# Patient Record
Sex: Male | Born: 1970
Health system: Southern US, Community
[De-identification: ages and names within clinical notes are randomized; demographics above are authoritative.]

## PROBLEM LIST (undated history)

## (undated) DIAGNOSIS — D649 Anemia, unspecified: Secondary | ICD-10-CM

## (undated) DIAGNOSIS — I2699 Other pulmonary embolism without acute cor pulmonale: Secondary | ICD-10-CM

## (undated) DIAGNOSIS — K219 Gastro-esophageal reflux disease without esophagitis: Secondary | ICD-10-CM

## (undated) DIAGNOSIS — E119 Type 2 diabetes mellitus without complications: Secondary | ICD-10-CM

## (undated) DIAGNOSIS — R6889 Other general symptoms and signs: Secondary | ICD-10-CM

## (undated) DIAGNOSIS — R7303 Prediabetes: Secondary | ICD-10-CM

## (undated) DIAGNOSIS — D759 Disease of blood and blood-forming organs, unspecified: Secondary | ICD-10-CM

## (undated) DIAGNOSIS — M1990 Unspecified osteoarthritis, unspecified site: Secondary | ICD-10-CM

## (undated) DIAGNOSIS — I82409 Acute embolism and thrombosis of unspecified deep veins of unspecified lower extremity: Secondary | ICD-10-CM

## (undated) DIAGNOSIS — R51 Headache: Secondary | ICD-10-CM

## (undated) DIAGNOSIS — M5430 Sciatica, unspecified side: Secondary | ICD-10-CM

## (undated) HISTORY — PX: KNEE ARTHROSCOPY: SUR90

## (undated) HISTORY — PX: OTHER SURGICAL HISTORY: SHX169

---

## 2001-10-06 ENCOUNTER — Encounter: Payer: Self-pay | Admitting: Emergency Medicine

## 2001-10-06 ENCOUNTER — Emergency Department (HOSPITAL_COMMUNITY): Admission: EM | Admit: 2001-10-06 | Discharge: 2001-10-06 | Payer: Self-pay | Admitting: Emergency Medicine

## 2002-10-05 ENCOUNTER — Emergency Department (HOSPITAL_COMMUNITY): Admission: EM | Admit: 2002-10-05 | Discharge: 2002-10-05 | Payer: Self-pay | Admitting: Emergency Medicine

## 2002-10-05 ENCOUNTER — Encounter: Payer: Self-pay | Admitting: Emergency Medicine

## 2005-02-02 ENCOUNTER — Ambulatory Visit (HOSPITAL_COMMUNITY): Admission: RE | Admit: 2005-02-02 | Discharge: 2005-02-02 | Payer: Self-pay | Admitting: Family Medicine

## 2005-03-07 ENCOUNTER — Ambulatory Visit (HOSPITAL_COMMUNITY): Admission: RE | Admit: 2005-03-07 | Discharge: 2005-03-07 | Payer: Self-pay | Admitting: Orthopedic Surgery

## 2005-03-07 ENCOUNTER — Ambulatory Visit (HOSPITAL_BASED_OUTPATIENT_CLINIC_OR_DEPARTMENT_OTHER): Admission: RE | Admit: 2005-03-07 | Discharge: 2005-03-07 | Payer: Self-pay | Admitting: Orthopedic Surgery

## 2005-07-04 ENCOUNTER — Ambulatory Visit (HOSPITAL_COMMUNITY): Admission: RE | Admit: 2005-07-04 | Discharge: 2005-07-04 | Payer: Self-pay | Admitting: Orthopedic Surgery

## 2005-07-04 ENCOUNTER — Ambulatory Visit (HOSPITAL_BASED_OUTPATIENT_CLINIC_OR_DEPARTMENT_OTHER): Admission: RE | Admit: 2005-07-04 | Discharge: 2005-07-04 | Payer: Self-pay | Admitting: Orthopedic Surgery

## 2007-09-05 ENCOUNTER — Emergency Department (HOSPITAL_COMMUNITY): Admission: EM | Admit: 2007-09-05 | Discharge: 2007-09-05 | Payer: Self-pay | Admitting: Emergency Medicine

## 2008-10-07 ENCOUNTER — Emergency Department (HOSPITAL_COMMUNITY): Admission: EM | Admit: 2008-10-07 | Discharge: 2008-10-07 | Payer: Self-pay | Admitting: Family Medicine

## 2009-11-09 ENCOUNTER — Emergency Department (HOSPITAL_COMMUNITY): Admission: EM | Admit: 2009-11-09 | Discharge: 2009-11-09 | Payer: Self-pay | Admitting: Emergency Medicine

## 2010-03-01 ENCOUNTER — Emergency Department (HOSPITAL_COMMUNITY): Admission: EM | Admit: 2010-03-01 | Discharge: 2010-03-01 | Payer: Self-pay | Admitting: Emergency Medicine

## 2010-03-01 ENCOUNTER — Observation Stay (HOSPITAL_COMMUNITY): Admission: EM | Admit: 2010-03-01 | Discharge: 2010-03-05 | Payer: Self-pay | Admitting: Emergency Medicine

## 2010-03-01 ENCOUNTER — Ambulatory Visit: Payer: Self-pay | Admitting: Cardiology

## 2010-03-02 ENCOUNTER — Encounter (INDEPENDENT_AMBULATORY_CARE_PROVIDER_SITE_OTHER): Payer: Self-pay

## 2010-03-04 ENCOUNTER — Ambulatory Visit: Payer: Self-pay | Admitting: Gastroenterology

## 2010-11-14 LAB — SODIUM, URINE, RANDOM: Sodium, Ur: <10 mEq/L

## 2010-11-14 LAB — BASIC METABOLIC PANEL
BUN: 4 mg/dL — ABNORMAL LOW (ref 6–23)
BUN: 6 mg/dL (ref 6–23)
Calcium: 8.1 mg/dL — ABNORMAL LOW (ref 8.4–10.5)
Calcium: 8.4 mg/dL (ref 8.4–10.5)
Calcium: 8.7 mg/dL (ref 8.4–10.5)
Creatinine, Ser: 1.14 mg/dL (ref 0.4–1.5)
Creatinine, Ser: 1.35 mg/dL (ref 0.4–1.5)
Creatinine, Ser: 2.3 mg/dL — ABNORMAL HIGH (ref 0.4–1.5)
GFR calc Af Amer: 39 mL/min — ABNORMAL LOW (ref >60)
GFR calc Af Amer: >60 mL/min (ref >60)
GFR calc Af Amer: >60 mL/min (ref >60)
GFR calc non Af Amer: 56 mL/min — ABNORMAL LOW (ref >60)
GFR calc non Af Amer: >60 mL/min (ref >60)
Glucose, Bld: 105 mg/dL — ABNORMAL HIGH (ref 70–99)
Potassium: 3.7 mEq/L (ref 3.5–5.1)
Sodium: 138 mEq/L (ref 135–145)

## 2010-11-14 LAB — GIARDIA/CRYPTOSPORIDIUM SCREEN(EIA)
Cryptosporidium Screen (EIA): NEGATIVE
Cryptosporidium Screen (EIA): NEGATIVE
Giardia Screen - EIA: NEGATIVE

## 2010-11-14 LAB — CBC
HCT: 50 % (ref 39.0–52.0)
Hemoglobin: 14.9 g/dL (ref 13.0–17.0)
Hemoglobin: 17 g/dL (ref 13.0–17.0)
MCH: 28.3 pg (ref 26.0–34.0)
MCHC: 34 g/dL (ref 30.0–36.0)
MCHC: 34 g/dL (ref 30.0–36.0)
MCV: 83.3 fL (ref 78.0–100.0)
Platelets: 354 10*3/uL (ref 150–400)
Platelets: 363 10*3/uL (ref 150–400)
RBC: 6 MIL/uL — ABNORMAL HIGH (ref 4.22–5.81)
RDW: 13.9 % (ref 11.5–15.5)
WBC: 9.2 10*3/uL (ref 4.0–10.5)

## 2010-11-14 LAB — DIFFERENTIAL
Basophils Absolute: 0 10*3/uL (ref 0.0–0.1)
Basophils Relative: 0 % (ref 0–1)
Eosinophils Absolute: 0 10*3/uL (ref 0.0–0.7)
Eosinophils Relative: 1 % (ref 0–5)
Lymphocytes Relative: 35 % (ref 12–46)
Lymphs Abs: 3.2 10*3/uL (ref 0.7–4.0)
Monocytes Absolute: 1.5 10*3/uL — ABNORMAL HIGH (ref 0.1–1.0)
Monocytes Relative: 16 % — ABNORMAL HIGH (ref 3–12)
Neutro Abs: 4.4 10*3/uL (ref 1.7–7.7)
Neutrophils Relative %: 48 % (ref 43–77)

## 2010-11-14 LAB — COMPREHENSIVE METABOLIC PANEL
ALT: 39 U/L (ref 0–53)
AST: 33 U/L (ref 0–37)
Albumin: 3.2 g/dL — ABNORMAL LOW (ref 3.5–5.2)
CO2: 25 mEq/L (ref 19–32)
Calcium: 8.5 mg/dL (ref 8.4–10.5)
GFR calc Af Amer: >60 mL/min (ref >60)
GFR calc non Af Amer: 52 mL/min — ABNORMAL LOW (ref >60)
Sodium: 136 mEq/L (ref 135–145)
Total Protein: 7.3 g/dL (ref 6.0–8.3)

## 2010-11-14 LAB — CARDIAC PANEL(CRET KIN+CKTOT+MB+TROPI)
Total CK: 310 U/L — ABNORMAL HIGH (ref 7–232)
Troponin I: 0.01 ng/mL (ref 0.00–0.06)

## 2010-11-14 LAB — CLOSTRIDIUM DIFFICILE EIA
C difficile Toxins A+B, EIA: NEGATIVE
C difficile Toxins A+B, EIA: NEGATIVE

## 2010-11-14 LAB — HEPATIC FUNCTION PANEL
ALT: 39 U/L (ref 0–53)
Alkaline Phosphatase: 56 U/L (ref 39–117)
Bilirubin, Direct: <0.1 mg/dL (ref 0.0–0.3)

## 2010-11-14 LAB — POCT I-STAT, CHEM 8
BUN: 23 mg/dL (ref 6–23)
Hemoglobin: 18.7 g/dL — ABNORMAL HIGH (ref 13.0–17.0)
Sodium: 131 mEq/L — ABNORMAL LOW (ref 135–145)
TCO2: 26 mmol/L (ref 0–100)

## 2011-01-03 ENCOUNTER — Emergency Department (HOSPITAL_COMMUNITY)
Admission: EM | Admit: 2011-01-03 | Discharge: 2011-01-03 | Disposition: A | Discharge status: Home / Self Care | Payer: BC Managed Care – PPO | Attending: Emergency Medicine | Admitting: Emergency Medicine

## 2011-01-03 DIAGNOSIS — R05 Cough: Secondary | ICD-10-CM | POA: Insufficient documentation

## 2011-01-03 DIAGNOSIS — R5381 Other malaise: Secondary | ICD-10-CM | POA: Insufficient documentation

## 2011-01-03 DIAGNOSIS — N390 Urinary tract infection, site not specified: Secondary | ICD-10-CM | POA: Insufficient documentation

## 2011-01-03 DIAGNOSIS — J069 Acute upper respiratory infection, unspecified: Secondary | ICD-10-CM | POA: Insufficient documentation

## 2011-01-03 DIAGNOSIS — R059 Cough, unspecified: Secondary | ICD-10-CM | POA: Insufficient documentation

## 2011-01-03 LAB — URINE MICROSCOPIC-ADD ON

## 2011-01-03 LAB — POCT I-STAT, CHEM 8
Chloride: 101 mEq/L (ref 96–112)
Glucose, Bld: 108 mg/dL — ABNORMAL HIGH (ref 70–99)
HCT: 48 % (ref 39.0–52.0)
Hemoglobin: 16.3 g/dL (ref 13.0–17.0)
Potassium: 4.2 mEq/L (ref 3.5–5.1)
Sodium: 138 mEq/L (ref 135–145)

## 2011-01-03 LAB — URINALYSIS, ROUTINE W REFLEX MICROSCOPIC
Glucose, UA: NEGATIVE mg/dL
Ketones, ur: NEGATIVE mg/dL
Protein, ur: NEGATIVE mg/dL
Urobilinogen, UA: 0.2 mg/dL (ref 0.0–1.0)

## 2011-01-04 LAB — URINE CULTURE

## 2011-01-14 NOTE — Op Note (Signed)
NAME:  Marcus Joseph, Marcus Joseph NO.:  [id number]   MEDICAL RECORD NO.:  [id number]          PATIENT TYPE:  AMB   LOCATION:  DSC                          FACILITY:  [facility]   PHYSICIAN:  Feliberto Gottron. Turner Daniels, M.D.   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   PROCEDURE:  Right knee arthroscopic removal loose bodies and chondromalacia  from the trochlea grade 3 with flap tears. Medial femoral condyle also grade  3 focal.   SURGEON:  Feliberto Gottron. Turner Daniels, M.D.   ASSISTANT:  None.   ANESTHETIC:  General LMA with Marcaine local.   ESTIMATED BLOOD LOSS:  Minimal.   FLUID REPLACEMENT:  800 mL crystalloid.   DRAINS PLACED:  None.   TOURNIQUET TIME:  None.   INDICATIONS FOR PROCEDURE:  A 40 year old gentleman has previously had knee  arthroscopy a few years ago and has recurrent pain, catching and popping in  his right knee and has failed conservative treatment with anti-inflammatory  medicines, physical therapy and activity modification. He is thought to have  loose bodies in his knee based on the mechanical catching, locking that has  been persistent over many months and desires elective arthroscopic  evaluation and treatment of his right knee. Risks and benefits of surgery  discussed previous to the procedure and all questions answered.   DESCRIPTION OF PROCEDURE:  The patient identified by armband, taken to the  operating room at [facility] where the appropriate anesthetic  monitors were attached. General LMA anesthesia induced with the patient in  supine position. Lateral posts applied to the table. Right lower extremity  prepped and draped in usual sterile fashion from the ankle to the midthigh.  Using the scars  from previous arthroscopy, standard portals were made 1.5 cm  inferomedial, inferolateral to the patella allowing introduction of the  arthroscope through the inferolateral portal and the outflow through the  inferomedial portal. We immediately encountered multiple cartilaginous loose  bodies somewhat to a centimeter in size in the joint fluid and these were  taken through the outflow and also piecemeal with 3.5 gator sucker shaver or  the pituitary rongeurs. The knee was continuously irrigated out. I would  estimate we took out about 20 loose bodies. The donor site was the trochlea  with flap tears. This was debrided back to stable margin with 3.5 gator  sucker shaver as was the distal aspect of medial femoral condyle. The  gutters were cleared medial and laterally. The ACL and PCL were intact. The  menisci were also  grossly intact. They were probed and found to be intact. At this point the  knee was irrigated out with normal saline solution. The arthroscopic  instruments removed and a dressing of Xeroform, 4x4 dressing, sponges,  Webril and Ace wrap applied. The patient was awakened and taken to the  recovery room without difficulty.  Feliberto Gottron. Turner Daniels, M.D.  Electronically Signed     FJR/MEDQ  D:  07/04/2005  T:  07/04/2005  Job:  [id number]

## 2011-01-14 NOTE — Op Note (Signed)
NAME:  Marcus Joseph, Marcus Joseph NO.:  [id number]   MEDICAL RECORD NO.:  [id number]          PATIENT TYPE:  AMB   LOCATION:  DSC                          FACILITY:  [facility]   PHYSICIAN:  Feliberto Gottron. Turner Daniels, M.D.   DATE OF BIRTH:  09-23-1970   DATE OF PROCEDURE:  03/07/2005  DATE OF DISCHARGE:                                 OPERATIVE REPORT   PREOPERATIVE DIAGNOSIS:  Right knee chondromalacia versus medial meniscal  tear.   POSTOPERATIVE DIAGNOSES:  1.  Right knee chondromalacia, trochlea grade 3, with flap tears; medial      femoral condyle, grade 3 flap tears.  2.  Multiple cartilaginous loose bodies.   PROCEDURE:  Right knee arthroscopic removal of loose bodies and debridement  of chondromalacia.   SURGEON:  Feliberto Gottron. Turner Daniels, M.D.   FIRST ASSISTANT:  Erskine Squibb B. Jannet Mantis.   ANESTHETIC:  Local with general LMA.   ESTIMATED BLOOD LOSS:  Minimal.   FLUID REPLACEMENT:  700 mL of crystalloid.   DRAINS PLACED:  None.   TOURNIQUET TIME:  None.   INDICATIONS FOR PROCEDURE:  A 40 year old man with significant catching,  popping and pain in his right knee, who has failed conservative treatment  with anti-inflammatory medicines, cortisone injection and exercise.  Because  of the persistent pain, swelling, catching and popping, a possible meniscal  tear on the MRI scan versus loose bodies, definite chondromalacia with an  effusion, he desires elective arthroscopic evaluation and treatment of his  right knee.  Risks and benefits of surgery discussed with him  preoperatively, questions were answered.   DESCRIPTION OF PROCEDURE:  The patient identified by armband, taken to the  operating room at [facility].  Appropriate anesthetic monitors  were attached and local anesthesia with general LMA anesthesia was induced  with the patient in a supine position.  A lateral post applied to the table  and the right lower extremity prepped and draped in the usual sterile  fashion from the ankle to the midthigh.  Using a #11 blade, standard  inferomedial, inferolateral peripatellar portals were then made allowing  introduction the arthroscope through the inferolateral portal and the  outflow through the inferomedial portal.  We immediately encountered  multiple cartilaginous loose bodies, which were continuously removed with  the pump throughout the procedure.  We identified chondromalacia of the  trochlea, grade 3 flap tears, which was debrided back to a stable margin  with a 3.5 gator sucker shaver as well as less impressive chondromalacia of  the patella.  Moving to the medial compartment, the medial meniscus was  probed and found to be intact.  Four or five more loose bodies were found,  some of them underneath the medial meniscus.  These were removed and  chondromalacia, grade 3 with flap tears, of the posterior aspect of the  medial femoral condyle was identified and debrided as well.  The trochlea  and the posterior medial femoral condyle were the donor sites for the loose  bodies.  The ACL and the PCL were intact.  The lateral compartment was in  excellent condition except  for two more loose bodies, which were  photographed and removed.  We found another one in the lateral gutter.  This  was also removed.  Most of these could be taken through the outflow.  The  knee was then thoroughly irrigated out with normal saline solution.  More  photographs were made documenting removal of the loose bodies, and the  arthroscopic instruments were then removed.  A dressing of Xeroform. 4x4  dressing sponges, Webril and an Ace wrap applied.  The patient was then  awakened and taken to the recovery room without difficulty.       FJR/MEDQ  D:  03/07/2005  T:  03/07/2005  Job:  [id number]

## 2011-09-07 ENCOUNTER — Encounter (HOSPITAL_COMMUNITY): Payer: Self-pay

## 2011-09-07 ENCOUNTER — Emergency Department (HOSPITAL_COMMUNITY)
Admission: EM | Admit: 2011-09-07 | Discharge: 2011-09-07 | Disposition: A | Discharge status: Home / Self Care | Payer: BC Managed Care – PPO | Source: Home / Self Care | Attending: Family Medicine | Admitting: Family Medicine

## 2011-09-07 DIAGNOSIS — H6990 Unspecified Eustachian tube disorder, unspecified ear: Secondary | ICD-10-CM

## 2011-09-07 DIAGNOSIS — H6980 Other specified disorders of Eustachian tube, unspecified ear: Secondary | ICD-10-CM

## 2011-09-07 MED ORDER — FLUTICASONE PROPIONATE 50 MCG/ACT NA SUSP: 2.0000 | Freq: Every day | NASAL | Status: DC

## 2011-09-07 NOTE — ED Notes (Signed)
C/o recent URI, complicated by ear pressure, fullness, and dizziness; minimal relief w OTC meds

## 2011-09-07 NOTE — ED Provider Notes (Signed)
History     CSN: [id number]  Arrival date & time 09/07/11  1031   First MD Initiated Contact with Patient 09/07/11 1146      Chief Complaint  Patient presents with  . Ear Fullness    (Consider location/radiation/quality/duration/timing/severity/associated sxs/prior treatment) HPI Comments: Uri presents for evaluation of bilateral ear fullness over the last week. He has tried OTC preparations such as Alka-Seltzer, Zicam, and Sudafed without relief. He denies any hx of seasonal allergies.   Patient is a 41 y.o. male presenting with plugged ear sensation. The history is provided by the patient.  Ear Fullness This is a new problem. The current episode started more than 2 days ago. The problem occurs constantly. The problem has not changed since onset.The symptoms are aggravated by nothing. The symptoms are relieved by nothing. The treatment provided mild relief.    History reviewed. No pertinent past medical history.  History reviewed. No pertinent past surgical history.  History reviewed. No pertinent family history.  History  Substance Use Topics  . Smoking status: Never Smoker   . Smokeless tobacco: Not on file  . Alcohol Use: Yes      Review of Systems  Constitutional: Negative.   HENT: Positive for hearing loss and congestion. Negative for ear pain and ear discharge.   Eyes: Negative.   Respiratory: Negative.   Cardiovascular: Negative.   Gastrointestinal: Negative.   Genitourinary: Negative.   Musculoskeletal: Negative.   Skin: Negative.   Neurological: Negative.     Allergies  Review of patient's allergies indicates no known allergies.  Home Medications   Current Outpatient Rx  Name Route Sig Dispense Refill  . FLUTICASONE PROPIONATE 50 MCG/ACT NA SUSP Nasal Place 2 sprays into the nose daily. 16 g 2    BP 127/72  Pulse 89  Temp(Src) 98.3 F (36.8 C) (Oral)  Resp 18  SpO2 99%  Physical Exam  Nursing note and vitals reviewed. Constitutional:  He is oriented to person, place, and time. He appears well-developed and well-nourished.  HENT:  Head: Normocephalic and atraumatic.  Right Ear: Tympanic membrane is retracted. Tympanic membrane is not erythematous.  Left Ear: Tympanic membrane is retracted. Tympanic membrane is not erythematous.  Mouth/Throat: Uvula is midline, oropharynx is clear and moist and mucous membranes are normal.  Eyes: EOM are normal.  Neck: Normal range of motion.  Pulmonary/Chest: Effort normal and breath sounds normal. He has no wheezes. He has no rhonchi.  Musculoskeletal: Normal range of motion.  Neurological: He is alert and oriented to person, place, and time.  Skin: Skin is warm and dry.  Psychiatric: His behavior is normal.    ED Course  Procedures (including critical care time)  Labs Reviewed - No data to display No results found.   1. Eustachian tube dysfunction       MDM  rx given for fluticasone        Richardo Priest, MD 09/07/11 1213

## 2012-08-06 ENCOUNTER — Emergency Department (HOSPITAL_COMMUNITY)
Admission: EM | Admit: 2012-08-06 | Discharge: 2012-08-06 | Disposition: A | Discharge status: Home / Self Care | Payer: BC Managed Care – PPO | Source: Home / Self Care | Attending: Emergency Medicine | Admitting: Emergency Medicine

## 2012-08-06 ENCOUNTER — Emergency Department (INDEPENDENT_AMBULATORY_CARE_PROVIDER_SITE_OTHER): Payer: BC Managed Care – PPO

## 2012-08-06 ENCOUNTER — Encounter (HOSPITAL_COMMUNITY): Payer: Self-pay | Admitting: Emergency Medicine

## 2012-08-06 DIAGNOSIS — M25569 Pain in unspecified knee: Secondary | ICD-10-CM

## 2012-08-06 DIAGNOSIS — M25562 Pain in left knee: Secondary | ICD-10-CM

## 2012-08-06 MED ORDER — MELOXICAM 7.5 MG PO TABS: 7.5000 mg | ORAL_TABLET | Freq: Every day | ORAL | Status: DC

## 2012-08-06 MED ORDER — HYDROCODONE-ACETAMINOPHEN 5-325 MG PO TABS: 2.0000 | ORAL_TABLET | ORAL | Status: DC | PRN

## 2012-08-06 NOTE — ED Notes (Addendum)
Bilateral knee pain x 3 weeks, scale of 8 with knee braces and 10 w/o braces... Pain keeps him up at night and wears braces to help sleep. Patient taking ibuprofen without relief. bp

## 2012-08-06 NOTE — ED Provider Notes (Signed)
History     CSN: [id number]  Arrival date & time 08/06/12  1107   First MD Initiated Contact with Patient 08/06/12 1157      Chief Complaint  Patient presents with  . Knee Pain    (Consider location/radiation/quality/duration/timing/severity/associated sxs/prior treatment) Patient is a 41 y.o. male presenting with knee pain. The history is provided by the patient. No language interpreter was used.  Knee Pain This is a new problem. Episode onset: 3 weeks. The problem occurs constantly. The problem has been gradually worsening. The symptoms are aggravated by walking. Nothing relieves the symptoms. He has tried rest for the symptoms. The treatment provided no relief.   Pt has had problems with both knees.  Pt reports left knee is swollen and painful.   Pt reports he has been wearing a knee brace without relief History reviewed. No pertinent past medical history.  Past Surgical History  Procedure Date  . Knee arthroscopy     No family history on file.  History  Substance Use Topics  . Smoking status: Never Smoker   . Smokeless tobacco: Not on file  . Alcohol Use: Yes      Review of Systems  Musculoskeletal: Positive for myalgias and joint swelling.  All other systems reviewed and are negative.    Allergies  Review of patient's allergies indicates no known allergies.  Home Medications   Current Outpatient Rx  Name  Route  Sig  Dispense  Refill  . IBUPROFEN 100 MG PO TABS   Oral   Take 100 mg by mouth every 6 (six) hours as needed.         Marland Kitchen FLUTICASONE PROPIONATE 50 MCG/ACT NA SUSP   Nasal   Place 2 sprays into the nose daily.   16 g   2     BP 144/85  Pulse 81  Temp 98.8 F (37.1 C) (Oral)  Resp 20  SpO2 99%  Physical Exam  Nursing note and vitals reviewed. Constitutional: He is oriented to person, place, and time. He appears well-developed and well-nourished.  HENT:  Head: Normocephalic and atraumatic.  Eyes: Pupils are equal, round, and  reactive to light.  Musculoskeletal: He exhibits tenderness.       Swollen left knee, decreased range of motion,  nv and ns intact  No instability  Neurological: He is alert and oriented to person, place, and time. He has normal reflexes.  Skin: Skin is warm.  Psychiatric: He has a normal mood and affect.    ED Course  Procedures (including critical care time)  Labs Reviewed - No data to display Dg Knee Complete 4 Views Left  08/06/2012  *RADIOLOGY REPORT*  Clinical Data: Left knee injury and pain.  LEFT KNEE - COMPLETE 4+ VIEW  Comparison: None.  Findings: No evidence of acute fracture or dislocation.  No definite evidence of knee joint effusion.  Mild osteoarthritis is seen involving the medial and patellofemoral compartments.  No other significant bone abnormality identified.  IMPRESSION:  1.  No acute findings. 2.  Mild medial and patellofemoral compartment osteoarthritis.   Original Report Authenticated By: Myles Rosenthal, M.D.      1. Knee pain, left       MDM  Pt advised to follow up with Dr. Rayburn Ma,  Wear brace,  meloxicam and hydrocodone for symptoms        Elson Areas, [place] 08/06/12 1328

## 2012-08-06 NOTE — ED Provider Notes (Signed)
Medical screening examination/treatment/procedure(s) were performed by non-physician practitioner and as supervising physician I was immediately available for consultation/collaboration.  Leslee Home, M.D.   Reuben Likes, MD 08/06/12 [phone]

## 2012-08-17 ENCOUNTER — Other Ambulatory Visit: Payer: Self-pay | Admitting: Orthopaedic Surgery

## 2012-08-17 DIAGNOSIS — M25562 Pain in left knee: Secondary | ICD-10-CM

## 2012-08-17 DIAGNOSIS — R531 Weakness: Secondary | ICD-10-CM

## 2012-08-25 ENCOUNTER — Ambulatory Visit
Admission: RE | Admit: 2012-08-25 | Discharge: 2012-08-25 | Disposition: A | Discharge status: Home / Self Care | Payer: BC Managed Care – PPO | Source: Ambulatory Visit | Attending: Orthopaedic Surgery | Admitting: Orthopaedic Surgery

## 2012-08-25 DIAGNOSIS — M25562 Pain in left knee: Secondary | ICD-10-CM

## 2012-08-25 DIAGNOSIS — R531 Weakness: Secondary | ICD-10-CM

## 2012-09-13 ENCOUNTER — Encounter (HOSPITAL_COMMUNITY): Payer: Self-pay | Admitting: Emergency Medicine

## 2012-09-13 ENCOUNTER — Emergency Department (INDEPENDENT_AMBULATORY_CARE_PROVIDER_SITE_OTHER)
Admission: EM | Admit: 2012-09-13 | Discharge: 2012-09-13 | Disposition: A | Discharge status: Home / Self Care | Payer: BC Managed Care – PPO | Source: Home / Self Care | Attending: Emergency Medicine | Admitting: Emergency Medicine

## 2012-09-13 DIAGNOSIS — L03221 Cellulitis of neck: Secondary | ICD-10-CM

## 2012-09-13 DIAGNOSIS — L0211 Cutaneous abscess of neck: Secondary | ICD-10-CM

## 2012-09-13 MED ORDER — DOXYCYCLINE HYCLATE 100 MG PO CAPS: 100.0000 mg | ORAL_CAPSULE | Freq: Two times a day (BID) | ORAL | Status: AC

## 2012-09-13 NOTE — ED Notes (Signed)
Pt c/o boil on back of neck x 3 days. Hot to touch. Denies any drainage. Pt states that he tried to drain it but nothing came out. Pt using ibuprofen for pain with mild relief.

## 2012-09-13 NOTE — ED Provider Notes (Signed)
History     CSN: [id number]  Arrival date & time 09/13/12  1606   First MD Initiated Contact with Patient 09/13/12 1607      Chief Complaint  Patient presents with  . Recurrent Skin Infections    boil on back of neck x 3 days. swelling. denies drainage. hot to touch.     (Consider location/radiation/quality/duration/timing/severity/associated sxs/prior treatment) HPI Comments: Patient presents urgent care this evening complaining of develop a ball in the back of his neck for the last 3-4 days. Is hot and tender to touch. He denies any spontaneous drainage although he has been trying to drain it by exerting a lot of pressure on it but nothing comes out. He has been taking some Motrin for the pain and swelling. Denies any fevers or chills denies any previous infections in the same area before.  The history is provided by the patient.    History reviewed. No pertinent past medical history.  Past Surgical History  Procedure Date  . Knee arthroscopy     History reviewed. No pertinent family history.  History  Substance Use Topics  . Smoking status: Never Smoker   . Smokeless tobacco: Not on file  . Alcohol Use: Yes     Comment: occasional      Review of Systems  Constitutional: Negative for fever, chills, activity change, appetite change and fatigue.  Skin: Positive for color change and wound.    Allergies  Review of patient's allergies indicates no known allergies.  Home Medications   Current Outpatient Rx  Name  Route  Sig  Dispense  Refill  . IBUPROFEN 100 MG PO TABS   Oral   Take 100 mg by mouth every 6 (six) hours as needed.         Marland Kitchen DOXYCYCLINE HYCLATE 100 MG PO CAPS   Oral   Take 1 capsule (100 mg total) by mouth 2 (two) times daily.   20 capsule   0   . FLUTICASONE PROPIONATE 50 MCG/ACT NA SUSP   Nasal   Place 2 sprays into the nose daily.   16 g   2   . HYDROCODONE-ACETAMINOPHEN 5-325 MG PO TABS   Oral   Take 2 tablets by mouth every 4  (four) hours as needed for pain.   16 tablet   0   . MELOXICAM 7.5 MG PO TABS   Oral   Take 1 tablet (7.5 mg total) by mouth daily.   14 tablet   1     BP 145/83  Pulse 75  Temp 98.4 F (36.9 C) (Oral)  Resp 18  SpO2 96%  Physical Exam  Nursing note and vitals reviewed. Constitutional: Vital signs are normal. He appears well-developed.  Non-toxic appearance. He does not have a sickly appearance. He does not appear ill. No distress.  Neck: Neck supple. No JVD present.    Lymphadenopathy:    He has no cervical adenopathy.  Skin: Skin is warm. There is erythema.    ED Course  INCISION AND DRAINAGE Performed by: Milea Klink Authorized by: Jimmie Molly Consent: Verbal consent obtained. Risks and benefits: risks, benefits and alternatives were discussed Consent given by: patient Patient understanding: patient states understanding of the procedure being performed Patient identity confirmed: verbally with patient Type: abscess Body area: head/neck Anesthesia: local infiltration Local anesthetic: lidocaine 1% with epinephrine Anesthetic total: 5 ml Patient sedated: no Scalpel size: 11 Needle gauge: 22 Incision type: single straight Complexity: simple Drainage: purulent Drainage amount: moderate Wound  treatment: wound left open Packing material: 1/2 in iodoform gauze Patient tolerance: Patient tolerated the procedure well with no immediate complications.   (including critical care time)   Labs Reviewed  CULTURE, ROUTINE-ABSCESS   No results found.   1. Abscess of neck       MDM  Posterior cervical- furuncles and abscesses. Patient had an I&D ( sample collected for cultures)- left a packing- patient started on doxycycline otherwise return in 48 hours for packing removal and abscess recheck. Patient agrees with treatment plan and followup care as necessary tolerated procedure well.        Jimmie Molly, MD 09/13/12 [phone]

## 2012-09-15 ENCOUNTER — Emergency Department (INDEPENDENT_AMBULATORY_CARE_PROVIDER_SITE_OTHER)
Admission: EM | Admit: 2012-09-15 | Discharge: 2012-09-15 | Disposition: A | Discharge status: Home / Self Care | Payer: BC Managed Care – PPO | Source: Home / Self Care | Attending: Emergency Medicine | Admitting: Emergency Medicine

## 2012-09-15 ENCOUNTER — Encounter (HOSPITAL_COMMUNITY): Payer: Self-pay | Admitting: Emergency Medicine

## 2012-09-15 DIAGNOSIS — L0390 Cellulitis, unspecified: Secondary | ICD-10-CM

## 2012-09-15 DIAGNOSIS — L0291 Cutaneous abscess, unspecified: Secondary | ICD-10-CM

## 2012-09-15 NOTE — ED Notes (Signed)
Pt is here for a f/u and to have packing removed Packing fell out this am Reports some drainage and feeling much better Still taking antibiotics and tolerating well  He is alert w/no signs of acute distress.

## 2012-09-15 NOTE — ED Provider Notes (Signed)
Chief Complaint  Patient presents with  . Follow-up    History of Present Illness:    Marcus Joseph is a 42 year old male who returns today for removal of packing from an abscess on his posterior neck. He had an abscess that was incised and drained 2 days ago. The packing fell out on its own. It feels a lot better. It's been draining a small amount of pus. It's mildly tender to palpation. He denies any fever or chills. He denies any prior history of abscesses, skin infections, or MRSA.  Review of Systems:  Other than noted above, the patient denies any of the following symptoms: Systemic:  No fever, chills or sweats. Skin:  No rash or itching.  PMFSH:  Past medical history, family history, social history, meds, and allergies were reviewed.  No history of diabetes or prior history of abscesses or MRSA.  Physical Exam:   Vital signs:  BP 155/95  Pulse 97  Temp 98.3 F (36.8 C) (Oral)  Resp 17  SpO2 99% Skin:  There is an abscess on his posterior neck that is been incised and drained in packing has fallen out. There still a slight induration surrounding it and mild tenderness to palpation but no purulent drainage.  Skin exam was otherwise normal.  No rash. Ext:  Distal pulses were full, patient has full ROM of all joints.  Procedure:  Verbal informed consent was obtained.  The patient was informed of the risks and benefits of the procedure and understands and accepts.  Identity of the patient was verified verbally and by wristband.   The abscess area described above was cleansed with saline and antibiotic ointment was applied followed by a clean, dry, sterile dressing. The patient was instructed in wound care.  Assessment:  The encounter diagnosis was Abscess.  Plan:   1.  The following meds were prescribed:   New Prescriptions   No medications on file   2.  The patient was instructed in symptomatic care and handouts were given. 3.  The patient was instructed in routine wound  care.   Reuben Likes, MD 09/15/12 [phone]

## 2012-09-17 LAB — CULTURE, ROUTINE-ABSCESS
Culture: NO GROWTH
Gram Stain: NONE SEEN

## 2013-09-23 ENCOUNTER — Encounter (HOSPITAL_BASED_OUTPATIENT_CLINIC_OR_DEPARTMENT_OTHER): Payer: Self-pay | Admitting: Emergency Medicine

## 2013-09-23 ENCOUNTER — Emergency Department (HOSPITAL_BASED_OUTPATIENT_CLINIC_OR_DEPARTMENT_OTHER)
Admission: EM | Admit: 2013-09-23 | Discharge: 2013-09-23 | Disposition: A | Discharge status: Home / Self Care | Payer: BC Managed Care – PPO | Attending: Emergency Medicine | Admitting: Emergency Medicine

## 2013-09-23 DIAGNOSIS — J209 Acute bronchitis, unspecified: Secondary | ICD-10-CM

## 2013-09-23 DIAGNOSIS — R509 Fever, unspecified: Secondary | ICD-10-CM | POA: Insufficient documentation

## 2013-09-23 DIAGNOSIS — Z791 Long term (current) use of non-steroidal anti-inflammatories (NSAID): Secondary | ICD-10-CM | POA: Insufficient documentation

## 2013-09-23 MED ORDER — GUAIFENESIN-CODEINE 100-10 MG/5ML PO SOLN: 10.0000 mL | Freq: Four times a day (QID) | ORAL | Status: DC | PRN

## 2013-09-23 MED ORDER — AZITHROMYCIN 250 MG PO TABS: ORAL_TABLET | ORAL | Status: DC

## 2013-09-23 NOTE — ED Notes (Signed)
Pt complains of cough for about 1.5 weeks ago. Pt reports has coughing spells and can't stop coughing.  Pt reports at the beginning of the cough had fevers but not its down to 99.  Complains of chest congestion and productive cough with yellow sputum.

## 2013-09-23 NOTE — Discharge Instructions (Signed)
Zithromax as prescribed for the next 5 days.  Robitussin with codeine as prescribed as needed for cough.  Return to the ER if you develop severe chest pain or worsening of your breathing.   Acute Bronchitis Bronchitis is inflammation of the airways that extend from the windpipe into the lungs (bronchi). The inflammation often causes mucus to develop. This leads to a cough, which is the most common symptom of bronchitis.  In acute bronchitis, the condition usually develops suddenly and goes away over time, usually in a couple weeks. Smoking, allergies, and asthma can make bronchitis worse. Repeated episodes of bronchitis may cause further lung problems.  CAUSES Acute bronchitis is most often caused by the same virus that causes a cold. The virus can spread from person to person (contagious).  SIGNS AND SYMPTOMS   Cough.   Fever.   Coughing up mucus.   Body aches.   Chest congestion.   Chills.   Shortness of breath.   Sore throat.  DIAGNOSIS  Acute bronchitis is usually diagnosed through a physical exam. Tests, such as chest X-rays, are sometimes done to rule out other conditions.  TREATMENT  Acute bronchitis usually goes away in a couple weeks. Often times, no medical treatment is necessary. Medicines are sometimes given for relief of fever or cough. Antibiotics are usually not needed but may be prescribed in certain situations. In some cases, an inhaler may be recommended to help reduce shortness of breath and control the cough. A cool mist vaporizer may also be used to help thin bronchial secretions and make it easier to clear the chest.  HOME CARE INSTRUCTIONS  Get plenty of rest.   Drink enough fluids to keep your urine clear or pale yellow (unless you have a medical condition that requires fluid restriction). Increasing fluids may help thin your secretions and will prevent dehydration.   Only take over-the-counter or prescription medicines as directed by your  health care provider.   Avoid smoking and secondhand smoke. Exposure to cigarette smoke or irritating chemicals will make bronchitis worse. If you are a smoker, consider using nicotine gum or skin patches to help control withdrawal symptoms. Quitting smoking will help your lungs heal faster.   Reduce the chances of another bout of acute bronchitis by washing your hands frequently, avoiding people with cold symptoms, and trying not to touch your hands to your mouth, nose, or eyes.   Follow up with your health care provider as directed.  SEEK MEDICAL CARE IF: Your symptoms do not improve after 1 week of treatment.  SEEK IMMEDIATE MEDICAL CARE IF:  You develop an increased fever or chills.   You have chest pain.   You have severe shortness of breath.  You have bloody sputum.   You develop dehydration.  You develop fainting.  You develop repeated vomiting.  You develop a severe headache. MAKE SURE YOU:   Understand these instructions.  Will watch your condition.  Will get help right away if you are not doing well or get worse. Document Released: 09/22/2004 Document Revised: 04/17/2013 Document Reviewed: 02/05/2013 [facility] Patient Information 2014 [place], [place].

## 2013-09-23 NOTE — ED Provider Notes (Signed)
CSN: [id number]     Arrival date & time 09/23/13  0349 History   First MD Initiated Contact with Patient 09/23/13 0408     Chief Complaint  Patient presents with  . Cough   (Consider location/radiation/quality/duration/timing/severity/associated sxs/prior Treatment) HPI Comments: Patient is a 43 year old male with a 10 day history of chest congestion, productive cough.  He has had no relief with otc meds and time.  No chest pain or shortness of breath.  Patient is a 43 y.o. male presenting with cough. The history is provided by the patient.  Cough Cough characteristics:  Productive Sputum characteristics:  Green Severity:  Moderate Onset quality:  Gradual Duration:  10 days Timing:  Constant Progression:  Worsening Chronicity:  New Smoker: no   Relieved by:  Nothing Worsened by:  Nothing tried Ineffective treatments:  None tried Associated symptoms: fever   Associated symptoms: no chills     History reviewed. No pertinent past medical history. Past Surgical History  Procedure Laterality Date  . Knee arthroscopy     No family history on file. History  Substance Use Topics  . Smoking status: Never Smoker   . Smokeless tobacco: Not on file  . Alcohol Use: Yes     Comment: occasional    Review of Systems  Constitutional: Positive for fever. Negative for chills.  Respiratory: Positive for cough.   All other systems reviewed and are negative.    Allergies  Review of patient's allergies indicates no known allergies.  Home Medications   Current Outpatient Rx  Name  Route  Sig  Dispense  Refill  . EXPIRED: fluticasone (FLONASE) 50 MCG/ACT nasal spray   Nasal   Place 2 sprays into the nose daily.   16 g   2   . HYDROcodone-acetaminophen (NORCO/VICODIN) 5-325 MG per tablet   Oral   Take 2 tablets by mouth every 4 (four) hours as needed for pain.   16 tablet   0   . ibuprofen (ADVIL,MOTRIN) 100 MG tablet   Oral   Take 100 mg by mouth every 6 (six) hours as  needed.         . meloxicam (MOBIC) 7.5 MG tablet   Oral   Take 1 tablet (7.5 mg total) by mouth daily.   14 tablet   1    BP 156/85  Pulse 107  Temp(Src) 99 F (37.2 C) (Oral)  Resp 18  Ht 6' (1.829 m)  Wt 298 lb (135.172 kg)  BMI 40.41 kg/m2  SpO2 99% Physical Exam  Nursing note and vitals reviewed. Constitutional: He is oriented to person, place, and time. He appears well-developed and well-nourished. No distress.  HENT:  Head: Normocephalic and atraumatic.  Mouth/Throat: Oropharynx is clear and moist.  Neck: Normal range of motion. Neck supple.  Cardiovascular: Normal rate, regular rhythm and normal heart sounds.   No murmur heard. Pulmonary/Chest: Effort normal and breath sounds normal. No respiratory distress. He has no wheezes.  Abdominal: Soft. Bowel sounds are normal. He exhibits no distension. There is no tenderness.  Musculoskeletal: Normal range of motion. He exhibits no edema.  Lymphadenopathy:    He has no cervical adenopathy.  Neurological: He is alert and oriented to person, place, and time.  Skin: Skin is warm and dry. He is not diaphoretic.    ED Course  Procedures (including critical care time) Labs Review Labs Reviewed - No data to display Imaging Review No results found.    MDM  No diagnosis found. Will  treat with zmax, robitussin ac.  Return prn.    Geoffery Lyons, MD 09/23/13 [phone]

## 2013-12-13 ENCOUNTER — Emergency Department (HOSPITAL_BASED_OUTPATIENT_CLINIC_OR_DEPARTMENT_OTHER)
Admission: EM | Admit: 2013-12-13 | Discharge: 2013-12-13 | Disposition: A | Discharge status: Home / Self Care | Payer: BC Managed Care – PPO | Attending: Emergency Medicine | Admitting: Emergency Medicine

## 2013-12-13 ENCOUNTER — Emergency Department (HOSPITAL_BASED_OUTPATIENT_CLINIC_OR_DEPARTMENT_OTHER): Payer: BC Managed Care – PPO

## 2013-12-13 ENCOUNTER — Encounter (HOSPITAL_BASED_OUTPATIENT_CLINIC_OR_DEPARTMENT_OTHER): Payer: Self-pay | Admitting: Emergency Medicine

## 2013-12-13 DIAGNOSIS — Z792 Long term (current) use of antibiotics: Secondary | ICD-10-CM | POA: Insufficient documentation

## 2013-12-13 DIAGNOSIS — Y9289 Other specified places as the place of occurrence of the external cause: Secondary | ICD-10-CM | POA: Insufficient documentation

## 2013-12-13 DIAGNOSIS — Z791 Long term (current) use of non-steroidal anti-inflammatories (NSAID): Secondary | ICD-10-CM | POA: Insufficient documentation

## 2013-12-13 DIAGNOSIS — R7309 Other abnormal glucose: Secondary | ICD-10-CM | POA: Insufficient documentation

## 2013-12-13 DIAGNOSIS — S99929A Unspecified injury of unspecified foot, initial encounter: Principal | ICD-10-CM

## 2013-12-13 DIAGNOSIS — R739 Hyperglycemia, unspecified: Secondary | ICD-10-CM

## 2013-12-13 DIAGNOSIS — IMO0002 Reserved for concepts with insufficient information to code with codable children: Secondary | ICD-10-CM | POA: Insufficient documentation

## 2013-12-13 DIAGNOSIS — S99919A Unspecified injury of unspecified ankle, initial encounter: Principal | ICD-10-CM

## 2013-12-13 DIAGNOSIS — Y9389 Activity, other specified: Secondary | ICD-10-CM | POA: Insufficient documentation

## 2013-12-13 DIAGNOSIS — X500XXA Overexertion from strenuous movement or load, initial encounter: Secondary | ICD-10-CM | POA: Insufficient documentation

## 2013-12-13 DIAGNOSIS — S8990XA Unspecified injury of unspecified lower leg, initial encounter: Secondary | ICD-10-CM | POA: Insufficient documentation

## 2013-12-13 DIAGNOSIS — S8992XA Unspecified injury of left lower leg, initial encounter: Secondary | ICD-10-CM

## 2013-12-13 LAB — BASIC METABOLIC PANEL
BUN: 10 mg/dL (ref 6–23)
CHLORIDE: 98 meq/L (ref 96–112)
CO2: 27 meq/L (ref 19–32)
Calcium: 9.3 mg/dL (ref 8.4–10.5)
Creatinine, Ser: 0.9 mg/dL (ref 0.50–1.35)
GFR calc Af Amer: >90 mL/min (ref >90)
GFR calc non Af Amer: >90 mL/min (ref >90)
GLUCOSE: 215 mg/dL — AB (ref 70–99)
POTASSIUM: 4.2 meq/L (ref 3.7–5.3)
SODIUM: 139 meq/L (ref 137–147)

## 2013-12-13 MED ORDER — HYDROCODONE-ACETAMINOPHEN 5-325 MG PO TABS
2.0000 | ORAL_TABLET | Freq: Once | ORAL | Status: AC
  Administered 2013-12-13: 2 via ORAL
  Filled 2013-12-13: qty 2

## 2013-12-13 MED ORDER — IOHEXOL 350 MG/ML SOLN
125.0000 mL | Freq: Once | INTRAVENOUS | Status: AC | PRN
  Administered 2013-12-13: 125 mL via INTRAVENOUS

## 2013-12-13 MED ORDER — IBUPROFEN 800 MG PO TABS: 800.0000 mg | ORAL_TABLET | Freq: Three times a day (TID) | ORAL | Status: DC

## 2013-12-13 MED ORDER — OXYCODONE-ACETAMINOPHEN 5-325 MG PO TABS: 2.0000 | ORAL_TABLET | ORAL | Status: DC | PRN

## 2013-12-13 NOTE — ED Provider Notes (Signed)
CSN: [id number]     Arrival date & time 12/13/13  1200 History  This chart was scribed for Glynn OctaveStephen Jourdin Gens, MD by Shari HeritageAisha Amuda, ED Scribe. The patient was seen in room MH12/MH12. Patient's care was started at 12:41 PM.  Chief Complaint  Patient presents with  . Knee Pain    The history is provided by the patient. No language interpreter was used.    HPI Comments: Marcus Joseph is a [age] y.o. 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 He states that last night when he got up to go to the bathroom, his knee "buckled." He states that this morning when he got up, he was unable to bend or straighten his knee and states that it is "stuck." He denies any recent falls, injury or trauma to the knee. He took ibuprofen last night, but he hasn't had any this morning. [facility] Dr. Rodolph BongAdam Kendall. He denies any significant pain in his right knee. Patient states that his knee surgery was done by Dr. Magn[facility]Black[facility] of [facility]. He does not take any medications on a regular basis. Patient does not smoke.   History reviewed. No pertinent past medical history. Past Surgical History  Procedure Laterality Date  . Knee arthroscopy     History reviewed. No pertinent family history. History  Substance Use Topics  . Smoking status: Never Smoker   . Smokeless tobacco: Not on file  . Alcohol Use: Yes     Comment: occasional    Review of Systems A complete 10 system review of systems was obtained and all systems are negative except as noted in the HPI and PMH.    Allergies  Review of patient's allergies indicates no known allergies.  Home Medications   Prior to Admission medications   Medication Sig Start Date End Date Taking? Authorizing Provider  azithromycin  (ZITHROMAX Z-PAK) 250 MG tablet 2 po day one, then 1 daily x 4 days 09/23/13   Geoffery Lyonsouglas Delo, MD  fluticasone [facility](FLONASE) 50 MCG/ACT nasal spray Place 2 sprays into the nose daily. 09/07/11 09/06/12  Delanna Noticeonald Laney, MD  guaiFENesin-codeine 100-10 MG/5ML syrup Take 10 mLs by mouth every 6 (six) hours as needed for cough. 09/23/13   Geoffery Lyonsouglas Delo, MD  HYDROcodone-acetaminophen (NORCO/VICODIN) 5-325 MG per tablet Take 2 tablets by mouth every 4 (four) hours as needed for pain. 08/06/12   Elson AreasLeslie K Sofia, PA-C  ibuprofen (ADVIL,MOTRIN) 100 MG tablet Take 100 mg by mouth every 6 (six) hours as needed.    Historical Provider, MD  meloxicam (MOBIC) 7.5 MG tablet Take 1 tablet (7.5 mg total) by mouth daily. 08/06/12   Elson AreasLeslie K Sofia, PA-C   Triage Vitals: BP 131/76  Pulse 76  Temp(Src) 98.4 F (36.9 C) (Oral)  Resp 18  Ht 5\' 11"  (1.803 m)  Wt 310 lb (140.615 kg)  BMI 43.26 kg/m2  SpO2 99% Physical Exam  Constitutional: He is oriented to person, place, and time. He appears well-developed and well-nourished. No distress.  HENT:  Head: Normocephalic and atraumatic.  Eyes: Conjunctivae and EOM are normal.  Neck: Normal range of motion. Neck supple.  Cardiovascular: Normal rate, regular rhythm and normal heart sounds.  Exam reveals no gallop and no friction rub.   No murmur heard. Pulmonary/Chest: Effort normal and breath sounds normal.  No respiratory distress. He has no wheezes. He has no rales.  Abdominal: Soft. There is no tenderness.  Musculoskeletal: Normal range of motion.  Left knee held in semi-flex position approximately 20 degrees from horizontal. Compartment soft. Small knee effusion. Patient able to flex knee but not straighten to horizontal. palpable DP and PT pulses. Compartments soft.   Neurological: He is alert and oriented to person, place, and time.  Skin: Skin is warm and dry. No rash noted.  Psychiatric: He has a normal mood and affect. His behavior is normal.    ED Course  Procedures  (including critical care time)  COORDINATION OF CARE: 12:50 PM- Will order Norco and x-ray of knee. Patient informed of current plan for treatment and evaluation and agrees with plan at this time.   2:53 PM - Knee x-ray shows arthritis, but still suspicion for dislocation so will order CT angio of left lower extremity.    Labs Review Labs Reviewed  BASIC METABOLIC PANEL - Abnormal; Notable for the following:    Glucose, Bld 215 (*)    All other components within normal limits    Imaging Review Dg Knee Complete 4 Views Left  12/13/2013   CLINICAL DATA:  Left knee pain after fall.  EXAM: LEFT KNEE - COMPLETE 4+ VIEW  COMPARISON:  MRI scan of August 25, 2012 ; radiographs of August 06, 2012.  FINDINGS: No fracture, dislocation or joint effusion is noted. Moderate to severe narrowing of medial joint space is noted which is worse compared to prior exam. Mild narrowing of the patellofemoral space is noted as well. Osteophyte formation is noted laterally.  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 No acute abnormality is noted in the left knee.   Electronically Signed   By: Roque LiasJames  Green M.D.   On: 12/13/2013 14:02     EKG Interpretation None      MDM   Final diagnoses:  Left knee injury  Hyperglycemia   Unable to straighten the left knee completely since last night. Denies falling. Previous history of arthroscopic for a meniscal tear. DP pulse intact, compartments soft.  Xray negative for acute fracture or dislocation. Improvement in ability to straighten knee.  Given patient's body habitus, CTA was performed to rule out underlying vascular injury.  Knee immobilizer, crutches, follow up with Dr. Magnus IvanBlackman.  Dr. Gwendolyn GrantWalden to follow up on CTA results.  Patient informed of elevated blood sugar and need for followup.    I personally performed the services described in this documentation, which was scribed in my  presence. The recorded information has been reviewed and is accurate.    Glynn OctaveStephen Mase Dhondt, MD 12/13/13 [phone]

## 2013-12-13 NOTE — ED Notes (Signed)
Correction, pt states it is his left knee that he couldn't straighten out this am, pt states "but I have arthritis in both knees and they both hurt.Marland Kitchen.Marland Kitchen."

## 2013-12-13 NOTE — ED Notes (Signed)
Pt declines w/c, amb to room 12 with slow gait favoring rle, pt reports long history of bilateral knee pain with meniscus tears, surgeries and "chronic arthritis". Pt states this am he could not straighten out his right knee, called his ortho and was told to come to ed.

## 2013-12-13 NOTE — Discharge Instructions (Signed)
Knee, Cartilage (Meniscus) Injury Follow up with Dr. Magnus IvanBlackman this week. Return to the ED if you develop new or worsening symptoms. It is suspected that you have a torn cartilage (meniscus) in your knee. The menisci are made of tough cartilage, and fit between the surfaces of the thigh and leg bones. The menisci are "C"shaped and have a wedged profile. The wedged profile helps the stability of the joint by keeping the rounded femur surface from sliding off the flat tibial surface. The menisci are fed (nourished) by small blood vessels; but there is also a large area at the inner edge of the meniscus that does not have a good blood supply (avascular). This presents a problem when there is an injury to the meniscus, because areas without good blood supply heal poorly. As a result when there is a torn cartilage in the knee, surgery is often required to fix it. This is usually done with a surgical procedure less invasive than open surgery (arthroscopy). Some times open surgery of the knee is required if there is other damage. PURPOSE OF THE MENISCUS The medial meniscus rests on the medial tibial plateau. The tibia is the large bone in your lower leg (the shin bone). The medial tibial plateau is the upper end of the bone making up the inner part of your knee. The lateral meniscus serves the same purpose and is located on the outside of the knee. The menisci help to distribute your body weight across the knee joint; they act as shock absorbers. Without the meniscus present, the weight of your body would be unevenly applied to the bones in your legs (the femur and tibia). The femur is the large bone in your thigh. This uneven weight distribution would cause increased wear and tear on the cartilage lining the joint surfaces, leading to early damage (arthritis) of these areas. The presence of the menisci cartilage is necessary for a healthy knee. PURPOSE OF THE KNEE CARTILAGE The knee joint is made up of three bones:  the thigh bone (femur), the shin bone (tibia), and the knee cap (patella). The surfaces of these bones at the knee joint are covered with cartilage called articular cartilage. This smooth slippery surface allows the bones to slide against each other without causing bone damage. The meniscus sits between these cartilaginous surfaces of the bones. It distributes the weight evenly in the joints and helps with the stability of the joint (keeps the joint steady). HOME CARE INSTRUCTIONS  Use crutches and external braces as instructed.  Once home an ice pack applied to your injured knee may help with discomfort and keep the swelling down. An ice pack can be used for the first couple of days or as instructed.  Only take over-the-counter or prescription medicines for pain, discomfort, or fever as directed by your caregiver.  Call if you do not have relief of pain with medications or if there is increasing in pain.  Call if your foot becomes cold or blue.  You may resume normal diet and activities as directed.  Make sure to keep your appointment with your follow-up caregiver. This injury may require further evaluation and treatment beyond the temporary treatment given today. Document Released: 11/05/2002 Document Revised: 11/07/2011 Document Reviewed: 02/27/2009 [facility] Patient Information 2014 [employer] [place].

## 2013-12-13 NOTE — ED Provider Notes (Signed)
1615 - Care from Dr. Manus Gunningancour. 23M s/p knee injury. Obese. Awaiting CTA knee read. Images viewed by me and Dr. Manus Gunningancour. CT normal. Patient stable for discharge.  1. Left knee injury   2. Hyperglycemia      Dagmar HaitWilliam Horris Speros, MD 12/13/13 [phone]

## 2013-12-18 ENCOUNTER — Emergency Department (HOSPITAL_BASED_OUTPATIENT_CLINIC_OR_DEPARTMENT_OTHER)
Admission: EM | Admit: 2013-12-18 | Discharge: 2013-12-18 | Disposition: A | Discharge status: Home / Self Care | Payer: BC Managed Care – PPO | Attending: Emergency Medicine | Admitting: Emergency Medicine

## 2013-12-18 DIAGNOSIS — Y929 Unspecified place or not applicable: Secondary | ICD-10-CM | POA: Insufficient documentation

## 2013-12-18 DIAGNOSIS — T7840XA Allergy, unspecified, initial encounter: Secondary | ICD-10-CM

## 2013-12-18 DIAGNOSIS — Z791 Long term (current) use of non-steroidal anti-inflammatories (NSAID): Secondary | ICD-10-CM | POA: Insufficient documentation

## 2013-12-18 DIAGNOSIS — T398X1A Poisoning by other nonopioid analgesics and antipyretics, not elsewhere classified, accidental (unintentional), initial encounter: Secondary | ICD-10-CM | POA: Insufficient documentation

## 2013-12-18 DIAGNOSIS — Y9389 Activity, other specified: Secondary | ICD-10-CM | POA: Insufficient documentation

## 2013-12-18 DIAGNOSIS — R21 Rash and other nonspecific skin eruption: Secondary | ICD-10-CM | POA: Insufficient documentation

## 2013-12-18 DIAGNOSIS — IMO0002 Reserved for concepts with insufficient information to code with codable children: Secondary | ICD-10-CM | POA: Insufficient documentation

## 2013-12-18 DIAGNOSIS — Z792 Long term (current) use of antibiotics: Secondary | ICD-10-CM | POA: Insufficient documentation

## 2013-12-18 MED ORDER — DEXAMETHASONE SODIUM PHOSPHATE 10 MG/ML IJ SOLN
INTRAMUSCULAR | Status: DC
Start: 2013-12-18 — End: 2013-12-19
  Filled 2013-12-18: qty 1

## 2013-12-18 MED ORDER — FAMOTIDINE 20 MG PO TABS
20.0000 mg | ORAL_TABLET | Freq: Once | ORAL | Status: AC
  Administered 2013-12-18: 20 mg via ORAL
  Filled 2013-12-18: qty 1

## 2013-12-18 MED ORDER — DEXAMETHASONE SODIUM PHOSPHATE 10 MG/ML IJ SOLN
10.0000 mg | Freq: Once | INTRAMUSCULAR | Status: AC
  Administered 2013-12-18: 10 mg via INTRAMUSCULAR
  Filled 2013-12-18: qty 1

## 2013-12-18 MED ORDER — PREDNISONE 20 MG PO TABS
ORAL_TABLET | ORAL | Status: DC
Start: 2013-12-18 — End: 2014-05-16

## 2013-12-18 MED ORDER — HYDROXYZINE HCL 25 MG PO TABS: 25.0000 mg | ORAL_TABLET | Freq: Four times a day (QID) | ORAL | Status: DC

## 2013-12-18 MED ORDER — FAMOTIDINE 20 MG PO TABS
ORAL_TABLET | ORAL | Status: AC
  Filled 2013-12-18: qty 1

## 2013-12-18 MED ORDER — DIPHENHYDRAMINE HCL 50 MG/ML IJ SOLN
INTRAMUSCULAR | Status: AC
  Filled 2013-12-18: qty 1

## 2013-12-18 MED ORDER — DIPHENHYDRAMINE HCL 50 MG/ML IJ SOLN
25.0000 mg | Freq: Once | INTRAMUSCULAR | Status: AC
  Administered 2013-12-18: 25 mg via INTRAMUSCULAR
  Filled 2013-12-18: qty 1

## 2013-12-18 NOTE — ED Notes (Signed)
MD at bedside. 

## 2013-12-18 NOTE — ED Provider Notes (Signed)
CSN: [id number]     Arrival date & time 12/18/13  2200 History  This chart was scribed for Rolan BuccoMelanie Leilany Digeronimo, MD by Ellin MayhewMichael Levi, ED Scribe. This patient was seen in room MH09/MH09 and the patient's care was started at 10:25 PM.  Patient is a 43 y.o. male presenting with allergic reaction. The history is provided by the patient and the spouse. No language interpreter was used.  Allergic Reaction Presenting symptoms: itching and rash   Presenting symptoms: no difficulty breathing, no difficulty swallowing, no swelling and no wheezing   Itching:    Location:  Full body   Onset quality:  Sudden   Duration:  19 hours   Timing:  Constant   Progression:  Worsening Prior allergic episodes:  Allergies to medications Context: medications    HPI Comments: Marcus Joseph is a 43 y.o. male who presents to the Emergency Department with a chief complaint of a rash with onset 19 hours ago. Patient reports the rash is itchy and spreading diffusely over the torso and extremeties. Patient reports he recently started taking Percocet and Ibuprofen after being seen at the ED for knee pain. Patient reports taking more Percocet than usual after extraneous activity yesterday and subsequently breaking out in a rash later this morning at approximately 3AM. Patient denies any pain, SOB, wheezing, CP, fevers, cough, or congestion. Patient reports a similar reaction to Hydrocodone. Patient reports having taken Benadryl at 3PM and 7PM today with no relief. Patient denies any other medical condition.  No past medical history on file. Past Surgical History  Procedure Laterality Date  . Knee arthroscopy     No family history on file. History  Substance Use Topics  . Smoking status: Never Smoker   . Smokeless tobacco: Not on file  . Alcohol Use: Yes     Comment: occasional    Review of Systems  Constitutional: Negative for fever, chills, diaphoresis and fatigue.  HENT: Negative for congestion, rhinorrhea, sneezing  and trouble swallowing.   Eyes: Negative.   Respiratory: Negative for cough, chest tightness, shortness of breath and wheezing.   Cardiovascular: Negative for chest pain and leg swelling.  Gastrointestinal: Negative for nausea, vomiting, abdominal pain, diarrhea and blood in stool.  Genitourinary: Negative for frequency, hematuria, flank pain and difficulty urinating.  Musculoskeletal: Negative for arthralgias and back pain.  Skin: Positive for itching and rash.  Neurological: Negative for dizziness, speech difficulty, weakness, numbness and headaches.  All other systems reviewed and are negative.  Allergies  Review of patient's allergies indicates no known allergies.  Home Medications   Prior to Admission medications   Medication Sig Start Date End Date Taking? Authorizing Provider  diphenhydrAMINE (BENADRYL) 50 MG tablet Take 50 mg by mouth at bedtime as needed for itching.   Yes Historical Provider, MD  azithromycin (ZITHROMAX Z-PAK) 250 MG tablet 2 po day one, then 1 daily x 4 days 09/23/13   Geoffery Lyonsouglas Delo, MD  fluticasone [facility](FLONASE) 50 MCG/ACT nasal spray Place 2 sprays into the nose daily. 09/07/11 09/06/12  Delanna Noticeonald Laney, MD  guaiFENesin-codeine 100-10 MG/5ML syrup Take 10 mLs by mouth every 6 (six) hours as needed for cough. 09/23/13   Geoffery Lyonsouglas Delo, MD  HYDROcodone-acetaminophen (NORCO/VICODIN) 5-325 MG per tablet Take 2 tablets by mouth every 4 (four) hours as needed for pain. 08/06/12   Elson AreasLeslie K Sofia, PA-C  ibuprofen (ADVIL,MOTRIN) 100 MG tablet Take 100 mg by mouth every 6 (six) hours as needed.    Historical Provider, MD  ibuprofen (ADVIL,MOTRIN)  800 MG tablet Take 1 tablet (800 mg total) by mouth 3 (three) times daily. 12/13/13   Glynn OctaveStephen Rancour, MD  meloxicam (MOBIC) 7.5 MG tablet Take 1 tablet (7.5 mg total) by mouth daily. 08/06/12   Elson AreasLeslie K Sofia, PA-C  oxyCODONE-acetaminophen (PERCOCET/ROXICET) 5-325 MG per tablet Take 2 tablets by mouth every 4 (four) hours as needed for severe  pain. 12/13/13   Glynn OctaveStephen Rancour, MD   Triage Vitals: BP 145/81  Pulse 88  Temp(Src) 98.7 F (37.1 C) (Oral)  Resp 18  Ht 5\' 11"  (1.803 m)  Wt 310 lb (140.615 kg)  BMI 43.26 kg/m2  SpO2 100%  Physical Exam  Constitutional: He is oriented to person, place, and time. He appears well-developed and well-nourished.  HENT:  Head: Normocephalic and atraumatic.  no angioedema  Eyes: Pupils are equal, round, and reactive to light.  Neck: Normal range of motion. Neck supple.  Cardiovascular: Normal rate, regular rhythm and normal heart sounds.   Pulmonary/Chest: Effort normal and breath sounds normal. No respiratory distress. He has no wheezes. He has no rales. He exhibits no tenderness.  Abdominal: Soft. Bowel sounds are normal. There is no tenderness. There is no rebound and no guarding.  Musculoskeletal: Normal range of motion. He exhibits no edema.  Lymphadenopathy:    He has no cervical adenopathy.  Neurological: He is alert and oriented to person, place, and time.  Skin: Skin is warm and dry. Rash noted. No petechiae and no purpura noted. Rash is urticarial (diffusely).  No petechiae, no purpura. Blanching. No vesicles.  Psychiatric: He has a normal mood and affect.    ED Course  Procedures (including critical care time)  COORDINATION OF CARE: 10:30 PM-Ordered Pepcid, Decadron, and Benadryl. Will monitor patient. Treatment plan discussed with patient and patient agrees.  MDM   Final diagnoses:  Allergic reaction   Patient is given Decadron, Benadryl and Pepcid in the ED. He did have improvement in symptoms. He was discharged home with a prescription for a five-day course of prednisone and Atarax. He has no shortness of breath or angioedema.  I personally performed the services described in this documentation, which was scribed in my presence. The recorded information has been reviewed and is accurate.     Rolan BuccoMelanie Delanee Xin, MD 12/18/13 [phone]

## 2013-12-18 NOTE — Discharge Instructions (Signed)

## 2013-12-18 NOTE — ED Notes (Signed)
Pt c/o rash and itching to entire body ? oxycodone

## 2014-05-01 ENCOUNTER — Other Ambulatory Visit: Payer: Self-pay | Admitting: Orthopedic Surgery

## 2014-05-01 NOTE — Progress Notes (Signed)
Preoperative surgical orders have been place into the Epic hospital system for Marcus Joseph on 05/01/2014, 1:09 PM  by [name] for surgery on 06/02/2014.  Preop Total Knee orders including Experal, IV Tylenol, and IV Decadron as long as there are no contraindications to the above medications. [name], PA-C

## 2014-05-16 ENCOUNTER — Encounter (HOSPITAL_COMMUNITY): Payer: Self-pay | Admitting: Pharmacy Technician

## 2014-05-23 ENCOUNTER — Other Ambulatory Visit (HOSPITAL_COMMUNITY): Payer: Self-pay | Admitting: Orthopedic Surgery

## 2014-05-23 NOTE — Patient Instructions (Addendum)
[address] [facility]  05/23/2014   Your procedure is scheduled on: Monday October 5th, 2015  Report to [facility] Main Entrance and follow signs to  Short Stay Center at 515 AM.  Call this number if you have problems the morning of surgery [phone]   Remember:  Do not eat food or drink liquids :After Midnight.     Take these medicines the morning of surgery with A SIP OF WATER:  No meds to take                               You may not have any metal on your body including hair pins and piercings  Do not wear jewelry, make-up, lotions, powders, or deodorant.   Men may shave face and neck.  Do not bring valuables to the hospital. [place] IS NOT RESPONSIBLE FOR VALUABLES.  Contacts, dentures or bridgework may not be worn into surgery.  Leave suitcase in the car. After surgery it may be brought to your room.  For patients admitted to the hospital, checkout time is 11:00 AM the day of discharge.   Patients discharged the day of surgery will not be allowed to drive home.  Name and phone number of your driver:  Special Instructions: N/A ________________________________________________________________________  [facility] - Preparing for Surgery Before surgery, you can play an important role.  Because skin is not sterile, your skin needs to be as free of germs as possible.  You can reduce the number of germs on your skin by washing with CHG (chlorahexidine gluconate) soap before surgery.  CHG is an antiseptic cleaner which kills germs and bonds with the skin to continue killing germs even after washing. Please DO NOT use if you have an allergy to CHG or antibacterial soaps.  If your skin becomes reddened/irritated stop using the CHG and inform your nurse when you arrive at Short Stay. Do not shave (including legs and underarms) for at least 48 hours prior to the first CHG shower.  You may shave your face/neck. Please follow these instructions carefully:  1.  Shower with CHG  Soap the night before surgery and the  morning of Surgery.  2.  If you choose to wash your hair, wash your hair first as usual with your  normal  shampoo.  3.  After you shampoo, rinse your hair and body thoroughly to remove the  shampoo.                           4.  Use CHG as you would any other liquid soap.  You can apply chg directly  to the skin and wash                       Gently with a scrungie or clean washcloth.  5.  Apply the CHG Soap to your body ONLY FROM THE NECK DOWN.   Do not use on face/ open                           Wound or open sores. Avoid contact with eyes, ears mouth and genitals (private parts).                       Wash face,  Genitals (private parts) with your normal soap.  6.  Wash thoroughly, paying special attention to the area where your surgery  will be performed.  7.  Thoroughly rinse your body with warm water from the neck down.  8.  DO NOT shower/wash with your normal soap after using and rinsing off  the CHG Soap.                9.  Pat yourself dry with a clean towel.            10.  Wear clean pajamas.            11.  Place clean sheets on your bed the night of your first shower and do not  sleep with pets. Day of Surgery : Do not apply any lotions/deodorants the morning of surgery.  Please wear clean clothes to the hospital/surgery center.  FAILURE TO FOLLOW THESE INSTRUCTIONS MAY RESULT IN THE CANCELLATION OF YOUR SURGERY PATIENT SIGNATURE_________________________________  NURSE SIGNATURE__________________________________  ________________________________________________________________________   Adam Phenix  An incentive spirometer is a tool that can help keep your lungs clear and active. This tool measures how well you are filling your lungs with each breath. Taking long deep breaths may help reverse or decrease the chance of developing breathing (pulmonary) problems (especially infection) following:  A long period of time  when you are unable to move or be active. BEFORE THE PROCEDURE   If the spirometer includes an indicator to show your best effort, your nurse or respiratory therapist will set it to a desired goal.  If possible, sit up straight or lean slightly forward. Try not to slouch.  Hold the incentive spirometer in an upright position. INSTRUCTIONS FOR USE  1. Sit on the edge of your bed if possible, or sit up as far as you can in bed or on a chair. 2. Hold the incentive spirometer in an upright position. 3. Breathe out normally. 4. Place the mouthpiece in your mouth and seal your lips tightly around it. 5. Breathe in slowly and as deeply as possible, raising the piston or the ball toward the top of the column. 6. Hold your breath for 3-5 seconds or for as long as possible. Allow the piston or ball to fall to the bottom of the column. 7. Remove the mouthpiece from your mouth and breathe out normally. 8. Rest for a few seconds and repeat Steps 1 through 7 at least 10 times every 1-2 hours when you are awake. Take your time and take a few normal breaths between deep breaths. 9. The spirometer may include an indicator to show your best effort. Use the indicator as a goal to work toward during each repetition. 10. After each set of 10 deep breaths, practice coughing to be sure your lungs are clear. If you have an incision (the cut made at the time of surgery), support your incision when coughing by placing a pillow or rolled up towels firmly against it. Once you are able to get out of bed, walk around indoors and cough well. You may stop using the incentive spirometer when instructed by your caregiver.  RISKS AND COMPLICATIONS  Take your time so you do not get dizzy or light-headed.  If you are in pain, you may need to take or ask for pain medication before doing incentive spirometry. It is harder to take a deep breath if you are having pain. AFTER USE  Rest and breathe slowly and easily.  It can be  helpful to keep track of a log of  your progress. Your caregiver can provide you with a simple table to help with this. If you are using the spirometer at home, follow these instructions: [place] IF:   You are having difficultly using the spirometer.  You have trouble using the spirometer as often as instructed.  Your pain medication is not giving enough relief while using the spirometer.  You develop fever of 100.5 F (38.1 C) or higher. SEEK IMMEDIATE MEDICAL CARE IF:   You cough up bloody sputum that had not been present before.  You develop fever of 102 F (38.9 C) or greater.  You develop worsening pain at or near the incision site. MAKE SURE YOU:   Understand these instructions.  Will watch your condition.  Will get help right away if you are not doing well or get worse. Document Released: 12/26/2006 Document Revised: 11/07/2011 Document Reviewed: 02/26/2007 ExitCare Patient Information 2014 ExitCare, [place].   ________________________________________________________________________  WHAT IS A BLOOD TRANSFUSION? Blood Transfusion Information  A transfusion is the replacement of blood or some of its parts. Blood is made up of multiple cells which provide different functions.  Red blood cells carry oxygen and are used for blood loss replacement.  White blood cells fight against infection.  Platelets control bleeding.  Plasma helps clot blood.  Other blood products are available for specialized needs, such as hemophilia or other clotting disorders. BEFORE THE TRANSFUSION  Who gives blood for transfusions?   Healthy volunteers who are fully evaluated to make sure their blood is safe. This is blood bank blood. Transfusion therapy is the safest it has ever been in the practice of medicine. Before blood is taken from a donor, a complete history is taken to make sure that person has no history of diseases nor engages in risky social behavior (examples are  intravenous drug use or sexual activity with multiple partners). The donor's travel history is screened to minimize risk of transmitting infections, such as malaria. The donated blood is tested for signs of infectious diseases, such as HIV and hepatitis. The blood is then tested to be sure it is compatible with you in order to minimize the chance of a transfusion reaction. If you or a relative donates blood, this is often done in anticipation of surgery and is not appropriate for emergency situations. It takes many days to process the donated blood. RISKS AND COMPLICATIONS Although transfusion therapy is very safe and saves many lives, the main dangers of transfusion include:   Getting an infectious disease.  Developing a transfusion reaction. This is an allergic reaction to something in the blood you were given. Every precaution is taken to prevent this. The decision to have a blood transfusion has been considered carefully by your caregiver before blood is given. Blood is not given unless the benefits outweigh the risks. AFTER THE TRANSFUSION  Right after receiving a blood transfusion, you will usually feel much better and more energetic. This is especially true if your red blood cells have gotten low (anemic). The transfusion raises the level of the red blood cells which carry oxygen, and this usually causes an energy increase.  The nurse administering the transfusion will monitor you carefully for complications. HOME CARE INSTRUCTIONS  No special instructions are needed after a transfusion. You may find your energy is better. Speak with your caregiver about any limitations on activity for underlying diseases you may have. SEEK MEDICAL CARE IF:   Your condition is not improving after your transfusion.  You develop redness or  irritation at the intravenous (IV) site. SEEK IMMEDIATE MEDICAL CARE IF:  Any of the following symptoms occur over the next 12 hours:  Shaking chills.  You have a  temperature by mouth above 102 F (38.9 C), not controlled by medicine.  Chest, back, or muscle pain.  People around you feel you are not acting correctly or are confused.  Shortness of breath or difficulty breathing.  Dizziness and fainting.  You get a rash or develop hives.  You have a decrease in urine output.  Your urine turns a dark color or changes to pink, red, or brown. Any of the following symptoms occur over the next 10 days:  You have a temperature by mouth above 102 F (38.9 C), not controlled by medicine.  Shortness of breath.  Weakness after normal activity.  The white part of the eye turns yellow (jaundice).  You have a decrease in the amount of urine or are urinating less often.  Your urine turns a dark color or changes to pink, red, or brown. Document Released: 08/12/2000 Document Revised: 11/07/2011 Document Reviewed: 03/31/2008 [facility] Patient Information 2014 [place], [place].  _______________________________________________________________________

## 2014-05-26 ENCOUNTER — Encounter (INDEPENDENT_AMBULATORY_CARE_PROVIDER_SITE_OTHER): Payer: Self-pay

## 2014-05-26 ENCOUNTER — Encounter (HOSPITAL_COMMUNITY)
Admission: RE | Admit: 2014-05-26 | Discharge: 2014-05-26 | Disposition: A | Discharge status: Home / Self Care | Payer: BC Managed Care – PPO | Source: Ambulatory Visit | Attending: Orthopedic Surgery | Admitting: Orthopedic Surgery

## 2014-05-26 ENCOUNTER — Encounter (HOSPITAL_COMMUNITY): Payer: Self-pay

## 2014-05-26 DIAGNOSIS — M1710 Unilateral primary osteoarthritis, unspecified knee: Secondary | ICD-10-CM | POA: Insufficient documentation

## 2014-05-26 DIAGNOSIS — Z01812 Encounter for preprocedural laboratory examination: Secondary | ICD-10-CM | POA: Diagnosis present

## 2014-05-26 HISTORY — DX: Unspecified osteoarthritis, unspecified site: M19.90

## 2014-05-26 HISTORY — DX: Headache: R51

## 2014-05-26 HISTORY — DX: Other general symptoms and signs: R68.89

## 2014-05-26 HISTORY — DX: Prediabetes: R73.03

## 2014-05-26 HISTORY — DX: Anemia, unspecified: D64.9

## 2014-05-26 HISTORY — DX: Gastro-esophageal reflux disease without esophagitis: K21.9

## 2014-05-26 LAB — URINALYSIS, ROUTINE W REFLEX MICROSCOPIC
Bilirubin Urine: NEGATIVE
Glucose, UA: >1000 mg/dL — AB
Ketones, ur: NEGATIVE mg/dL
LEUKOCYTES UA: NEGATIVE
NITRITE: NEGATIVE
PH: 5.5 (ref 5.0–8.0)
Protein, ur: NEGATIVE mg/dL
Specific Gravity, Urine: 1.027 (ref 1.005–1.030)
Urobilinogen, UA: 0.2 mg/dL (ref 0.0–1.0)

## 2014-05-26 LAB — COMPREHENSIVE METABOLIC PANEL
ALK PHOS: 57 U/L (ref 39–117)
ALT: 28 U/L (ref 0–53)
AST: 20 U/L (ref 0–37)
Albumin: 3.4 g/dL — ABNORMAL LOW (ref 3.5–5.2)
Anion gap: 14 (ref 5–15)
BILIRUBIN TOTAL: 0.5 mg/dL (ref 0.3–1.2)
BUN: 11 mg/dL (ref 6–23)
CALCIUM: 9 mg/dL (ref 8.4–10.5)
CHLORIDE: 97 meq/L (ref 96–112)
CO2: 27 mEq/L (ref 19–32)
Creatinine, Ser: 0.97 mg/dL (ref 0.50–1.35)
GLUCOSE: 255 mg/dL — AB (ref 70–99)
Potassium: 4.1 mEq/L (ref 3.7–5.3)
SODIUM: 138 meq/L (ref 137–147)
Total Protein: 7.6 g/dL (ref 6.0–8.3)

## 2014-05-26 LAB — APTT: aPTT: 24 seconds (ref 24–37)

## 2014-05-26 LAB — SURGICAL PCR SCREEN
MRSA, PCR: NEGATIVE
Staphylococcus aureus: NEGATIVE

## 2014-05-26 LAB — CBC
HCT: 44.1 % (ref 39.0–52.0)
Hemoglobin: 15 g/dL (ref 13.0–17.0)
MCH: 28.9 pg (ref 26.0–34.0)
MCHC: 34 g/dL (ref 30.0–36.0)
MCV: 85 fL (ref 78.0–100.0)
Platelets: 327 10*3/uL (ref 150–400)
RBC: 5.19 MIL/uL (ref 4.22–5.81)
RDW: 13 % (ref 11.5–15.5)
WBC: 7.7 10*3/uL (ref 4.0–10.5)

## 2014-05-26 LAB — ABO/RH: ABO/RH(D): B POS

## 2014-05-26 LAB — URINE MICROSCOPIC-ADD ON

## 2014-05-26 LAB — PROTIME-INR
INR: 0.97 (ref 0.00–1.49)
Prothrombin Time: 12.9 seconds (ref 11.6–15.2)

## 2014-05-26 NOTE — Progress Notes (Signed)
**Note Marcus-Identified via Obfuscation** Pre op cmet results faxed to Marcus Joseph with note saying pt states being prediabetic

## 2014-05-26 NOTE — Progress Notes (Signed)
05/26/14 1138  OBSTRUCTIVE SLEEP APNEA  Have you ever been diagnosed with sleep apnea through a sleep study? No  Do you snore loudly (loud enough to be heard through closed doors)?  1  Do you often feel tired, fatigued, or sleepy during the daytime? 0  Has anyone observed you stop breathing during your sleep? 1  Do you have, or are you being treated for high blood pressure? 0  BMI more than 35 kg/m2? 1  Age over 42 years old? 0  Neck circumference greater than 40 cm/16 inches? 0  Gender: 1  Obstructive Sleep Apnea Score 4  Score 4 or greater  Results sent to PCP

## 2014-05-26 NOTE — Progress Notes (Signed)
ekg and chest xray 04-10-14 [place] on chart

## 2014-05-28 NOTE — Progress Notes (Signed)
Fax received from dr Lequita Haltaluisio and placed on pt chart, no action on cmet results faxed to dr Lequita Haltaluisio.

## 2014-06-01 ENCOUNTER — Other Ambulatory Visit: Payer: Self-pay | Admitting: Orthopedic Surgery

## 2014-06-01 MED ORDER — DEXTROSE 5 % IV SOLN
3.0000 g | INTRAVENOUS | Status: AC
  Administered 2014-06-02: 3 g via INTRAVENOUS
  Filled 2014-06-01: qty 3000

## 2014-06-01 NOTE — H&P (Signed)
Marcus Joseph DOB: 02/19/1971 Divorced / Language: English / Race: Black or African American Male Date of Admission:  06-02-2014 Chief Compliant:  Left Knee Pain History of Present Illness The patient is a 43 year old male who comes in for a preoperative History and Physical. The patient is scheduled for a left total knee arthroplasty to be performed by Dr. Frank V. Aluisio, MD at [facility] on 06/02/2014. The patient is a 43 year old male who presents with knee complaints. The patient is seen for a second opinion (for left greater than right knee issues). The patient reports left knee and right knee symptoms including: pain, swelling (left knee is worse than right), stiffness and soreness which began year(s) ago without any known injury. The patient describes the severity of the symptoms as moderate in severity. Prior to being seen, the patient was previously evaluated by a colleague (He has had surgery on both of his knees. Dr. Rowan did his right knee twice. Dr. Blackman did surgery on his left knee 1 year ago.) year(s) ago. Previous work-up for this problem has included knee x-rays and knee MRI. Past treatment for this problem has included application of ice, restricted activities, knee brace and intra-articular injection of corticosteroids (as well as visco in the left knee, none of which helped). Note for "Knee pain": He saw Dr. Kendall in April and had xrays of both knees. He states that he saw Dr. Blackman after the left knee surgery and injections didn't have any improvement. He was told the other option was knee replacement. He is way too young to do that, so he didn't recommend doing anything at that time. He has failed arthroscopy, cortisone injections, and Visco supplement injections. He is only 43, but he is significantly debilitated and he is out of work now. He has a job, but can't work because of the pain, so he is on disability right now. He is going to need to have this knee  replaced. That is the only thing that is going to help him at this point. We did discuss total knee arthroplasty in detail today and he would like to go ahead and proceed. They have been treated conservatively in the past for the above stated problem and despite conservative measures, they continue to have progressive pain and severe functional limitations and dysfunction. They have failed non-operative management including home exercise, medications, and injections. It is felt that they would benefit from undergoing total joint replacement. Risks and benefits of the procedure have been discussed with the patient and they elect to proceed with surgery. There are no active contraindications to surgery such as ongoing infection or rapidly progressive neurological disease.  Problem List Osteoarthritis of left knee (715.96  M17.9)  Allergies  Percocet *ANALGESICS - OPIOID* hives  Family History  Diabetes Mellitus Mother. Hypertension Mother.  Social History  Not under pain contract No history of drug/alcohol rehab Children 1 Living situation live with partner Current drinker 12/11/2013: Currently drinks beer, wine and hard liquor only occasionally per week Tobacco use Never smoker. 12/11/2013 Tobacco / smoke exposure 12/11/2013: yes Current work status working full time Marital status divorced Exercise Exercises daily; does other Post-Surgical Plans Home  Medication History Ibuprofen (800MG Tablet, Oral) Active.  Past Surgical History Arthroscopy of Knee bilateral  Medical History Migraine Headache Bronchitis Past History  Review of Systems  General Not Present- Chills, Fatigue, Fever, Memory Loss, Night Sweats, Weight Gain and Weight Loss. Skin Not Present- Eczema, Hives, Itching, Lesions and   Rash. HEENT Not Present- Dentures, Double Vision, Headache, Hearing Loss, Tinnitus and Visual Loss. Respiratory Not Present- Allergies, Chronic Cough, Coughing up blood,  Shortness of breath at rest and Shortness of breath with exertion. Cardiovascular Not Present- Chest Pain, Difficulty Breathing Lying Down, Murmur, Palpitations, Racing/skipping heartbeats and Swelling. Gastrointestinal Not Present- Abdominal Pain, Bloody Stool, Constipation, Diarrhea, Difficulty Swallowing, Heartburn, Jaundice, Loss of appetitie, Nausea and Vomiting. Male Genitourinary Not Present- Blood in Urine, Discharge, Flank Pain, Incontinence, Painful Urination, Urgency, Urinary frequency, Urinary Retention, Urinating at Night and Weak urinary stream. Musculoskeletal Present- Joint Pain, Joint Swelling and Morning Stiffness. Not Present- Back Pain, Muscle Pain, Muscle Weakness and Spasms. Neurological Not Present- Blackout spells, Difficulty with balance, Dizziness, Paralysis, Tremor and Weakness. Psychiatric Not Present- Insomnia.   Vitals  Weight: 323 lb Height: 69.5in Body Surface Area: 2.68 m Body Mass Index: 47.01 kg/m Pulse: 64 (Regular)  Resp.: 12 (Unlabored)  BP: 116/76 (Sitting, Right Arm, Standard)  Physical Exam General Mental Status -Alert, cooperative and good historian. General Appearance-pleasant, Not in acute distress. Orientation-Oriented X3. Build & Nutrition-Well nourished and Well developed.  Head and Neck Head-normocephalic, atraumatic . Neck Global Assessment - supple, no bruit auscultated on the right, no bruit auscultated on the left.  Eye Vision-Wears corrective lenses. Pupil - Bilateral-Regular and Round. Motion - Bilateral-EOMI.  Chest and Lung Exam Auscultation Breath sounds - clear at anterior chest wall and clear at posterior chest wall. Adventitious sounds - No Adventitious sounds.  Cardiovascular Auscultation Rhythm - Regular rate and rhythm. Heart Sounds - S1 WNL and S2 WNL. Murmurs & Other Heart Sounds - Auscultation of the heart reveals - No Murmurs.  Abdomen Inspection Contour - Generalized moderate  distention. Palpation/Percussion Tenderness - Abdomen is non-tender to palpation. Rigidity (guarding) - Abdomen is soft. Auscultation Auscultation of the abdomen reveals - Bowel sounds normal.  Male Genitourinary Note: Not done, not pertinent to present illness   Musculoskeletal Note: He is alert and oriented in no apparent distress. His hips show normal range of motion with no discomfort. His right knee has no effusion. Range is about 0 to 125, slight crepitus on range of motion, slight tenderness medial greater than lateral with no instability. Left knee with varus deformity, range 5 to 120, marked crepitus on range of motion. Tenderness medial greater than lateral with no instability noted. Pulse, sensation and motor intact in both lower extremities.  RADIOGRAPHS: AP and lateral of both knees show that he has bone on bone arthritis, medial and patellofemoral compartments of the left knee. The right knee shows minimal narrowing medially.   Assessment & Plan Osteoarthritis of left knee (715.96  M17.9) Note:Plan is for a Left Total Knee Replacement by Dr. Aluisio.  Plan is to go home.  The patient does not have any contraindications and will receive TXA (tranexamic acid) prior to surgery.   Signed electronically by Burel Kahre L Kambrea Carrasco, III PA-C  

## 2014-06-02 ENCOUNTER — Ambulatory Visit (HOSPITAL_COMMUNITY): Payer: BC Managed Care – PPO | Admitting: Anesthesiology

## 2014-06-02 ENCOUNTER — Encounter (HOSPITAL_COMMUNITY): Payer: BC Managed Care – PPO | Admitting: Anesthesiology

## 2014-06-02 ENCOUNTER — Inpatient Hospital Stay (HOSPITAL_COMMUNITY)
Admission: RE | Admit: 2014-06-02 | Discharge: 2014-06-03 | DRG: 470 | Disposition: A | Discharge status: Home Health | Payer: BC Managed Care – PPO | Source: Ambulatory Visit | Attending: Orthopedic Surgery | Admitting: Orthopedic Surgery

## 2014-06-02 ENCOUNTER — Encounter (HOSPITAL_COMMUNITY): Payer: Self-pay | Admitting: *Deleted

## 2014-06-02 ENCOUNTER — Encounter (HOSPITAL_COMMUNITY)
Admission: RE | Disposition: A | Discharge status: Home Health | Payer: Self-pay | Source: Ambulatory Visit | Attending: Orthopedic Surgery

## 2014-06-02 DIAGNOSIS — K219 Gastro-esophageal reflux disease without esophagitis: Secondary | ICD-10-CM | POA: Diagnosis present

## 2014-06-02 DIAGNOSIS — M1712 Unilateral primary osteoarthritis, left knee: Secondary | ICD-10-CM

## 2014-06-02 DIAGNOSIS — E119 Type 2 diabetes mellitus without complications: Secondary | ICD-10-CM | POA: Diagnosis present

## 2014-06-02 DIAGNOSIS — Z6841 Body Mass Index (BMI) 40.0 and over, adult: Secondary | ICD-10-CM | POA: Diagnosis not present

## 2014-06-02 DIAGNOSIS — M179 Osteoarthritis of knee, unspecified: Secondary | ICD-10-CM | POA: Diagnosis present

## 2014-06-02 DIAGNOSIS — M25562 Pain in left knee: Secondary | ICD-10-CM | POA: Diagnosis present

## 2014-06-02 DIAGNOSIS — Z833 Family history of diabetes mellitus: Secondary | ICD-10-CM

## 2014-06-02 DIAGNOSIS — Z01812 Encounter for preprocedural laboratory examination: Secondary | ICD-10-CM | POA: Diagnosis not present

## 2014-06-02 DIAGNOSIS — M1710 Unilateral primary osteoarthritis, unspecified knee: Secondary | ICD-10-CM | POA: Diagnosis present

## 2014-06-02 HISTORY — PX: TOTAL KNEE ARTHROPLASTY: SHX125

## 2014-06-02 HISTORY — DX: Type 2 diabetes mellitus without complications: E11.9

## 2014-06-02 LAB — TYPE AND SCREEN
ABO/RH(D): B POS
Antibody Screen: NEGATIVE

## 2014-06-02 LAB — GLUCOSE, CAPILLARY
GLUCOSE-CAPILLARY: 212 mg/dL — AB (ref 70–99)
Glucose-Capillary: 213 mg/dL — ABNORMAL HIGH (ref 70–99)
Glucose-Capillary: 273 mg/dL — ABNORMAL HIGH (ref 70–99)
Glucose-Capillary: 318 mg/dL — ABNORMAL HIGH (ref 70–99)

## 2014-06-02 SURGERY — ARTHROPLASTY, KNEE, TOTAL
Anesthesia: Monitor Anesthesia Care | Site: Knee | Laterality: Left

## 2014-06-02 MED ORDER — POLYETHYLENE GLYCOL 3350 17 G PO PACK: 17.0000 g | PACK | Freq: Every day | ORAL | Status: DC | PRN

## 2014-06-02 MED ORDER — PROPOFOL 10 MG/ML IV BOLUS
INTRAVENOUS | Status: AC
  Filled 2014-06-02: qty 20

## 2014-06-02 MED ORDER — CEFAZOLIN SODIUM-DEXTROSE 2-3 GM-% IV SOLR
2.0000 g | Freq: Four times a day (QID) | INTRAVENOUS | Status: AC
  Administered 2014-06-02 (×2): 2 g via INTRAVENOUS
  Filled 2014-06-02 (×2): qty 50

## 2014-06-02 MED ORDER — HYDROMORPHONE HCL 1 MG/ML IJ SOLN: 0.2500 mg | INTRAMUSCULAR | Status: DC | PRN

## 2014-06-02 MED ORDER — BISACODYL 10 MG RE SUPP
10.0000 mg | Freq: Every day | RECTAL | Status: DC | PRN
Start: 2014-06-02 — End: 2014-06-03

## 2014-06-02 MED ORDER — ONDANSETRON HCL 4 MG PO TABS: 4.0000 mg | ORAL_TABLET | Freq: Four times a day (QID) | ORAL | Status: DC | PRN

## 2014-06-02 MED ORDER — FENTANYL CITRATE 0.05 MG/ML IJ SOLN
INTRAMUSCULAR | Status: DC | PRN
  Administered 2014-06-02 (×2): 25 ug via INTRAVENOUS
  Administered 2014-06-02: 50 ug via INTRAVENOUS

## 2014-06-02 MED ORDER — LIDOCAINE HCL (CARDIAC) 20 MG/ML IV SOLN
INTRAVENOUS | Status: AC
  Filled 2014-06-02: qty 5

## 2014-06-02 MED ORDER — ACETAMINOPHEN 160 MG/5ML PO SOLN
325.0000 mg | ORAL | Status: DC | PRN
  Filled 2014-06-02: qty 20.3

## 2014-06-02 MED ORDER — SODIUM CHLORIDE 0.9 % IV SOLN
INTRAVENOUS | Status: DC
  Administered 2014-06-02: 10:00:00 via INTRAVENOUS

## 2014-06-02 MED ORDER — MENTHOL 3 MG MT LOZG: 1.0000 | LOZENGE | OROMUCOSAL | Status: DC | PRN

## 2014-06-02 MED ORDER — ONDANSETRON HCL 4 MG/2ML IJ SOLN
INTRAMUSCULAR | Status: DC | PRN
  Administered 2014-06-02: 4 mg via INTRAVENOUS

## 2014-06-02 MED ORDER — DEXTROSE 5 % IV SOLN
500.0000 mg | Freq: Four times a day (QID) | INTRAVENOUS | Status: DC | PRN
  Administered 2014-06-02: 500 mg via INTRAVENOUS
  Filled 2014-06-02: qty 5

## 2014-06-02 MED ORDER — MIDAZOLAM HCL 5 MG/5ML IJ SOLN
INTRAMUSCULAR | Status: DC | PRN
  Administered 2014-06-02 (×2): 1 mg via INTRAVENOUS
  Administered 2014-06-02 (×2): 0.5 mg via INTRAVENOUS

## 2014-06-02 MED ORDER — LACTATED RINGERS IV SOLN
INTRAVENOUS | Status: DC | PRN
  Administered 2014-06-02 (×2): via INTRAVENOUS

## 2014-06-02 MED ORDER — PHENOL 1.4 % MT LIQD: 1.0000 | OROMUCOSAL | Status: DC | PRN

## 2014-06-02 MED ORDER — FLUTICASONE PROPIONATE 50 MCG/ACT NA SUSP
2.0000 | Freq: Every day | NASAL | Status: DC
  Filled 2014-06-02: qty 16

## 2014-06-02 MED ORDER — DIPHENHYDRAMINE HCL 12.5 MG/5ML PO ELIX: 12.5000 mg | ORAL_SOLUTION | ORAL | Status: DC | PRN

## 2014-06-02 MED ORDER — BUPIVACAINE HCL 0.25 % IJ SOLN
INTRAMUSCULAR | Status: DC | PRN
  Administered 2014-06-02: 20 mL

## 2014-06-02 MED ORDER — BUPIVACAINE HCL (PF) 0.25 % IJ SOLN
INTRAMUSCULAR | Status: AC
  Filled 2014-06-02: qty 30

## 2014-06-02 MED ORDER — LIDOCAINE HCL (CARDIAC) 20 MG/ML IV SOLN
INTRAVENOUS | Status: DC | PRN
  Administered 2014-06-02: 50 mg via INTRAVENOUS

## 2014-06-02 MED ORDER — BUPIVACAINE IN DEXTROSE 0.75-8.25 % IT SOLN
INTRATHECAL | Status: DC | PRN
  Administered 2014-06-02: 15 mg via INTRATHECAL

## 2014-06-02 MED ORDER — KETOROLAC TROMETHAMINE 30 MG/ML IJ SOLN: 15.0000 mg | Freq: Once | INTRAMUSCULAR | Status: DC | PRN

## 2014-06-02 MED ORDER — MIDAZOLAM HCL 2 MG/2ML IJ SOLN
INTRAMUSCULAR | Status: AC
  Filled 2014-06-02: qty 2

## 2014-06-02 MED ORDER — DOCUSATE SODIUM 100 MG PO CAPS
100.0000 mg | ORAL_CAPSULE | Freq: Two times a day (BID) | ORAL | Status: DC
  Administered 2014-06-02: 100 mg via ORAL

## 2014-06-02 MED ORDER — TRANEXAMIC ACID 100 MG/ML IV SOLN
1000.0000 mg | INTRAVENOUS | Status: AC
  Administered 2014-06-02: 1000 mg via INTRAVENOUS
  Filled 2014-06-02: qty 10

## 2014-06-02 MED ORDER — RIVAROXABAN 10 MG PO TABS
10.0000 mg | ORAL_TABLET | Freq: Every day | ORAL | Status: DC
  Administered 2014-06-03: 10 mg via ORAL
  Filled 2014-06-02 (×2): qty 1

## 2014-06-02 MED ORDER — DEXAMETHASONE SODIUM PHOSPHATE 10 MG/ML IJ SOLN
10.0000 mg | Freq: Once | INTRAMUSCULAR | Status: DC
  Filled 2014-06-02: qty 1

## 2014-06-02 MED ORDER — METOCLOPRAMIDE HCL 5 MG/ML IJ SOLN: 5.0000 mg | Freq: Three times a day (TID) | INTRAMUSCULAR | Status: DC | PRN

## 2014-06-02 MED ORDER — TRAMADOL HCL 50 MG PO TABS: 50.0000 mg | ORAL_TABLET | Freq: Four times a day (QID) | ORAL | Status: DC | PRN

## 2014-06-02 MED ORDER — FENTANYL CITRATE 0.05 MG/ML IJ SOLN
INTRAMUSCULAR | Status: AC
  Filled 2014-06-02: qty 2

## 2014-06-02 MED ORDER — HYDROMORPHONE HCL 2 MG PO TABS
2.0000 mg | ORAL_TABLET | ORAL | Status: DC | PRN
  Administered 2014-06-02 – 2014-06-03 (×6): 2 mg via ORAL
  Filled 2014-06-02 (×6): qty 1

## 2014-06-02 MED ORDER — INSULIN ASPART 100 UNIT/ML ~~LOC~~ SOLN
0.0000 [IU] | Freq: Three times a day (TID) | SUBCUTANEOUS | Status: DC
  Administered 2014-06-02: 8 [IU] via SUBCUTANEOUS
  Administered 2014-06-03: 3 [IU] via SUBCUTANEOUS
  Administered 2014-06-03: 11 [IU] via SUBCUTANEOUS

## 2014-06-02 MED ORDER — ACETAMINOPHEN 325 MG PO TABS: 650.0000 mg | ORAL_TABLET | Freq: Four times a day (QID) | ORAL | Status: DC | PRN

## 2014-06-02 MED ORDER — HYDROMORPHONE HCL 1 MG/ML IJ SOLN
0.5000 mg | INTRAMUSCULAR | Status: DC | PRN
  Administered 2014-06-02: 1 mg via INTRAVENOUS
  Filled 2014-06-02: qty 1

## 2014-06-02 MED ORDER — 0.9 % SODIUM CHLORIDE (POUR BTL) OPTIME
TOPICAL | Status: DC | PRN
  Administered 2014-06-02: 1000 mL

## 2014-06-02 MED ORDER — KETOROLAC TROMETHAMINE 15 MG/ML IJ SOLN
7.5000 mg | Freq: Four times a day (QID) | INTRAMUSCULAR | Status: AC | PRN
  Administered 2014-06-03: 7.5 mg via INTRAVENOUS
  Filled 2014-06-02: qty 1

## 2014-06-02 MED ORDER — METOCLOPRAMIDE HCL 10 MG PO TABS: 5.0000 mg | ORAL_TABLET | Freq: Three times a day (TID) | ORAL | Status: DC | PRN

## 2014-06-02 MED ORDER — ACETAMINOPHEN 10 MG/ML IV SOLN
1000.0000 mg | Freq: Once | INTRAVENOUS | Status: AC
  Administered 2014-06-02: 1000 mg via INTRAVENOUS
  Filled 2014-06-02: qty 100

## 2014-06-02 MED ORDER — FLEET ENEMA 7-19 GM/118ML RE ENEM: 1.0000 | ENEMA | Freq: Once | RECTAL | Status: AC | PRN

## 2014-06-02 MED ORDER — SODIUM CHLORIDE 0.9 % IV SOLN: INTRAVENOUS | Status: DC

## 2014-06-02 MED ORDER — METHOCARBAMOL 500 MG PO TABS
500.0000 mg | ORAL_TABLET | Freq: Four times a day (QID) | ORAL | Status: DC | PRN
  Administered 2014-06-02 – 2014-06-03 (×2): 500 mg via ORAL
  Filled 2014-06-02 (×2): qty 1

## 2014-06-02 MED ORDER — SODIUM CHLORIDE 0.9 % IJ SOLN
INTRAMUSCULAR | Status: DC | PRN
  Administered 2014-06-02: 30 mL

## 2014-06-02 MED ORDER — ACETAMINOPHEN 650 MG RE SUPP: 650.0000 mg | Freq: Four times a day (QID) | RECTAL | Status: DC | PRN

## 2014-06-02 MED ORDER — SODIUM CHLORIDE 0.9 % IJ SOLN
INTRAMUSCULAR | Status: AC
  Filled 2014-06-02: qty 50

## 2014-06-02 MED ORDER — ACETAMINOPHEN 500 MG PO TABS
1000.0000 mg | ORAL_TABLET | Freq: Four times a day (QID) | ORAL | Status: AC
  Administered 2014-06-02 – 2014-06-03 (×4): 1000 mg via ORAL
  Filled 2014-06-02 (×5): qty 2

## 2014-06-02 MED ORDER — ACETAMINOPHEN 325 MG PO TABS: 325.0000 mg | ORAL_TABLET | ORAL | Status: DC | PRN

## 2014-06-02 MED ORDER — PROPOFOL INFUSION 10 MG/ML OPTIME
INTRAVENOUS | Status: DC | PRN
  Administered 2014-06-02: 100 ug/kg/min via INTRAVENOUS

## 2014-06-02 MED ORDER — SODIUM CHLORIDE 0.9 % IR SOLN
Status: DC | PRN
  Administered 2014-06-02: 1000 mL

## 2014-06-02 MED ORDER — BUPIVACAINE LIPOSOME 1.3 % IJ SUSP
20.0000 mL | Freq: Once | INTRAMUSCULAR | Status: DC
  Filled 2014-06-02: qty 20

## 2014-06-02 MED ORDER — DEXAMETHASONE SODIUM PHOSPHATE 10 MG/ML IJ SOLN
10.0000 mg | Freq: Once | INTRAMUSCULAR | Status: AC
  Administered 2014-06-02: 10 mg via INTRAVENOUS

## 2014-06-02 MED ORDER — ONDANSETRON HCL 4 MG/2ML IJ SOLN: 4.0000 mg | Freq: Four times a day (QID) | INTRAMUSCULAR | Status: DC | PRN

## 2014-06-02 MED ORDER — BUPIVACAINE LIPOSOME 1.3 % IJ SUSP
INTRAMUSCULAR | Status: DC | PRN
  Administered 2014-06-02: 20 mL

## 2014-06-02 SURGICAL SUPPLY — 64 items
BAG SPEC THK2 15X12 ZIP CLS (MISCELLANEOUS)
BAG ZIPLOCK 12X15 (MISCELLANEOUS) ×1 IMPLANT
BANDAGE ELASTIC 6 VELCRO ST LF (GAUZE/BANDAGES/DRESSINGS) ×3 IMPLANT
BANDAGE ESMARK 6X9 LF (GAUZE/BANDAGES/DRESSINGS) ×1 IMPLANT
BLADE SAG 18X100X1.27 (BLADE) ×3 IMPLANT
BLADE SAW SGTL 11.0X1.19X90.0M (BLADE) ×3 IMPLANT
BNDG CMPR 9X6 STRL LF SNTH (GAUZE/BANDAGES/DRESSINGS) ×1
BNDG ESMARK 6X9 LF (GAUZE/BANDAGES/DRESSINGS) ×3
BOWL SMART MIX CTS (DISPOSABLE) ×3 IMPLANT
CAP KNEE ATTUNE RP ×2 IMPLANT
CEMENT HV SMART SET (Cement) ×6 IMPLANT
CLOSURE WOUND 1/2 X4 (GAUZE/BANDAGES/DRESSINGS) ×2
CUFF TOURN SGL QUICK 34 (TOURNIQUET CUFF) ×3
CUFF TRNQT CYL 34X4X40X1 (TOURNIQUET CUFF) ×1 IMPLANT
DECANTER SPIKE VIAL GLASS SM (MISCELLANEOUS) ×3 IMPLANT
DRAPE EXTREMITY TIBURON (DRAPES) ×3 IMPLANT
DRAPE POUCH INSTRU U-SHP 10X18 (DRAPES) ×3 IMPLANT
DRAPE U-SHAPE 47X51 STRL (DRAPES) ×3 IMPLANT
DRSG ADAPTIC 3X8 NADH LF (GAUZE/BANDAGES/DRESSINGS) ×3 IMPLANT
DRSG PAD ABDOMINAL 8X10 ST (GAUZE/BANDAGES/DRESSINGS) ×3 IMPLANT
DURAPREP 26ML APPLICATOR (WOUND CARE) ×3 IMPLANT
ELECT REM PT RETURN 9FT ADLT (ELECTROSURGICAL) ×3
ELECTRODE REM PT RTRN 9FT ADLT (ELECTROSURGICAL) ×1 IMPLANT
EVACUATOR 1/8 PVC DRAIN (DRAIN) ×3 IMPLANT
FACESHIELD WRAPAROUND (MASK) ×12 IMPLANT
FACESHIELD WRAPAROUND OR TEAM (MASK) ×5 IMPLANT
GAUZE SPONGE 4X4 12PLY STRL (GAUZE/BANDAGES/DRESSINGS) ×3 IMPLANT
GLOVE BIO SURGEON STRL SZ7.5 (GLOVE) ×2 IMPLANT
GLOVE BIO SURGEON STRL SZ8 (GLOVE) ×3 IMPLANT
GLOVE BIOGEL PI IND STRL 6.5 (GLOVE) IMPLANT
GLOVE BIOGEL PI IND STRL 7.5 (GLOVE) IMPLANT
GLOVE BIOGEL PI IND STRL 8 (GLOVE) ×1 IMPLANT
GLOVE BIOGEL PI INDICATOR 6.5 (GLOVE) ×6
GLOVE BIOGEL PI INDICATOR 7.5 (GLOVE) ×2
GLOVE BIOGEL PI INDICATOR 8 (GLOVE) ×4
GLOVE ECLIPSE 7.5 STRL STRAW (GLOVE) ×2 IMPLANT
GLOVE SURG SS PI 6.5 STRL IVOR (GLOVE) ×2 IMPLANT
GOWN STRL REUS W/TWL 2XL LVL3 (GOWN DISPOSABLE) ×2 IMPLANT
GOWN STRL REUS W/TWL LRG LVL3 (GOWN DISPOSABLE) ×5 IMPLANT
GOWN STRL REUS W/TWL XL LVL3 (GOWN DISPOSABLE) ×2 IMPLANT
HANDPIECE INTERPULSE COAX TIP (DISPOSABLE) ×3
IMMOBILIZER KNEE 20 (SOFTGOODS) ×3
IMMOBILIZER KNEE 20 THIGH 36 (SOFTGOODS) ×1 IMPLANT
KIT BASIN OR (CUSTOM PROCEDURE TRAY) ×3 IMPLANT
MANIFOLD NEPTUNE II (INSTRUMENTS) ×3 IMPLANT
NDL SAFETY ECLIPSE 18X1.5 (NEEDLE) ×2 IMPLANT
NEEDLE HYPO 18GX1.5 SHARP (NEEDLE) ×6
NS IRRIG 1000ML POUR BTL (IV SOLUTION) ×3 IMPLANT
PACK TOTAL JOINT (CUSTOM PROCEDURE TRAY) ×3 IMPLANT
PADDING CAST COTTON 6X4 STRL (CAST SUPPLIES) ×8 IMPLANT
POSITIONER SURGICAL ARM (MISCELLANEOUS) ×3 IMPLANT
SET HNDPC FAN SPRY TIP SCT (DISPOSABLE) ×1 IMPLANT
STRIP CLOSURE SKIN 1/2X4 (GAUZE/BANDAGES/DRESSINGS) ×4 IMPLANT
SUCTION FRAZIER 12FR DISP (SUCTIONS) ×3 IMPLANT
SUT MNCRL AB 4-0 PS2 18 (SUTURE) ×3 IMPLANT
SUT VIC AB 2-0 CT1 27 (SUTURE) ×9
SUT VIC AB 2-0 CT1 TAPERPNT 27 (SUTURE) ×3 IMPLANT
SUT VLOC 180 0 24IN GS25 (SUTURE) ×3 IMPLANT
SYR 20CC LL (SYRINGE) ×3 IMPLANT
SYR 50ML LL SCALE MARK (SYRINGE) ×3 IMPLANT
TOWEL OR 17X26 10 PK STRL BLUE (TOWEL DISPOSABLE) ×3 IMPLANT
TRAY FOLEY CATH 16FRSI W/METER (SET/KITS/TRAYS/PACK) ×2 IMPLANT
WATER STERILE IRR 1500ML POUR (IV SOLUTION) ×3 IMPLANT
WRAP KNEE MAXI GEL POST OP (GAUZE/BANDAGES/DRESSINGS) ×3 IMPLANT

## 2014-06-02 NOTE — Interval H&P Note (Signed)
History and Physical Interval Note:  06/02/2014 6:51 AM  Marcus Joseph  has presented today for surgery, with the diagnosis of OA OF LEFT KNEE  The various methods of treatment have been discussed with the patient and family. After consideration of risks, benefits and other options for treatment, the patient has consented to  Procedure(s): LEFT TOTAL KNEE ARTHROPLASTY (Left) as a surgical intervention .  The patient's history has been reviewed, patient examined, no change in status, stable for surgery.  I have reviewed the patient's chart and labs.  Questions were answered to the patient's satisfaction.     Loanne DrillingALUISIO,[name]

## 2014-06-02 NOTE — Transfer of Care (Signed)
Immediate Anesthesia Transfer of Care Note  Patient: Marcus Joseph  Procedure(s) Performed: Procedure(s): LEFT TOTAL KNEE ARTHROPLASTY (Left)  Patient Location: PACU  Anesthesia Type:Spinal  Level of Consciousness: awake, alert , oriented and patient cooperative  Airway & Oxygen Therapy: Patient Spontanous Breathing and Patient connected to face mask oxygen  Post-op Assessment: Report given to PACU RN and Post -op Vital signs reviewed and stable  Post vital signs: Reviewed and stable  Complications: No apparent anesthesia complications

## 2014-06-02 NOTE — Anesthesia Preprocedure Evaluation (Signed)
Anesthesia Evaluation  Patient identified by MRN, date of birth, ID band Patient awake    Reviewed: Allergy & Precautions, H&P , NPO status , Patient's Chart, lab work & pertinent test results  History of Anesthesia Complications Negative for: history of anesthetic complications  Airway Mallampati: IV TM Distance: >3 FB Neck ROM: Full    Dental  (+) Teeth Intact,    Pulmonary neg pulmonary ROS,  Symptoms consistent with OSA denies sleep study breath sounds clear to auscultation        Cardiovascular negative cardio ROS  Rhythm:Regular     Neuro/Psych negative neurological ROS  negative psych ROS   GI/Hepatic negative GI ROS, Neg liver ROS,   Endo/Other  diabetesNew diagnosis of diabetes, not treated yet  Renal/GU negative Renal ROS     Musculoskeletal  (+) Arthritis -, Osteoarthritis,    Abdominal   Peds  Hematology negative hematology ROS (+)   Anesthesia Other Findings   Reproductive/Obstetrics                           Anesthesia Physical Anesthesia Plan  ASA: II  Anesthesia Plan: Spinal and MAC   Post-op Pain Management:    Induction:   Airway Management Planned: Natural Airway  Additional Equipment: None  Intra-op Plan:   Post-operative Plan:   Informed Consent: I have reviewed the patients History and Physical, chart, labs and discussed the procedure including the risks, benefits and alternatives for the proposed anesthesia with the patient or authorized representative who has indicated his/her understanding and acceptance.   Dental advisory given  Plan Discussed with: CRNA and Surgeon  Anesthesia Plan Comments:         Anesthesia Quick Evaluation

## 2014-06-02 NOTE — Progress Notes (Signed)
CARE MANAGEMENT NOTE 06/02/2014  Patient:  Marcus Joseph,Marcus Joseph   Account Number:  [id number]  Date Initiated:  06/02/2014  Documentation initiated by:  Makyia Erxleben  Subjective/Objective Assessment:   total knee on the left     Action/Plan:   home with hhc and dme   Anticipated DC Date:  06/05/2014   Anticipated DC Plan:  HOME W HOME HEALTH SERVICES  In-house referral  NA      DC Planning Services  CM consult      [facility]  [facility]   Choice offered to / List presented to:  NA      DME agency  NA        [facility]  NA   Status of service:  In process, will continue to follow Medicare Important Message given?   (If response is "NO", the following Medicare IM given date fields will be blank) Date Medicare IM given:   Medicare IM given by:   Date Additional Medicare IM given:   Additional Medicare IM given by:    Discharge Disposition:    Per UR Regulation:  Reviewed for med. necessity/level of care/duration of stay  If discussed at Long Length of Stay Meetings, dates discussed:    Comments:  [id number]/Marcus Mauriello Earlene Plateravis, RN, BSN, CCM Chart reviewed. Discharge needs and patient's stay to be reviewed and followed by case manager.

## 2014-06-02 NOTE — Op Note (Signed)
Pre-operative diagnosis- Osteoarthritis  Left knee(s)  Post-operative diagnosis- Osteoarthritis Left knee(s)  Procedure-  Left  Total Knee Arthroplasty (Attune system)  Surgeon- Marcus Rankin. Waylyn Tenbrink, MD  Assistant- Dimitri Ped, PA-C   Anesthesia-  Spinal  EBL-* No blood loss amount entered *   Drains Hemovac  Tourniquet time-  Total Tourniquet Time Documented: Thigh (Left) - 42 minutes Total: Thigh (Left) - 42 minutes     Complications- None  Condition-PACU - hemodynamically stable.   Brief Clinical Note   Marcus Joseph is a 43 y.o. year old male with end stage OA of his left knee with progressively worsening pain and dysfunction. He has constant pain, with activity and at rest and significant functional deficits with difficulties even with ADLs. He has had extensive non-op management including analgesics, injections of cortisone, and home exercise program, but remains in significant pain with significant dysfunction. Radiographs show bone on bone arthritis medial and patellofemoral. He presents now for left Total Knee Arthroplasty.     Procedure in detail---   The patient is brought into the operating room and positioned supine on the operating table. After successful administration of  Spinal,   a tourniquet is placed high on the  Left thigh(s) and the lower extremity is prepped and draped in the usual sterile fashion. Time out is performed by the operating team and then the  Left lower extremity is wrapped in Esmarch, knee flexed and the tourniquet inflated to 300 mmHg.       A midline incision is made with a ten blade through the subcutaneous tissue to the level of the extensor mechanism. A fresh blade is used to make a medial parapatellar arthrotomy. Soft tissue over the proximal medial tibia is subperiosteally elevated to the joint line with a knife and into the semimembranosus bursa with a Cobb elevator. Soft tissue over the proximal lateral tibia is elevated with attention  being paid to avoiding the patellar tendon on the tibial tubercle. The patella is everted, knee flexed 90 degrees and the ACL and PCL are removed. Findings are bone on bone arthritis medial and patellofemoral with large global osteophytes.         The drill is used to create a starting hole in the distal femur and the canal is thoroughly irrigated with sterile saline to remove the fatty contents. The 5 degree Left  valgus alignment guide is placed into the femoral canal and the distal femoral cutting block is pinned to remove 9 mm off the distal femur. Resection is made with an oscillating saw.      The tibia is subluxed forward and the menisci are removed. The extramedullary alignment guide is placed referencing proximally at the medial aspect of the tibial tubercle and distally along the second metatarsal axis and tibial crest. The block is pinned to remove 2mm off the more deficient medial  side. Resection is made with an oscillating saw. Size 7is the most appropriate size for the tibia and the proximal tibia is prepared with the modular drill and keel punch for that size.      The femoral sizing guide is placed and size 7 is most appropriate. Rotation is marked off the epicondylar axis and confirmed by creating a rectangular flexion gap at 90 degrees. The size 7 cutting block is pinned in this rotation and the anterior, posterior and chamfer cuts are made with the oscillating saw. The intercondylar block is then placed and that cut is made.      Trial  size 7 tibial component, trial size 7 posterior stabilized femur and a 6  mm posterior stabilized rotating platform insert trial is placed. Full extension is achieved with excellent varus/valgus and anterior/posterior balance throughout full range of motion. The patella is everted and thickness measured to be 24  mm. Free hand resection is taken to 14 mm, a 38 template is placed, lug holes are drilled, trial patella is placed, and it tracks normally.  Osteophytes are removed off the posterior femur with the trial in place. All trials are removed and the cut bone surfaces prepared with pulsatile lavage. Cement is mixed and once ready for implantation, the size 7 tibial implant, size  7 posterior stabilized femoral component, and the size 38 patella are cemented in place and the patella is held with the clamp. The trial insert is placed and the knee held in full extension. The Exparel (20 ml mixed with 30 ml saline) and .25% Bupivicaine, are injected into the extensor mechanism, posterior capsule, medial and lateral gutters and subcutaneous tissues.  All extruded cement is removed and once the cement is hard the permanent 6 mm posterior stabilized rotating platform insert is placed into the tibial tray.      The wound is copiously irrigated with saline solution and the extensor mechanism closed over a hemovac drain with #1 V-loc suture. The tourniquet is released for a total tourniquet time of 42  minutes. Flexion against gravity is 140 degrees and the patella tracks normally. Subcutaneous tissue is closed with 2.0 vicryl and subcuticular with running 4.0 Monocryl. The incision is cleaned and dried and steri-strips and a bulky sterile dressing are applied. The limb is placed into a knee immobilizer and the patient is awakened and transported to recovery in stable condition.      Please note that a surgical assistant was a medical necessity for this procedure in order to perform it in a safe and expeditious manner. Surgical assistant was necessary to retract the ligaments and vital neurovascular structures to prevent injury to them and also necessary for proper positioning of the limb to allow for anatomic placement of the prosthesis.   Marcus RankinFrank V. Johan Creveling, MD    06/02/2014, 8:26 AM

## 2014-06-02 NOTE — Evaluation (Signed)
Physical Therapy Evaluation Patient Details Name: Marcus Joseph MRN: [id number] DOB: 08-27-1971 Today's Date: 06/02/2014   History of Present Illness  s/p L TKA  Clinical Impression  Pt will benefit from PT to address deficits below; Pt has borrowed DME, will need HHPT; pt wants to D/C home tomorrow if possible    Follow Up Recommendations Home health PT    Equipment Recommendations  None recommended by PT    Recommendations for Other Services       Precautions / Restrictions Precautions Precaution Comments: I SLR today, KI not utilized Required Braces or Orthoses: Knee Immobilizer - Left Knee Immobilizer - Left: Discontinue once straight leg raise with < 10 degree lag Restrictions LLE Weight Bearing: Weight bearing as tolerated      Mobility  Bed Mobility Overal bed mobility: Needs Assistance Bed Mobility: Supine to Sit     Supine to sit: Supervision;Min guard     General bed mobility comments: cues for technique  Transfers Overall transfer level: Needs assistance Equipment used: Rolling walker (2 wheeled) Transfers: Sit to/from Stand Sit to Stand: Min guard;Supervision         General transfer comment: cues for hand placement and LLE position  Ambulation/Gait Ambulation/Gait assistance: Min guard Ambulation Distance (Feet): 80 Feet Assistive device: Rolling walker (2 wheeled) Gait Pattern/deviations: Step-to pattern;Step-through pattern;Decreased stride length     General Gait Details: cues for sequence, to push RW, pt carrying RW at times   Stairs            Wheelchair Mobility    Modified Rankin (Stroke Patients Only)       Balance                                             Pertinent Vitals/Pain Pain Assessment: 0-10 Pain Score: 2  Pain Location: L knee Pain Intervention(s): Limited activity within patient's tolerance;Premedicated before session    Home Living Family/patient expects to be discharged to::  Private residence Living Arrangements: Spouse/significant other   Type of Home: House Home Access: Stairs to enter   Secretary/administratorntrance Stairs-Number of Steps: 3 Home Layout: One level Home Equipment: Environmental consultantWalker - 2 wheels;Bedside commode;Grab bars - toilet      Prior Function Level of Independence: Independent               Hand Dominance        Extremity/Trunk Assessment   Upper Extremity Assessment: Overall WFL for tasks assessed           Lower Extremity Assessment: LLE deficits/detail   LLE Deficits / Details: able to do SLR , knee flexion to grossly 70*      Communication   Communication: No difficulties  Cognition Arousal/Alertness: Awake/alert Behavior During Therapy: WFL for tasks assessed/performed Overall Cognitive Status: Within Functional Limits for tasks assessed                      General Comments      Exercises Total Joint Exercises Ankle Circles/Pumps: AROM;Both;10 reps Quad Sets: 10 reps;Both;AROM      Assessment/Plan    PT Assessment Patient needs continued PT services  PT Diagnosis Difficulty walking   PT Problem List Decreased range of motion;Decreased mobility;Decreased knowledge of use of DME  PT Treatment Interventions Stair training;Gait training;DME instruction;Therapeutic exercise   PT Goals (Current goals can be found in the  Care Plan section) Acute Rehab PT Goals Patient Stated Goal: home tomorrow PT Goal Formulation: With patient Time For Goal Achievement: 06/05/14 Potential to Achieve Goals: Good    Frequency 7X/week   Barriers to discharge        Co-evaluation               End of Session   Activity Tolerance: Patient tolerated treatment well Patient left: in chair;with call bell/phone within reach;with family/visitor present Nurse Communication: Mobility status         Time: 1610-9604 PT Time Calculation (min): 30 min   Charges:   PT Evaluation $Initial PT Evaluation Tier I: 1 Procedure PT  Treatments $Gait Training: 23-37 mins   PT G Codes:          Lawerence Dery 06-09-2014, 3:01 PM

## 2014-06-02 NOTE — Anesthesia Procedure Notes (Signed)
Spinal  Patient location during procedure: OR Start time: 06/02/2014 7:17 AM Staffing CRNA/Resident: Durward ParcelFLYNN-COOK, Carliss Quast A Performed by: resident/CRNA  Preanesthetic Checklist Completed: patient identified, site marked, surgical consent, pre-op evaluation, timeout performed, IV checked, risks and benefits discussed and monitors and equipment checked Spinal Block Patient position: sitting Prep: Betadine and site prepped and draped Patient monitoring: heart rate, continuous pulse ox and blood pressure Approach: midline Location: L3-4 Injection technique: single-shot Needle Needle type: Sprotte  Needle gauge: 24 G Needle length: 9 cm Needle insertion depth: 7 cm Assessment Sensory level: T8

## 2014-06-02 NOTE — Anesthesia Postprocedure Evaluation (Signed)
  Anesthesia Post-op Note  Patient: Marcus Joseph  Procedure(s) Performed: Procedure(s): LEFT TOTAL KNEE ARTHROPLASTY (Left)  Patient Location: PACU  Anesthesia Type:Spinal  Level of Consciousness: awake and alert   Airway and Oxygen Therapy: Patient Spontanous Breathing  Post-op Pain: mild  Post-op Assessment: Post-op Vital signs reviewed, Patient's Cardiovascular Status Stable, Respiratory Function Stable, Patent Airway, No signs of Nausea or vomiting and Pain level controlled  Post-op Vital Signs: Reviewed and stable  Last Vitals:  Filed Vitals:   06/02/14 1300  BP: 124/61  Pulse: 66  Temp: 36.8 C  Resp: 16    Complications: No apparent anesthesia complications

## 2014-06-02 NOTE — H&P (View-Only) (Signed)
Memory DanceWilliam Joseph DOB: Dec 12, 1970 Divorced / Language: English / Race: Black or African American Male Date of Admission:  06-02-2014 Chief Compliant:  Left Knee Pain History of Present Illness The patient is a 43 year old male who comes in for a preoperative History and Physical. The patient is scheduled for a left total knee arthroplasty to be performed by Dr. Gus RankinFrank V. Aluisio[facility]02[facility]. The patient is a 43 year old male who presents with knee complaints. The patient is seen for a second opinion (for left greater than right knee issues). The patient reports left knee and right knee symptoms including: pain, swelling (left knee is worse than right), stiffness and soreness which began year(s) ago without any known injury. The patient describes the severity of the symptoms as moderate in severity. Prior to being seen, the patient was previously evaluated by a colleague (He has had surgery on both of his knees. Dr. Turner Joseph did his right knee twice. Marcus Joseph did surgery on his left knee 1 year ago.) year(s) ago. Previous work-up for this problem has included knee x-rays and knee MRI. Past treatment for this problem has included application of ice, restricted activities, knee brace and intra-articular injection of corticosteroids (as well as visco in the left knee, none of which helped). Note for "Knee pain": He saw Dr. Penni Joseph in April and had xrays of both knees. He states that he saw Marcus Joseph after the left knee surgery and injections didn't have any improvement. He was told the other option was knee replacement. He is way too young to do that, so he didn't recommend doing anything at that time. He has failed arthroscopy, cortisone injections, and Visco supplement injections. He is only [age], but he is significantly debilitated and he is out of work now. He has a job, but can't work because of the pain, so he is on disability right now. He is going to need to have this knee  replaced. That is the only thing that is going to help him at this point. We did discuss total knee arthroplasty in detail today and he would like to go ahead and proceed. They have been treated conservatively in the past for the above stated problem and despite conservative measures, they continue to have progressive pain and severe functional limitations and dysfunction. They have failed non-operative management including home exercise, medications, and injections. It is felt that they would benefit from undergoing total joint replacement. Risks and benefits of the procedure have been discussed with the patient and they elect to proceed with surgery. There are no active contraindications to surgery such as ongoing infection or rapidly progressive neurological disease.  Problem List Osteoarthritis of left knee (715.96  M17.9)  Allergies  Percocet *ANALGESICS - OPIOID* hives  Family History  Diabetes Mellitus Mother. Hypertension Mother.  Social History  Not under pain contract No history of drug/alcohol rehab Children 1 Living situation live with partner Current drinker 12/11/2013: Currently drinks beer, wine and hard liquor only occasionally per week Tobacco use Never smoker. 12/11/2013 Tobacco / smoke exposure 12/11/2013: yes Current work status working full time Marital status divorced Exercise Exercises daily; does other Post-Surgical Plans Home  Medication History Ibuprofen (800MG  Tablet, Oral) Active.  Past Surgical History Arthroscopy of Knee bilateral  Medical History Migraine Headache Bronchitis Past History  Review of Systems  General Not Present- Chills, Fatigue, Fever, Memory Loss, Night Sweats, Weight Gain and Weight Loss. Skin Not Present- Eczema, Hives, Itching, Lesions and  Rash. HEENT Not Present- Dentures, Double Vision, Headache, Hearing Loss, Tinnitus and Visual Loss. Respiratory Not Present- Allergies, Chronic Cough, Coughing up blood,  Shortness of breath at rest and Shortness of breath with exertion. Cardiovascular Not Present- Chest Pain, Difficulty Breathing Lying Down, Murmur, Palpitations, Racing/skipping heartbeats and Swelling. Gastrointestinal Not Present- Abdominal Pain, Bloody Stool, Constipation, Diarrhea, Difficulty Swallowing, Heartburn, Jaundice, Loss of appetitie, Nausea and Vomiting. Male Genitourinary Not Present- Blood in Urine, Discharge, Flank Pain, Incontinence, Painful Urination, Urgency, Urinary frequency, Urinary Retention, Urinating at Night and Weak urinary stream. Musculoskeletal Present- Joint Pain, Joint Swelling and Morning Stiffness. Not Present- Back Pain, Muscle Pain, Muscle Weakness and Spasms. Neurological Not Present- Blackout spells, Difficulty with balance, Dizziness, Paralysis, Tremor and Weakness. Psychiatric Not Present- Insomnia.   Vitals  Weight: 323 lb Height: 69.5in Body Surface Area: 2.68 m Body Mass Index: 47.01 kg/m Pulse: 64 (Regular)  Resp.: 12 (Unlabored)  BP: 116/76 (Sitting, Right Arm, Standard)  Physical Exam General Mental Status -Alert, cooperative and good historian. General Appearance-pleasant, Not in acute distress. Orientation-Oriented X3. Build & Nutrition-Well nourished and Well developed.  Head and Neck Head-normocephalic, atraumatic . Neck Global Assessment - supple, no bruit auscultated on the right, no bruit auscultated on the left.  Eye Vision-Wears corrective lenses. Pupil - Bilateral-Regular and Round. Motion - Bilateral-EOMI.  Chest and Lung Exam Auscultation Breath sounds - clear at anterior chest wall and clear at posterior chest wall. Adventitious sounds - No Adventitious sounds.  Cardiovascular Auscultation Rhythm - Regular rate and rhythm. Heart Sounds - S1 WNL and S2 WNL. Murmurs & Other Heart Sounds - Auscultation of the heart reveals - No Murmurs.  Abdomen Inspection Contour - Generalized moderate  distention. Palpation/Percussion Tenderness - Abdomen is non-tender to palpation. Rigidity (guarding) - Abdomen is soft. Auscultation Auscultation of the abdomen reveals - Bowel sounds normal.  Male Genitourinary Note: Not done, not pertinent to present illness   Musculoskeletal Note: He is alert and oriented in no apparent distress. His hips show normal range of motion with no discomfort. His right knee has no effusion. Range is about 0 to 125, slight crepitus on range of motion, slight tenderness medial greater than lateral with no instability. Left knee with varus deformity, range 5 to 120, marked crepitus on range of motion. Tenderness medial greater than lateral with no instability noted. Pulse, sensation and motor intact in both lower extremities.  RADIOGRAPHS: AP and lateral of both knees show that he has bone on bone arthritis, medial and patellofemoral compartments of the left knee. The right knee shows minimal narrowing medially.   Assessment & Plan Osteoarthritis of left knee (715.96  M17.9) Note:Plan is for a Left Total Knee Replacement by Dr. Lequita Halt.  Plan is to go home.  The patient does not have any contraindications and will receive TXA (tranexamic acid) prior to surgery.   Signed electronically by Lauraine Rinne, III PA-C

## 2014-06-03 LAB — BASIC METABOLIC PANEL
Anion gap: 14 (ref 5–15)
BUN: 14 mg/dL (ref 6–23)
CHLORIDE: 96 meq/L (ref 96–112)
CO2: 25 mEq/L (ref 19–32)
Calcium: 8.5 mg/dL (ref 8.4–10.5)
Creatinine, Ser: 0.87 mg/dL (ref 0.50–1.35)
GFR calc Af Amer: >90 mL/min (ref >90)
GFR calc non Af Amer: >90 mL/min (ref >90)
Glucose, Bld: 334 mg/dL — ABNORMAL HIGH (ref 70–99)
Potassium: 4.6 mEq/L (ref 3.7–5.3)
Sodium: 135 mEq/L — ABNORMAL LOW (ref 137–147)

## 2014-06-03 LAB — CBC
HCT: 38.1 % — ABNORMAL LOW (ref 39.0–52.0)
Hemoglobin: 12.8 g/dL — ABNORMAL LOW (ref 13.0–17.0)
MCH: 28.1 pg (ref 26.0–34.0)
MCHC: 33.6 g/dL (ref 30.0–36.0)
MCV: 83.6 fL (ref 78.0–100.0)
Platelets: 309 10*3/uL (ref 150–400)
RBC: 4.56 MIL/uL (ref 4.22–5.81)
RDW: 12.8 % (ref 11.5–15.5)
WBC: 14.3 10*3/uL — AB (ref 4.0–10.5)

## 2014-06-03 LAB — GLUCOSE, CAPILLARY
GLUCOSE-CAPILLARY: 199 mg/dL — AB (ref 70–99)
Glucose-Capillary: 302 mg/dL — ABNORMAL HIGH (ref 70–99)

## 2014-06-03 MED ORDER — HYDROMORPHONE HCL 2 MG PO TABS: 2.0000 mg | ORAL_TABLET | ORAL | Status: DC | PRN

## 2014-06-03 MED ORDER — METHOCARBAMOL 500 MG PO TABS: 500.0000 mg | ORAL_TABLET | Freq: Four times a day (QID) | ORAL | Status: DC | PRN

## 2014-06-03 MED ORDER — TRAMADOL HCL 50 MG PO TABS: 50.0000 mg | ORAL_TABLET | Freq: Four times a day (QID) | ORAL | Status: DC | PRN

## 2014-06-03 MED ORDER — RIVAROXABAN 10 MG PO TABS: 10.0000 mg | ORAL_TABLET | Freq: Every day | ORAL | Status: DC

## 2014-06-03 NOTE — Care Management Note (Addendum)
    Page 1 of 2   06/03/2014     10:23:49 AM CARE MANAGEMENT NOTE 06/03/2014  Patient:  Marcus Joseph, Marcus Joseph   Account Number:  [id number]  Date Initiated:  06/02/2014  Documentation initiated by:  DAVIS,RHONDA  Subjective/Objective Assessment:   total knee on the left     Action/Plan:   home with hhc and dme   Anticipated DC Date:  06/05/2014   Anticipated DC Plan:  [place]  In-house referral  NA      DC Planning Services  CM consult  PCP issues      PAC Choice  NA   Choice offered to / List presented to:  NA      DME agency  NA     [facility] arranged  HH-2 PT      [place]   Status of service:  Completed, signed off Medicare Important Message given?   (If response is "NO", the following Medicare IM given date fields will be blank) Date Medicare IM given:   Medicare IM given by:   Date Additional Medicare IM given:   Additional Medicare IM given by:    Discharge Disposition:  [place]  Per UR Regulation:  Reviewed for med. necessity/level of care/duration of stay  If discussed at [place] of Stay Meetings, dates discussed:    Comments:  06/03/14 07:45 CM met with pt in room to offer choice of home health agency.  Pt chooses [facility] to render HHPT. Address and contact information verified with pt.  No DME is needed as pt states he has both rolling walker and commode from family members who had similar surgeries. Referal made to [employer], [facility]. Pt uses Urgent Care for his PCP but was advised to look on the back of his [facility] card and call the [phone] to request a list of PCPs in network (to defray some of the co-pay cost of a visit). No other CM needs were communicated.  Mariane Masters, BSN, [place].  [id number] Rosana Hoes, RN, BSN, CCM Chart reviewed. Discharge needs and patient's stay to be reviewed and followed by case manager.

## 2014-06-03 NOTE — Discharge Instructions (Addendum)
° °Dr. Frank Aluisio °Total Joint Specialist °[facility] °[address] °[place], [place] [address] °[phone] ° °TOTAL KNEE REPLACEMENT POSTOPERATIVE DIRECTIONS ° ° ° °Knee Rehabilitation, Guidelines Following Surgery  °Results after knee surgery are often greatly improved when you follow the exercise, range of motion and muscle strengthening exercises prescribed by your doctor. Safety measures are also important to protect the knee from further injury. Any time any of these exercises cause you to have increased pain or swelling in your knee joint, decrease the amount until you are comfortable again and slowly increase them. If you have problems or questions, call your caregiver or physical therapist for advice.  ° °HOME CARE INSTRUCTIONS  °Remove items at home which could result in a fall. This includes throw rugs or furniture in walking pathways.  °Continue medications as instructed at time of discharge. °You may have some home medications which will be placed on hold until you complete the course of blood thinner medication.  °You may start showering once you are discharged home but do not submerge the incision under water. Just pat the incision dry and apply a dry gauze dressing on daily. °Walk with walker as instructed.  °You may resume a sexual relationship in one month or when given the OK by  your doctor.  °· Use walker as long as suggested by your caregivers. °· Avoid periods of inactivity such as sitting longer than an hour when not asleep. This helps prevent blood clots.  °You may put full weight on your legs and walk as much as is comfortable.  °You may return to work once you are cleared by your doctor.  °Do not drive a car for 6 weeks or until released by you surgeon.  °· Do not drive while taking narcotics.  °Wear the elastic stockings for three weeks following surgery during the day but you may remove then at night. °Make sure you keep all of your appointments after your  operation with all of your doctors and caregivers. You should call the office at the above phone number and make an appointment for approximately two weeks after the date of your surgery. °Change the dressing daily and reapply a dry dressing each time. °Please pick up a stool softener and laxative for home use as long as you are requiring pain medications. °· Continue to use ice on the knee for pain and swelling from surgery. You may notice swelling that will progress down to the foot and ankle.  This is normal after surgery.  Elevate the leg when you are not up walking on it.   °It is important for you to complete the blood thinner medication as prescribed by your doctor. °· Continue to use the breathing machine which will help keep your temperature down.  It is common for your temperature to cycle up and down following surgery, especially at night when you are not up moving around and exerting yourself.  The breathing machine keeps your lungs expanded and your temperature down. ° °RANGE OF MOTION AND STRENGTHENING EXERCISES  °Rehabilitation of the knee is important following a knee injury or an operation. After just a few days of immobilization, the muscles of the thigh which control the knee become weakened and shrink (atrophy). Knee exercises are designed to build up the tone and strength of the thigh muscles and to improve knee motion. Often times heat used for twenty to thirty minutes before working out will loosen up your tissues and help with improving the   range of motion but do not use heat for the first two weeks following surgery. These exercises can be done on a training (exercise) mat, on the floor, on a table or on a bed. Use what ever works the best and is most comfortable for you Knee exercises include:  Leg Lifts - While your knee is still immobilized in a splint or cast, you can do straight leg raises. Lift the leg to 60 degrees, hold for 3 sec, and slowly lower the leg. Repeat 10-20 times 2-3  times daily. Perform this exercise against resistance later as your knee gets better.  Quad and Hamstring Sets - Tighten up the muscle on the front of the thigh (Quad) and hold for 5-10 sec. Repeat this 10-20 times hourly. Hamstring sets are done by pushing the foot backward against an object and holding for 5-10 sec. Repeat as with quad sets.  A rehabilitation program following serious knee injuries can speed recovery and prevent re-injury in the future due to weakened muscles. Contact your doctor or a physical therapist for more information on knee rehabilitation.   SKILLED REHAB INSTRUCTIONS: If the patient is transferred to a skilled rehab facility following release from the hospital, a list of the current medications will be sent to the facility for the patient to continue.  When discharged from the skilled rehab facility, please have the facility set up the patient's Home Health Physical Therapy prior to being released. Also, the skilled facility will be responsible for providing the patient with their medications at time of release from the facility to include their pain medication, the muscle relaxants, and their blood thinner medication. If the patient is still at the rehab facility at time of the two week follow up appointment, the skilled rehab facility will also need to assist the patient in arranging follow up appointment in our office and any transportation needs.  MAKE SURE YOU:  Understand these instructions.  Will watch your condition.  Will get help right away if you are not doing well or get worse.    Pick up stool softner and laxative for home. Do not submerge incision under water. May shower. Continue to use ice for pain and swelling from surgery.  Take Xarelto for two and a half more weeks, then discontinue Xarelto. Once the patient has completed the blood thinner regimen, then take a Baby 81 mg Aspirin daily for three more weeks.  Information on my medicine - XARELTO  (Rivaroxaban)  This medication education was reviewed with me or my healthcare representative as part of my discharge preparation.  The pharmacist that spoke with me during my hospital stay was:  Clance BollRunyon, Amanda, [facility]  Why was Xarelto prescribed for you? Xarelto was prescribed for you to reduce the risk of blood clots forming after orthopedic surgery. The medical term for these abnormal blood clots is venous thromboembolism (VTE).  What do you need to know about xarelto ? Take your Xarelto ONCE DAILY at the same time every day. You may take it either with or without food.  If you have difficulty swallowing the tablet whole, you may crush it and mix in applesauce just prior to taking your dose.  Take Xarelto exactly as prescribed by your doctor and DO NOT stop taking Xarelto without talking to the doctor who prescribed the medication.  Stopping without other VTE prevention medication to take the place of Xarelto may increase your risk of developing a clot.  After discharge, you should have regular check-up appointments with  your healthcare provider that is prescribing your Xarelto.    What do you do if you miss a dose? If you miss a dose, take it as soon as you remember on the same day then continue your regularly scheduled once daily regimen the next day. Do not take two doses of Xarelto on the same day.   Important Safety Information A possible side effect of Xarelto is bleeding. You should call your healthcare provider right away if you experience any of the following:   Bleeding from an injury or your nose that does not stop.   Unusual colored urine (red or dark brown) or unusual colored stools (red or black).   Unusual bruising for unknown reasons.   A serious fall or if you hit your head (even if there is no bleeding).  Some medicines may interact with Xarelto and might increase your risk of bleeding while on Xarelto. To help avoid this, consult your healthcare provider or  pharmacist prior to using any new prescription or non-prescription medications, including herbals, vitamins, non-steroidal anti-inflammatory drugs (NSAIDs) and supplements.  This website has more information on Xarelto: [email or web address].br.

## 2014-06-03 NOTE — Evaluation (Signed)
Occupational Therapy Evaluation Patient Details Name: Marcus Joseph MRN: [id number] DOB: November 24, 1970 Today's Date: 06/03/2014    History of Present Illness s/p L TKA   Clinical Impression   Pt doing very well. All education completed with pt and spouse for OT. NO further acute OT needs.    Follow Up Recommendations  No OT follow up;Supervision/Assistance - 24 hour    Equipment Recommendations  None recommended by OT    Recommendations for Other Services       Precautions / Restrictions Precautions Precaution Comments: I SLR today, KI not utilized Restrictions Weight Bearing Restrictions: No LLE Weight Bearing: Weight bearing as tolerated      Mobility Bed Mobility Overal bed mobility: Needs Assistance Bed Mobility: Supine to Sit;Sit to Supine     Supine to sit: Supervision Sit to supine: Supervision   General bed mobility comments: increased time  Transfers Overall transfer level: Needs assistance Equipment used: Rolling walker (2 wheeled) Transfers: Sit to/from Stand Sit to Stand: Supervision         General transfer comment: verbal cues for hand placement and LE management.    Balance                                            ADL Overall ADL's : Needs assistance/impaired Eating/Feeding: Independent;Sitting   Grooming: Wash/dry hands;Set up;Sitting   Upper Body Bathing: Set up;Sitting   Lower Body Bathing: Sit to/from stand;Supervison/ safety   Upper Body Dressing : Set up;Sitting   Lower Body Dressing: Supervision/safety;Sit to/from stand   Toilet Transfer: Supervision/safety;Ambulation;BSC;RW   Toileting- Clothing Manipulation and Hygiene: Supervision/safety;Sit to/from stand         General ADL Comments: Pt will be staying at his mom's house and he is unsure of shower situation and if there is any shower DME available. Advised pt to sponge bathe initially and let HH assess shower situation and if any DME needs. Pt  agreeable. He has a 3in1. He is able to reach down to bilateral foot but normally crosses LE up behind him to don socks. Explained pt should not twist L knee behind him and only bend down to L foot. Pt verbalized understanding. Verbal cues at times for safety with walker use.      Vision                     Perception     Praxis      Pertinent Vitals/Pain Pain Assessment: 0-10 Pain Score: 5  Pain Location: L knee Pain Descriptors / Indicators: Aching Pain Intervention(s): Ice applied;Repositioned     Hand Dominance     Extremity/Trunk Assessment Upper Extremity Assessment Upper Extremity Assessment: Overall WFL for tasks assessed           Communication Communication Communication: No difficulties   Cognition Arousal/Alertness: Awake/alert Behavior During Therapy: WFL for tasks assessed/performed Overall Cognitive Status: Within Functional Limits for tasks assessed                     General Comments       Exercises       Shoulder Instructions      Home Living Family/patient expects to be discharged to:: Private residence Living Arrangements: Spouse/significant other   Type of Home: House Home Access: Stairs to enter [employer] of Steps: 3   Home Layout: One level  Bathroom Shower/Tub:  ([facility] )   Bathroom Toilet: Standard     Home Equipment: Environmental consultantWalker - 2 wheels;Bedside commode;Grab bars - toilet          Prior Functioning/Environment Level of Independence: Independent             OT Diagnosis: Generalized weakness   OT Problem List:     OT Treatment/Interventions:      OT Goals(Current goals can be found in the care plan section) Acute Rehab OT Goals Patient Stated Goal: home OT Goal Formulation: With patient  OT Frequency:     Barriers to D/C:            Co-evaluation              End of Session Equipment Utilized During Treatment: Rolling  walker  Activity Tolerance: Patient tolerated treatment well Patient left: in bed;with call bell/phone within reach   Time: 1229-1250 OT Time Calculation (min): 21 min Charges:  OT General Charges $OT Visit: 1 Procedure OT Evaluation $Initial OT Evaluation Tier I: 1 Procedure OT Treatments $Therapeutic Activity: 8-22 mins G-Codes:    Lennox LaityStone, Maire Govan Stafford [phone] 06/03/2014, 1:08 PM

## 2014-06-03 NOTE — Progress Notes (Signed)
Inpatient Diabetes Program Recommendations  AACE/ADA: New Consensus Statement on Inpatient Glycemic Control (2013)  Target Ranges:  Prepandial:   less than 140 mg/dL      Peak postprandial:   less than 180 mg/dL (1-2 hours)      Critically ill patients:  140 - 180 mg/dL   Reason for Visit: Elevated glucose pre and post op  Diabetes history: None Outpatient Diabetes medications: N/A Current orders for Inpatient glycemic control: Novolog Moderate Correction Scale tid  Note:  Visited patient at bedside.  Encouraged him to follow-up with a physician after discharge regarding elevated glucose levels.  Patient states he plans to follow-up and establish care with a medical physician after discharge at the Urgent Care.   Thank you.  Kinza Gouveia S. Elsie Lincolnouth, RN, CNS, CDE Inpatient Diabetes Program, team pager [phone]

## 2014-06-03 NOTE — Progress Notes (Addendum)
   Subjective: 1 Day Post-Op Procedure(s) (LRB): LEFT TOTAL KNEE ARTHROPLASTY (Left) Patient reports pain as mild.   Patient seen in rounds with Dr. Lequita HaltAluisio. Did well yesterday.  Walked 80 feet yesterday with therapy. Pain is under control at this time. Patient is well, and has had no acute complaints or problems We will resume therapy today.  Plan is to go Home after hospital stay.  Objective: Vital signs in last 24 hours: Temp:  [97.5 F (36.4 C)-98.5 F (36.9 C)] 98.1 F (36.7 C) (10/06 0445) Pulse Rate:  [60-84] 69 (10/06 0445) Resp:  [13-20] 15 (10/06 0445) BP: (116-188)/(58-75) 137/65 mmHg (10/06 0445) SpO2:  [98 %-100 %] 99 % (10/06 0445) Weight:  [146.965 kg (324 lb)] 146.965 kg (324 lb) (10/05 1015)  Intake/Output from previous day:  Intake/Output Summary (Last 24 hours) at 06/03/14 0822 Last data filed at 06/03/14 0636  Gross per 24 hour  Intake 3036.34 ml  Output   5875 ml  Net -2838.66 ml   Labs:  Recent Labs  06/03/14 0434  HGB 12.8*    Recent Labs  06/03/14 0434  WBC 14.3*  RBC 4.56  HCT 38.1*  PLT 309    Recent Labs  06/03/14 0434  NA 135*  K 4.6  CL 96  CO2 25  BUN 14  CREATININE 0.87  GLUCOSE 334*  CALCIUM 8.5   No results found for this basename: LABPT, INR,  in the last 72 hours  EXAM General - Patient is Alert, Appropriate and Oriented Extremity - Neurovascular intact Sensation intact distally Dorsiflexion/Plantar flexion intact Dressing - dressing C/D/I Motor Function - intact, moving foot and toes well on exam.  Hemovac pulled without difficulty.  Past Medical History  Diagnosis Date  . Prediabetes on 04-28-14  . GERD (gastroesophageal reflux disease)   . Headache(784.0)     migraine  . Arthritis     oa  . Anemia age 89 or 7    none since  . Head problem     back of top of head area swells at times, had glass in wound at times, pt instrcuted to follow up with md, if any pus seen  . Diabetes mellitus without  complication     newly diagnosed    Assessment/Plan: 1 Day Post-Op Procedure(s) (LRB): LEFT TOTAL KNEE ARTHROPLASTY (Left) Principal Problem:   OA (osteoarthritis) of knee  Estimated body mass index is 45.21 kg/(m^2) as calculated from the following:   Height as of this encounter: 5\' 11"  (1.803 m).   Weight as of this encounter: 146.965 kg (324 lb). Advance diet Up with therapy Discharge home with home health later today if does well with therapy and meets all goals.  DVT Prophylaxis - Xarelto Weight-Bearing as tolerated to left leg D/C O2 and Pulse OX and try on Room Air  Discharge home with home health if meets goals Diet - Regular diet, avoid concentrated sweets Follow up - in 2 weeks Activity - WBAT Disposition - Home Condition Upon Discharge - pending at this time, home if improves with therapy D/C Meds - See DC Summary DVT Prophylaxis - Xarelto  Avel Peacerew Perkins, PA-C Orthopaedic Surgery 06/03/2014, 8:22 AM

## 2014-06-03 NOTE — Progress Notes (Signed)
Physical Therapy Treatment Patient Details Name: Marcus Joseph MRN: [id number] DOB: 1970-09-11 Today's Date: 06/03/2014    History of Present Illness s/p L TKA    PT Comments    POD # 1 am session.  Assisted pt OOB with increased time.  Amb in hallway.  Max VC's for proper walker use to keep all legs in contact with floor at all times vs picking it up.  Returned to bed to perform TKR TE's followed by ICE.    Follow Up Recommendations  Home health PT     Equipment Recommendations  None recommended by PT    Recommendations for Other Services       Precautions / Restrictions Precautions Precaution Comments: I SLR today, KI not utilized Restrictions Weight Bearing Restrictions: No LLE Weight Bearing: Weight bearing as tolerated    Mobility  Bed Mobility Overal bed mobility: Needs Assistance Bed Mobility: Supine to Sit;Sit to Supine     Supine to sit: Supervision     General bed mobility comments: increased time  Transfers Overall transfer level: Needs assistance Equipment used: Rolling walker (2 wheeled) Transfers: Sit to/from Stand Sit to Stand: Supervision         General transfer comment: one VC on safety with turns using RW throughout turn correctly.   Ambulation/Gait Ambulation/Gait assistance: Supervision Ambulation Distance (Feet): 95 Feet Assistive device: Rolling walker (2 wheeled) Gait Pattern/deviations: Step-to pattern;Decreased stride length Gait velocity: decreased   General Gait Details: cues for sequence, to push RW, pt carrying RW at times    Stairs            Wheelchair Mobility    Modified Rankin (Stroke Patients Only)       Balance                                    Cognition                            Exercises   Total Knee Replacement TE's 10 reps B LE ankle pumps 10 reps towel squeezes 10 reps knee presses 10 reps heel slides  10 reps SAQ's 10 reps SLR's 10 reps ABD Followed by  ICE     General Comments        Pertinent Vitals/Pain      Home Living                      Prior Function            PT Goals (current goals can now be found in the care plan section) Progress towards PT goals: Progressing toward goals    Frequency  7X/week    PT Plan      Co-evaluation             End of Session Equipment Utilized During Treatment: Gait belt Activity Tolerance: Patient tolerated treatment well Patient left: in bed;with call bell/phone within reach     Time: 1030-1109 PT Time Calculation (min): 39 min  Charges:  $Gait Training: 8-22 mins $Therapeutic Exercise: 8-22 mins $Therapeutic Activity: 8-22 mins                    G Codes:      [name]  PTA [facility]  Acute  Rehab Pager      [phone]

## 2014-06-03 NOTE — Progress Notes (Signed)
Notified Ralene Batheracy Shuford PA about pt Blood Glucose of 318. She stated to recheck in AM. No new orders given. Will continue to monitor.

## 2014-06-03 NOTE — Progress Notes (Signed)
Physical Therapy Treatment Patient Details Name: Marcus QuanWilliam L Joseph MRN: [id number] DOB: 1970/10/21 Today's Date: 06/03/2014    History of Present Illness s/p L TKA    PT Comments    POD # 1 pm session.  Pt plans to DC to home today so performed stairs with spouse this session.  Follow Up Recommendations  Home health PT     Equipment Recommendations  None recommended by PT (issued one crutch)    Recommendations for Other Services       Precautions / Restrictions      Mobility  Bed Mobility               General bed mobility comments: pt OOB in recliner  Transfers Overall transfer level: Modified independent Equipment used: Rolling walker (2 wheeled) Transfers: Sit to/from Stand Sit to Stand: Modified independent (Device/Increase time)         General transfer comment: good use of hands and good safety cognition  Ambulation/Gait Ambulation/Gait assistance: Supervision Ambulation Distance (Feet): 225 Feet Assistive device: Rolling walker (2 wheeled) Gait Pattern/deviations: Step-to pattern;Decreased step length - left;Decreased step length - right;Trunk flexed Gait velocity: decreased   General Gait Details: one cues for sequence, to push RW, pt carrying RW at times    Stairs Stairs: Yes Stairs assistance: Min guard Stair Management: One rail Right;Step to pattern;Forwards;With crutches Number of Stairs: 4 General stair comments: with spouse pt demonstrated and instructed on proper sequencing  Wheelchair Mobility    Modified Rankin (Stroke Patients Only)       Balance                                    Cognition                            Exercises      General Comments        Pertinent Vitals/Pain      Home Living                      Prior Function            PT Goals (current goals can now be found in the care plan section) Progress towards PT goals: Progressing toward goals     Frequency  7X/week    PT Plan      Co-evaluation             End of Session Equipment Utilized During Treatment: Gait belt Activity Tolerance: Patient tolerated treatment well Patient left: in chair;with call bell/phone within reach;with family/visitor present     Time: 1510-1535 PT Time Calculation (min): 25 min  Charges:  $Gait Training: 8-22 mins $Therapeutic Activity: 8-22 mins                    G Codes:      Felecia ShellingLori Mahnoor Mathisen  PTA [facility]  Acute  Rehab Pager      [phone]
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
## 2014-09-14 ENCOUNTER — Encounter (HOSPITAL_BASED_OUTPATIENT_CLINIC_OR_DEPARTMENT_OTHER): Payer: Self-pay

## 2014-09-14 ENCOUNTER — Emergency Department (HOSPITAL_BASED_OUTPATIENT_CLINIC_OR_DEPARTMENT_OTHER)
Admission: EM | Admit: 2014-09-14 | Discharge: 2014-09-14 | Disposition: A | Discharge status: Home / Self Care | Payer: BLUE CROSS/BLUE SHIELD | Attending: Emergency Medicine | Admitting: Emergency Medicine

## 2014-09-14 ENCOUNTER — Emergency Department (HOSPITAL_BASED_OUTPATIENT_CLINIC_OR_DEPARTMENT_OTHER): Payer: BLUE CROSS/BLUE SHIELD

## 2014-09-14 DIAGNOSIS — Z862 Personal history of diseases of the blood and blood-forming organs and certain disorders involving the immune mechanism: Secondary | ICD-10-CM | POA: Diagnosis not present

## 2014-09-14 DIAGNOSIS — R05 Cough: Secondary | ICD-10-CM

## 2014-09-14 DIAGNOSIS — R059 Cough, unspecified: Secondary | ICD-10-CM

## 2014-09-14 DIAGNOSIS — J069 Acute upper respiratory infection, unspecified: Secondary | ICD-10-CM | POA: Diagnosis not present

## 2014-09-14 DIAGNOSIS — Z7951 Long term (current) use of inhaled steroids: Secondary | ICD-10-CM | POA: Insufficient documentation

## 2014-09-14 DIAGNOSIS — M1990 Unspecified osteoarthritis, unspecified site: Secondary | ICD-10-CM | POA: Diagnosis not present

## 2014-09-14 DIAGNOSIS — Z8719 Personal history of other diseases of the digestive system: Secondary | ICD-10-CM | POA: Insufficient documentation

## 2014-09-14 DIAGNOSIS — Z8679 Personal history of other diseases of the circulatory system: Secondary | ICD-10-CM | POA: Insufficient documentation

## 2014-09-14 DIAGNOSIS — E119 Type 2 diabetes mellitus without complications: Secondary | ICD-10-CM | POA: Insufficient documentation

## 2014-09-14 MED ORDER — GUAIFENESIN ER 1200 MG PO TB12
1.0000 | ORAL_TABLET | Freq: Two times a day (BID) | ORAL | Status: DC
Start: 2014-09-14 — End: 2014-10-04

## 2014-09-14 MED ORDER — BENZONATATE 100 MG PO CAPS: 100.0000 mg | ORAL_CAPSULE | Freq: Three times a day (TID) | ORAL | Status: DC

## 2014-09-14 NOTE — Discharge Instructions (Signed)

## 2014-09-14 NOTE — ED Notes (Signed)
MD at bedside. 
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
## 2014-09-14 NOTE — ED Notes (Addendum)
Patient here with dry cough and congestion x 2 weeks with intermittent chills. Non-smoker. Describes the cough as dry but getting up some thick yellow soutum

## 2014-09-30 ENCOUNTER — Emergency Department (HOSPITAL_BASED_OUTPATIENT_CLINIC_OR_DEPARTMENT_OTHER): Payer: BLUE CROSS/BLUE SHIELD

## 2014-09-30 ENCOUNTER — Inpatient Hospital Stay (HOSPITAL_BASED_OUTPATIENT_CLINIC_OR_DEPARTMENT_OTHER)
Admission: EM | Admit: 2014-09-30 | Discharge: 2014-10-04 | DRG: 176 | Disposition: A | Discharge status: Home / Self Care | Payer: BLUE CROSS/BLUE SHIELD | Attending: Internal Medicine | Admitting: Internal Medicine

## 2014-09-30 ENCOUNTER — Encounter (HOSPITAL_BASED_OUTPATIENT_CLINIC_OR_DEPARTMENT_OTHER): Payer: Self-pay | Admitting: *Deleted

## 2014-09-30 DIAGNOSIS — L299 Pruritus, unspecified: Secondary | ICD-10-CM | POA: Diagnosis present

## 2014-09-30 DIAGNOSIS — I82411 Acute embolism and thrombosis of right femoral vein: Secondary | ICD-10-CM | POA: Diagnosis present

## 2014-09-30 DIAGNOSIS — K219 Gastro-esophageal reflux disease without esophagitis: Secondary | ICD-10-CM | POA: Diagnosis present

## 2014-09-30 DIAGNOSIS — Z888 Allergy status to other drugs, medicaments and biological substances status: Secondary | ICD-10-CM | POA: Diagnosis not present

## 2014-09-30 DIAGNOSIS — Z6841 Body Mass Index (BMI) 40.0 and over, adult: Secondary | ICD-10-CM

## 2014-09-30 DIAGNOSIS — R778 Other specified abnormalities of plasma proteins: Secondary | ICD-10-CM

## 2014-09-30 DIAGNOSIS — I82441 Acute embolism and thrombosis of right tibial vein: Secondary | ICD-10-CM | POA: Diagnosis present

## 2014-09-30 DIAGNOSIS — D649 Anemia, unspecified: Secondary | ICD-10-CM | POA: Diagnosis present

## 2014-09-30 DIAGNOSIS — I2699 Other pulmonary embolism without acute cor pulmonale: Principal | ICD-10-CM | POA: Diagnosis present

## 2014-09-30 DIAGNOSIS — R0600 Dyspnea, unspecified: Secondary | ICD-10-CM

## 2014-09-30 DIAGNOSIS — E1165 Type 2 diabetes mellitus with hyperglycemia: Secondary | ICD-10-CM | POA: Diagnosis present

## 2014-09-30 DIAGNOSIS — R310 Gross hematuria: Secondary | ICD-10-CM | POA: Diagnosis present

## 2014-09-30 DIAGNOSIS — G43909 Migraine, unspecified, not intractable, without status migrainosus: Secondary | ICD-10-CM | POA: Diagnosis present

## 2014-09-30 DIAGNOSIS — Z86711 Personal history of pulmonary embolism: Secondary | ICD-10-CM | POA: Diagnosis present

## 2014-09-30 DIAGNOSIS — M179 Osteoarthritis of knee, unspecified: Secondary | ICD-10-CM | POA: Diagnosis present

## 2014-09-30 DIAGNOSIS — R748 Abnormal levels of other serum enzymes: Secondary | ICD-10-CM | POA: Diagnosis present

## 2014-09-30 DIAGNOSIS — Z794 Long term (current) use of insulin: Secondary | ICD-10-CM | POA: Diagnosis not present

## 2014-09-30 DIAGNOSIS — R05 Cough: Secondary | ICD-10-CM | POA: Diagnosis present

## 2014-09-30 DIAGNOSIS — I82431 Acute embolism and thrombosis of right popliteal vein: Secondary | ICD-10-CM | POA: Diagnosis present

## 2014-09-30 DIAGNOSIS — Z96652 Presence of left artificial knee joint: Secondary | ICD-10-CM | POA: Diagnosis present

## 2014-09-30 DIAGNOSIS — R319 Hematuria, unspecified: Secondary | ICD-10-CM | POA: Insufficient documentation

## 2014-09-30 DIAGNOSIS — R7989 Other specified abnormal findings of blood chemistry: Secondary | ICD-10-CM

## 2014-09-30 DIAGNOSIS — E119 Type 2 diabetes mellitus without complications: Secondary | ICD-10-CM

## 2014-09-30 DIAGNOSIS — R739 Hyperglycemia, unspecified: Secondary | ICD-10-CM

## 2014-09-30 HISTORY — DX: Other pulmonary embolism without acute cor pulmonale: I26.99

## 2014-09-30 LAB — CBC WITH DIFFERENTIAL/PLATELET
Basophils Absolute: 0 10*3/uL (ref 0.0–0.1)
Basophils Relative: 0 % (ref 0–1)
EOS ABS: 0.1 10*3/uL (ref 0.0–0.7)
Eosinophils Relative: 1 % (ref 0–5)
HEMATOCRIT: 42.8 % (ref 39.0–52.0)
HEMOGLOBIN: 14.4 g/dL (ref 13.0–17.0)
LYMPHS ABS: 3.7 10*3/uL (ref 0.7–4.0)
LYMPHS PCT: 41 % (ref 12–46)
MCH: 27.4 pg (ref 26.0–34.0)
MCHC: 33.6 g/dL (ref 30.0–36.0)
MCV: 81.5 fL (ref 78.0–100.0)
MONO ABS: 0.8 10*3/uL (ref 0.1–1.0)
MONOS PCT: 9 % (ref 3–12)
NEUTROS ABS: 4.5 10*3/uL (ref 1.7–7.7)
NEUTROS PCT: 49 % (ref 43–77)
PLATELETS: 301 10*3/uL (ref 150–400)
RBC: 5.25 MIL/uL (ref 4.22–5.81)
RDW: 13.4 % (ref 11.5–15.5)
WBC: 9.2 10*3/uL (ref 4.0–10.5)

## 2014-09-30 LAB — BASIC METABOLIC PANEL
ANION GAP: 8 (ref 5–15)
BUN: 13 mg/dL (ref 6–23)
CO2: 25 mmol/L (ref 19–32)
Calcium: 8.7 mg/dL (ref 8.4–10.5)
Chloride: 99 mmol/L (ref 96–112)
Creatinine, Ser: 1.07 mg/dL (ref 0.50–1.35)
GFR, EST NON AFRICAN AMERICAN: 84 mL/min — AB (ref >90)
GLUCOSE: 435 mg/dL — AB (ref 70–99)
POTASSIUM: 3.8 mmol/L (ref 3.5–5.1)
Sodium: 132 mmol/L — ABNORMAL LOW (ref 135–145)

## 2014-09-30 LAB — D-DIMER, QUANTITATIVE: D-Dimer, Quant: 3.78 ug/mL-FEU — ABNORMAL HIGH (ref 0.00–0.48)

## 2014-09-30 LAB — BRAIN NATRIURETIC PEPTIDE: B NATRIURETIC PEPTIDE 5: 25.3 pg/mL (ref 0.0–100.0)

## 2014-09-30 LAB — TROPONIN I: TROPONIN I: 0.08 ng/mL — AB (ref <0.031)

## 2014-09-30 MED ORDER — IOHEXOL 350 MG/ML SOLN
100.0000 mL | Freq: Once | INTRAVENOUS | Status: AC | PRN
  Administered 2014-09-30: 100 mL via INTRAVENOUS

## 2014-09-30 MED ORDER — INSULIN REGULAR HUMAN 100 UNIT/ML IJ SOLN
8.0000 [IU] | Freq: Once | INTRAMUSCULAR | Status: AC
  Administered 2014-09-30: 8 [IU] via SUBCUTANEOUS
  Filled 2014-09-30: qty 1

## 2014-09-30 MED ORDER — ENOXAPARIN SODIUM 150 MG/ML ~~LOC~~ SOLN
1.0000 mg/kg | Freq: Once | SUBCUTANEOUS | Status: AC
  Administered 2014-09-30: 145 mg via SUBCUTANEOUS
  Filled 2014-09-30: qty 1

## 2014-09-30 NOTE — Evaluation (Signed)
Transfer: [facility] to [facility] SD[facility] EDP: Madilyn Hookees Reason: Saddle Embolus PE, hyperglycemia   43 YO obese male. SOB x 1 day. Recent URI. Seen in ER for same. Tachycardic, BP 100s-130s, satting 97% on 2L [place]. CT Angio Large volume bilateral acute pulmonary emboli with evidence of right heart strain and saddle embolus configuration. Trop 0.08.  EKG accelerated junctional rhythm. Glu 435. Bicarb 25. Given lovenox, insulin.  Asked EDP to discuss case w/ PCCM EDP discussed case w/ PCCM ([place]). They will consult.  [facility]

## 2014-09-30 NOTE — ED Notes (Signed)
Sob this afternoon. Recent cold. Was seen last week for same.
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
## 2014-10-01 ENCOUNTER — Encounter (HOSPITAL_COMMUNITY): Payer: Self-pay | Admitting: General Practice

## 2014-10-01 DIAGNOSIS — I2699 Other pulmonary embolism without acute cor pulmonale: Secondary | ICD-10-CM

## 2014-10-01 DIAGNOSIS — R7989 Other specified abnormal findings of blood chemistry: Secondary | ICD-10-CM

## 2014-10-01 DIAGNOSIS — E119 Type 2 diabetes mellitus without complications: Secondary | ICD-10-CM

## 2014-10-01 DIAGNOSIS — E1165 Type 2 diabetes mellitus with hyperglycemia: Secondary | ICD-10-CM

## 2014-10-01 DIAGNOSIS — R739 Hyperglycemia, unspecified: Secondary | ICD-10-CM

## 2014-10-01 DIAGNOSIS — R778 Other specified abnormalities of plasma proteins: Secondary | ICD-10-CM

## 2014-10-01 DIAGNOSIS — Z86711 Personal history of pulmonary embolism: Secondary | ICD-10-CM | POA: Diagnosis present

## 2014-10-01 HISTORY — DX: Other pulmonary embolism without acute cor pulmonale: I26.99

## 2014-10-01 LAB — GLUCOSE, CAPILLARY
GLUCOSE-CAPILLARY: 319 mg/dL — AB (ref 70–99)
GLUCOSE-CAPILLARY: 256 mg/dL — AB (ref 70–99)
GLUCOSE-CAPILLARY: 273 mg/dL — AB (ref 70–99)
Glucose-Capillary: 327 mg/dL — ABNORMAL HIGH (ref 70–99)
Glucose-Capillary: 247 mg/dL — ABNORMAL HIGH (ref 70–99)
Glucose-Capillary: 221 mg/dL — ABNORMAL HIGH (ref 70–99)

## 2014-10-01 LAB — COMPREHENSIVE METABOLIC PANEL
ALT: 28 U/L (ref 0–53)
ANION GAP: 7 (ref 5–15)
AST: 21 U/L (ref 0–37)
Albumin: 3.3 g/dL — ABNORMAL LOW (ref 3.5–5.2)
Alkaline Phosphatase: 64 U/L (ref 39–117)
BUN: 9 mg/dL (ref 6–23)
CALCIUM: 8.5 mg/dL (ref 8.4–10.5)
CHLORIDE: 100 mmol/L (ref 96–112)
CO2: 26 mmol/L (ref 19–32)
CREATININE: 0.95 mg/dL (ref 0.50–1.35)
GLUCOSE: 362 mg/dL — AB (ref 70–99)
Potassium: 4.2 mmol/L (ref 3.5–5.1)
SODIUM: 133 mmol/L — AB (ref 135–145)
Total Bilirubin: 0.3 mg/dL (ref 0.3–1.2)
Total Protein: 6.8 g/dL (ref 6.0–8.3)

## 2014-10-01 LAB — CBC WITH DIFFERENTIAL/PLATELET
Basophils Absolute: 0 10*3/uL (ref 0.0–0.1)
Basophils Relative: 0 % (ref 0–1)
EOS PCT: 1 % (ref 0–5)
Eosinophils Absolute: 0.1 10*3/uL (ref 0.0–0.7)
HCT: 40.8 % (ref 39.0–52.0)
Hemoglobin: 13.9 g/dL (ref 13.0–17.0)
Lymphocytes Relative: 52 % — ABNORMAL HIGH (ref 12–46)
Lymphs Abs: 4 10*3/uL (ref 0.7–4.0)
MCH: 27.5 pg (ref 26.0–34.0)
MCHC: 34.1 g/dL (ref 30.0–36.0)
MCV: 80.8 fL (ref 78.0–100.0)
Monocytes Absolute: 0.6 10*3/uL (ref 0.1–1.0)
Monocytes Relative: 7 % (ref 3–12)
Neutro Abs: 3.1 10*3/uL (ref 1.7–7.7)
Neutrophils Relative %: 40 % — ABNORMAL LOW (ref 43–77)
Platelets: 265 10*3/uL (ref 150–400)
RBC: 5.05 MIL/uL (ref 4.22–5.81)
RDW: 13.4 % (ref 11.5–15.5)
WBC: 7.7 10*3/uL (ref 4.0–10.5)

## 2014-10-01 LAB — CBG MONITORING, ED: GLUCOSE-CAPILLARY: 324 mg/dL — AB (ref 70–99)

## 2014-10-01 LAB — TROPONIN I: Troponin I: 0.15 ng/mL — ABNORMAL HIGH (ref <0.031)

## 2014-10-01 MED ORDER — LIVING WELL WITH DIABETES BOOK
Freq: Once | Status: AC
  Administered 2014-10-01: 18:00:00
  Filled 2014-10-01: qty 1

## 2014-10-01 MED ORDER — SODIUM CHLORIDE 0.9 % IJ SOLN
3.0000 mL | Freq: Two times a day (BID) | INTRAMUSCULAR | Status: DC
  Administered 2014-10-02 – 2014-10-04 (×4): 3 mL via INTRAVENOUS

## 2014-10-01 MED ORDER — DIPHENHYDRAMINE HCL 25 MG PO CAPS
25.0000 mg | ORAL_CAPSULE | Freq: Four times a day (QID) | ORAL | Status: DC | PRN
  Administered 2014-10-01 – 2014-10-02 (×4): 25 mg via ORAL
  Filled 2014-10-01 (×5): qty 1

## 2014-10-01 MED ORDER — DIPHENHYDRAMINE HCL 50 MG/ML IJ SOLN
25.0000 mg | Freq: Once | INTRAMUSCULAR | Status: AC
  Administered 2014-10-01: 25 mg via INTRAVENOUS
  Filled 2014-10-01: qty 1

## 2014-10-01 MED ORDER — ENOXAPARIN SODIUM 150 MG/ML ~~LOC~~ SOLN
140.0000 mg | Freq: Two times a day (BID) | SUBCUTANEOUS | Status: DC
  Administered 2014-10-01 – 2014-10-02 (×3): 140 mg via SUBCUTANEOUS
  Filled 2014-10-01 (×6): qty 1

## 2014-10-01 MED ORDER — SODIUM CHLORIDE 0.9 % IV SOLN
INTRAVENOUS | Status: DC
  Administered 2014-10-01 (×2): via INTRAVENOUS

## 2014-10-01 MED ORDER — INSULIN ASPART 100 UNIT/ML ~~LOC~~ SOLN
0.0000 [IU] | Freq: Three times a day (TID) | SUBCUTANEOUS | Status: DC
Start: 2014-10-01 — End: 2014-10-04
  Administered 2014-10-01: 8 [IU] via SUBCUTANEOUS
  Administered 2014-10-01: 5 [IU] via SUBCUTANEOUS
  Administered 2014-10-02: 8 [IU] via SUBCUTANEOUS
  Administered 2014-10-02: 3 [IU] via SUBCUTANEOUS
  Administered 2014-10-02: 8 [IU] via SUBCUTANEOUS
  Administered 2014-10-03 (×3): 5 [IU] via SUBCUTANEOUS
  Administered 2014-10-04 (×2): 8 [IU] via SUBCUTANEOUS

## 2014-10-01 MED ORDER — INSULIN STARTER KIT- PEN NEEDLES (ENGLISH)
1.0000 | Freq: Once | Status: AC
  Administered 2014-10-01: 1
  Filled 2014-10-01: qty 1

## 2014-10-01 MED ORDER — INSULIN GLARGINE 100 UNIT/ML ~~LOC~~ SOLN: 10.0000 [IU] | Freq: Every day | SUBCUTANEOUS | Status: DC

## 2014-10-01 MED ORDER — INSULIN ASPART 100 UNIT/ML ~~LOC~~ SOLN
0.0000 [IU] | SUBCUTANEOUS | Status: DC
  Administered 2014-10-01: 8 [IU] via SUBCUTANEOUS
  Administered 2014-10-01: 11 [IU] via SUBCUTANEOUS

## 2014-10-01 MED ORDER — INSULIN ASPART 100 UNIT/ML ~~LOC~~ SOLN
0.0000 [IU] | Freq: Every day | SUBCUTANEOUS | Status: DC
  Administered 2014-10-01: 2 [IU] via SUBCUTANEOUS
  Administered 2014-10-02: 3 [IU] via SUBCUTANEOUS
  Administered 2014-10-03: 2 [IU] via SUBCUTANEOUS

## 2014-10-01 MED ORDER — INSULIN GLARGINE 100 UNIT/ML ~~LOC~~ SOLN
20.0000 [IU] | Freq: Every day | SUBCUTANEOUS | Status: DC
  Administered 2014-10-01 – 2014-10-02 (×2): 20 [IU] via SUBCUTANEOUS
  Filled 2014-10-01 (×2): qty 0.2

## 2014-10-01 NOTE — Progress Notes (Signed)
Inpatient Diabetes Program Recommendations  AACE/ADA: New Consensus Statement on Inpatient Glycemic Control (2013)  Target Ranges:  Prepandial:   less than 140 mg/dL      Peak postprandial:   less than 180 mg/dL (1-2 hours)      Critically ill patients:  140 - 180 mg/dL   Reason for Assessment:  Results for Erlene QuanSPRINGS, Leilan L (MRN [id number]) as of 10/01/2014 11:36  Ref. Range 10/01/2014 01:56 10/01/2014 03:31 10/01/2014 06:32 10/01/2014 08:06  Glucose-Capillary Latest Range: 70-99 mg/dL 409324 (H) 811319 (H) 914327 (H) 256 (H)    Diabetes history: Hyperglycemia on admit but no history of diabetes documented Outpatient Diabetes medications: None noted Current orders for Inpatient glycemic control:  Novolog moderate q 4 hours.  Note that A1C in progress.  Will follow.  May consider adding basal insulin while patient is in the hospital.  Consider Lantus 28 units daily (0.2 units/kg) to meet basal insulin needs.   Thanks, Beryl MeagerJenny Haruye Lainez, RN, BC-ADM Inpatient Diabetes Coordinator Pager [phone]

## 2014-10-01 NOTE — Progress Notes (Addendum)
[facility] notifed that pt has c/o of itching with insulin awaiting response.New orders were received  Ilean SkillVeronica Aanvi Voyles LPN

## 2014-10-01 NOTE — ED Notes (Signed)
Pt mom called wanting to know when we were going to get pt to a bed in the hospital

## 2014-10-01 NOTE — Consult Note (Signed)
Name: Marcus Joseph MRN: [id number] DOB: 1971-08-19    ADMISSION DATE:  09/30/2014 CONSULTATION DATE:  10/01/2014  REFERRING MD :  [facility] EDP  CHIEF COMPLAINT:  SOB  BRIEF PATIENT DESCRIPTION: 44 year old male with DM presented to [facility] ED 2/2 with SOB. CTA found saddle PE with evidence of right heart strain. Transferred to [facility] for stepdown admission. PCCM to consult.   SIGNIFICANT EVENTS   STUDIES:  CTA chest 2/2: Large volume bilateral acute pulmonary emboli with evidence of right heart strain and saddle embolus configuration.  HISTORY OF PRESENT ILLNESS:  44 y.o. male with PMH as below, which includes borderline DM. Nonsmoker. He presented to [name] ED 2/2 complaining of SOB. 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 He presented about one week prior to today for  Cough/SOB, was treated for URI and discharged. He has had upper respiraory symptoms for 3 weeks prior to presentation 2/2. In ED he c/o SOB, chest pain, cough, and occasional post-tussive emesis. Nothing makes symptoms better or worse. He denies a history of PE or DVT. CTA of the chest was performed and found saddle PE with large clot burden. He is to be transferred to [name] for admission under hospitalist team. PCCM to consult.   PAST MEDICAL HISTORY :   has a past medical history of Prediabetes (on 04-28-14); GERD (gastroesophageal reflux disease); Headache(784.0); Arthritis; Anemia (age 84 or 7); Head problem; and Diabetes mellitus without complication.  has past surgical history that includes Knee arthroscopy (Bilateral); plastic surgery to head (age 89 or 81); and Total knee arthroplasty (Left, 06/02/2014). Prior to Admission medications   Medication Sig Start Date End Date Taking? Authorizing Provider  benzonatate (TESSALON) 100 MG capsule Take 1 capsule (100 mg total) by mouth every 8 (eight) hours. 09/14/14   Linwood Dibbles, MD  fluticasone (FLONASE) 50 MCG/ACT nasal spray Place 2 sprays into the nose daily. 09/07/11 09/06/12   Delanna Notice, MD  Guaifenesin 1200 MG TB12 Take 1 tablet (1,200 mg total) by mouth 2 times daily at 12 noon and 4 pm. 09/14/14   Linwood Dibbles, MD  ibuprofen (ADVIL,MOTRIN) 600 MG tablet Take 600 mg by mouth every 6 (six) hours as needed.    Historical Provider, MD   Allergies  Allergen Reactions  . Percocet [Oxycodone-Acetaminophen] Hives and Itching    FAMILY HISTORY:  family history is not on file. SOCIAL HISTORY:  reports that he has never smoked. He has never used smokeless tobacco. He reports that he drinks alcohol. He reports that he does not use illicit drugs.  REVIEW OF SYSTEMS:   Bolds are positive  Constitutional: weight loss (intentional), gain, night sweats, Fevers, chills, fatigue .  HEENT: headaches, Sore throat, sneezing, nasal congestion, post nasal drip, Difficulty swallowing, Tooth/dental problems, visual complaints visual changes, ear ache CV:  chest pain, radiates: ,Orthopnea, PND, swelling in lower extremities, dizziness, palpitations, syncope.  GI  heartburn, indigestion, abdominal pain, nausea, post-tussive vomiting, diarrhea, change in bowel habits, loss of appetite, bloody stools.  Resp: cough, productive: , hemoptysis, dyspnea, only on exertion, chest pain with cough only, pleuritic.  Skin: rash or itching or icterus GU: dysuria, change in color of urine, urgency or frequency. flank pain, hematuria.  MS: joint pain or swelling. decreased range of motion. Intermittent cramping of RLE Psych: change in mood or affect. depression or anxiety.  Neuro: difficulty with speech, weakness, numbness, ataxia.  SUBJECTIVE:   VITAL SIGNS: Temp:  [98.6 F (37 C)] 98.6 F (37  C) (02/02 1757) Pulse Rate:  [92-111] 92 (02/03 0030) Resp:  [15-30] 17 (02/03 0030) BP: (99-132)/(64-98) 110/67 mmHg (02/03 0030) SpO2:  [96 %-100 %] 100 % (02/03 0030) Weight:  [147.419 kg (325 lb)] 147.419 kg (325 lb) (02/02 1757)  PHYSICAL EXAMINATION: General:  Morbidly obese male in NAD on 2L  [place] Neuro:  Alert, oriented, non-focal HEENT:  [place]/AT, PERRL, no JVD noted Cardiovascular:  RRR, no MRG Lungs:  Distant, clear bilateral breath sounds. WOB wnl Abdomen:  Obese, soft, non-tender, non-distended Musculoskeletal:  No acute deformity or ROM limitation Skin: Grossly intact.   Recent Labs Lab 09/30/14 1820  NA 132*  K 3.8  CL 99  CO2 25  BUN 13  CREATININE 1.07  GLUCOSE 435*    Recent Labs Lab 09/30/14 1820  HGB 14.4  HCT 42.8  WBC 9.2  PLT 301   Dg Chest 2 View  09/30/2014   CLINICAL DATA:  Acute onset of cough, shortness of breath and difficulty breathing. Initial encounter.  EXAM: CHEST  2 VIEW  COMPARISON:  Chest radiograph 08/30/2015 2016  FINDINGS: The lungs are well-aerated. Mild vascular congestion and vascular crowding are seen. There is no evidence of focal opacification, pleural effusion or pneumothorax.  The heart is normal in size; the mediastinal contour is within normal limits. No acute osseous abnormalities are seen.  IMPRESSION: Mild vascular congestion noted.  Lungs remain grossly clear.   Electronically Signed   By: Roanna Raider M.D.   On: 09/30/2014 18:42   Ct Angio Chest Pe W/cm &/or Wo Cm  09/30/2014   CLINICAL DATA:  Shortness of breath, difficulty breathing. Elevated D-dimer.  EXAM: CT ANGIOGRAPHY CHEST WITH CONTRAST  TECHNIQUE: Multidetector CT imaging of the chest was performed using the standard protocol during bolus administration of intravenous contrast. Multiplanar CT image reconstructions and MIPs were obtained to evaluate the vascular anatomy.  CONTRAST:  OMNIPAQUE IOHEXOL 350 MG/ML SOLN  COMPARISON:  Chest radiograph same day  FINDINGS: There is large volume acute pulmonary embolism extending to all main pulmonary arterial segments, with central saddle type configuration. Evidence of right heart strain is identified, with RV:LV ratio of 1.2. Detail is minimally obscured by early bolus timing and likely pressure effect due to right heart  strain but this is not felt to significantly affect the diagnostic quality or findings on the current exam. Heart size is normal.  Aorta is normal in caliber. No lymphadenopathy. No pericardial or pleural effusion.  The lungs are clear.  Central airways are patent.  No acute osseous abnormality.  Review of the MIP images confirms the above findings.  IMPRESSION: Large volume bilateral acute pulmonary emboli with evidence of right heart strain and saddle embolus configuration. Critical Value/emergent results were called by telephone at the time of interpretation on 09/30/2014 at 8:16 pm to Dr. Tilden Fossa , who verbally acknowledged these results.   Electronically Signed   By: Christiana Pellant M.D.   On: 09/30/2014 20:16    ASSESSMENT / PLAN:  Pulmonary Embolism, saddle  - suspect provoked in setting of recent knee surgery - evidence of R heart stain on CT Elevated troponin - Supplemental O2 to maintain SpO2 greater than 92% - Continue therapeutic dose Lovenox per pharmacy - Echo to evaluate R heart function - BLE dopplers, if mobile clot consider filter - If poor R heart function on echo, consider catheter directed thrombolysis.  - Continue to cycle troponin  DM - Per primary   Joneen Roach, AGACNP-BC [place]  [facility] Pager [phone] or [phone]) [phone]   ATTENDING NOTE: I have personally reviewed patient's available data, including medical history, events of note, physical examination and test results as part of my evaluation. I have discussed with resident/NP and other careteam providers such as pharmacist, RN and RRT & co-ordinated with consultants. In addition, I personally evaluated patient and elicited key history of dyspnea and cough intermittently for 3 weeks, left knee replacement in October 2015, history of charley horse cramps in right leg exam findings of clear lungs, left knee scar, no leg swelling & labs showing normal renal function and hemoglobin. Mild  positive troponin CT angiogram reviewed- large volume bilateral acute pulmonary embolism  Impression-submassive primary embolism Does not need lytics, RV/LV ratio 1.2 If remains hemodynamically stable, can change to Lovenox, and start Novel anticoagulant in 24 hours. He will need further discussion about Coumadin versus newer agents. Suggest duration of anticoagulation 6 months, since he had a clear risk factor Rest per NP/medical resident whose note is outlined above and that I agree with and edited in full.   Oretha MilchALVA,Sherrill Buikema V. MD

## 2014-10-01 NOTE — Progress Notes (Signed)
VASCULAR LAB PRELIMINARY  PRELIMINARY  PRELIMINARY  PRELIMINARY  Bilateral lower extremity venous duplex completed.    Preliminary report:  Right:  DVT noted in the posterior branch of the posterior tibial vein coursing through the popliteal and distal femoral veins. Also note was DVT of several proximal calf veins. .  No evidence of superficial thrombosis.  No Baker's cyst. Left:  No evidence of DVT, superficial thrombosis, or Baker's cyst.  Marcus Joseph, RVS 10/01/2014, 10:32 AM
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
## 2014-10-01 NOTE — Progress Notes (Signed)
Utilization review completed.  

## 2014-10-01 NOTE — ED Notes (Signed)
Talked w dr deterdine  She is going to check about pt going to [facility] stepdown

## 2014-10-01 NOTE — ED Provider Notes (Signed)
140 a.m. patient alert Glasgow Coma Score 15 resting comfortably states he is breathing well. Speaks in paragraphs no distress. Case success with Dr. Kathie RhodesS. Newton. [facility] Results for orders placed or performed during the hospital encounter of 09/30/14  Basic metabolic panel  Result Value Ref Range   Sodium 132 (L) 135 - 145 mmol/L   Potassium 3.8 3.5 - 5.1 mmol/L   Chloride 99 96 - 112 mmol/L   CO2 25 19 - 32 mmol/L   Glucose, Bld 435 (H) 70 - 99 mg/dL   BUN 13 6 - 23 mg/dL   Creatinine, Ser 1.611.07 0.50 - 1.35 mg/dL   Calcium 8.7 8.4 - 09.610.5 mg/dL   GFR calc non Af Amer 84 (L) >90 mL/min   GFR calc Af Amer >90 >90 mL/min   Anion gap 8 5 - 15  CBC with Differential  Result Value Ref Range   WBC 9.2 4.0 - 10.5 K/uL   RBC 5.25 4.22 - 5.81 MIL/uL   Hemoglobin 14.4 13.0 - 17.0 g/dL   HCT 04.542.8 40.939.0 - 81.152.0 %   MCV 81.5 78.0 - 100.0 fL   MCH 27.4 26.0 - 34.0 pg   MCHC 33.6 30.0 - 36.0 g/dL   RDW 91.413.4 78.211.5 - 95.615.5 %   Platelets 301 150 - 400 K/uL   Neutrophils Relative % 49 43 - 77 %   Neutro Abs 4.5 1.7 - 7.7 K/uL   Lymphocytes Relative 41 12 - 46 %   Lymphs Abs 3.7 0.7 - 4.0 K/uL   Monocytes Relative 9 3 - 12 %   Monocytes Absolute 0.8 0.1 - 1.0 K/uL   Eosinophils Relative 1 0 - 5 %   Eosinophils Absolute 0.1 0.0 - 0.7 K/uL   Basophils Relative 0 0 - 1 %   Basophils Absolute 0.0 0.0 - 0.1 K/uL  Troponin I  Result Value Ref Range   Troponin I 0.08 (H) <0.031 ng/mL  D-dimer, quantitative  Result Value Ref Range   D-Dimer, Quant 3.78 (H) 0.00 - 0.48 ug/mL-FEU  Brain natriuretic peptide  Result Value Ref Range   B Natriuretic Peptide 25.3 0.0 - 100.0 pg/mL   Dg Chest 2 View  09/30/2014   CLINICAL DATA:  Acute onset of cough, shortness of breath and difficulty breathing. Initial encounter.  EXAM: CHEST  2 VIEW  COMPARISON:  Chest radiograph 08/30/2015 2016  FINDINGS: The lungs are well-aerated. Mild vascular congestion and vascular  crowding are seen. There is no evidence of focal opacification, pleural effusion or pneumothorax.  The heart is normal in size; the mediastinal contour is within normal limits. No acute osseous abnormalities are seen.  IMPRESSION: Mild vascular congestion noted.  Lungs remain grossly clear.   Electronically Signed   By: Roanna RaiderJeffery  Chang M.D.   On: 09/30/2014 18:42   Dg Chest 2 View  09/14/2014   CLINICAL DATA:  Initial encounter for cough for 2 weeks. Low grade fever.  EXAM: CHEST  2 VIEW  COMPARISON:  11/09/2009  FINDINGS: Midline trachea.  Normal heart size and mediastinal contours.  Sharp costophrenic angles.  No pneumothorax.  Clear lungs.  IMPRESSION: No active cardiopulmonary disease.   Electronically Signed   By: Jeronimo GreavesKyle  Talbot M.D.   On: 09/14/2014 09:41   Ct Angio Chest Pe W/cm &/or Wo Cm  09/30/2014   CLINICAL DATA:  Shortness of breath, difficulty breathing. Elevated D-dimer.  EXAM: CT ANGIOGRAPHY CHEST WITH CONTRAST  TECHNIQUE: Multidetector CT  imaging of the chest was performed using the standard protocol during bolus administration of intravenous contrast. Multiplanar CT image reconstructions and MIPs were obtained to evaluate the vascular anatomy.  CONTRAST:  OMNIPAQUE IOHEXOL 350 MG/ML SOLN  COMPARISON:  Chest radiograph same day  FINDINGS: There is large volume acute pulmonary embolism extending to all main pulmonary arterial segments, with central saddle type configuration. Evidence of right heart strain is identified, with RV:LV ratio of 1.2. Detail is minimally obscured by early bolus timing and likely pressure effect due to right heart strain but this is not felt to significantly affect the diagnostic quality or findings on the current exam. Heart size is normal.  Aorta is normal in caliber. No lymphadenopathy. No pericardial or pleural effusion.  The lungs are clear.  Central airways are patent.  No acute osseous abnormality.  Review of the MIP images confirms the above findings.   IMPRESSION: Large volume bilateral acute pulmonary emboli with evidence of right heart strain and saddle embolus configuration. Critical Value/emergent results were called by telephone at the time of interpretation on 09/30/2014 at 8:16 pm to Dr. Tilden Fossa , who verbally acknowledged these results.   Electronically Signed   By: Christiana Pellant M.D.   On: 09/30/2014 20:16     Doug Sou, MD 10/01/14 0200

## 2014-10-01 NOTE — Progress Notes (Signed)
Inpatient Diabetes Program Recommendations  AACE/ADA: New Consensus Statement on Inpatient Glycemic Control (2013)  Target Ranges:  Prepandial:   less than 140 mg/dL      Peak postprandial:   less than 180 mg/dL (1-2 hours)      Critically ill patients:  140 - 180 mg/dL   Spoke with patient regarding elevated blood sugars and new diagnosis of diabetes.  He states that he was told at the urgent care that he was borderline previously.  A1C is pending.  Briefly discussed new diagnosis.  Patient states that MD has told him that his has diabetes and will likely need insulin.  He states that he is "itching" from the insulin injections.  Reported patient complaints to RN.  Will order basic survival skill education for patient including diabetes videos, Living well with diabetes booklet, and OP diabetes education.  He needs to follow-up with PCP ASAP.  Will also order "insulin starter kit" .  Will follow-up with patient on 10/02/14.   Thanks, Jenny Simpson, RN, BC-ADM Inpatient Diabetes Coordinator Pager [phone]   

## 2014-10-01 NOTE — Progress Notes (Signed)
Echo complete. 10/01/2014 Marcus Joseph Marcus Joseph

## 2014-10-01 NOTE — Progress Notes (Signed)
PROGRESS NOTE  FARLEY CROOKER VWU:[id number] DOB: Jun 28, 1971 DOA: 09/30/2014 PCP: No PCP Per Patient  Assessment/Plan: Pulmonary Embolus -continue Lovenox [place] per CCM recommendation -will likely go home with Eliquis or Xarelto -await Echo -appreciate CCM consult DVT-RLE -duplex with DVT of R-popliteal, distal femoral and post tibial veins Diabetes Mellitus, uncontrolled -Start Lantus 20 units at bedtime -NovoLog sliding scale -Await hemoglobin A1c -Diabetic education Elevated troponin -Secondary to pulmonary embolus  Morbid obesity -BMI 44.2 -nutrition eval  Family Communication:   Pt at beside Disposition Plan:   Home when medically stable       Procedures/Studies: Dg Chest 2 View  09/30/2014   CLINICAL DATA:  Acute onset of cough, shortness of breath and difficulty breathing. Initial encounter.  EXAM: CHEST  2 VIEW  COMPARISON:  Chest radiograph 08/30/2015 2016  FINDINGS: The lungs are well-aerated. Mild vascular congestion and vascular crowding are seen. There is no evidence of focal opacification, pleural effusion or pneumothorax.  The heart is normal in size; the mediastinal contour is within normal limits. No acute osseous abnormalities are seen.  IMPRESSION: Mild vascular congestion noted.  Lungs remain grossly clear.   Electronically Signed   By: Roanna Raider M.D.   On: 09/30/2014 18:42   Dg Chest 2 View  09/14/2014   CLINICAL DATA:  Initial encounter for cough for 2 weeks. Low grade fever.  EXAM: CHEST  2 VIEW  COMPARISON:  11/09/2009  FINDINGS: Midline trachea.  Normal heart size and mediastinal contours.  Sharp costophrenic angles.  No pneumothorax.  Clear lungs.  IMPRESSION: No active cardiopulmonary disease.   Electronically Signed   By: Jeronimo Greaves M.D.   On: 09/14/2014 09:41   Ct Angio Chest Pe W/cm &/or Wo Cm  09/30/2014   CLINICAL DATA:  Shortness of breath, difficulty breathing. Elevated D-dimer.  EXAM: CT ANGIOGRAPHY CHEST WITH CONTRAST   TECHNIQUE: Multidetector CT imaging of the chest was performed using the standard protocol during bolus administration of intravenous contrast. Multiplanar CT image reconstructions and MIPs were obtained to evaluate the vascular anatomy.  CONTRAST:  OMNIPAQUE IOHEXOL 350 MG/ML SOLN  COMPARISON:  Chest radiograph same day  FINDINGS: There is large volume acute pulmonary embolism extending to all main pulmonary arterial segments, with central saddle type configuration. Evidence of right heart strain is identified, with RV:LV ratio of 1.2. Detail is minimally obscured by early bolus timing and likely pressure effect due to right heart strain but this is not felt to significantly affect the diagnostic quality or findings on the current exam. Heart size is normal.  Aorta is normal in caliber. No lymphadenopathy. No pericardial or pleural effusion.  The lungs are clear.  Central airways are patent.  No acute osseous abnormality.  Review of the MIP images confirms the above findings.  IMPRESSION: Large volume bilateral acute pulmonary emboli with evidence of right heart strain and saddle embolus configuration. Critical Value/emergent results were called by telephone at the time of interpretation on 09/30/2014 at 8:16 pm to Dr. Tilden Fossa , who verbally acknowledged these results.   Electronically Signed   By: Christiana Pellant M.D.   On: 09/30/2014 20:16         Subjective: Patient is breathing better. He still has some intermittent chest discomfort particularly with coughing. Denies fevers, chills, hemoptysis, nausea, vomiting, diarrhea, abdominal pain.   Objective: Filed Vitals:   10/01/14 0105 10/01/14 0130 10/01/14 0200 10/01/14 0326  BP: 110/71 108/80 98/67  117/67  Pulse: 91 95 90 95  Temp:    98.1 F (36.7 C)  TempSrc:    Oral  Resp: 13 13 26 20   Height:      Weight:    140.6 kg (309 lb 15.5 oz)  SpO2: 97% 100% 99% 99%    Intake/Output Summary (Last 24 hours) at 10/01/14 1140 Last data  filed at 10/01/14 0700  Gross per 24 hour  Intake    860 ml  Output    400 ml  Net    460 ml   Weight change:  Exam:   General:  Pt is alert, follows commands appropriately, not in acute distress  HEENT: No icterus, No thrush, [place]/AT  Cardiovascular: RRR, S1/S2, no rubs, no gallops  Respiratory: CTA bilaterally, no wheezing, no crackles, no rhonchi  Abdomen: Soft/+BS, non tender, non distended, no guarding  Extremities: No edema, No lymphangitis, No petechiae, No rashes, no synovitis  Data Reviewed: Basic Metabolic Panel:  Recent Labs Lab 09/30/14 1820 10/01/14 0524  NA 132* 133*  K 3.8 4.2  CL 99 100  CO2 25 26  GLUCOSE 435* 362*  BUN 13 9  CREATININE 1.07 0.95  CALCIUM 8.7 8.5   Liver Function Tests:  Recent Labs Lab 10/01/14 0524  AST 21  ALT 28  ALKPHOS 64  BILITOT 0.3  PROT 6.8  ALBUMIN 3.3*   No results for input(s): LIPASE, AMYLASE in the last 168 hours. No results for input(s): AMMONIA in the last 168 hours. CBC:  Recent Labs Lab 09/30/14 1820 10/01/14 0524  WBC 9.2 7.7  NEUTROABS 4.5 3.1  HGB 14.4 13.9  HCT 42.8 40.8  MCV 81.5 80.8  PLT 301 265   Cardiac Enzymes:  Recent Labs Lab 09/30/14 1820 10/01/14 0201  TROPONINI 0.08* 0.15*   BNP: Invalid input(s): POCBNP CBG:  Recent Labs Lab 10/01/14 0156 10/01/14 0331 10/01/14 0632 10/01/14 0806  GLUCAP 324* 319* 327* 256*    No results found for this or any previous visit (from the past 240 hour(s)).   Scheduled Meds: . enoxaparin (LOVENOX) injection  140 mg Subcutaneous Q12H  . insulin aspart  0-15 Units Subcutaneous TID WC  . insulin aspart  0-5 Units Subcutaneous QHS  . insulin glargine  10 Units Subcutaneous QHS  . sodium chloride  3 mL Intravenous Q12H   Continuous Infusions: . sodium chloride 100 mL/hr at 10/01/14 0500     Mckay Tegtmeyer, DO  [facility] Pager [phone]  If 7PM-7AM, please contact night-coverage [email or web address] Password [email or web address] 10/01/2014,  11:40 AM   LOS: 1 day

## 2014-10-01 NOTE — ED Notes (Signed)
Pt waiting for step down bed at [facility]

## 2014-10-02 DIAGNOSIS — R7989 Other specified abnormal findings of blood chemistry: Secondary | ICD-10-CM

## 2014-10-02 LAB — CBC WITH DIFFERENTIAL/PLATELET
BASOS ABS: 0 10*3/uL (ref 0.0–0.1)
Basophils Relative: 0 % (ref 0–1)
EOS ABS: 0.2 10*3/uL (ref 0.0–0.7)
Eosinophils Relative: 4 % (ref 0–5)
HCT: 39.6 % (ref 39.0–52.0)
HEMOGLOBIN: 13.6 g/dL (ref 13.0–17.0)
LYMPHS ABS: 3.1 10*3/uL (ref 0.7–4.0)
Lymphocytes Relative: 44 % (ref 12–46)
MCH: 27.7 pg (ref 26.0–34.0)
MCHC: 34.3 g/dL (ref 30.0–36.0)
MCV: 80.7 fL (ref 78.0–100.0)
MONOS PCT: 8 % (ref 3–12)
Monocytes Absolute: 0.6 10*3/uL (ref 0.1–1.0)
Neutro Abs: 3.1 10*3/uL (ref 1.7–7.7)
Neutrophils Relative %: 44 % (ref 43–77)
Platelets: 241 10*3/uL (ref 150–400)
RBC: 4.91 MIL/uL (ref 4.22–5.81)
RDW: 13 % (ref 11.5–15.5)
WBC: 7 10*3/uL (ref 4.0–10.5)

## 2014-10-02 LAB — COMPREHENSIVE METABOLIC PANEL
ALT: 23 U/L (ref 0–53)
AST: 24 U/L (ref 0–37)
Albumin: 3.1 g/dL — ABNORMAL LOW (ref 3.5–5.2)
Alkaline Phosphatase: 64 U/L (ref 39–117)
Anion gap: 7 (ref 5–15)
BUN: 8 mg/dL (ref 6–23)
CO2: 26 mmol/L (ref 19–32)
Calcium: 8.4 mg/dL (ref 8.4–10.5)
Chloride: 101 mmol/L (ref 96–112)
Creatinine, Ser: 0.86 mg/dL (ref 0.50–1.35)
GFR calc Af Amer: >90 mL/min (ref >90)
GFR calc non Af Amer: >90 mL/min (ref >90)
Glucose, Bld: 312 mg/dL — ABNORMAL HIGH (ref 70–99)
POTASSIUM: 4.3 mmol/L (ref 3.5–5.1)
Sodium: 134 mmol/L — ABNORMAL LOW (ref 135–145)
Total Bilirubin: 0.5 mg/dL (ref 0.3–1.2)
Total Protein: 6.3 g/dL (ref 6.0–8.3)

## 2014-10-02 LAB — GLUCOSE, CAPILLARY
Glucose-Capillary: 279 mg/dL — ABNORMAL HIGH (ref 70–99)
Glucose-Capillary: 298 mg/dL — ABNORMAL HIGH (ref 70–99)
Glucose-Capillary: 182 mg/dL — ABNORMAL HIGH (ref 70–99)
Glucose-Capillary: 266 mg/dL — ABNORMAL HIGH (ref 70–99)

## 2014-10-02 LAB — HEMOGLOBIN A1C
Hgb A1c MFr Bld: 12 % — ABNORMAL HIGH (ref 4.8–5.6)
Mean Plasma Glucose: 298 mg/dL

## 2014-10-02 MED ORDER — RIVAROXABAN 15 MG PO TABS
15.0000 mg | ORAL_TABLET | Freq: Two times a day (BID) | ORAL | Status: DC
  Administered 2014-10-02 – 2014-10-04 (×5): 15 mg via ORAL
  Filled 2014-10-02 (×7): qty 1

## 2014-10-02 MED ORDER — HYDROXYZINE HCL 25 MG PO TABS
25.0000 mg | ORAL_TABLET | Freq: Four times a day (QID) | ORAL | Status: DC | PRN
  Administered 2014-10-02: 25 mg via ORAL
  Filled 2014-10-02 (×2): qty 1

## 2014-10-02 NOTE — Plan of Care (Signed)
Problem: Food- and Nutrition-Related Knowledge Deficit (NB-1.1) Goal: Nutrition education Formal process to instruct or train a patient/client in a skill or to impart knowledge to help patients/clients voluntarily manage or modify food choices and eating behavior to maintain or improve health. Outcome: Completed/Met Date Met:  10/02/14 RD consulted for Diabetes Diet Education  Dietetic Intern provided "Carbohydrate Counting for Diabetes" handout from the Academy of Nutrition and Dietetics and "The Plate Method" Handout.  Discussed food groups and their effect on blood sugar levels.    Discussed what food are considered carbohydrates and reinforced knowledge on carbohydrate counting from education with Diabetes Coordinator.  Discussed the importance of eating 3 meals a day or every 3-4 hours for optimal blood sugar control.    Expect good compliance.    Body Mass Index (kg/m2) 44.2.  Pt meets criteria for Morbid Obesity based on BMI.   Currently on Heart Healthy/ CHO modified diet and consuming 100% of meals.  No further nutrition interventions warranted at this time.  If additional issues arise please re-consult RD.  Elmer Picker MS Dietetic Intern Pager Number [phone]

## 2014-10-02 NOTE — Progress Notes (Signed)
Inpatient Diabetes Program Recommendations  AACE/ADA: New Consensus Statement on Inpatient Glycemic Control (2013)  Target Ranges:  Prepandial:   less than 140 mg/dL      Peak postprandial:   less than 180 mg/dL (1-2 hours)      Critically ill patients:  140 - 180 mg/dL   Reason for Visit: Survival skills of diabetes discussed with patient and girlfriend.  Discussed diet, exercise, hypo, hyperglycemia, monitoring and standards of care.  Patient still complains of itching with injections.  MD is aware.  Discussed with pharmacy also.  May need a different type of insulin?  It is not clear if the Lantus also precipitates the itching, however patient states that the Novolog does seem to make the itching worse.  Told him to report symptoms to RN.   Army Chacoatherine Ayers, RN demonstrated use of insulin pen with patient.  Patient returned demonstration.  He states that diet, portion control, monitoring and insulin administration would be a challenge for him.  However he states that he is motivated to make changes. He states "I've done it before and can do it again". Discussed outpatient diabetes education.  Will make sure ordered per protocol.  Will continue to monitor.  Beryl MeagerJenny Tyra Michelle, RN, BC-ADM Inpatient Diabetes Coordinator Pager [phone]

## 2014-10-02 NOTE — Progress Notes (Signed)
Name: Marcus Joseph MRN: [id number] DOB: [birth year]    ADMISSION DATE:  09/30/2014 CONSULTATION DATE:  10/01/2014  REFERRING MD :  [facility] EDP  CHIEF COMPLAINT:  SOB  BRIEF PATIENT DESCRIPTION: 44 year old male with DM presented to [facility] ED 2/2 with SOB. CTA found saddle PE with evidence of right heart strain. Transferred to [facility] for stepdown admission. PCCM to consult.   SIGNIFICANT EVENTS   STUDIES:  CTA chest 2/2: Large volume bilateral acute pulmonary emboli with evidence of right heart strain and saddle embolus configuration. Duplex 2/3 >>Right: DVT noted in the posterior branch of the posterior tibial vein coursing through the popliteal and distal femoral veins. Also note was DVT of several proximal calf veins. Echo 2/3 >> mild D shaped septum s/o RV overload  HISTORY OF PRESENT ILLNESS:  44 y.o. male with PMH as below, which includes borderline DM. Nonsmoker. He presented to [facility] ED 2/2 complaining of SOB. 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 He presented about one week prior to today for  Cough/SOB, was treated for URI and discharged. He has had upper respiraory symptoms for 3 weeks prior to presentation 2/2. In ED he c/o SOB, chest pain, cough, and occasional post-tussive emesis. Nothing makes symptoms better or worse. He denies a history of PE or DVT. CTA of the chest was performed and found saddle PE with large clot burden. He is to be transferred to [facility] for admission under hospitalist team. PCCM to consult.   SUBJECTIVE: c/o itching Breathing ok  VITAL SIGNS: Temp:  [98.4 F (36.9 C)-98.9 F (37.2 C)] 98.9 F (37.2 C) (02/04 0438) Pulse Rate:  [91-97] 97 (02/04 0438) Resp:  [18] 18 (02/04 0438) BP: (116-120)/(76-88) 119/88 mmHg (02/04 0438) SpO2:  [98 %-100 %] 99 % (02/04 0438) Weight:  [143.8 kg (317 lb 0.3 oz)] 143.8 kg (317 lb 0.3 oz) (02/04 0438)  PHYSICAL EXAMINATION: General:  Morbidly obese male in NAD on 2L [place] Neuro:  Alert, oriented,  non-focal HEENT:  [place]/AT, PERRL, no JVD noted Cardiovascular:  RRR, no MRG Lungs:  Distant, clear bilateral breath sounds. WOB wnl Abdomen:  Obese, soft, non-tender, non-distended Musculoskeletal:  No acute deformity or ROM limitation Skin: Grossly intact.   Recent Labs Lab 09/30/14 1820 10/01/14 0524 10/02/14 0500  NA 132* 133* 134*  K 3.8 4.2 4.3  CL 99 100 101  CO2 BUN CREATININE 1.07 0.95 0.86  GLUCOSE 435* 362* 312*    Recent Labs Lab 09/30/14 1820 10/01/14 0524 10/02/14 0500  HGB 14.4 13.9 13.6  HCT 42.8 40.8 39.6  WBC 9.2 7.7 7.0  PLT 301 265 241   Dg Chest 2 View  09/30/2014   CLINICAL DATA:  Acute onset of cough, shortness of breath and difficulty breathing. Initial encounter.  EXAM: CHEST  2 VIEW  COMPARISON:  Chest radiograph 08/30/2015 2016  FINDINGS: The lungs are well-aerated. Mild vascular congestion and vascular crowding are seen. There is no evidence of focal opacification, pleural effusion or pneumothorax.  The heart is normal in size; the mediastinal contour is within normal limits. No acute osseous abnormalities are seen.  IMPRESSION: Mild vascular congestion noted.  Lungs remain grossly clear.   Electronically Signed   By: Roanna Raider M.D.   On: 09/30/2014 18:42   Ct Angio Chest Pe W/cm &/or Wo Cm  09/30/2014   CLINICAL DATA:  Shortness of breath, difficulty breathing. Elevated D-dimer.  EXAM: CT ANGIOGRAPHY  CHEST WITH CONTRAST  TECHNIQUE: Multidetector CT imaging of the chest was performed using the standard protocol during bolus administration of intravenous contrast. Multiplanar CT image reconstructions and MIPs were obtained to evaluate the vascular anatomy.  CONTRAST:  100mL OMNIPAQUE IOHEXOL 350 MG/ML SOLN  COMPARISON:  Chest radiograph same day  FINDINGS: There is large volume acute pulmonary embolism extending to all main pulmonary arterial segments, with central saddle type configuration. Evidence of right heart strain is  identified, with RV:LV ratio of 1.2. Detail is minimally obscured by early bolus timing and likely pressure effect due to right heart strain but this is not felt to significantly affect the diagnostic quality or findings on the current exam. Heart size is normal.  Aorta is normal in caliber. No lymphadenopathy. No pericardial or pleural effusion.  The lungs are clear.  Central airways are patent.  No acute osseous abnormality.  Review of the MIP images confirms the above findings.  IMPRESSION: Large volume bilateral acute pulmonary emboli with evidence of right heart strain and saddle embolus configuration. Critical Value/emergent results were called by telephone at the time of interpretation on 09/30/2014 at 8:16 pm to Dr. Tilden FossaELIZABETH REES , who verbally acknowledged these results.   Electronically Signed   By: Christiana PellantGretchen  Green M.D.   On: 09/30/2014 20:16    ASSESSMENT / PLAN:  Pulmonary Embolism, saddle  - suspect provoked in setting of recent knee surgery - evidence of R heart stain on CT & echo, RV/LV ratio 1.2 Elevated troponin - Supplemental O2 to maintain SpO2 greater than 92% - Continue therapeutic dose Lovenox per pharmacy  -Can start Novel anticoagulant . He will need further discussion about Coumadin versus newer agents. Suggest duration of anticoagulation 6 months, since he had a clear risk factor Would observe for 48h in hospital given clot burden   DM - Per primary Unclear cause of itching ? Insulin - consider orals -defer to [facility]    Oretha MilchALVA,RAKESH V. MD

## 2014-10-02 NOTE — Progress Notes (Signed)
PROGRESS NOTE  Erlene QuanWilliam L Alemu ZOX:[id number] DOB: June 06, 1971 DOA: 09/30/2014 PCP: No PCP Per Patient  Assessment/Plan: Pulmonary Embolus -d/c Lovenox [place] and start Xarelto -will likely go home with Eliquis or Xarelto -await Echo--EF 55-60 percent, grade 1 diastolic dysfunction, mild reduced RV function -appreciate CCM consult and followup -wean oxygen for sat >92% DVT-RLE -duplex with DVT of R-popliteal, distal femoral and post tibial veins Diabetes Mellitus, uncontrolled -Start Lantus 20 units at bedtime -NovoLog sliding scale -Await hemoglobin A1c--12.0 -Diabetic education Elevated troponin -Secondary to pulmonary embolus  Morbid obesity -BMI 44.2 -nutrition eval Pruritis -no rash -symptomatic tx -no signs of angioedema or anaphylaxis  Family Communication:   Wife updated at beside Disposition Plan:   Home  2/5 if stable      Procedures/Studies: Dg Chest 2 View  09/30/2014   CLINICAL DATA:  Acute onset of cough, shortness of breath and difficulty breathing. Initial encounter.  EXAM: CHEST  2 VIEW  COMPARISON:  Chest radiograph 08/30/2015 2016  FINDINGS: The lungs are well-aerated. Mild vascular congestion and vascular crowding are seen. There is no evidence of focal opacification, pleural effusion or pneumothorax.  The heart is normal in size; the mediastinal contour is within normal limits. No acute osseous abnormalities are seen.  IMPRESSION: Mild vascular congestion noted.  Lungs remain grossly clear.   Electronically Signed   By: Roanna RaiderJeffery  Chang M.D.   On: 09/30/2014 18:42   Dg Chest 2 View  09/14/2014   CLINICAL DATA:  Initial encounter for cough for 2 weeks. Low grade fever.  EXAM: CHEST  2 VIEW  COMPARISON:  11/09/2009  FINDINGS: Midline trachea.  Normal heart size and mediastinal contours.  Sharp costophrenic angles.  No pneumothorax.  Clear lungs.  IMPRESSION: No active cardiopulmonary disease.   Electronically Signed   By: Jeronimo GreavesKyle  Talbot M.D.   On:  09/14/2014 09:41   Ct Angio Chest Pe W/cm &/or Wo Cm  09/30/2014   CLINICAL DATA:  Shortness of breath, difficulty breathing. Elevated D-dimer.  EXAM: CT ANGIOGRAPHY CHEST WITH CONTRAST  TECHNIQUE: Multidetector CT imaging of the chest was performed using the standard protocol during bolus administration of intravenous contrast. Multiplanar CT image reconstructions and MIPs were obtained to evaluate the vascular anatomy.  CONTRAST:  100mL OMNIPAQUE IOHEXOL 350 MG/ML SOLN  COMPARISON:  Chest radiograph same day  FINDINGS: There is large volume acute pulmonary embolism extending to all main pulmonary arterial segments, with central saddle type configuration. Evidence of right heart strain is identified, with RV:LV ratio of 1.2. Detail is minimally obscured by early bolus timing and likely pressure effect due to right heart strain but this is not felt to significantly affect the diagnostic quality or findings on the current exam. Heart size is normal.  Aorta is normal in caliber. No lymphadenopathy. No pericardial or pleural effusion.  The lungs are clear.  Central airways are patent.  No acute osseous abnormality.  Review of the MIP images confirms the above findings.  IMPRESSION: Large volume bilateral acute pulmonary emboli with evidence of right heart strain and saddle embolus configuration. Critical Value/emergent results were called by telephone at the time of interpretation on 09/30/2014 at 8:16 pm to Dr. Tilden FossaELIZABETH REES , who verbally acknowledged these results.   Electronically Signed   By: Christiana PellantGretchen  Green M.D.   On: 09/30/2014 20:16         Subjective: Patient denies fevers, chills, headache, chest pain, dyspnea, nausea, vomiting, diarrhea, abdominal pain, dysuria,  hematuria He complains of itching in his arms or legs. No visible rash. No tongue swelling or shortness of breath. No facial edema.  Objective: Filed Vitals:   10/01/14 0326 10/01/14 1300 10/01/14 2025 10/02/14 0438  BP: 117/67 120/76  116/88 119/88  Pulse: 95 91 91 97  Temp: 98.1 F (36.7 C) 98.7 F (37.1 C) 98.4 F (36.9 C) 98.9 F (37.2 C)  TempSrc: Oral Oral Oral Oral  Resp: Height:      Weight: 140.6 kg (309 lb 15.5 oz)   143.8 kg (317 lb 0.3 oz)  SpO2: 99% 98% 100% 99%    Intake/Output Summary (Last 24 hours) at 10/02/14 1224 Last data filed at 10/02/14 0800  Gross per 24 hour  Intake   2622 ml  Output    400 ml  Net   2222 ml   Weight change: -3.619 kg (-7 lb 15.7 oz) Exam:   General:  Pt is alert, follows commands appropriately, not in acute distress  HEENT: No icterus, No thrush,  [place]/AT  Cardiovascular: RRR, S1/S2, no rubs, no gallops  Respiratory: CTA bilaterally, no wheezing, no crackles, no rhonchi  Abdomen: Soft/+BS, non tender, non distended, no guarding  Extremities: trace LE edema, No lymphangitis, No petechiae, No rashes, no synovitis  Data Reviewed: Basic Metabolic Panel:  Recent Labs Lab 09/30/14 1820 10/01/14 0524 10/02/14 0500  NA 132* 133* 134*  K 3.8 4.2 4.3  CL 99 100 101  CO2 GLUCOSE 435* 362* 312*  BUN CREATININE 1.07 0.95 0.86  CALCIUM 8.7 8.5 8.4   Liver Function Tests:  Recent Labs Lab 10/01/14 0524 10/02/14 0500  AST 21 24  ALT 28 23  ALKPHOS 64 64  BILITOT 0.3 0.5  PROT 6.8 6.3  ALBUMIN 3.3* 3.1*   No results for input(s): LIPASE, AMYLASE in the last 168 hours. No results for input(s): AMMONIA in the last 168 hours. CBC:  Recent Labs Lab 09/30/14 1820 10/01/14 0524 10/02/14 0500  WBC 9.2 7.7 7.0  NEUTROABS 4.5 3.1 3.1  HGB 14.4 13.9 13.6  HCT 42.8 40.8 39.6  MCV 81.5 80.8 80.7  PLT 301 265 241   Cardiac Enzymes:  Recent Labs Lab 09/30/14 1820 10/01/14 0201  TROPONINI 0.08* 0.15*   BNP: Invalid input(s): POCBNP CBG:  Recent Labs Lab 10/01/14 1129 10/01/14 1634 10/01/14 2027 10/02/14 0619 10/02/14 1109  GLUCAP 273* 247* 221* 279* 298*    No results found for this or any previous visit  (from the past 240 hour(s)).   Scheduled Meds: . insulin aspart  0-15 Units Subcutaneous TID WC  . insulin aspart  0-5 Units Subcutaneous QHS  . insulin glargine  20 Units Subcutaneous QHS  . Rivaroxaban  15 mg Oral BID WC  . sodium chloride  3 mL Intravenous Q12H   Continuous Infusions:    Nishaan Stanke, DO  [facility] Pager [phone]  If 7PM-7AM, please contact night-coverage [email or web address] Password [email or web address] 10/02/2014, 12:24 PM   LOS: 2 days

## 2014-10-02 NOTE — Progress Notes (Signed)
Pt ambulated throughout halls on room air. O2 sats 95-100%, HR 100-111. Denies DOE, SOB, dizziness, or any pain/discomfort.

## 2014-10-02 NOTE — Discharge Instructions (Signed)
Information on my medicine - XARELTO (rivaroxaban)  This medication education was reviewed with me or my healthcare representative as part of my discharge preparation.    WHY WAS XARELTO PRESCRIBED FOR YOU? Xarelto was prescribed to treat blood clots that may have been found in the veins of your legs (deep vein thrombosis) or in your lungs (pulmonary embolism) and to reduce the risk of them occurring again.  What do you need to know about Xarelto? The starting dose is one 15 mg tablet taken TWICE daily with food for the FIRST 21 DAYS then on 10/24/2014 the dose is changed to one 20 mg tablet taken ONCE A DAY with your evening meal.  DO NOT stop taking Xarelto without talking to the health care provider who prescribed the medication.  Refill your prescription for 20 mg tablets before you run out.  After discharge, you should have regular check-up appointments with your healthcare provider that is prescribing your Xarelto.  In the future your dose may need to be changed if your kidney function changes by a significant amount.  What do you do if you miss a dose? If you are taking Xarelto TWICE DAILY and you miss a dose, take it as soon as you remember. You may take two 15 mg tablets (total 30 mg) at the same time then resume your regularly scheduled 15 mg twice daily the next day.  If you are taking Xarelto ONCE DAILY and you miss a dose, take it as soon as you remember on the same day then continue your regularly scheduled once daily regimen the next day. Do not take two doses of Xarelto at the same time.   Important Safety Information Xarelto is a blood thinner medicine that can cause bleeding. You should call your healthcare provider right away if you experience any of the following: ? Bleeding from an injury or your nose that does not stop. ? Unusual colored urine (red or dark brown) or unusual colored stools (red or black). ? Unusual bruising for unknown reasons. ? A serious fall  or if you hit your head (even if there is no bleeding).  Some medicines may interact with Xarelto and might increase your risk of bleeding while on Xarelto. To help avoid this, consult your healthcare provider or pharmacist prior to using any new prescription or non-prescription medications, including herbals, vitamins, non-steroidal anti-inflammatory drugs (NSAIDs) and supplements.  This website has more information on Xarelto: [email or web address].

## 2014-10-03 ENCOUNTER — Inpatient Hospital Stay (HOSPITAL_COMMUNITY): Payer: BLUE CROSS/BLUE SHIELD

## 2014-10-03 LAB — GLUCOSE, CAPILLARY
GLUCOSE-CAPILLARY: 226 mg/dL — AB (ref 70–99)
Glucose-Capillary: 246 mg/dL — ABNORMAL HIGH (ref 70–99)
Glucose-Capillary: 234 mg/dL — ABNORMAL HIGH (ref 70–99)
Glucose-Capillary: 243 mg/dL — ABNORMAL HIGH (ref 70–99)

## 2014-10-03 LAB — COMPREHENSIVE METABOLIC PANEL
ALT: 25 U/L (ref 0–53)
AST: 23 U/L (ref 0–37)
Albumin: 2.8 g/dL — ABNORMAL LOW (ref 3.5–5.2)
Alkaline Phosphatase: 56 U/L (ref 39–117)
Anion gap: 6 (ref 5–15)
BILIRUBIN TOTAL: 0.4 mg/dL (ref 0.3–1.2)
BUN: 5 mg/dL — ABNORMAL LOW (ref 6–23)
CO2: 28 mmol/L (ref 19–32)
Calcium: 8.6 mg/dL (ref 8.4–10.5)
Chloride: 102 mmol/L (ref 96–112)
Creatinine, Ser: 1.02 mg/dL (ref 0.50–1.35)
GFR calc Af Amer: >90 mL/min (ref >90)
GFR calc non Af Amer: 89 mL/min — ABNORMAL LOW (ref >90)
Glucose, Bld: 295 mg/dL — ABNORMAL HIGH (ref 70–99)
Potassium: 3.9 mmol/L (ref 3.5–5.1)
Sodium: 136 mmol/L (ref 135–145)
TOTAL PROTEIN: 6 g/dL (ref 6.0–8.3)

## 2014-10-03 LAB — CBC WITH DIFFERENTIAL/PLATELET
Basophils Absolute: 0 10*3/uL (ref 0.0–0.1)
Basophils Relative: 0 % (ref 0–1)
EOS PCT: 5 % (ref 0–5)
Eosinophils Absolute: 0.3 10*3/uL (ref 0.0–0.7)
HCT: 39.8 % (ref 39.0–52.0)
HEMOGLOBIN: 13.3 g/dL (ref 13.0–17.0)
LYMPHS ABS: 2.6 10*3/uL (ref 0.7–4.0)
Lymphocytes Relative: 40 % (ref 12–46)
MCH: 27.4 pg (ref 26.0–34.0)
MCHC: 33.4 g/dL (ref 30.0–36.0)
MCV: 81.9 fL (ref 78.0–100.0)
Monocytes Absolute: 0.6 10*3/uL (ref 0.1–1.0)
Monocytes Relative: 9 % (ref 3–12)
Neutro Abs: 2.9 10*3/uL (ref 1.7–7.7)
Neutrophils Relative %: 46 % (ref 43–77)
PLATELETS: 240 10*3/uL (ref 150–400)
RBC: 4.86 MIL/uL (ref 4.22–5.81)
RDW: 13.1 % (ref 11.5–15.5)
WBC: 6.4 10*3/uL (ref 4.0–10.5)

## 2014-10-03 LAB — TROPONIN I: Troponin I: 0.06 ng/mL — ABNORMAL HIGH (ref <0.031)

## 2014-10-03 MED ORDER — INSULIN ASPART 100 UNIT/ML ~~LOC~~ SOLN
3.0000 [IU] | Freq: Three times a day (TID) | SUBCUTANEOUS | Status: DC
  Administered 2014-10-03 – 2014-10-04 (×4): 3 [IU] via SUBCUTANEOUS

## 2014-10-03 MED ORDER — ACETAMINOPHEN 325 MG PO TABS
650.0000 mg | ORAL_TABLET | Freq: Four times a day (QID) | ORAL | Status: DC | PRN
  Administered 2014-10-03 – 2014-10-04 (×2): 650 mg via ORAL
  Filled 2014-10-03: qty 2

## 2014-10-03 MED ORDER — INSULIN GLARGINE 100 UNIT/ML ~~LOC~~ SOLN
30.0000 [IU] | Freq: Every day | SUBCUTANEOUS | Status: DC
  Administered 2014-10-03: 30 [IU] via SUBCUTANEOUS
  Filled 2014-10-03 (×2): qty 0.3

## 2014-10-03 NOTE — Progress Notes (Signed)
PROGRESS NOTE  Marcus Joseph ZOX:[id number] DOB: 1971/01/05 DOA: 09/30/2014 PCP: No PCP Per Patient  Assessment/Plan: Pulmonary Embolus -d/c Lovenox [place] and start Xarelto -will likely go home with Eliquis or Xarelto -await Echo--EF 55-60 percent, grade 1 diastolic dysfunction, mild reduced RV function -appreciate CCM consult and followup -wean oxygen for sat >92% - 10/03/2014-patient is clinically stable on room air with oxygen saturation 98-100 percent - 10/03/2014-- ambulation did not reveal any oxygen desaturation-- remained on 8-100 percent on room air DVT-RLE -duplex with DVT of R-popliteal, distal femoral and post tibial veins Diabetes Mellitus, uncontrolled -Increase Lantus 30 units at bedtime -NovoLog sliding scale -Await hemoglobin A1c--12.0 -Diabetic education - NovoLog 3 units with meals  hematuria - nursing has not noted any hematuria - check UA Elevated troponin -Secondary to pulmonary embolus  Morbid obesity -BMI 44.2 -nutrition eval Pruritis -no rash -symptomatic tx - improved -no signs of angioedema or anaphylaxis  Family Communication: Wife updated at beside Disposition Plan: Home 2/5 if stable         Procedures/Studies: Dg Chest 2 View  09/30/2014   CLINICAL DATA:  Acute onset of cough, shortness of breath and difficulty breathing. Initial encounter.  EXAM: CHEST  2 VIEW  COMPARISON:  Chest radiograph 08/30/2015 2016  FINDINGS: The lungs are well-aerated. Mild vascular congestion and vascular crowding are seen. There is no evidence of focal opacification, pleural effusion or pneumothorax.  The heart is normal in size; the mediastinal contour is within normal limits. No acute osseous abnormalities are seen.  IMPRESSION: Mild vascular congestion noted.  Lungs remain grossly clear.   Electronically Signed   By: Roanna RaiderJeffery  Chang M.D.   On: 09/30/2014 18:42   Dg Chest 2 View  09/14/2014   CLINICAL DATA:  Initial encounter for cough for 2  weeks. Low grade fever.  EXAM: CHEST  2 VIEW  COMPARISON:  11/09/2009  FINDINGS: Midline trachea.  Normal heart size and mediastinal contours.  Sharp costophrenic angles.  No pneumothorax.  Clear lungs.  IMPRESSION: No active cardiopulmonary disease.   Electronically Signed   By: Jeronimo GreavesKyle  Talbot M.D.   On: 09/14/2014 09:41   Ct Angio Chest Pe W/cm &/or Wo Cm  09/30/2014   CLINICAL DATA:  Shortness of breath, difficulty breathing. Elevated D-dimer.  EXAM: CT ANGIOGRAPHY CHEST WITH CONTRAST  TECHNIQUE: Multidetector CT imaging of the chest was performed using the standard protocol during bolus administration of intravenous contrast. Multiplanar CT image reconstructions and MIPs were obtained to evaluate the vascular anatomy.  CONTRAST:  100mL OMNIPAQUE IOHEXOL 350 MG/ML SOLN  COMPARISON:  Chest radiograph same day  FINDINGS: There is large volume acute pulmonary embolism extending to all main pulmonary arterial segments, with central saddle type configuration. Evidence of right heart strain is identified, with RV:LV ratio of 1.2. Detail is minimally obscured by early bolus timing and likely pressure effect due to right heart strain but this is not felt to significantly affect the diagnostic quality or findings on the current exam. Heart size is normal.  Aorta is normal in caliber. No lymphadenopathy. No pericardial or pleural effusion.  The lungs are clear.  Central airways are patent.  No acute osseous abnormality.  Review of the MIP images confirms the above findings.  IMPRESSION: Large volume bilateral acute pulmonary emboli with evidence of right heart strain and saddle embolus configuration. Critical Value/emergent results were called by telephone at the time of interpretation on 09/30/2014 at 8:16 pm to Dr.  ELIZABETH REES , who verbally acknowledged these results.   Electronically Signed   By: Christiana Pellant M.D.   On: 09/30/2014 20:16   Dg Chest 2v Repeat Same Day  10/03/2014   CLINICAL DATA:  Dyspnea.  EXAM:  CHEST - 2 VIEW SAME DAY  COMPARISON:  September 30, 2014.  FINDINGS: Cardiomediastinal silhouette appears normal. No pneumothorax or pleural effusion is noted. Stable mild central pulmonary vascular congestion is noted. No consolidative process is noted. Bony thorax is intact.  IMPRESSION: Stable mild central pulmonary vascular congestion. No significant change compared to prior exam.   Electronically Signed   By: Roque Lias M.D.   On: 10/03/2014 13:32         Subjective:  patient complains of some dyspnea with exertion today with intermittent chest discomfort. Denies any fevers, chills, nausea, vomiting, diarrhea. No hematochezia. He ate 100% of all his meals. No abdominal pain. He had noted some hematuria but has not notified the nursing staff.  Objective: Filed Vitals:   10/02/14 1327 10/02/14 2058 10/03/14 0459 10/03/14 1401  BP: 129/77 129/82 121/68 124/81  Pulse: 94 99 96 82  Temp: 98.1 F (36.7 C) 99.6 F (37.6 C) 98.5 F (36.9 C) 98.5 F (36.9 C)  TempSrc: Oral Oral Oral Oral  Resp: Height:      Weight:   144.6 kg (318 lb 12.6 oz)   SpO2: 100% 100% 99% 100%    Intake/Output Summary (Last 24 hours) at 10/03/14 1931 Last data filed at 10/03/14 1822  Gross per 24 hour  Intake    702 ml  Output      0 ml  Net    702 ml   Weight change: 0.8 kg (1 lb 12.2 oz) Exam:   General:  Pt is alert, follows commands appropriately, not in acute distress  HEENT: No icterus, No thrush, [place]/AT  Cardiovascular: RRR, S1/S2, no rubs, no gallops  Respiratory: CTA bilaterally, no wheezing, no crackles, no rhonchi  Abdomen: Soft/+BS, non tender, non distended, no guarding  Extremities: trace LE edema, No lymphangitis, No petechiae, No rashes, no synovitis  Data Reviewed: Basic Metabolic Panel:  Recent Labs Lab 09/30/14 1820 10/01/14 0524 10/02/14 0500 10/03/14 0550  NA 132* 133* 134* 136  K 3.8 4.2 4.3 3.9  CL 99 100 101 102  CO2 GLUCOSE 435*  362* 312* 295*  BUN 5*  CREATININE 1.07 0.95 0.86 1.02  CALCIUM 8.7 8.5 8.4 8.6   Liver Function Tests:  Recent Labs Lab 10/01/14 0524 10/02/14 0500 10/03/14 0550  AST ALT ALKPHOS 64 64 56  BILITOT 0.3 0.5 0.4  PROT 6.8 6.3 6.0  ALBUMIN 3.3* 3.1* 2.8*   No results for input(s): LIPASE, AMYLASE in the last 168 hours. No results for input(s): AMMONIA in the last 168 hours. CBC:  Recent Labs Lab 09/30/14 1820 10/01/14 0524 10/02/14 0500 10/03/14 0550  WBC 9.2 7.7 7.0 6.4  NEUTROABS 4.5 3.1 3.1 2.9  HGB 14.4 13.9 13.6 13.3  HCT 42.8 40.8 39.6 39.8  MCV 81.5 80.8 80.7 81.9  PLT 301 265 241 240   Cardiac Enzymes:  Recent Labs Lab 09/30/14 1820 10/01/14 0201 10/03/14 0550  TROPONINI 0.08* 0.15* 0.06*   BNP: Invalid input(s): POCBNP CBG:  Recent Labs Lab 10/02/14 1605 10/02/14 2107 10/03/14 0603 10/03/14 1127 10/03/14 1618  GLUCAP 182* 266* 246* 234* 226*    No  results found for this or any previous visit (from the past 240 hour(s)).   Scheduled Meds: . insulin aspart  0-15 Units Subcutaneous TID WC  . insulin aspart  0-5 Units Subcutaneous QHS  . insulin aspart  3 Units Subcutaneous TID WC  . insulin glargine  30 Units Subcutaneous QHS  . Rivaroxaban  15 mg Oral BID WC  . sodium chloride  3 mL Intravenous Q12H   Continuous Infusions:    Hence Derrick, DO  [facility] Pager [phone]  If 7PM-7AM, please contact night-coverage [email or web address] Password [email or web address] 10/03/2014, 7:31 PM   LOS: 3 days

## 2014-10-03 NOTE — Care Management Note (Signed)
    Page 1 of 1   10/03/2014     2:43:55 PM CARE MANAGEMENT NOTE 10/03/2014  Patient:  Marcus Joseph,Marcus Joseph   Account Number:  [id number]  Date Initiated:  10/03/2014  Documentation initiated by:  Donn PieriniWEBSTER,Trebor Galdamez  Subjective/Objective Assessment:   Pt admitted for PE/DVT     Action/Plan:   PTA pt lived at home- independent   Anticipated DC Date:  10/04/2014   Anticipated DC Plan:  HOME/SELF CARE      DC Planning Services  CM consult  Medication Assistance      Choice offered to / List presented to:             Status of service:  Completed, signed off Medicare Important Message given?   (If response is "NO", the following Medicare IM given date fields will be blank) Date Medicare IM given:   Medicare IM given by:   Date Additional Medicare IM given:   Additional Medicare IM given by:    Discharge Disposition:  HOME/SELF CARE  Per UR Regulation:  Reviewed for med. necessity/level of care/duration of stay  If discussed at Long Length of Stay Meetings, dates discussed:    Comments:  10/03/14- 1400- Donn PieriniKristi Torryn Fiske RN, BSN [phone] Referral for Xarelto- per rep at [employer]:  xarelto: auth required [phone]/ unable to provide co-pay structure without prior auth  spoke with pt at bedside- given above info- also gave 30 day free card along with copay assist card for Xarelto. No further needs identified.

## 2014-10-03 NOTE — Progress Notes (Signed)
   Name: Erlene QuanWilliam L Wuertz MRN: [id number] DOB: 1970-12-16    ADMISSION DATE:  09/30/2014 CONSULTATION DATE:  10/01/2014  [facility]  [facility]  CHIEF COMPLAINT:  SOB  BRIEF PATIENT DESCRIPTION: 44 year old male with DM presented [facility] 2/2 with SOB. CTA found saddle PE with evidence of right heart strain. [facility]. PCCM to consult.   SIGNIFICANT EVENTS   STUDIES:  CTA chest 2/2: Large volume bilateral acute pulmonary emboli with evidence of right heart strain and saddle embolus configuration. Duplex 2/3 >>Right: DVT noted in the posterior branch of the posterior tibial vein coursing through the popliteal and distal femoral veins. Also note was DVT of several proximal calf veins. Echo 2/3 >> mild D shaped septum s/o RV overload  HISTORY OF PRESENT ILLNESS:  44 y.o. male with PMH as below, which includes borderline DM. Nonsmoker. He [facility] 2[facility] complaining of SOB. 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 He presented about one week prior to today for  Cough/SOB, was treated for URI and discharged. He has had upper respiraory symptoms for 3 weeks prior to presentation 2/2. In ED he c/o SOB, chest pain, cough, and occasional post-tussive emesis. Nothing makes symptoms better or worse. He denies a history of PE or DVT. CTA of the chest was performed and found saddle PE with large clot burden. [facility] under hospitalist team. PCCM to consult.   SUBJECTIVE: c/o itching- improved C/o 1 episode of hematuria  Breathing slight worse -back on o2  VITAL SIGNS: Temp:  [98.1 F (36.7 C)-99.6 F (37.6 C)] 98.5 F (36.9 C) (02/05 0459) Pulse Rate:  [94-99] 96 (02/05 0459) Resp:  [18] 18 (02/05 0459) BP: (121-129)/(68-82) 121/68 mmHg (02/05 0459) SpO2:  [99 %-100 %] 99 % (02/05 0459) Weight:  [144.6 kg (318 lb 12.6 oz)] 144.6 kg (318 lb 12.6 oz) (02/05 0459)  PHYSICAL EXAMINATION: General:  Morbidly  obese male in NAD on 2L [place] Neuro:  Alert, oriented, non-focal HEENT:  [place]/AT, PERRL, no JVD noted Cardiovascular:  RRR, no MRG Lungs:  Distant, clear bilateral breath sounds. WOB wnl Abdomen:  Obese, soft, non-tender, non-distended Musculoskeletal:  No acute deformity or ROM limitation Skin: Grossly intact.   Recent Labs Lab 10/01/14 0524 10/02/14 0500 10/03/14 0550  NA 133* 134* 136  K 4.2 4.3 3.9  CL 100 101 102  CO2 26 26 28   BUN 9 8 5*  CREATININE 0.95 0.86 1.02  GLUCOSE 362* 312* 295*    Recent Labs Lab 10/01/14 0524 10/02/14 0500 10/03/14 0550  HGB 13.9 13.6 13.3  HCT 40.8 39.6 39.8  WBC 7.7 7.0 6.4  PLT 265 241 240   No results found.  ASSESSMENT / PLAN:  Pulmonary Embolism, saddle  - suspect provoked in setting of recent knee surgery - evidence of R heart stain on CT & echo, RV/LV ratio 1.2 Elevated troponin - Supplemental O2 to maintain SpO2 greater than 92% -  started rivaroxaban Suggest duration of anticoagulation 6 months, since he had a clear risk factor Would observe for another 24 in hospital given  New symptoms Needs  Sleep study as outpt. Answered questions about sexual activity after PE  DM - Per primary Unclear cause of itching , resolved now -doubt Insulin - consider orals -defer to [facility]  PCCM to sign off  Oretha MilchALVA,Davonta Stroot V. MD

## 2014-10-04 DIAGNOSIS — R319 Hematuria, unspecified: Secondary | ICD-10-CM | POA: Insufficient documentation

## 2014-10-04 LAB — URINALYSIS, ROUTINE W REFLEX MICROSCOPIC
BILIRUBIN URINE: NEGATIVE
Ketones, ur: NEGATIVE mg/dL
LEUKOCYTES UA: NEGATIVE
Nitrite: NEGATIVE
PH: 6 (ref 5.0–8.0)
Protein, ur: NEGATIVE mg/dL
SPECIFIC GRAVITY, URINE: 1.023 (ref 1.005–1.030)
Urobilinogen, UA: 0.2 mg/dL (ref 0.0–1.0)

## 2014-10-04 LAB — COMPREHENSIVE METABOLIC PANEL
ALBUMIN: 2.9 g/dL — AB (ref 3.5–5.2)
ALK PHOS: 57 U/L (ref 39–117)
ALT: 38 U/L (ref 0–53)
AST: 34 U/L (ref 0–37)
Anion gap: 7 (ref 5–15)
BUN: 6 mg/dL (ref 6–23)
CO2: 28 mmol/L (ref 19–32)
Calcium: 8.6 mg/dL (ref 8.4–10.5)
Chloride: 99 mmol/L (ref 96–112)
Creatinine, Ser: 0.91 mg/dL (ref 0.50–1.35)
GFR calc non Af Amer: >90 mL/min (ref >90)
GLUCOSE: 286 mg/dL — AB (ref 70–99)
POTASSIUM: 3.9 mmol/L (ref 3.5–5.1)
SODIUM: 134 mmol/L — AB (ref 135–145)
Total Bilirubin: 0.6 mg/dL (ref 0.3–1.2)
Total Protein: 6.8 g/dL (ref 6.0–8.3)

## 2014-10-04 LAB — CBC WITH DIFFERENTIAL/PLATELET
BASOS ABS: 0 10*3/uL (ref 0.0–0.1)
Basophils Relative: 0 % (ref 0–1)
EOS PCT: 5 % (ref 0–5)
Eosinophils Absolute: 0.4 10*3/uL (ref 0.0–0.7)
HCT: 38.6 % — ABNORMAL LOW (ref 39.0–52.0)
Hemoglobin: 12.9 g/dL — ABNORMAL LOW (ref 13.0–17.0)
LYMPHS ABS: 2.8 10*3/uL (ref 0.7–4.0)
LYMPHS PCT: 41 % (ref 12–46)
MCH: 26.7 pg (ref 26.0–34.0)
MCHC: 33.4 g/dL (ref 30.0–36.0)
MCV: 79.9 fL (ref 78.0–100.0)
Monocytes Absolute: 0.7 10*3/uL (ref 0.1–1.0)
Monocytes Relative: 10 % (ref 3–12)
NEUTROS ABS: 3 10*3/uL (ref 1.7–7.7)
NEUTROS PCT: 44 % (ref 43–77)
Platelets: 243 10*3/uL (ref 150–400)
RBC: 4.83 MIL/uL (ref 4.22–5.81)
RDW: 13.1 % (ref 11.5–15.5)
WBC: 6.8 10*3/uL (ref 4.0–10.5)

## 2014-10-04 LAB — URINE MICROSCOPIC-ADD ON

## 2014-10-04 LAB — GLUCOSE, CAPILLARY
Glucose-Capillary: 251 mg/dL — ABNORMAL HIGH (ref 70–99)
Glucose-Capillary: 255 mg/dL — ABNORMAL HIGH (ref 70–99)

## 2014-10-04 MED ORDER — BLOOD GLUCOSE MONITOR KIT: PACK | Status: DC

## 2014-10-04 MED ORDER — RIVAROXABAN 15 MG PO TABS: 15.0000 mg | ORAL_TABLET | Freq: Two times a day (BID) | ORAL | Status: DC

## 2014-10-04 MED ORDER — INSULIN PEN NEEDLE 32G X 4 MM MISC: Status: DC

## 2014-10-04 MED ORDER — INSULIN ASPART 100 UNIT/ML FLEXPEN: 5.0000 [IU] | PEN_INJECTOR | Freq: Three times a day (TID) | SUBCUTANEOUS | Status: DC

## 2014-10-04 MED ORDER — RIVAROXABAN 20 MG PO TABS: 20.0000 mg | ORAL_TABLET | Freq: Every day | ORAL | Status: DC

## 2014-10-04 MED ORDER — INSULIN GLARGINE 100 UNIT/ML SOLOSTAR PEN: 30.0000 [IU] | PEN_INJECTOR | Freq: Every day | SUBCUTANEOUS | Status: DC

## 2014-10-04 NOTE — Progress Notes (Signed)
Pt discharged per MD order and protocol. Discharge instructions reviewed with pt. All questions answered. Pt aware of all follow up appts. And aware prescriptions called into pharmacy.
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
## 2014-10-04 NOTE — Discharge Summary (Signed)
Physician Discharge Summary  Marcus Joseph DDU:[id number] DOB: 1970/10/20 DOA: 09/30/2014  PCP: No PCP Per Patient  Admit date: 09/30/2014 Discharge date: 10/04/2014  Recommendations for Outpatient Follow-up:  1. Pt will need to follow up with PCP in 2 weeks post discharge 2. Please obtain BMP 3. Please also check CBC    Discharge Diagnoses:  Pulmonary Embolus -d/c Lovenox [place] and start Xarelto -will likely go home with Eliquis or Xarelto -await Echo--EF 55-60 percent, grade 1 diastolic dysfunction, mild reduced RV function -appreciate CCM consult and followup -wean oxygen for sat >92% - 10/03/2014-patient is clinically stable on room air with oxygen saturation 98-100 percent - 10/03/2014-- ambulation did not reveal any oxygen desaturation-- remained on 98-100 percent on room air DVT-RLE -duplex with DVT of R-popliteal, distal femoral and post tibial veins Diabetes Mellitus, uncontrolled -Newly diagnosed during this admission -Increase Lantus 30 units at bedtime -NovoLog sliding scale -Await hemoglobin A1c--12.0 -Diabetic education provided during the admission by diabetic educators - NovoLog 5 units with meals hematuria -pt had intermitten hematuria during the hospitalization -review of medical records reveals he has had hematuria back to 2012 - check UA-showed microscopic hematuria -Consulted Dr. Rogelia Rohrer pt--did not feel pt needed any urgent intervention, but will need out CT abd/pelvis and cystoscopy  -Hgb remained stable Elevated troponin -Secondary to pulmonary embolus  -no chest pain after first day of admission Morbid obesity -BMI 44.2 -nutrition eval Pruritis -no rash -symptomatic tx - improved -no signs of angioedema or anaphylaxis  Discharge Condition: stable  Disposition:  home Follow-up Information    Follow up with PARRETT,TAMMY, NP On 10/16/2014.   Specialty:  Nurse Practitioner   Why:  11-15a   Contact information:   [address] [place] [address] [phone]       Diet:carbohydrate modified Wt Readings from Last 3 Encounters:  10/04/14 142.9 kg (315 lb 0.6 oz)  09/14/14 147.419 kg (325 lb)  06/02/14 146.965 kg (324 lb)    History of present illness:  44 y.o. year old male with significant past medical history of morbid obesity, knee DJD s/p TKR presenting with PE, hyperglycemia, elevated trop. Pt states that he has had cough and mild increased WOB over past 3 weeks. Pt initially thought sxs were due to possible bronchitis. Pt waited after 2 weeks to be seen. Went to ER. Was given abx w/ minimal improvement in sxs. Sxs persisted and progressively worsened. Pt came in today for reevaluation at [facility] ER . In ER T 98.6, HR 90s-110s, resp 10s-30s, BP 90s-130s, Satting 99% on 2L. CBC and BMET grossly WNL apart from glu 432. Pt denies any prior hx/o known DM. Trop 0.08. EKG w/ accelerated junctional rhythm. CT Angio shows Large volume bilateral acute pulmonary emboli with evidence of right heart strain and saddle embolus configuration. PCCM consulted. Pt given tx dose lovenox. Initial plan was for pt to be transferred to stepdown. However, no step down beds available. Was contacted by Dr. Jimmy Footman via Karen Chafe. Dr. Jimmy Footman feels pt appropriate for telemetry.  The patient was started on subcutaneous Lovenox. He remained clinically stable without respiratory distress, hypoxemia, or hemodynamic instability. He was transitioned to oral rivaroxaban. Critical care medicine continued to follow the patient. The patient tolerated the medications well and did not have any hypoxemia with ambulation. For his diabetes, the patient's hemoglobin A1c was 12.0. This was a new diagnosis. He was seen by the diabetic educator and dietitian for education. He'll go home with Lantus pen 30 units at bedtime  and NovoLog flex pen 5 units with meals.  Consultants: PCCM  Discharge Exam: Filed Vitals:   10/04/14 0414  BP: 136/86  Pulse:  85  Temp: 98.1 F (36.7 C)  Resp: 16   Filed Vitals:   10/03/14 0459 10/03/14 1401 10/03/14 1934 10/04/14 0414  BP: 121/68 124/81 135/74 136/86  Pulse: 96 82 81 85  Temp: 98.5 F (36.9 C) 98.5 F (36.9 C) 98.7 F (37.1 C) 98.1 F (36.7 C)  TempSrc: Oral Oral Oral Oral  Resp: _0 Height:      Weight: 144.6 kg (318 lb 12.6 oz)   142.9 kg (315 lb 0.6 oz)  SpO2: 99% 100% 98% 96%   General: A&O x 3, NAD, pleasant, cooperative Cardiovascular: RRR, no rub, no gallop, no S3 Respiratory: CTAB, no wheeze, no rhonchi Abdomen:soft, nontender, nondistended, positive bowel sounds Extremities: No edema, No lymphangitis, no petechiae  Discharge Instructions  Discharge Instructions    Ambulatory referral to Nutrition and Diabetic Education    Complete by:  As directed   New onset diabetes     Ambulatory referral to Nutrition and Diabetic Education    Complete by:  As directed   New onset diabetes            Medication List    STOP taking these medications        benzonatate 100 MG capsule  Commonly known as:  TESSALON     fluticasone 50 MCG/ACT nasal spray  Commonly known as:  FLONASE     Guaifenesin 1200 MG Tb12     ibuprofen 600 MG tablet  Commonly known as:  ADVIL,MOTRIN      TAKE these medications        acetaminophen 500 MG tablet  Commonly known as:  TYLENOL  Take 3,000 mg by mouth every 6 (six) hours as needed for mild pain.     blood glucose meter kit and supplies Kit  Dispense based on patient and insurance preference. Use up to four times daily as directed. (FOR ICD-9 250.00, 250.01).     insulin aspart 100 UNIT/ML FlexPen  Commonly known as:  NOVOLOG FLEXPEN  Inject 5 Units into the skin 3 (three) times daily with meals.     Insulin Glargine 100 UNIT/ML Solostar Pen  Commonly known as:  LANTUS SOLOSTAR  Inject 30 Units into the skin daily at 10 pm.     Insulin Pen Needle 32G X 4 MM Misc  Use with insulin pens to dispense insulin as directed      Rivaroxaban 15 MG Tabs tablet  Commonly known as:  XARELTO  Take 1 tablet (15 mg total) by mouth 2 (two) times daily with a meal.     rivaroxaban 20 MG Tabs tablet  Commonly known as:  XARELTO  Take 1 tablet (20 mg total) by mouth daily with supper. Start day after finished with 18m pills     traMADol 50 MG tablet  Commonly known as:  ULTRAM  Take 100 mg by mouth every 6 (six) hours as needed for moderate pain.         The results of significant diagnostics from this hospitalization (including imaging, microbiology, ancillary and laboratory) are listed below for reference.    Significant Diagnostic Studies: Dg Chest 2 View  09/30/2014   CLINICAL DATA:  Acute onset of cough, shortness of breath and difficulty breathing. Initial encounter.  EXAM: CHEST  2 VIEW  COMPARISON:  Chest radiograph 08/30/2015 2016  FINDINGS: The  lungs are well-aerated. Mild vascular congestion and vascular crowding are seen. There is no evidence of focal opacification, pleural effusion or pneumothorax.  The heart is normal in size; the mediastinal contour is within normal limits. No acute osseous abnormalities are seen.  IMPRESSION: Mild vascular congestion noted.  Lungs remain grossly clear.   Electronically Signed   By: Garald Balding M.D.   On: 09/30/2014 18:42   Dg Chest 2 View  09/14/2014   CLINICAL DATA:  Initial encounter for cough for 2 weeks. Low grade fever.  EXAM: CHEST  2 VIEW  COMPARISON:  11/09/2009  FINDINGS: Midline trachea.  Normal heart size and mediastinal contours.  Sharp costophrenic angles.  No pneumothorax.  Clear lungs.  IMPRESSION: No active cardiopulmonary disease.   Electronically Signed   By: Abigail Miyamoto M.D.   On: 09/14/2014 09:41   Ct Angio Chest Pe W/cm &/or Wo Cm  09/30/2014   CLINICAL DATA:  Shortness of breath, difficulty breathing. Elevated D-dimer.  EXAM: CT ANGIOGRAPHY CHEST WITH CONTRAST  TECHNIQUE: Multidetector CT imaging of the chest was performed using the standard  protocol during bolus administration of intravenous contrast. Multiplanar CT image reconstructions and MIPs were obtained to evaluate the vascular anatomy.  CONTRAST:  126m OMNIPAQUE IOHEXOL 350 MG/ML SOLN  COMPARISON:  Chest radiograph same day  FINDINGS: There is large volume acute pulmonary embolism extending to all main pulmonary arterial segments, with central saddle type configuration. Evidence of right heart strain is identified, with RV:LV ratio of 1.2. Detail is minimally obscured by early bolus timing and likely pressure effect due to right heart strain but this is not felt to significantly affect the diagnostic quality or findings on the current exam. Heart size is normal.  Aorta is normal in caliber. No lymphadenopathy. No pericardial or pleural effusion.  The lungs are clear.  Central airways are patent.  No acute osseous abnormality.  Review of the MIP images confirms the above findings.  IMPRESSION: Large volume bilateral acute pulmonary emboli with evidence of right heart strain and saddle embolus configuration. Critical Value/emergent results were called by telephone at the time of interpretation on 09/30/2014 at 8:16 pm to Dr. EQuintella Reichert, who verbally acknowledged these results.   Electronically Signed   By: GConchita ParisM.D.   On: 09/30/2014 20:16   Dg Chest 2v Repeat Same Day  10/03/2014   CLINICAL DATA:  Dyspnea.  EXAM: CHEST - 2 VIEW SAME DAY  COMPARISON:  September 30, 2014.  FINDINGS: Cardiomediastinal silhouette appears normal. No pneumothorax or pleural effusion is noted. Stable mild central pulmonary vascular congestion is noted. No consolidative process is noted. Bony thorax is intact.  IMPRESSION: Stable mild central pulmonary vascular congestion. No significant change compared to prior exam.   Electronically Signed   By: JSabino DickM.D.   On: 10/03/2014 13:32     Microbiology: No results found for this or any previous visit (from the past 240 hour(s)).   Labs: Basic  Metabolic Panel:  Recent Labs Lab 09/30/14 1820 10/01/14 0524 10/02/14 0500 10/03/14 0550 10/04/14 0325  NA 132* 133* 134* 136 134*  K 3.8 4.2 4.3 3.9 3.9  CL 99 100 101 102 99  CO2 _0 GLUCOSE 435* 362* 312* 295* 286*  BUN _1 5* 6  CREATININE 1.07 0.95 0.86 1.02 0.91  CALCIUM 8.7 8.5 8.4 8.6 8.6   Liver Function Tests:  Recent Labs Lab 10/01/14 0524 10/02/14 0500 10/03/14 0550 10/04/14 0325  AST _0 34  ALT _1 38  ALKPHOS 64 64 56 57  BILITOT 0.3 0.5 0.4 0.6  PROT 6.8 6.3 6.0 6.8  ALBUMIN 3.3* 3.1* 2.8* 2.9*   No results for input(s): LIPASE, AMYLASE in the last 168 hours. No results for input(s): AMMONIA in the last 168 hours. CBC:  Recent Labs Lab 09/30/14 1820 10/01/14 0524 10/02/14 0500 10/03/14 0550 10/04/14 0325  WBC 9.2 7.7 7.0 6.4 6.8  NEUTROABS 4.5 3.1 3.1 2.9 3.0  HGB 14.4 13.9 13.6 13.3 12.9*  HCT 42.8 40.8 39.6 39.8 38.6*  MCV 81.5 80.8 80.7 81.9 79.9  PLT 301 265 241 240 243   Cardiac Enzymes:  Recent Labs Lab 09/30/14 1820 10/01/14 0201 10/03/14 0550  TROPONINI 0.08* 0.15* 0.06*   BNP: Invalid input(s): POCBNP CBG:  Recent Labs Lab 10/03/14 1127 10/03/14 1618 10/03/14 2050 10/04/14 0626 10/04/14 1148  GLUCAP 234* 226* 243* 251* 255*    Time coordinating discharge:  Greater than 30 minutes  Signed:  Sharada Albornoz, DO [facility] Pager: [phone] 10/04/2014, 2:26 PM

## 2014-10-05 NOTE — Progress Notes (Signed)
CARE MANAGEMENT NOTE 10/05/2014  Patient:  Erlene QuanSPRINGS,Ozan L   Account Number:  [id number]  Date Initiated:  10/03/2014  Documentation initiated by:  Donn PieriniWEBSTER,KRISTI  Subjective/Objective Assessment:   Pt admitted for PE/DVT     Action/Plan:   PTA pt lived at home- independent   Anticipated DC Date:  10/04/2014   Anticipated DC Plan:  HOME/SELF CARE      DC Planning Services  CM consult  Medication Assistance      Choice offered to / List presented to:             Status of service:  Completed, signed off Medicare Important Message given?   (If response is "NO", the following Medicare IM given date fields will be blank) Date Medicare IM given:   Medicare IM given by:   Date Additional Medicare IM given:   Additional Medicare IM given by:    Discharge Disposition:  HOME/SELF CARE  Per UR Regulation:  Reviewed for med. necessity/level of care/duration of stay  If discussed at Long Length of Stay Meetings, dates discussed:    Comments:  10/05/14 CM received call from MD requesting I call pt's pharmacy as there is a problem with the free 30 day Xarelto card.  CM called [facility] and spoke to pharmacist, Gus who states pt has no card.  CM faxed a free 30-day trial card to cover the first 19 days and faxed a $c0-pay so Gus the pharmacist can keep on file to use after authorization has been  accomplished by the pt's insurance.  CM called Md and pt to update.  No other CM needs were communicated.  Idolina PrimerSarah Teon Hudnall,BSN, [place]CM [phone].  10/03/14- 1400- Donn PieriniKristi Webster RN, BSN [phone] Referral for Xarelto- per rep at [facility]:  xarelto: auth required [phone]/ unable to provide co-pay structure without prior auth  spoke with pt at bedside- given above info- also gave 30 day free card along with copay assist card for Xarelto. No further needs identified.

## 2014-10-06 LAB — URINE CULTURE: Colony Count: >=100000

## 2014-10-16 ENCOUNTER — Inpatient Hospital Stay: Payer: BLUE CROSS/BLUE SHIELD | Admitting: Adult Health

## 2014-10-31 ENCOUNTER — Inpatient Hospital Stay (HOSPITAL_BASED_OUTPATIENT_CLINIC_OR_DEPARTMENT_OTHER)
Admission: EM | Admit: 2014-10-31 | Discharge: 2014-11-03 | DRG: 176 | Disposition: A | Discharge status: Home / Self Care | Payer: BLUE CROSS/BLUE SHIELD | Attending: Internal Medicine | Admitting: Internal Medicine

## 2014-10-31 ENCOUNTER — Emergency Department (HOSPITAL_BASED_OUTPATIENT_CLINIC_OR_DEPARTMENT_OTHER): Payer: BLUE CROSS/BLUE SHIELD

## 2014-10-31 ENCOUNTER — Encounter (HOSPITAL_BASED_OUTPATIENT_CLINIC_OR_DEPARTMENT_OTHER): Payer: Self-pay | Admitting: *Deleted

## 2014-10-31 ENCOUNTER — Other Ambulatory Visit (HOSPITAL_BASED_OUTPATIENT_CLINIC_OR_DEPARTMENT_OTHER): Payer: Self-pay

## 2014-10-31 DIAGNOSIS — Z7901 Long term (current) use of anticoagulants: Secondary | ICD-10-CM

## 2014-10-31 DIAGNOSIS — E1165 Type 2 diabetes mellitus with hyperglycemia: Secondary | ICD-10-CM | POA: Diagnosis present

## 2014-10-31 DIAGNOSIS — R0602 Shortness of breath: Secondary | ICD-10-CM

## 2014-10-31 DIAGNOSIS — Z96652 Presence of left artificial knee joint: Secondary | ICD-10-CM | POA: Diagnosis present

## 2014-10-31 DIAGNOSIS — Z86711 Personal history of pulmonary embolism: Secondary | ICD-10-CM

## 2014-10-31 DIAGNOSIS — Z794 Long term (current) use of insulin: Secondary | ICD-10-CM

## 2014-10-31 DIAGNOSIS — R05 Cough: Secondary | ICD-10-CM | POA: Diagnosis present

## 2014-10-31 DIAGNOSIS — I2699 Other pulmonary embolism without acute cor pulmonale: Principal | ICD-10-CM | POA: Diagnosis present

## 2014-10-31 DIAGNOSIS — R55 Syncope and collapse: Secondary | ICD-10-CM | POA: Diagnosis present

## 2014-10-31 DIAGNOSIS — IMO0002 Reserved for concepts with insufficient information to code with codable children: Secondary | ICD-10-CM | POA: Diagnosis present

## 2014-10-31 DIAGNOSIS — K219 Gastro-esophageal reflux disease without esophagitis: Secondary | ICD-10-CM | POA: Diagnosis present

## 2014-10-31 DIAGNOSIS — R054 Cough syncope: Secondary | ICD-10-CM | POA: Diagnosis present

## 2014-10-31 DIAGNOSIS — Z6841 Body Mass Index (BMI) 40.0 and over, adult: Secondary | ICD-10-CM

## 2014-10-31 DIAGNOSIS — Z86718 Personal history of other venous thrombosis and embolism: Secondary | ICD-10-CM

## 2014-10-31 LAB — BASIC METABOLIC PANEL
Anion gap: 3 — ABNORMAL LOW (ref 5–15)
BUN: 11 mg/dL (ref 6–23)
CHLORIDE: 103 mmol/L (ref 96–112)
CO2: 24 mmol/L (ref 19–32)
Calcium: 8.3 mg/dL — ABNORMAL LOW (ref 8.4–10.5)
Creatinine, Ser: 0.85 mg/dL (ref 0.50–1.35)
GFR calc Af Amer: >90 mL/min (ref >90)
GLUCOSE: 245 mg/dL — AB (ref 70–99)
POTASSIUM: 3.4 mmol/L — AB (ref 3.5–5.1)
SODIUM: 130 mmol/L — AB (ref 135–145)

## 2014-10-31 LAB — CBC WITH DIFFERENTIAL/PLATELET
BASOS PCT: 0 % (ref 0–1)
Basophils Absolute: 0 10*3/uL (ref 0.0–0.1)
EOS ABS: 0.1 10*3/uL (ref 0.0–0.7)
Eosinophils Relative: 1 % (ref 0–5)
HCT: 42.3 % (ref 39.0–52.0)
HEMOGLOBIN: 14.2 g/dL (ref 13.0–17.0)
Lymphocytes Relative: 43 % (ref 12–46)
Lymphs Abs: 3.3 10*3/uL (ref 0.7–4.0)
MCH: 27.3 pg (ref 26.0–34.0)
MCHC: 33.6 g/dL (ref 30.0–36.0)
MCV: 81.2 fL (ref 78.0–100.0)
MONO ABS: 0.7 10*3/uL (ref 0.1–1.0)
MONOS PCT: 9 % (ref 3–12)
Neutro Abs: 3.6 10*3/uL (ref 1.7–7.7)
Neutrophils Relative %: 47 % (ref 43–77)
Platelets: 354 10*3/uL (ref 150–400)
RBC: 5.21 MIL/uL (ref 4.22–5.81)
RDW: 13.8 % (ref 11.5–15.5)
WBC: 7.7 10*3/uL (ref 4.0–10.5)

## 2014-10-31 LAB — URINALYSIS, ROUTINE W REFLEX MICROSCOPIC
Bilirubin Urine: NEGATIVE
GLUCOSE, UA: 500 mg/dL — AB
Ketones, ur: NEGATIVE mg/dL
Nitrite: NEGATIVE
Protein, ur: NEGATIVE mg/dL
SPECIFIC GRAVITY, URINE: 1.024 (ref 1.005–1.030)
Urobilinogen, UA: 0.2 mg/dL (ref 0.0–1.0)
pH: 5.5 (ref 5.0–8.0)

## 2014-10-31 LAB — URINE MICROSCOPIC-ADD ON

## 2014-10-31 MED ORDER — SODIUM CHLORIDE 0.9 % IV BOLUS (SEPSIS)
1000.0000 mL | Freq: Once | INTRAVENOUS | Status: AC
  Administered 2014-10-31: 1000 mL via INTRAVENOUS

## 2014-10-31 MED ORDER — IOHEXOL 350 MG/ML SOLN
100.0000 mL | Freq: Once | INTRAVENOUS | Status: AC | PRN
  Administered 2014-10-31: 180 mL via INTRAVENOUS
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
## 2014-10-31 NOTE — ED Notes (Signed)
MD at bedside discussing test results and plan of care. 

## 2014-10-31 NOTE — ED Notes (Signed)
Recent syncope while walking in to house after coming in from the store, became light headed and sob, sat down, coughed a little bit, stood up & then passed out, aroused and then vomited x1 . Relates sx to "actively being treated for PE", taking xarelto and insulin. Pt sob. Denies pain. Alert, NAD, calm, interactive.

## 2014-10-31 NOTE — ED Notes (Signed)
Patient transported to CT 

## 2014-11-01 ENCOUNTER — Encounter (HOSPITAL_COMMUNITY): Payer: Self-pay | Admitting: Internal Medicine

## 2014-11-01 DIAGNOSIS — I2699 Other pulmonary embolism without acute cor pulmonale: Secondary | ICD-10-CM | POA: Diagnosis present

## 2014-11-01 DIAGNOSIS — R55 Syncope and collapse: Secondary | ICD-10-CM

## 2014-11-01 DIAGNOSIS — R0602 Shortness of breath: Secondary | ICD-10-CM | POA: Diagnosis present

## 2014-11-01 DIAGNOSIS — Z7901 Long term (current) use of anticoagulants: Secondary | ICD-10-CM | POA: Diagnosis not present

## 2014-11-01 DIAGNOSIS — R05 Cough: Secondary | ICD-10-CM | POA: Diagnosis present

## 2014-11-01 DIAGNOSIS — IMO0002 Reserved for concepts with insufficient information to code with codable children: Secondary | ICD-10-CM | POA: Diagnosis present

## 2014-11-01 DIAGNOSIS — K219 Gastro-esophageal reflux disease without esophagitis: Secondary | ICD-10-CM | POA: Diagnosis present

## 2014-11-01 DIAGNOSIS — E1165 Type 2 diabetes mellitus with hyperglycemia: Secondary | ICD-10-CM | POA: Diagnosis present

## 2014-11-01 DIAGNOSIS — R054 Cough syncope: Secondary | ICD-10-CM | POA: Diagnosis present

## 2014-11-01 DIAGNOSIS — Z86718 Personal history of other venous thrombosis and embolism: Secondary | ICD-10-CM | POA: Diagnosis not present

## 2014-11-01 DIAGNOSIS — Z794 Long term (current) use of insulin: Secondary | ICD-10-CM | POA: Diagnosis not present

## 2014-11-01 DIAGNOSIS — Z6841 Body Mass Index (BMI) 40.0 and over, adult: Secondary | ICD-10-CM | POA: Diagnosis not present

## 2014-11-01 DIAGNOSIS — T800XXA Air embolism following infusion, transfusion and therapeutic injection, initial encounter: Secondary | ICD-10-CM

## 2014-11-01 DIAGNOSIS — Z86711 Personal history of pulmonary embolism: Secondary | ICD-10-CM | POA: Diagnosis not present

## 2014-11-01 DIAGNOSIS — Z96652 Presence of left artificial knee joint: Secondary | ICD-10-CM | POA: Diagnosis present

## 2014-11-01 LAB — CBC WITH DIFFERENTIAL/PLATELET
Basophils Absolute: 0 10*3/uL (ref 0.0–0.1)
Basophils Relative: 0 % (ref 0–1)
EOS PCT: 2 % (ref 0–5)
Eosinophils Absolute: 0.1 10*3/uL (ref 0.0–0.7)
HCT: 41.5 % (ref 39.0–52.0)
Hemoglobin: 13.9 g/dL (ref 13.0–17.0)
Lymphocytes Relative: 42 % (ref 12–46)
Lymphs Abs: 2.5 10*3/uL (ref 0.7–4.0)
MCH: 27.5 pg (ref 26.0–34.0)
MCHC: 33.5 g/dL (ref 30.0–36.0)
MCV: 82 fL (ref 78.0–100.0)
MONO ABS: 0.3 10*3/uL (ref 0.1–1.0)
Monocytes Relative: 5 % (ref 3–12)
Neutro Abs: 3 10*3/uL (ref 1.7–7.7)
Neutrophils Relative %: 51 % (ref 43–77)
Platelets: 337 10*3/uL (ref 150–400)
RBC: 5.06 MIL/uL (ref 4.22–5.81)
RDW: 13.6 % (ref 11.5–15.5)
WBC: 5.9 10*3/uL (ref 4.0–10.5)

## 2014-11-01 LAB — RAPID URINE DRUG SCREEN, HOSP PERFORMED
Amphetamines: NOT DETECTED
BENZODIAZEPINES: NOT DETECTED
Barbiturates: NOT DETECTED
Cocaine: NOT DETECTED
Opiates: NOT DETECTED
Tetrahydrocannabinol: NOT DETECTED

## 2014-11-01 LAB — TROPONIN I: Troponin I: <0.03 ng/mL (ref <0.031)

## 2014-11-01 LAB — GLUCOSE, CAPILLARY
GLUCOSE-CAPILLARY: 163 mg/dL — AB (ref 70–99)
Glucose-Capillary: 151 mg/dL — ABNORMAL HIGH (ref 70–99)
Glucose-Capillary: 196 mg/dL — ABNORMAL HIGH (ref 70–99)
Glucose-Capillary: 117 mg/dL — ABNORMAL HIGH (ref 70–99)

## 2014-11-01 LAB — APTT
APTT: 46 s — AB (ref 24–37)
aPTT: 45 seconds — ABNORMAL HIGH (ref 24–37)
aPTT: 42 seconds — ABNORMAL HIGH (ref 24–37)

## 2014-11-01 LAB — COMPREHENSIVE METABOLIC PANEL
ALT: 29 U/L (ref 0–53)
AST: 28 U/L (ref 0–37)
Albumin: 3.1 g/dL — ABNORMAL LOW (ref 3.5–5.2)
Alkaline Phosphatase: 48 U/L (ref 39–117)
Anion gap: 6 (ref 5–15)
BUN: 7 mg/dL (ref 6–23)
CHLORIDE: 100 mmol/L (ref 96–112)
CO2: 30 mmol/L (ref 19–32)
Calcium: 8.4 mg/dL (ref 8.4–10.5)
Creatinine, Ser: 1 mg/dL (ref 0.50–1.35)
GFR calc Af Amer: >90 mL/min (ref >90)
GFR calc non Af Amer: >90 mL/min (ref >90)
GLUCOSE: 216 mg/dL — AB (ref 70–99)
Potassium: 3.6 mmol/L (ref 3.5–5.1)
SODIUM: 136 mmol/L (ref 135–145)
Total Bilirubin: 0.7 mg/dL (ref 0.3–1.2)
Total Protein: 6.5 g/dL (ref 6.0–8.3)

## 2014-11-01 LAB — BRAIN NATRIURETIC PEPTIDE: B NATRIURETIC PEPTIDE 5: 9.7 pg/mL (ref 0.0–100.0)

## 2014-11-01 LAB — MAGNESIUM: Magnesium: 1.8 mg/dL (ref 1.5–2.5)

## 2014-11-01 LAB — CBG MONITORING, ED: Glucose-Capillary: 165 mg/dL — ABNORMAL HIGH (ref 70–99)

## 2014-11-01 MED ORDER — DIPHENHYDRAMINE HCL 25 MG PO CAPS
25.0000 mg | ORAL_CAPSULE | Freq: Once | ORAL | Status: AC
  Administered 2014-11-01: 25 mg via ORAL
  Filled 2014-11-01: qty 1

## 2014-11-01 MED ORDER — ARGATROBAN 50 MG/50ML IV SOLN
1.8000 ug/kg/min | INTRAVENOUS | Status: DC
  Administered 2014-11-01: 1.5 ug/kg/min via INTRAVENOUS
  Administered 2014-11-01 (×2): 1 ug/kg/min via INTRAVENOUS
  Administered 2014-11-01: 1.2 ug/kg/min via INTRAVENOUS
  Administered 2014-11-02 (×3): 1.8 ug/kg/min via INTRAVENOUS
  Filled 2014-11-01 (×7): qty 50

## 2014-11-01 MED ORDER — DIPHENHYDRAMINE HCL 50 MG/ML IJ SOLN
25.0000 mg | Freq: Four times a day (QID) | INTRAMUSCULAR | Status: DC | PRN
  Administered 2014-11-01 – 2014-11-02 (×4): 25 mg via INTRAVENOUS
  Filled 2014-11-01 (×4): qty 1

## 2014-11-01 MED ORDER — HEPARIN (PORCINE) IN NACL 100-0.45 UNIT/ML-% IJ SOLN
1800.0000 [IU]/h | INTRAMUSCULAR | Status: DC
  Administered 2014-11-01: 1800 [IU]/h via INTRAVENOUS
  Filled 2014-11-01: qty 250

## 2014-11-01 MED ORDER — ONDANSETRON HCL 4 MG PO TABS: 4.0000 mg | ORAL_TABLET | Freq: Four times a day (QID) | ORAL | Status: DC | PRN

## 2014-11-01 MED ORDER — SODIUM CHLORIDE 0.9 % IV SOLN
INTRAVENOUS | Status: AC
  Administered 2014-11-01: 75 mL/h via INTRAVENOUS
  Administered 2014-11-01: 04:00:00 via INTRAVENOUS
  Administered 2014-11-01: 75 mL/h via INTRAVENOUS

## 2014-11-01 MED ORDER — ONDANSETRON HCL 4 MG/2ML IJ SOLN: 4.0000 mg | Freq: Four times a day (QID) | INTRAMUSCULAR | Status: DC | PRN

## 2014-11-01 MED ORDER — DIPHENHYDRAMINE HCL 25 MG PO CAPS
50.0000 mg | ORAL_CAPSULE | Freq: Once | ORAL | Status: AC
  Administered 2014-11-01: 50 mg via ORAL
  Filled 2014-11-01: qty 2

## 2014-11-01 MED ORDER — HEPARIN BOLUS VIA INFUSION
7000.0000 [IU] | Freq: Once | INTRAVENOUS | Status: AC
  Administered 2014-11-01: 7000 [IU] via INTRAVENOUS
  Filled 2014-11-01: qty 7000

## 2014-11-01 MED ORDER — INSULIN GLARGINE 100 UNIT/ML ~~LOC~~ SOLN
30.0000 [IU] | Freq: Every day | SUBCUTANEOUS | Status: DC
  Administered 2014-11-01 (×2): 30 [IU] via SUBCUTANEOUS
  Filled 2014-11-01 (×3): qty 0.3

## 2014-11-01 MED ORDER — INSULIN ASPART 100 UNIT/ML ~~LOC~~ SOLN
0.0000 [IU] | Freq: Three times a day (TID) | SUBCUTANEOUS | Status: DC
  Administered 2014-11-01 – 2014-11-03 (×6): 2 [IU] via SUBCUTANEOUS

## 2014-11-01 NOTE — Progress Notes (Signed)
  Echocardiogram 2D Echocardiogram has been performed.  Aris EvertsRix, Jaeshaun Riva A 11/01/2014, 4:27 PM
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
## 2014-11-01 NOTE — Progress Notes (Addendum)
VASCULAR LAB PRELIMINARY  PRELIMINARY  PRELIMINARY  PRELIMINARY  Bilateral lower extremity venous Dopplers completed.    Preliminary report:  There is age indeterminate DVT noted in the right posterior tibial vein. All other DVT found 10/01/14 appears resolved.   Liora Myles, RVT 11/01/2014, 12:18 PM

## 2014-11-01 NOTE — Progress Notes (Signed)
ANTICOAGULATION CONSULT NOTE   Pharmacy Consult for Argatroban  Indication: pulmonary embolus (09/30/14)  Allergies  Allergen Reactions  . Heparin Itching    Itching over entire body  . Lovenox [Enoxaparin Sodium] Itching    Itching over entire body  . Percocet [Oxycodone-Acetaminophen] Hives and Itching   Patient Measurements: Height: 6' (182.9 cm) Weight: (!) 322 lb 5 oz (146.2 kg) IBW/kg (Calculated) : 77.6  Vital Signs: Temp: 98.2 F (36.8 C) (03/05 1100) Temp Source: Oral (03/05 1100) BP: 102/68 mmHg (03/05 1100) Pulse Rate: 83 (03/05 1100)  Labs:  Recent Labs  10/31/14 2020 11/01/14 0510 11/01/14 0935 11/01/14 1130  HGB 14.2  --  13.9  --   HCT 42.3  --  41.5  --   PLT 354  --  337  --   APTT  --   --   --  46*  CREATININE 0.85  --  1.00  --   TROPONINI  --  <0.03 <0.03  --     Estimated Creatinine Clearance: 142.9 mL/min (by C-G formula based on Cr of 1).   Medical History: Past Medical History  Diagnosis Date  . Prediabetes on 04-28-14  . GERD (gastroesophageal reflux disease)   . Headache(784.0)     migraine  . Arthritis     oa  . Anemia age 89 or 7    none since  . Head problem     back of top of head area swells at times, had glass in wound at times, pt instrcuted to follow up with md, if any pus seen  . Diabetes mellitus without complication     newly diagnosed  . PE (pulmonary embolism) 10/01/2014    Assessment: 44 y/o M with recent PE.   Heparin started but then discontinued d/t itching which has now progressed to redness on upper part of chest hours after heparin stopped? Benadryl ordered.   Aptt low 46 (heparin bolus should be completely out of system by now), delayed results due to lab error. No bleeding issues noted, will adjust rate and recheck aptt this evening.  Goal of Therapy:  aPTT 50-90 seconds Monitor platelets by anticoagulation protocol: Yes   Plan:  -Increase argatroban infusion to 1.2 mcg/kg/min -Check aPTT ~2 hours  after rate change -Daily aptt once at goal  Sheppard CoilFrank Wilson PharmD., BCPS Clinical Pharmacist Pager [phone] 11/01/2014 2:24 PM
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
## 2014-11-01 NOTE — Progress Notes (Signed)
ANTICOAGULATION CONSULT NOTE   Pharmacy Consult for Argatroban  Indication: pulmonary embolus (09/30/14)  Allergies  Allergen Reactions  . Heparin Itching    Itching over entire body  . Lovenox [Enoxaparin Sodium] Itching    Itching over entire body  . Percocet [Oxycodone-Acetaminophen] Hives and Itching   Patient Measurements: Height: 6' (182.9 cm) Weight: (!) 322 lb 5 oz (146.2 kg) IBW/kg (Calculated) : 77.6  Vital Signs: Temp: 97.9 F (36.6 C) (03/05 1418) Temp Source: Oral (03/05 1418) BP: 106/64 mmHg (03/05 1418) Pulse Rate: 77 (03/05 1418)  Labs:  Recent Labs  10/31/14 2020 11/01/14 0510 11/01/14 0935 11/01/14 1130 11/01/14 1510 11/01/14 1703  HGB 14.2  --  13.9  --   --   --   HCT 42.3  --  41.5  --   --   --   PLT 354  --  337  --   --   --   APTT  --   --   --  46*  --  45*  CREATININE 0.85  --  1.00  --   --   --   TROPONINI  --  <0.03 <0.03  --  <0.03  --     Estimated Creatinine Clearance: 142.9 mL/min (by C-G formula based on Cr of 1).   Medical History: Past Medical History  Diagnosis Date  . Prediabetes on 04-28-14  . GERD (gastroesophageal reflux disease)   . Headache(784.0)     migraine  . Arthritis     oa  . Anemia age 44 or 7    none since  . Head problem     back of top of head area swells at times, had glass in wound at times, pt instrcuted to follow up with md, if any pus seen  . Diabetes mellitus without complication     newly diagnosed  . PE (pulmonary embolism) 10/01/2014    Assessment: 44 y/o M with recent PE on heparin and he developed itching over entire body. He is now on argatroban at 1.43mcg/kg/min and aPTT after increase is still low (aPTT= 45)  Goal of Therapy:  aPTT 50-90 seconds Monitor platelets by anticoagulation protocol: Yes   Plan:  -Increase argatroban infusion to 1.5 mcg/kg/min -Check aPTT ~2 hours after rate change -Daily aptt once at goal  Harland Germanndrew Lonny Eisen, Pharm D 11/01/2014 6:48 PM

## 2014-11-01 NOTE — Progress Notes (Addendum)
ANTICOAGULATION CONSULT NOTE - Initial Consult  Pharmacy Consult for Argatroban  Indication: pulmonary embolus (09/30/14)  Allergies  Allergen Reactions  . Percocet [Oxycodone-Acetaminophen] Hives and Itching   Patient Measurements: Height: 6' (182.9 cm) Weight: (!) 322 lb 5 oz (146.2 kg) IBW/kg (Calculated) : 77.6  Vital Signs: Temp: 98.5 F (36.9 C) (03/05 0208) Temp Source: Oral (03/05 0208) BP: 146/86 mmHg (03/05 0208) Pulse Rate: 84 (03/05 0208)  Labs:  Recent Labs  10/31/14 2020  HGB 14.2  HCT 42.3  PLT 354  CREATININE 0.85    Estimated Creatinine Clearance: 168.1 mL/min (by C-G formula based on Cr of 0.85).   Medical History: Past Medical History  Diagnosis Date  . Prediabetes on 04-28-14  . GERD (gastroesophageal reflux disease)   . Headache(784.0)     migraine  . Arthritis     oa  . Anemia age 89 or 7    none since  . Head problem     back of top of head area swells at times, had glass in wound at times, pt instrcuted to follow up with md, if any pus seen  . Diabetes mellitus without complication     newly diagnosed  . PE (pulmonary embolism) 10/01/2014    Assessment: 44 y/o M with recent PE, about 15 minutes after heparin drip was started he developed itching over entire body. After further talking with pt, when her was here last month he had itching with Lovenox shots but they were continued with benadryl administration. MD now wants to start argatroban.   Goal of Therapy:  aPTT 50-90 seconds Monitor platelets by anticoagulation protocol: Yes   Plan:  -Begin argatroban infusion at 1 mcg/kg/min -Check aPTT about 3-4 hours after start of infusion (delaying aPTT time due to pt given large bolus of heparin ~0330 which will have some lab interference with the aPTT)  Abran DukeLedford, Tressia Labrum 11/01/2014,3:59 AM

## 2014-11-01 NOTE — Progress Notes (Signed)
ANTICOAGULATION CONSULT NOTE   Pharmacy Consult for Argatroban  Indication: pulmonary embolus (09/30/14)  Allergies  Allergen Reactions  . Heparin Itching    Itching over entire body  . Lovenox [Enoxaparin Sodium] Itching    Itching over entire body  . Percocet [Oxycodone-Acetaminophen] Hives and Itching   Patient Measurements: Height: 6' (182.9 cm) Weight: (!) 322 lb 5 oz (146.2 kg) IBW/kg (Calculated) : 77.6  Vital Signs: Temp: 98.6 F (37 C) (03/05 2042) Temp Source: Oral (03/05 2042) BP: 122/80 mmHg (03/05 2042) Pulse Rate: 88 (03/05 2042)  Labs:  Recent Labs  10/31/14 2020 11/01/14 0510 11/01/14 0935 11/01/14 1130 11/01/14 1510 11/01/14 1703 11/01/14 2129  HGB 14.2  --  13.9  --   --   --   --   HCT 42.3  --  41.5  --   --   --   --   PLT 354  --  337  --   --   --   --   APTT  --   --   --  46*  --  45* 42*  CREATININE 0.85  --  1.00  --   --   --   --   TROPONINI  --  <0.03 <0.03  --  <0.03  --   --     Estimated Creatinine Clearance: 142.9 mL/min (by C-G formula based on Cr of 1).   Medical History: Past Medical History  Diagnosis Date  . Prediabetes on 04-28-14  . GERD (gastroesophageal reflux disease)   . Headache(784.0)     migraine  . Arthritis     oa  . Anemia age 89 or 7    none since  . Head problem     back of top of head area swells at times, had glass in wound at times, pt instrcuted to follow up with md, if any pus seen  . Diabetes mellitus without complication     newly diagnosed  . PE (pulmonary embolism) 10/01/2014    Assessment: 44 y/o M with recent PE on heparin and he developed itching over entire body. He is now on argatroban at 1.782mcg/kg/min and aPTT after increase is still low (aPTT= 45).  APTT at 1.245mcg/kg/min is 42, will increase by 20% to 1.328mcg/kg/min.  Goal of Therapy:  aPTT 50-90 seconds Monitor platelets by anticoagulation protocol: Yes   Plan:  -Increase argatroban infusion to 1.8 mcg/kg/min -Check aPTT ~2 hours  after rate change -Daily aptt once at goal  Harland Germanndrew Meyer, Pharm D 11/01/2014 10:36 PM
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
## 2014-11-02 DIAGNOSIS — R55 Syncope and collapse: Secondary | ICD-10-CM | POA: Diagnosis present

## 2014-11-02 LAB — CBC
HEMATOCRIT: 39 % (ref 39.0–52.0)
Hemoglobin: 13.1 g/dL (ref 13.0–17.0)
MCH: 27.4 pg (ref 26.0–34.0)
MCHC: 33.6 g/dL (ref 30.0–36.0)
MCV: 81.6 fL (ref 78.0–100.0)
Platelets: 287 10*3/uL (ref 150–400)
RBC: 4.78 MIL/uL (ref 4.22–5.81)
RDW: 13.5 % (ref 11.5–15.5)
WBC: 6.8 10*3/uL (ref 4.0–10.5)

## 2014-11-02 LAB — APTT
APTT: 48 s — AB (ref 24–37)
APTT: 41 s — AB (ref 24–37)
aPTT: 46 seconds — ABNORMAL HIGH (ref 24–37)

## 2014-11-02 LAB — GLUCOSE, CAPILLARY
GLUCOSE-CAPILLARY: 178 mg/dL — AB (ref 70–99)
GLUCOSE-CAPILLARY: 151 mg/dL — AB (ref 70–99)
Glucose-Capillary: 151 mg/dL — ABNORMAL HIGH (ref 70–99)
Glucose-Capillary: 171 mg/dL — ABNORMAL HIGH (ref 70–99)

## 2014-11-02 MED ORDER — RIVAROXABAN 20 MG PO TABS: 20.0000 mg | ORAL_TABLET | Freq: Every day | ORAL | Status: DC

## 2014-11-02 MED ORDER — INSULIN GLARGINE 100 UNIT/ML ~~LOC~~ SOLN
35.0000 [IU] | Freq: Every day | SUBCUTANEOUS | Status: DC
  Administered 2014-11-02: 35 [IU] via SUBCUTANEOUS
  Filled 2014-11-02 (×2): qty 0.35

## 2014-11-02 MED ORDER — BENZONATATE 100 MG PO CAPS
200.0000 mg | ORAL_CAPSULE | Freq: Three times a day (TID) | ORAL | Status: DC | PRN
  Administered 2014-11-02: 200 mg via ORAL
  Filled 2014-11-02: qty 2

## 2014-11-02 MED ORDER — RIVAROXABAN 15 MG PO TABS
15.0000 mg | ORAL_TABLET | Freq: Two times a day (BID) | ORAL | Status: AC
  Administered 2014-11-02 (×2): 15 mg via ORAL
  Filled 2014-11-02 (×2): qty 1

## 2014-11-02 MED ORDER — ARGATROBAN 50 MG/50ML IV SOLN
2.1000 ug/kg/min | INTRAVENOUS | Status: AC
  Administered 2014-11-02: 2.1 ug/kg/min via INTRAVENOUS
  Filled 2014-11-02: qty 50

## 2014-11-02 MED ORDER — RIVAROXABAN 15 MG PO TABS
15.0000 mg | ORAL_TABLET | Freq: Two times a day (BID) | ORAL | Status: DC
  Administered 2014-11-03: 15 mg via ORAL
  Filled 2014-11-02: qty 1
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
## 2014-11-02 NOTE — Progress Notes (Signed)
ANTICOAGULATION CONSULT NOTE   Pharmacy Consult for Argatroban >> xarelto Indication: pulmonary embolus (09/30/14), new or migrated clot 3/4  Allergies  Allergen Reactions  . Heparin Itching    Itching over entire body  . Lovenox [Enoxaparin Sodium] Itching    Itching over entire body  . Percocet [Oxycodone-Acetaminophen] Hives and Itching   Patient Measurements: Height: 6' (182.9 cm) Weight: (!) 318 lb (144.244 kg) IBW/kg (Calculated) : 77.6  Vital Signs: Temp: 98.2 F (36.8 C) (03/06 0443) Temp Source: Oral (03/06 0443) BP: 105/58 mmHg (03/06 0443) Pulse Rate: 82 (03/06 0443)  Labs:  Recent Labs  10/31/14 2020 11/01/14 0510 11/01/14 0935  11/01/14 1510  11/02/14 0039 11/02/14 0401 11/02/14 1014  HGB 14.2  --  13.9  --   --   --   --  13.1  --   HCT 42.3  --  41.5  --   --   --   --  39.0  --   PLT 354  --  337  --   --   --   --  287  --   APTT  --   --   --   < >  --   < > 41* 48* 46*  CREATININE 0.85  --  1.00  --   --   --   --   --   --   TROPONINI  --  <0.03 <0.03  --  <0.03  --   --   --   --   < > = values in this interval not displayed.  Estimated Creatinine Clearance: 141.8 mL/min (by C-G formula based on Cr of 1).   Medical History: Past Medical History  Diagnosis Date  . Prediabetes on 04-28-14  . GERD (gastroesophageal reflux disease)   . Headache(784.0)     migraine  . Arthritis     oa  . Anemia age 89 or 7    none since  . Head problem     back of top of head area swells at times, had glass in wound at times, pt instrcuted to follow up with md, if any pus seen  . Diabetes mellitus without complication     newly diagnosed  . PE (pulmonary embolism) 10/01/2014    Assessment: 44 y/o M with recent diagnosis of large PE on 09/30/14. Pt presents today with syncopal episode. CT Angio reveals bilateral moderate to large PE (expected with recent PE), however, right heart strain is still present. Last dose of Xarelto per patient was 3/3.  Patient to  be transitioned back to xarelto this morning. Given possibility of new clot will restart loading dose of xarelto for 3 weeks and then transition back to maintenance therapy. Will stop argatroban at time of xarelto administration.  Goal of Therapy:  Monitor platelets by anticoagulation protocol: Yes   Plan:  -D/c argatroban at noon then give xarelto 15mg  bid x21d then transition to 20mg  daily  -Check CBC in am  Sheppard CoilFrank Wilson PharmD., BCPS Clinical Pharmacist Pager [phone] 11/02/2014 10:49 AM

## 2014-11-02 NOTE — Discharge Instructions (Signed)
Information on my medicine - XARELTO (rivaroxaban)  This medication education was reviewed with me or my healthcare representative as part of my discharge preparation.  The pharmacist that spoke with me during my hospital stay was:  Severiano GilbertWilson, Gilma Bessette Rhea, [facility]  WHY WAS Carlena HurlXARELTO PRESCRIBED FOR YOU? Xarelto was prescribed to treat blood clots that may have been found in the veins of your legs (deep vein thrombosis) or in your lungs (pulmonary embolism) and to reduce the risk of them occurring again.  What do you need to know about Xarelto? The starting dose is one 15 mg tablet taken TWICE daily with food for the FIRST 21 DAYS then on 11/23/14 the dose is changed to one 20 mg tablet taken ONCE A DAY with your evening meal.  DO NOT stop taking Xarelto without talking to the health care provider who prescribed the medication.  Refill your prescription for 20 mg tablets before you run out.  After discharge, you should have regular check-up appointments with your healthcare provider that is prescribing your Xarelto.  In the future your dose may need to be changed if your kidney function changes by a significant amount.  What do you do if you miss a dose? If you are taking Xarelto TWICE DAILY and you miss a dose, take it as soon as you remember. You may take two 15 mg tablets (total 30 mg) at the same time then resume your regularly scheduled 15 mg twice daily the next day.  If you are taking Xarelto ONCE DAILY and you miss a dose, take it as soon as you remember on the same day then continue your regularly scheduled once daily regimen the next day. Do not take two doses of Xarelto at the same time.   Important Safety Information Xarelto is a blood thinner medicine that can cause bleeding. You should call your healthcare provider right away if you experience any of the following: ? Bleeding from an injury or your nose that does not stop. ? Unusual colored urine (red or dark brown) or unusual  colored stools (red or black). ? Unusual bruising for unknown reasons. ? A serious fall or if you hit your head (even if there is no bleeding).  Some medicines may interact with Xarelto and might increase your risk of bleeding while on Xarelto. To help avoid this, consult your healthcare provider or pharmacist prior to using any new prescription or non-prescription medications, including herbals, vitamins, non-steroidal anti-inflammatory drugs (NSAIDs) and supplements.  This website has more information on Xarelto: [email or web address].xarelto.[email or web address].
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
## 2014-11-03 LAB — CBC
HCT: 39.2 % (ref 39.0–52.0)
HEMOGLOBIN: 13 g/dL (ref 13.0–17.0)
MCH: 27 pg (ref 26.0–34.0)
MCHC: 33.2 g/dL (ref 30.0–36.0)
MCV: 81.3 fL (ref 78.0–100.0)
Platelets: 282 10*3/uL (ref 150–400)
RBC: 4.82 MIL/uL (ref 4.22–5.81)
RDW: 13.5 % (ref 11.5–15.5)
WBC: 6.2 10*3/uL (ref 4.0–10.5)

## 2014-11-03 LAB — BASIC METABOLIC PANEL
ANION GAP: 7 (ref 5–15)
BUN: 7 mg/dL (ref 6–23)
CHLORIDE: 102 mmol/L (ref 96–112)
CO2: 27 mmol/L (ref 19–32)
Calcium: 8.7 mg/dL (ref 8.4–10.5)
Creatinine, Ser: 0.96 mg/dL (ref 0.50–1.35)
GLUCOSE: 169 mg/dL — AB (ref 70–99)
POTASSIUM: 3.7 mmol/L (ref 3.5–5.1)
SODIUM: 136 mmol/L (ref 135–145)

## 2014-11-03 LAB — GLUCOSE, CAPILLARY: GLUCOSE-CAPILLARY: 156 mg/dL — AB (ref 70–99)

## 2014-11-03 MED ORDER — RIVAROXABAN (XARELTO) VTE STARTER PACK (15 & 20 MG): ORAL_TABLET | ORAL | Status: DC

## 2014-11-03 MED ORDER — INSULIN GLARGINE 100 UNIT/ML SOLOSTAR PEN: 35.0000 [IU] | PEN_INJECTOR | Freq: Every day | SUBCUTANEOUS | Status: DC
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
## 2014-11-03 NOTE — Significant Event (Signed)
[employer]Called Insurance company @ [phone] for Xarelto pre-authorization. They will faxed the decision to [phone] which is 3 W. unit fax.  Clint LippsMutaz A Hardie Veltre Pager: [phone]: [phone] 11/03/2014, 2:58 PM

## 2014-11-03 NOTE — Care Management Note (Signed)
    Page 1 of 1   11/03/2014     11:38:59 AM CARE MANAGEMENT NOTE 11/03/2014  Patient:  Marcus Joseph,Marcus Joseph   Account Number:  [id number]  Date Initiated:  11/03/2014  Documentation initiated by:  GRAVES-BIGELOW,Yareth Kearse  Subjective/Objective Assessment:   Pt admitted for Pulmonary emboli. Plan for home on xarelto.     Action/Plan:   CM did call [facility] [address] and medication is available. Pt had already used his 30 day free card previous admission. Pt has copay card for zero co pay for year supply. MD aware of prior authorization.   Anticipated DC Date:  11/03/2014   Anticipated DC Plan:  HOME/SELF CARE      DC Planning Services  CM consult  Medication Assistance      Choice offered to / List presented to:             Status of service:  Completed, signed off Medicare Important Message given?  NO (If response is "NO", the following Medicare IM given date fields will be blank) Date Medicare IM given:   Medicare IM given by:   Date Additional Medicare IM given:   Additional Medicare IM given by:    Discharge Disposition:  HOME/SELF CARE  Per UR Regulation:  Reviewed for med. necessity/level of care/duration of stay  If discussed at Long Length of Stay Meetings, dates discussed:    Comments:  1111 11-03-14 Tomi BambergerBrenda Graves-Bigelow, RN,BSN [phone] Benefits check for xarelto: Called [employer] at [phone]. [facility]. Carlena HurlXARELTO is covered, but a PRIOR AUTHORIZATION is required. Prior Authorization phone line is [phone]. Retail Pharmacy co-payment will be between $30.00 and $60.00 (per [place] Vista[place] who could not determine the exact co-payment amount). Available at [employer] retail pharmacies such as [facility], [facility], [facility], etc.

## 2014-11-03 NOTE — Progress Notes (Signed)
   Name: Marcus Joseph MRN: [id number] DOB: 06-17-1971    ADMISSION DATE:  10/31/2014 CONSULTATION DATE:  11/01/14  REFERRING MD :  [place]/ [facility]  CHIEF COMPLAINT:  Short of breath, cough  BRIEF PATIENT DESCRIPTION: [age] yoM nonsmoker admitted for syncope. Hosp for saddle PE Feb, 2016 and reports compliant with Xarelto. Intermittent cough and tussive light-headedness gradually improving. Increased activity, got a pizza, then found down. CT with new or migrated clot. Pulmonary asked to assess.   SIGNIFICANT EVENTS / STUDIES  3/04  Admit with intermittent cough, near syncope with activity  3/04  CTA Chest >> bilateral mod-lg volume PE, decreased burden since 09/30/14 3/05  LE Venous Duplex >> indeterminate age DVT of posterior tibial vein RLE  3/05  ECHO >> EF 55-60%, RV poorly visualized   SUBJECTIVE:  Pt hopeful to go home.  No acute complaints.   VITAL SIGNS: Temp:  [98.4 F (36.9 C)-98.9 F (37.2 C)] 98.4 F (36.9 C) (03/07 0411) Pulse Rate:  [79-82] 79 (03/07 0411) Resp:  [18] 18 (03/07 0411) BP: (111-126)/(69-72) 111/72 mmHg (03/07 0411) SpO2:  [96 %-98 %] 98 % (03/07 0411) Weight:  [316 lb 3.2 oz (143.427 kg)] 316 lb 3.2 oz (143.427 kg) (03/07 0411)  PHYSICAL EXAMINATION: General: Pleasant, cooperative obese, sitting in bed Neuro:  Fully oriented, MAE  HEENT:  Speach clear, no stridor Cardiovascular:  RRR, no mgr, no JVD, P2 not incr Lungs:  Even/non-labored, distant BS due to obesity, clear Abdomen: obese, soft, bsx4 active Musculoskeletal:  Normal bulk Skin:  tatoo L arm, no rash   Recent Labs Lab 10/31/14 2020 11/01/14 0935 11/03/14 0536  NA 130* 136 136  K 3.4* 3.6 3.7  CL 103 100 102  CO2 24 30 27   BUN 11 7 7   CREATININE 0.85 1.00 0.96  GLUCOSE 245* 216* 169*    Recent Labs Lab 11/01/14 0935 11/02/14 0401 11/03/14 0536  HGB 13.9 13.1 13.0  HCT 41.5 39.0 39.2  WBC 5.9 6.8 6.2  PLT 337 287 282   No results found.  ASSESSMENT /  PLAN:   Pulmonary Embolism - suspect migration of previous clot vs new embolism from leg. Doppler leg veins improved. I don't think this was Xarelto failure.  Plan: -Continue xarelto per pharmacy  -Tessalon for cough -Would recommend a minimum of 6 months to one year of anticoagulation  -Likely a provoked PE by immobility after knee surgery but would assess hypercoagulable panel once off anticoagulation to ensure no other etiology of PE/DVT -[facility]   PCCM will sign off, please call if we can be of further assistance.     Canary BrimBrandi Ollis, NP-C [facility] Pgr: [phone] or [phone]  Appears well.  Breathing comfortably.  Continue xarelto for anti-coag, tessalon pearl as ordered, f/u to be arranged by primary.  PCCM will sign off, please call back if needed.  Patient seen and examined, agree with above note.  I dictated the care and orders written for this patient under my direction.  Alyson ReedyWesam G Chequita Mofield, MD [phone]  11/03/2014, 10:24 AM

## 2014-11-04 ENCOUNTER — Encounter: Payer: BLUE CROSS/BLUE SHIELD | Attending: *Deleted | Admitting: *Deleted

## 2014-11-04 ENCOUNTER — Encounter: Payer: Self-pay | Admitting: *Deleted

## 2014-11-04 VITALS — Ht 72.0 in | Wt 321.1 lb

## 2014-11-04 DIAGNOSIS — Z713 Dietary counseling and surveillance: Secondary | ICD-10-CM | POA: Diagnosis not present

## 2014-11-04 DIAGNOSIS — E1165 Type 2 diabetes mellitus with hyperglycemia: Secondary | ICD-10-CM | POA: Diagnosis not present

## 2014-11-04 DIAGNOSIS — IMO0002 Reserved for concepts with insufficient information to code with codable children: Secondary | ICD-10-CM

## 2014-11-04 DIAGNOSIS — Z794 Long term (current) use of insulin: Secondary | ICD-10-CM | POA: Insufficient documentation

## 2014-11-04 NOTE — Patient Instructions (Addendum)
Plan:  Aim for 3 Carb Choices per meal (45 grams) +/- 1 either way  Aim for 0-15 Carbs per snack if hungry  Include protein in moderation with your meals and snacks Consider reading food labels for Total Carbohydrate and Fat Grams of foods Consider  increasing your activity level by walking for 30 minutes daily as tolerated (HOLD UNTIL RELEASED BY PHYSICIAN) Consider checking BG at alternate times per day as directed by MD  Continue taking medication as directed by MD  Test glucose fasting then 2 hours after the first bite of breakfast Same with lunch and dinner  Take Novolog approx 15 minutes prior to each meal  When you go in for your next A1c... Ask about your cholesterol  Reduce milk to at least 2% or less

## 2014-11-06 ENCOUNTER — Encounter: Payer: Self-pay | Admitting: *Deleted

## 2014-11-06 NOTE — Progress Notes (Signed)
Diabetes Self-Management Education  Visit Type:  Initial DSME  Appt. Start Time: 1100 Appt. End Time: 1230  11/06/2014  [name], identified by name and date of birth, is a 44 y.o. male with a diagnosis of Diabetes: Type 2.  Other people present during visit:  [name] has recently has a TKA. He has been hospitalized post operatively for pulmonary embolism. There is question as to the effects of the before stated medical events and the onset of his T2DM. In September of 2015 his A1c was 6.5%. February 2016 it was 12%. His activity is presently restricted to said events.  ASSESSMENT  Height 6' (1.829 m), weight 321 lb 1.6 oz (145.65 kg). Body mass index is 43.54 kg/(m^2).  Initial Visit Information:  Are you currently following a meal plan?: No Are you taking your medications as prescribed?: Yes How often do you need to have someone help you when you read instructions, pamphlets, or other written materials from your doctor or pharmacy?: 1 - Never   Psychosocial:   Patient Belief/Attitude about Diabetes: Motivated to manage diabetes Self-care barriers: None Self-management support: Doctor's office, CDE visits Other persons present: Patient Patient Concerns: Nutrition/Meal planning, Glycemic Control, Weight Control Special Needs: None Preferred Learning Style: No preference indicated Learning Readiness: Change in progress  Complications:   Last HgB A1C per patient/outside source: 12 mg/dL How often do you check your blood sugar?: > 4 times/day (pre & post each meal) Fasting Blood glucose range (mg/dL): 161-096 Postprandial Blood glucose range (mg/dL): >045 Have you had a dilated eye exam in the past 12 months?: No Have you had a dental exam in the past 12 months?: No  Exercise:  Exercise: ADL's (limited to Pulmonary embolism)  Individualized Plan for Diabetes Self-Management Training:   Learning Objective:  Patient will have a greater understanding of  diabetes self-management. Patient education plan per assessed needs and concerns is to attend individual sessions      Education Topics Reviewed with Patient Today:  Definition of diabetes, type 1 and 2, and the diagnosis of diabetes, Factors that contribute to the development of diabetes Role of diet in the treatment of diabetes and the relationship between the three main macronutrients and blood glucose level, Food label reading, portion sizes and measuring food., Carbohydrate counting, Information on hints to eating out and maintain blood glucose control., Meal options for control of blood glucose level and chronic complications. Role of exercise on diabetes management, blood pressure control and cardiac health. (Patient on activity restriction due to recent surgery and pulmonary embolism) Reviewed patients medication for diabetes, action, purpose, timing of dose and side effects. Purpose and frequency of SMBG., Daily foot exams, Yearly dilated eye exam, Identified appropriate SMBG and/or A1C goals. Taught treatment of hypoglycemia - the 15 rule. Relationship between chronic complications and blood glucose control, Dental care Role of stress on diabetes  PATIENTS GOALS/Plan (Developed by the patient):  Nutrition: General guidelines for healthy choices and portions discussed Physical Activity: Not Applicable Medications: take my medication as prescribed Monitoring : test my blood glucose as discussed (note x per day with comment) Reducing Risk: do foot checks daily  Patient Instructions  Plan:  Aim for 3 Carb Choices per meal (45 grams) +/- 1 either way  Aim for 0-15 Carbs per snack if hungry  Include protein in moderation with your meals and snacks Consider reading food labels for Total Carbohydrate and Fat Grams of foods Consider  increasing your activity level by walking for 30  minutes daily as tolerated (HOLD UNTIL RELEASED BY PHYSICIAN) Consider checking BG at alternate times per  day as directed by MD  Continue taking medication as directed by MD  Test glucose fasting then 2 hours after the first bite of breakfast Same with lunch and dinner  Take Novolog approx 15 minutes prior to each meal  When you go in for your next A1c... Ask about your cholesterol  Reduce milk to at least 2% or less   Expected Outcomes:  Demonstrated interest in learning. Expect positive outcomes  Education material provided: Living Well with Diabetes, A1C conversion sheet, Meal plan card, My Plate and Snack sheet  If problems or questions, patient to contact team via:  Phone  Future DSME appointment: PRN            1

## 2014-11-12 ENCOUNTER — Institutional Professional Consult (permissible substitution): Payer: BLUE CROSS/BLUE SHIELD | Admitting: Internal Medicine

## 2014-12-02 ENCOUNTER — Ambulatory Visit (INDEPENDENT_AMBULATORY_CARE_PROVIDER_SITE_OTHER): Payer: BLUE CROSS/BLUE SHIELD | Admitting: Internal Medicine

## 2014-12-02 ENCOUNTER — Encounter: Payer: Self-pay | Admitting: Internal Medicine

## 2014-12-02 VITALS — BP 112/72 | HR 72 | Ht 72.0 in | Wt 332.0 lb

## 2014-12-02 DIAGNOSIS — I2699 Other pulmonary embolism without acute cor pulmonale: Secondary | ICD-10-CM | POA: Diagnosis not present

## 2014-12-02 DIAGNOSIS — R05 Cough: Secondary | ICD-10-CM

## 2014-12-02 DIAGNOSIS — R058 Other specified cough: Secondary | ICD-10-CM

## 2014-12-02 MED ORDER — PANTOPRAZOLE SODIUM 40 MG PO TBEC: 40.0000 mg | DELAYED_RELEASE_TABLET | Freq: Every day | ORAL | Status: DC

## 2014-12-02 MED ORDER — TRAMADOL HCL 50 MG PO TABS: ORAL_TABLET | ORAL | Status: DC

## 2014-12-02 MED ORDER — FAMOTIDINE 20 MG PO TABS: ORAL_TABLET | ORAL | Status: DC

## 2014-12-02 NOTE — Progress Notes (Signed)
Subjective:    Patient ID: Marcus Joseph, male    DOB: Oct 27, 1970,     MRN: [id number]  HPI  33 yobm morbidly obese never smoked with tendency to bad "bronchitis" x since around 2008 mostly in springtime assoc with rhinitis requiring otc's plus inhaler prn in past but a new pattern in Dec 2015 with gradually worse sob/ cough  with occ green but never bloody then admit with pe/R dvt p L knee surgery 05/2014   Admit date: 09/30/2014 Discharge date: 10/04/2014   Discharge Diagnoses:  Pulmonary Embolus with RV strain -d/c Lovenox  and start Xarelto -will likely go home with Eliquis or Xarelto -await Echo--EF 55-60 percent, grade 1 diastolic dysfunction, mild reduced RV function -wean oxygen for sat >92% - 10/03/2014-patient is clinically stable on room air with oxygen saturation 98-100 percent - 10/03/2014-- ambulation did not reveal any oxygen desaturation-- remained on 98-100 percent on room air DVT-RLE -duplex with DVT of R-popliteal, distal femoral and post tibial veins Diabetes Mellitus, uncontrolled -Newly diagnosed during this admission -Increase Lantus 30 units at bedtime -NovoLog sliding scale -Await hemoglobin A1c--12.0 -Diabetic education provided during the admission by diabetic educators - NovoLog 5 units with meals hematuria -pt had intermitten hematuria during the hospitalization -review of medical records reveals he has had hematuria back to 2012 - check UA-showed microscopic hematuria -Consulted Dr. Kalman Shan pt--did not feel pt needed any urgent intervention, but will need out CT abd/pelvis and cystoscopy  -Hgb remained stable Elevated troponin -Secondary to pulmonary embolus  -no chest pain after first day of admission Morbid obesity -BMI 44.2 -nutrition eval Pruritis -no rash -symptomatic tx - improved -no signs of angioedema or anaphylaxis  Admit date: 10/31/2014 Discharge date: 11/03/2014   Discharge Diagnoses:  Principal Problem:  Syncope,  tussive Active Problems:  Pulmonary emboli  Diabetes mellitus type 2, uncontrolled  Faintness   Discharge Condition: Stable  Diet recommendation: Healthy  Filed Weights   11/01/14 0154 11/02/14 0443 11/03/14 0411  Weight: 146.2 kg (322 lb 5 oz) 144.244 kg (318 lb) 143.427 kg (316 lb 3.2 oz)    History of present illness:  Marcus Joseph is a 44 y.o. male with history of recently diagnosed pulmonary embolism and right lower extremity DVT, recently diagnosed diabetes mellitus type 2 was brought to the ER the patient had an episode of loss of consciousness. Patient states that since discharge patient has been having shortness of breath and off-and-on dizziness. Patient also has been having chronic cough. Last evening patient has been having a lot of cough and patient was talking to his girlfriend stating that he was not feeling well. Pain is girlfriend came home he was found on the flow. Patient did not have any incontinence of urine or tongue bite. Patient did not have any confused state. Patient stated after talking to his girlfriend he was about to go and pick the pizza from his car when he might have lost his consciousness. The exact duration is not known. Patient was brought to the ER by patient's girlfriend. Patient was nonfocal. CT head was negative. CT chest was showing possible new pulmonary embolism. EKG shows normal sinus rhythm. Patient was not orthostatic. Denies any chest pain. Patient has been admitted for further management.  Hospital Course:   Acute recurrent pulmonary embolism Patient was recently diagnosed in February 2 with saddle pulmonary embolus. Patient discharged home on Xarelto, comes back with syncopal episode. Unclear of syncopal episode secondary to PE versus post tussive syncope. CT angio  showed new clots, with decrease of the saddle PE burden.  Per pulmonology, it is very common for the saddle embolus to break down and cause peripheral small  emboli. Lower extremity Doppler showed resolving DVT, 2-D echo no mention of right heart strain. Started initially on argatroban, pulmonology evaluated the patient. Pulmonology recommended to place him back on Xarelto, discharged on 15 mg BID for 3 weeks, then 20 mg daily.        12/02/2014 1st [facility] office visit/ Bonnee Zertuche / on Xarelto since 09/30/14   Chief Complaint  Patient presents with  . Pulmonary Consult    Referred by Dr Lindaann PascalScott Long, PA. Pt c/o SOB since Dec 2015- was dxed with PE 09/30/14. He has cough that comes and goes- non prod.  He states that he gets out of breath going for long walks and talking for long periods of time.   doe x > HT / does ok across parking which represents gradual improvement since admit  Cough present since mid Dec 2015 clear mucus worse at hs but all night as well, mostly dry  Has coughed so hard he vomits with over HB Also coughed so hard felt like fainting  No obvious other patterns in day to day or daytime variabilty or assoc chronic cough or cp or chest tightness, subjective wheeze overt sinus  symptoms. No unusual exp hx or h/o childhood pna/ asthma or knowledge of premature birth.  Sleeping ok without nocturnal  or early am exacerbation  of respiratory  c/o's or need for noct saba. Also denies any obvious fluctuation of symptoms with weather or environmental changes or other aggravating or alleviating factors except as outlined above   Current Medications, Allergies, Complete Past Medical History, Past Surgical History, Family History, and Social History were reviewed in [facility] electronic medical record.               Review of Systems  Constitutional: Positive for appetite change. Negative for fever, chills, activity change and unexpected weight change.  HENT: Negative for congestion, dental problem, postnasal drip, rhinorrhea, sneezing, sore throat, trouble swallowing and voice change.   Eyes: Negative for visual disturbance.    Respiratory: Positive for cough and shortness of breath. Negative for choking.   Cardiovascular: Negative for chest pain and leg swelling.  Gastrointestinal: Negative for nausea, vomiting and abdominal pain.  Genitourinary: Negative for difficulty urinating.       Acid heartburn Indigestion  Musculoskeletal: Positive for arthralgias.  Skin: Negative for rash.  Psychiatric/Behavioral: Negative for behavioral problems and confusion.       Objective:   Physical Exam  amb bm nad   Wt Readings from Last 3 Encounters:  12/02/14 332 lb (150.594 kg)  11/04/14 321 lb 1.6 oz (145.65 kg)  11/03/14 316 lb 3.2 oz (143.427 kg)    Vital signs reviewed  HEENT: nl dentition, turbinates, and orophanx. Nl external ear canals without cough reflex   NECK :  without JVD/Nodes/TM/ nl carotid upstrokes bilaterally   LUNGS: no acc muscle use, clear to A and P bilaterally without cough on insp or exp maneuvers   CV:  RRR  no s3 or murmur or increase in P2, no edema   ABD:  soft and nontender with nl excursion in the supine position. No bruits or organomegaly, bowel sounds nl  MS:  warm without deformities, calf tenderness, cyanosis or clubbing  SKIN: warm and dry without lesions    NEURO:  alert, approp, no deficits  CTa 10/31/14 Bilateral moderate to large volume pulmonary emboli. Decreased embolic burden since 09/30/2014. Possibly recurrent, given morphology. Positive for acute PE with CT evidence of right heartstrain (RV/LV Ratio = 0.98) consistent with at least submassive (intermediate risk) PE        Assessment & Plan:

## 2014-12-02 NOTE — Patient Instructions (Signed)
Pantoprazole (protonix) 40 mg  Take 30-60 min before first meal of the day and Pepcid 20 mg one bedtime until return to office - this is the best way to tell whether stomach acid is contributing to your problem.    GERD (REFLUX)  is an extremely common cause of respiratory symptoms just like yours , many times with no obvious heartburn at all.    It can be treated with medication, but also with lifestyle changes including avoidance of late meals, excessive alcohol, smoking cessation, and avoid fatty foods, chocolate, peppermint, colas, red wine, and acidic juices such as orange juice.  NO MINT OR MENTHOL PRODUCTS SO NO COUGH DROPS  USE SUGARLESS CANDY INSTEAD (Jolley ranchers or Stover's or Life Savers) or even ice chips will also do - the key is to swallow to prevent all throat clearing. NO OIL BASED VITAMINS - use powdered substitutes.  mucinex dm 1200 mg every 12 hours for cough and if still coughing ok to take Tramadol 50 mg one every 4 hours as needed   Please schedule a follow up office visit in 4 weeks, sooner if needed

## 2014-12-03 ENCOUNTER — Encounter: Payer: Self-pay | Admitting: Internal Medicine

## 2014-12-03 DIAGNOSIS — R058 Other specified cough: Secondary | ICD-10-CM | POA: Insufficient documentation

## 2014-12-03 DIAGNOSIS — R05 Cough: Secondary | ICD-10-CM | POA: Insufficient documentation

## 2014-12-03 NOTE — Assessment & Plan Note (Signed)
The most common causes of chronic cough in immunocompetent adults include the following: upper airway cough syndrome (UACS), previously referred to as postnasal drip syndrome (PNDS), which is caused by variety of rhinosinus conditions; (2) asthma; (3) GERD; (4) chronic bronchitis from cigarette smoking or other inhaled environmental irritants; (5) nonasthmatic eosinophilic bronchitis; and (6) bronchiectasis.   These conditions, singly or in combination, have accounted for up to 94% of the causes of chronic cough in prospective studies.   Other conditions have constituted no >6% of the causes in prospective studies These have included bronchogenic carcinoma, chronic interstitial pneumonia, sarcoidosis, left ventricular failure, ACEI-induced cough, and aspiration from a condition associated with pharyngeal dysfunction.    Chronic cough is often simultaneously caused by more than one condition. A single cause has been found from 38 to 82% of the time, multiple causes from 18 to 62%. Multiply caused cough has been the result of three diseases up to 42% of the time.       Based on hx and exam, this is most likely:  Classic Upper airway cough syndrome, so named because it's frequently impossible to sort out how much is  CR/sinusitis with freq throat clearing (which can be related to primary GERD)   vs  causing  secondary (" extra esophageal")  GERD from wide swings in gastric pressure that occur with throat clearing, often  promoting self use of mint and menthol lozenges that reduce the lower esophageal sphincter tone and exacerbate the problem further in a cyclical fashion.   These are the same pts (now being labeled as having "irritable larynx syndrome" by some cough centers) who not infrequently have a history of having failed to tolerate ace inhibitors,  dry powder inhalers or biphosphonates or report having atypical reflux symptoms that don't respond to standard doses of PPI , and are easily confused as  having aecopd or asthma flares by even experienced allergists/ pulmonologists.   The first step is to maximize acid suppression and eliminate cyclical coughing then regroup in 4 weeks   See instructions for specific recommendations which were reviewed directly with the patient who was given a copy with highlighter outlining the key components.

## 2014-12-03 NOTE — Assessment & Plan Note (Signed)
-  Original dx 09/30/14 CTa with RV strain/ saddle position/ R DVT -Repeat CTa  10/31/14  Bilateral moderate to large volume pulmonary emboli. Decreased embolic burden since 09/30/2014. Possibly recurrent, given morphology. Positive for acute PE with CT evidence of right heartstrain (RV/LV Ratio = 0.98) consistent with at least submassive (intermediate risk)PE with improved Venous dopplers and ok RV on Echo    This case is troubling in that the explanation for the CTa 10/31/14 is that the previous clots in the R leg migrated but I'm not entirely comfortable with that as an explanation  And feel he is very high risk for recurrence and should probably see Dr Cyndie ChimeGranfortuna or one of the heme/onc docs to see if they support continuing NOAC as we don't have that much experience with NOAC's in the morbidly obese.  That having been said, he is clearly improving now on rx with no evidence of recurrence and the main concern is cough which I strongly doubt is related to recurrent/ persistent clot.

## 2014-12-30 ENCOUNTER — Ambulatory Visit: Payer: BLUE CROSS/BLUE SHIELD | Admitting: Internal Medicine

## 2015-02-03 DIAGNOSIS — Z86711 Personal history of pulmonary embolism: Secondary | ICD-10-CM | POA: Insufficient documentation

## 2015-02-03 DIAGNOSIS — E119 Type 2 diabetes mellitus without complications: Secondary | ICD-10-CM | POA: Insufficient documentation

## 2015-02-03 DIAGNOSIS — Z8679 Personal history of other diseases of the circulatory system: Secondary | ICD-10-CM | POA: Insufficient documentation

## 2015-02-03 DIAGNOSIS — K219 Gastro-esophageal reflux disease without esophagitis: Secondary | ICD-10-CM | POA: Insufficient documentation

## 2015-02-03 DIAGNOSIS — Z79899 Other long term (current) drug therapy: Secondary | ICD-10-CM | POA: Insufficient documentation

## 2015-02-03 DIAGNOSIS — M1990 Unspecified osteoarthritis, unspecified site: Secondary | ICD-10-CM | POA: Insufficient documentation

## 2015-02-03 DIAGNOSIS — R0600 Dyspnea, unspecified: Secondary | ICD-10-CM | POA: Insufficient documentation

## 2015-02-03 DIAGNOSIS — Z794 Long term (current) use of insulin: Secondary | ICD-10-CM | POA: Insufficient documentation

## 2015-02-03 DIAGNOSIS — Z862 Personal history of diseases of the blood and blood-forming organs and certain disorders involving the immune mechanism: Secondary | ICD-10-CM | POA: Insufficient documentation

## 2015-02-03 DIAGNOSIS — Z7901 Long term (current) use of anticoagulants: Secondary | ICD-10-CM | POA: Insufficient documentation

## 2015-02-04 ENCOUNTER — Encounter (HOSPITAL_COMMUNITY): Payer: Self-pay | Admitting: Emergency Medicine

## 2015-02-04 ENCOUNTER — Emergency Department (HOSPITAL_COMMUNITY): Payer: BLUE CROSS/BLUE SHIELD

## 2015-02-04 ENCOUNTER — Emergency Department (HOSPITAL_COMMUNITY)
Admission: EM | Admit: 2015-02-04 | Discharge: 2015-02-04 | Disposition: A | Discharge status: Home / Self Care | Payer: BLUE CROSS/BLUE SHIELD | Attending: Emergency Medicine | Admitting: Emergency Medicine

## 2015-02-04 DIAGNOSIS — R0600 Dyspnea, unspecified: Secondary | ICD-10-CM

## 2015-02-04 LAB — BASIC METABOLIC PANEL
ANION GAP: 11 (ref 5–15)
BUN: 7 mg/dL (ref 6–20)
CALCIUM: 8.9 mg/dL (ref 8.9–10.3)
CO2: 25 mmol/L (ref 22–32)
Chloride: 101 mmol/L (ref 101–111)
Creatinine, Ser: 0.95 mg/dL (ref 0.61–1.24)
GFR calc non Af Amer: >60 mL/min (ref >60)
GLUCOSE: 275 mg/dL — AB (ref 65–99)
Potassium: 3.9 mmol/L (ref 3.5–5.1)
Sodium: 137 mmol/L (ref 135–145)

## 2015-02-04 LAB — BRAIN NATRIURETIC PEPTIDE: B NATRIURETIC PEPTIDE 5: 20.5 pg/mL (ref 0.0–100.0)

## 2015-02-04 LAB — CBC
HEMATOCRIT: 41.5 % (ref 39.0–52.0)
Hemoglobin: 14 g/dL (ref 13.0–17.0)
MCH: 27.7 pg (ref 26.0–34.0)
MCHC: 33.7 g/dL (ref 30.0–36.0)
MCV: 82 fL (ref 78.0–100.0)
Platelets: 340 10*3/uL (ref 150–400)
RBC: 5.06 MIL/uL (ref 4.22–5.81)
RDW: 13.6 % (ref 11.5–15.5)
WBC: 7.5 10*3/uL (ref 4.0–10.5)

## 2015-02-04 LAB — I-STAT TROPONIN, ED: Troponin i, poc: 0 ng/mL (ref 0.00–0.08)

## 2015-02-04 MED ORDER — IOHEXOL 350 MG/ML SOLN
100.0000 mL | Freq: Once | INTRAVENOUS | Status: AC | PRN
  Administered 2015-02-04: 80 mL via INTRAVENOUS
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
## 2015-02-04 NOTE — ED Notes (Signed)
Pt. reports chronic SOB since Dec. last year requesting evaluation to rule out PE , pt. stated he ran out of Xarelto for 2 weeks.

## 2015-02-04 NOTE — Discharge Instructions (Signed)

## 2015-02-23 ENCOUNTER — Other Ambulatory Visit: Payer: Self-pay

## 2015-05-31 ENCOUNTER — Encounter (HOSPITAL_BASED_OUTPATIENT_CLINIC_OR_DEPARTMENT_OTHER): Payer: Self-pay

## 2015-05-31 ENCOUNTER — Emergency Department (HOSPITAL_BASED_OUTPATIENT_CLINIC_OR_DEPARTMENT_OTHER)
Admission: EM | Admit: 2015-05-31 | Discharge: 2015-05-31 | Disposition: A | Discharge status: Home / Self Care | Payer: BLUE CROSS/BLUE SHIELD | Attending: Emergency Medicine | Admitting: Emergency Medicine

## 2015-05-31 DIAGNOSIS — K219 Gastro-esophageal reflux disease without esophagitis: Secondary | ICD-10-CM | POA: Insufficient documentation

## 2015-05-31 DIAGNOSIS — Z79899 Other long term (current) drug therapy: Secondary | ICD-10-CM | POA: Insufficient documentation

## 2015-05-31 DIAGNOSIS — E119 Type 2 diabetes mellitus without complications: Secondary | ICD-10-CM | POA: Insufficient documentation

## 2015-05-31 DIAGNOSIS — R319 Hematuria, unspecified: Secondary | ICD-10-CM | POA: Insufficient documentation

## 2015-05-31 DIAGNOSIS — Z86711 Personal history of pulmonary embolism: Secondary | ICD-10-CM | POA: Insufficient documentation

## 2015-05-31 DIAGNOSIS — M1990 Unspecified osteoarthritis, unspecified site: Secondary | ICD-10-CM | POA: Insufficient documentation

## 2015-05-31 DIAGNOSIS — Z8679 Personal history of other diseases of the circulatory system: Secondary | ICD-10-CM | POA: Insufficient documentation

## 2015-05-31 DIAGNOSIS — Z862 Personal history of diseases of the blood and blood-forming organs and certain disorders involving the immune mechanism: Secondary | ICD-10-CM | POA: Insufficient documentation

## 2015-05-31 DIAGNOSIS — Z794 Long term (current) use of insulin: Secondary | ICD-10-CM | POA: Insufficient documentation

## 2015-05-31 LAB — URINALYSIS, ROUTINE W REFLEX MICROSCOPIC
Bilirubin Urine: NEGATIVE
Glucose, UA: >1000 mg/dL — AB
KETONES UR: NEGATIVE mg/dL
LEUKOCYTES UA: NEGATIVE
NITRITE: NEGATIVE
PROTEIN: NEGATIVE mg/dL
Specific Gravity, Urine: 1.041 — ABNORMAL HIGH (ref 1.005–1.030)
UROBILINOGEN UA: 0.2 mg/dL (ref 0.0–1.0)
pH: 5.5 (ref 5.0–8.0)

## 2015-05-31 LAB — CBG MONITORING, ED: Glucose-Capillary: 315 mg/dL — ABNORMAL HIGH (ref 65–99)

## 2015-05-31 LAB — URINE MICROSCOPIC-ADD ON
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
## 2015-05-31 NOTE — ED Notes (Signed)
Here for hematuria (dark with clots), h/o same, seen by [facility] for the same, also reports some twinge of pain with urination at tip of penis (denies: abd, back, testical  or rectal pain, fever, nvd, change in stream, or unsual, new or change in sob, dizziness or nausea or other sx), takes xarelto and insulin. States, "[facility] used a cystoscope and said everything looked fine and to see them in a year". intermitant dysuria.

## 2015-05-31 NOTE — ED Notes (Signed)
Pt reports he passed a blood clot tonight while urinating - pt states he is concerned because he is on Xarelto - Pt does have 'blood and blood clot' with him. Pt is dyspneic with exertion but states "im always like that, that's not why I'm here. I know why I'm short of breath, I've had a saddle PE with scarring."

## 2015-05-31 NOTE — Discharge Instructions (Signed)

## 2015-06-07 ENCOUNTER — Emergency Department (HOSPITAL_BASED_OUTPATIENT_CLINIC_OR_DEPARTMENT_OTHER): Payer: BLUE CROSS/BLUE SHIELD

## 2015-06-07 ENCOUNTER — Emergency Department (HOSPITAL_BASED_OUTPATIENT_CLINIC_OR_DEPARTMENT_OTHER)
Admission: EM | Admit: 2015-06-07 | Discharge: 2015-06-07 | Disposition: A | Discharge status: Home / Self Care | Payer: BLUE CROSS/BLUE SHIELD | Attending: Emergency Medicine | Admitting: Emergency Medicine

## 2015-06-07 ENCOUNTER — Encounter (HOSPITAL_BASED_OUTPATIENT_CLINIC_OR_DEPARTMENT_OTHER): Payer: Self-pay | Admitting: Emergency Medicine

## 2015-06-07 DIAGNOSIS — M1990 Unspecified osteoarthritis, unspecified site: Secondary | ICD-10-CM | POA: Insufficient documentation

## 2015-06-07 DIAGNOSIS — Z79899 Other long term (current) drug therapy: Secondary | ICD-10-CM | POA: Insufficient documentation

## 2015-06-07 DIAGNOSIS — E119 Type 2 diabetes mellitus without complications: Secondary | ICD-10-CM | POA: Insufficient documentation

## 2015-06-07 DIAGNOSIS — Z86711 Personal history of pulmonary embolism: Secondary | ICD-10-CM | POA: Insufficient documentation

## 2015-06-07 DIAGNOSIS — K219 Gastro-esophageal reflux disease without esophagitis: Secondary | ICD-10-CM | POA: Insufficient documentation

## 2015-06-07 DIAGNOSIS — R0602 Shortness of breath: Secondary | ICD-10-CM | POA: Insufficient documentation

## 2015-06-07 DIAGNOSIS — R51 Headache: Secondary | ICD-10-CM | POA: Insufficient documentation

## 2015-06-07 DIAGNOSIS — Z862 Personal history of diseases of the blood and blood-forming organs and certain disorders involving the immune mechanism: Secondary | ICD-10-CM | POA: Insufficient documentation

## 2015-06-07 DIAGNOSIS — R079 Chest pain, unspecified: Secondary | ICD-10-CM | POA: Insufficient documentation

## 2015-06-07 DIAGNOSIS — Z7901 Long term (current) use of anticoagulants: Secondary | ICD-10-CM | POA: Insufficient documentation

## 2015-06-07 DIAGNOSIS — R112 Nausea with vomiting, unspecified: Secondary | ICD-10-CM | POA: Insufficient documentation

## 2015-06-07 DIAGNOSIS — Z8679 Personal history of other diseases of the circulatory system: Secondary | ICD-10-CM | POA: Insufficient documentation

## 2015-06-07 DIAGNOSIS — Z794 Long term (current) use of insulin: Secondary | ICD-10-CM | POA: Insufficient documentation

## 2015-06-07 DIAGNOSIS — R42 Dizziness and giddiness: Secondary | ICD-10-CM | POA: Insufficient documentation

## 2015-06-07 HISTORY — DX: Morbid (severe) obesity due to excess calories: E66.01

## 2015-06-07 LAB — CBC WITH DIFFERENTIAL/PLATELET
Basophils Absolute: 0 10*3/uL (ref 0.0–0.1)
Basophils Relative: 0 %
Eosinophils Absolute: 0.1 10*3/uL (ref 0.0–0.7)
Eosinophils Relative: 1 %
HCT: 45.6 % (ref 39.0–52.0)
Hemoglobin: 15.7 g/dL (ref 13.0–17.0)
Lymphocytes Relative: 51 %
Lymphs Abs: 3.5 10*3/uL (ref 0.7–4.0)
MCH: 28.2 pg (ref 26.0–34.0)
MCHC: 34.4 g/dL (ref 30.0–36.0)
MCV: 82 fL (ref 78.0–100.0)
Monocytes Absolute: 0.6 10*3/uL (ref 0.1–1.0)
Monocytes Relative: 9 %
Neutro Abs: 2.6 10*3/uL (ref 1.7–7.7)
Neutrophils Relative %: 39 %
Platelets: 361 10*3/uL (ref 150–400)
RBC: 5.56 MIL/uL (ref 4.22–5.81)
RDW: 13.1 % (ref 11.5–15.5)
WBC: 6.8 10*3/uL (ref 4.0–10.5)

## 2015-06-07 LAB — BASIC METABOLIC PANEL
Anion gap: 9 (ref 5–15)
BUN: 12 mg/dL (ref 6–20)
CO2: 25 mmol/L (ref 22–32)
Calcium: 8.8 mg/dL — ABNORMAL LOW (ref 8.9–10.3)
Chloride: 97 mmol/L — ABNORMAL LOW (ref 101–111)
Creatinine, Ser: 1.1 mg/dL (ref 0.61–1.24)
GFR calc Af Amer: >60 mL/min (ref >60)
GFR calc non Af Amer: >60 mL/min (ref >60)
Glucose, Bld: 458 mg/dL — ABNORMAL HIGH (ref 65–99)
Potassium: 4.1 mmol/L (ref 3.5–5.1)
Sodium: 131 mmol/L — ABNORMAL LOW (ref 135–145)

## 2015-06-07 LAB — TROPONIN I
Troponin I: <0.03 ng/mL (ref <0.031)
Troponin I: <0.03 ng/mL (ref <0.031)

## 2015-06-07 LAB — CBG MONITORING, ED: Glucose-Capillary: 231 mg/dL — ABNORMAL HIGH (ref 65–99)

## 2015-06-07 LAB — BRAIN NATRIURETIC PEPTIDE: B Natriuretic Peptide: 14.5 pg/mL (ref 0.0–100.0)

## 2015-06-07 MED ORDER — MORPHINE SULFATE (PF) 4 MG/ML IV SOLN
6.0000 mg | Freq: Once | INTRAVENOUS | Status: AC
  Administered 2015-06-07: 6 mg via INTRAVENOUS
  Filled 2015-06-07: qty 2

## 2015-06-07 MED ORDER — RIVAROXABAN 15 MG PO TABS
15.0000 mg | ORAL_TABLET | Freq: Once | ORAL | Status: AC
  Administered 2015-06-07: 15 mg via ORAL
  Filled 2015-06-07: qty 1

## 2015-06-07 MED ORDER — SODIUM CHLORIDE 0.9 % IV SOLN
Freq: Once | INTRAVENOUS | Status: AC
Start: 2015-06-07 — End: 2015-06-07
  Administered 2015-06-07: 15:00:00 via INTRAVENOUS

## 2015-06-07 MED ORDER — DIPHENHYDRAMINE HCL 50 MG/ML IJ SOLN
50.0000 mg | Freq: Once | INTRAMUSCULAR | Status: AC
  Administered 2015-06-07: 50 mg via INTRAVENOUS
  Filled 2015-06-07: qty 1

## 2015-06-07 MED ORDER — RIVAROXABAN 20 MG PO TABS: 20.0000 mg | ORAL_TABLET | Freq: Every day | ORAL | Status: DC

## 2015-06-07 MED ORDER — INSULIN REGULAR HUMAN 100 UNIT/ML IJ SOLN
10.0000 [IU] | Freq: Once | INTRAMUSCULAR | Status: AC
  Administered 2015-06-07: 10 [IU] via INTRAVENOUS
  Filled 2015-06-07: qty 1

## 2015-06-07 MED ORDER — IOHEXOL 350 MG/ML SOLN
100.0000 mL | Freq: Once | INTRAVENOUS | Status: AC | PRN
  Administered 2015-06-07: 100 mL via INTRAVENOUS

## 2015-06-07 MED ORDER — SODIUM CHLORIDE 0.9 % IV BOLUS (SEPSIS)
1000.0000 mL | Freq: Once | INTRAVENOUS | Status: AC
  Administered 2015-06-07: 1000 mL via INTRAVENOUS

## 2015-06-07 MED ORDER — METHYLPREDNISOLONE SODIUM SUCC 125 MG IJ SOLR
125.0000 mg | Freq: Once | INTRAMUSCULAR | Status: AC
  Administered 2015-06-07: 125 mg via INTRAVENOUS
  Filled 2015-06-07: qty 2

## 2015-06-07 NOTE — ED Notes (Signed)
Resting quietly on stretcher, cont on cardiac monitor with NSR noted w/o ectopy, remains on RA with POX at 95-98%, speech appears WNL. Skin W&D, wife at side

## 2015-06-07 NOTE — ED Notes (Signed)
Pt c/o dull chest pain with occasion sharp pains. Pt also c/o shob which has been ongoing since feb after dx of PE.

## 2015-06-07 NOTE — ED Notes (Signed)
Family at bedside. 

## 2015-06-07 NOTE — ED Notes (Signed)
Patient transported to X-ray via stretcher, sr x 2 up 

## 2015-06-07 NOTE — ED Notes (Signed)
Spoke with EDP to inform of pts arrival and status

## 2015-06-07 NOTE — ED Notes (Signed)
Blood drawn to lab for hold

## 2015-06-07 NOTE — ED Notes (Signed)
Returns back from radiology for CXR

## 2015-06-07 NOTE — ED Notes (Signed)
Placed on cont cardiac monitor with cont POX with int NBP

## 2015-06-07 NOTE — ED Notes (Signed)
MD at bedside. 

## 2015-06-07 NOTE — ED Notes (Signed)
Pt positioned for comfort.

## 2015-06-07 NOTE — Discharge Instructions (Signed)
Nonspecific Chest Pain °It is often hard to find the cause of chest pain. There is always a chance that your pain could be related to something serious, such as a heart attack or a blood clot in your lungs. Chest pain can also be caused by conditions that are not life-threatening. If you have chest pain, it is very important to follow up with your doctor. ° °HOME CARE °· If you were prescribed an antibiotic medicine, finish it all even if you start to feel better. °· Avoid any activities that cause chest pain. °· Do not use any tobacco products, including cigarettes, chewing tobacco, or electronic cigarettes. If you need help quitting, ask your doctor. °· Do not drink alcohol. °· Take medicines only as told by your doctor. °· Keep all follow-up visits as told by your doctor. This is important. This includes any further testing if your chest pain does not go away. °· Your doctor may tell you to keep your head raised (elevated) while you sleep. °· Make lifestyle changes as told by your doctor. These may include: °¨ Getting regular exercise. Ask your doctor to suggest some activities that are safe for you. °¨ Eating a heart-healthy diet. Your doctor or a diet specialist (dietitian) can help you to learn healthy eating options. °¨ Maintaining a healthy weight. °¨ Managing diabetes, if necessary. °¨ Reducing stress. °GET HELP IF: °· Your chest pain does not go away, even after treatment. °· You have a rash with blisters on your chest. °· You have a fever. °GET HELP RIGHT AWAY IF: °· Your chest pain is worse. °· You have an increasing cough, or you cough up blood. °· You have severe belly (abdominal) pain. °· You feel extremely weak. °· You pass out (faint). °· You have chills. °· You have sudden, unexplained chest discomfort. °· You have sudden, unexplained discomfort in your arms, back, neck, or jaw. °· You have shortness of breath at any time. °· You suddenly start to sweat, or your skin gets clammy. °· You feel  nauseous. °· You vomit. °· You suddenly feel light-headed or dizzy. °· Your heart begins to beat quickly, or it feels like it is skipping beats. °These symptoms may be an emergency. Do not wait to see if the symptoms will go away. Get medical help right away. Call your local emergency services (911 in the [place] Do not drive yourself to the hospital. °  °This information is not intended to replace advice given to you by your health care provider. Make sure you discuss any questions you have with your health care provider. °  °Document Released: 02/01/2008 Document Revised: 09/05/2014 Document Reviewed: 03/21/2014 °Elsevier Interactive Patient Education ©2016 Elsevier Inc. ° ° °Emergency Department Resource Guide °1) Find a Doctor and Pay Out of Pocket °Although you won't have to find out who is covered by your insurance plan, it is a good idea to ask around and get recommendations. You will then need to call the office and see if the doctor you have chosen will accept you as a new patient and what types of options they offer for patients who are self-pay. Some doctors offer discounts or will set up payment plans for their patients who do not have insurance, but you will need to ask so you aren't surprised when you get to your appointment. ° °2) Contact Your Local Health Department °Not all health departments have doctors that can see patients for sick visits, but many do, so it is worth a   call to see if yours does. If you don't know where your local health department is, you can check in your phone book. The CDC also has a tool to help you locate your state's health department, and many state websites also have listings of all of their local health departments. ° °3) Find a Walk-in Clinic °If your illness is not likely to be very severe or complicated, you may want to try a walk in clinic. These are popping up all over the country in pharmacies, drugstores, and shopping centers. They're usually staffed by nurse  practitioners or physician assistants that have been trained to treat common illnesses and complaints. They're usually fairly quick and inexpensive. However, if you have serious medical issues or chronic medical problems, these are probably not your best option. ° °No Primary Care Doctor: °- Call [facility] at  832-8000 - they can help you locate a primary care doctor that  accepts your insurance, provides certain services, etc. °- Physician Referral Service- 1-800-533-3463 ° °Chronic Pain Problems: °Organization         Address  Phone   Notes  °[facility]  (336) 297-2271 Patients need to be referred by their primary care doctor.  ° °Medication Assistance: °Organization         Address  Phone   Notes  °[place] [facility] [address] °[place], [place] [address] (336) 641-8030 --Must be a resident of [place] °-- Must have NO insurance coverage whatsoever (no Medicaid/ Medicare, etc.) °-- The pt. MUST have a primary care doctor that directs their care regularly and follows them in the community °  °[facility]  (866) 331-1348   °[employer]  (888) 892-1162   ° °Agencies that provide inexpensive medical care: °Organization         Address  Phone   Notes  °[facility]  (336) 832-8035   °[facility]    (336) 832-7272   °[facility] [address] °[place], [place] [address] (336) 832-4777   °[facility] [address], °[place] (336) 271-4999   °[facility]    (336) 373-0678   °[facility]    (336) 272-1050   °[facility] ° [address], [place] Phone:  (336) 832-4444, Fax:  (336) 832-4440 Hours of Operation:  9 am - 6 pm, M-F.  Also accepts Medicaid/Medicare and self-pay.  °[facility] ° [address], [place] Phone: (336) 832-3150, Fax: (336) 832-3151. Hours of Operation:  8:30 am -  5:30 pm, M-F.  Also accepts Medicaid and self-pay.  °[facility] [place] [address], [place] Phone: (336) 878-6027   °[facility] [address], [place], [place] (336)723-1848, Ext. 123 Mondays & Thursdays: 7-9 AM.  First 15 patients are seen on a first come, first serve basis. °  ° °Medicaid-accepting [place] Providers: ° °Organization         Address  Phone   Notes  °[facility] [address], [place] (336) 641-2100 Also accepts self-pay patients.  °[facility] [address], [place] ° (336) 856-9996   °[facility] [address], [place] (336) 288-8857   °[facility] [address] [place] [address], [place] (336) 299-7000   °Veita Bland [address],   [place]  ° (336) 373-1557 Only accepts [place] Access Medicaid patients after they have their name applied to their card.  ° °Self-Pay (no insurance) in [place]: ° °Organization         Address  Phone   Notes  °Sickle Cell Patients, [facility] [address], [place] (336) 832-1970   °[facility] [address], [place] (336) 832-4400   °[facility] [place] ° [address], [place] (336) 992-4800   °[facility]/Dr. Osei-Bonsu ° [address] [place] [address], [place] or [address], [place] (336) 841-8500 Phone number for both [place] and [place] locations is the same.  °[facility] [address], [place] (336) 299-0000   °[facility] [address] [place] [address], [place] or [address] (336) 852-7530 °(336) 878-2260   °[facility] [address], [place] (336) 350-1642, phone; (336) 294-5005, fax Sees patients 1st and 3rd Saturday of every month.  Must not qualify for public or private insurance (i.e. Medicaid, Medicare, [place] Health Choice,  Veterans' Benefits) • Household income should be no more than 200% of the poverty level •The clinic cannot treat you if you are pregnant or think you are pregnant • Sexually transmitted diseases are not treated at the clinic.  ° ° °Dental Care: °Organization         Address  Phone  Notes  °[facility] [address], [place] (336) 641-6152 Accepts children up to age 21 who are enrolled in Medicaid or [place] Health Choice; pregnant women with a Medicaid card; and children who have applied for Medicaid or [place] Health Choice, but were declined, whose parents can pay a reduced fee at time of service.  °[facility] [place]  [address], [place] (336) 641-7733 Accepts children up to age 21 who are enrolled in Medicaid or [place] Health Choice; pregnant women with a Medicaid card; and children who have applied for Medicaid or [place] Health Choice, but were declined, whose parents can pay a reduced fee at time of service.  °[facility] ° [address], [place] (336) 641-4533 Patients are seen by appointment only. Walk-ins are not accepted. [facility] will see patients 18 years of age and older. °Monday - Tuesday (8am-5pm) °Most Wednesdays (8:30-5pm) °$30 per visit, cash only  °[facility] ° [address], [place] (336) 641-4533 Patients are seen by appointment only. Walk-ins are not accepted. [facility] will see patients 18 years of age and older. °One Wednesday Evening (Monthly: Volunteer Based).  $30 per visit, cash only  °[facility]  (919) 537-3737 for adults; Children under age 4, call Graduate Pediatric Dentistry at (919) [phone]. Children aged 4-14, please call (919) [phone] to request a pediatric application. ° Dental services are provided in all areas of dental care including fillings, crowns and bridges, complete and  partial dentures, implants, gum treatment, root canals, and extractions. Preventive care is also provided. Treatment is provided to both adults and children. °Patients are selected via a lottery and there is often a waiting list. °  °[facility] [address], °[place] ° (336) 763-8833 [email or web address] °  °[facility] [address], [place], [place] (336)723-1848, Ext. 123 Second and Fourth Thursday of each month,   opens at 6:30 AM; Clinic ends at 9 AM.  Patients are seen on a first-come first-served basis, and a limited number are seen during each clinic.  ° °[facility] ° [address], [place], [place] (336) 723-7904   Eligibility Requirements °You must have lived in [place], [place], or [place] counties for at least the last three months. °  You cannot be eligible for state or federal sponsored healthcare insurance, including Veterans Administration, Medicaid, or Medicare. °  You generally cannot be eligible for healthcare insurance through your employer.  °  How to apply: °Eligibility screenings are held every Tuesday and Wednesday afternoon from 1:00 pm until 4:00 pm. You do not need an appointment for the interview!  °[facility] [address], [place], [place] 336-631-2330   °[facility]  336-342-8273   °[facility]  336-703-3100   °[facility]  336-570-6415   ° °Behavioral Health Resources in the Community: °Intensive Outpatient Programs °Organization         Address  Phone  Notes  °[facility] [address], [place], [place] 336-878-6098   °[facility] [address], [place], [place] 336-832-9800   °[facility] [address], [place], [place] ° 336-882-2125   °[facility] [address],  °[place], [place] 1-800-853-5163 or 336-641-4981   °Substance Abuse Resources °Organization          Address  Phone  Notes  °[facility]  336-882-2125   °[facility]  336-784-9470   °[facility]  336-285-9073   °[facility]  336-845-3988   °Residential & Outpatient Substance Abuse Program  1-800-659-3381   °Psychological Services °Organization         Address  Phone  Notes  °[facility]  336- 832-9600   °[facility]  336- 378-7881   °[facility] [address], [place] 1-800-853-5163 or 336-641-4981   ° °Mobile Crisis Teams °Organization         Address  Phone  Notes  °[facility], Mobile Crisis Care Unit  1-877-626-1772   °Assertive °[facility] ° [address] [place], [place] 336-834-9664   °Sharon DeEsch [address] °[place] 336-554-5454   ° °Self-Help/Support Groups °Organization         Address  Phone             Notes  °[facility] - variety of support groups  336- 373-1402 Call for more information  °Narcotics Anonymous (NA), [facility] [address], °[place]  2 meetings at this location  ° °Residential Treatment Programs °Organization         Address  Phone  Notes  °[facility] [address],    °[place]  1-866-801-8205   °[facility] ° [address], [place], [place] 704-293-8524   °[facility] [address], [place] 336-845-3988 Admissions: 8am-3pm M-F  °[facility] [address]    °[place], [place] 336-841-1104   °[facility] [address], [place], [place] 336-379-7146   °[facility] [address]  °[place], [place] 336-285-9073   °[facility] - Intensive Outpatient [address], , [place] 336-852-3033   °[facility] [address]  °[place], [place] 1-877-615-2722   or 336-784-9470   °[facility] [address] [place], [place]  336-227-7417 Accepts Medicaid  °[facility] [address]  °[place] 1-800-659-3381 Substance Abuse/Addiction Treatment  ° °[place] Behavioral Health Resources °Organization         Address  Phone  Notes  °[facility]  (888) 581-9988   °Julie Brannon, PhD [address] [place], [place]   (336) 349-5553 or (336) 951-0000   °[facility]   [address] °[place], [place] (336) 349-4454   °[facility] [address], [place], [place] (336) 342-8316 Insurance/Medicaid/sponsorship through [facility]  °[facility] [address]                                    [place], [place] (336) 342-8316 Therapy/tele-psych/case  °[facility] [address]  ° [place], [place] (336) 349-2233    °Dr. Arfeen  (336) 349-4544   °[facility] [place]  [employer] [facility] [place] [facility] 1) [address],  °2) [address], [place] °3)  [address], [place] (336) 349-3220 °(336) 342-7768 ° °(336) 342-8140   °[place] [facility] (336) 342-1394 or (336) 342-3537 (After Hours)    ° ° ° °

## 2015-06-07 NOTE — ED Notes (Signed)
Rash on hands has noted to improve greatly, rash on legs is less than noted

## 2015-06-07 NOTE — ED Notes (Signed)
Resp even and non labored

## 2015-06-07 NOTE — ED Notes (Signed)
Presents with CP and SOB, hx of PE
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
## 2015-06-07 NOTE — ED Notes (Signed)
Pt awaiting troponin level results
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
## 2015-11-20 ENCOUNTER — Emergency Department (HOSPITAL_BASED_OUTPATIENT_CLINIC_OR_DEPARTMENT_OTHER)
Admission: EM | Admit: 2015-11-20 | Discharge: 2015-11-21 | Disposition: A | Discharge status: Home / Self Care | Payer: MEDICAID | Attending: Emergency Medicine | Admitting: Emergency Medicine

## 2015-11-20 ENCOUNTER — Encounter (HOSPITAL_BASED_OUTPATIENT_CLINIC_OR_DEPARTMENT_OTHER): Payer: Self-pay | Admitting: *Deleted

## 2015-11-20 DIAGNOSIS — K920 Hematemesis: Secondary | ICD-10-CM

## 2015-11-20 DIAGNOSIS — Z8679 Personal history of other diseases of the circulatory system: Secondary | ICD-10-CM | POA: Insufficient documentation

## 2015-11-20 DIAGNOSIS — M79662 Pain in left lower leg: Secondary | ICD-10-CM | POA: Insufficient documentation

## 2015-11-20 DIAGNOSIS — Z8739 Personal history of other diseases of the musculoskeletal system and connective tissue: Secondary | ICD-10-CM | POA: Insufficient documentation

## 2015-11-20 DIAGNOSIS — Z862 Personal history of diseases of the blood and blood-forming organs and certain disorders involving the immune mechanism: Secondary | ICD-10-CM | POA: Insufficient documentation

## 2015-11-20 DIAGNOSIS — E119 Type 2 diabetes mellitus without complications: Secondary | ICD-10-CM | POA: Insufficient documentation

## 2015-11-20 DIAGNOSIS — Z86711 Personal history of pulmonary embolism: Secondary | ICD-10-CM | POA: Insufficient documentation

## 2015-11-20 LAB — CBG MONITORING, ED: GLUCOSE-CAPILLARY: 179 mg/dL — AB (ref 65–99)

## 2015-11-20 NOTE — ED Notes (Signed)
Pt reports that he ate [employer] at work, vomited x 1 that was red.  Denies pain, denies nausea at this time.

## 2015-11-20 NOTE — ED Notes (Signed)
CBG 179 
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
## 2015-11-21 ENCOUNTER — Ambulatory Visit (HOSPITAL_BASED_OUTPATIENT_CLINIC_OR_DEPARTMENT_OTHER)
Admission: RE | Admit: 2015-11-21 | Discharge: 2015-11-21 | Disposition: A | Discharge status: Home / Self Care | Payer: BLUE CROSS/BLUE SHIELD | Source: Ambulatory Visit | Attending: Emergency Medicine | Admitting: Emergency Medicine

## 2015-11-21 ENCOUNTER — Encounter (HOSPITAL_BASED_OUTPATIENT_CLINIC_OR_DEPARTMENT_OTHER): Payer: Self-pay | Admitting: Emergency Medicine

## 2015-11-21 DIAGNOSIS — M79662 Pain in left lower leg: Secondary | ICD-10-CM | POA: Insufficient documentation

## 2015-11-21 DIAGNOSIS — M7989 Other specified soft tissue disorders: Secondary | ICD-10-CM | POA: Insufficient documentation

## 2015-11-21 LAB — BASIC METABOLIC PANEL
Anion gap: 10 (ref 5–15)
BUN: 14 mg/dL (ref 6–20)
CALCIUM: 8.9 mg/dL (ref 8.9–10.3)
CHLORIDE: 100 mmol/L — AB (ref 101–111)
CO2: 27 mmol/L (ref 22–32)
CREATININE: 1.32 mg/dL — AB (ref 0.61–1.24)
GFR calc non Af Amer: >60 mL/min (ref >60)
Glucose, Bld: 127 mg/dL — ABNORMAL HIGH (ref 65–99)
Potassium: 3.3 mmol/L — ABNORMAL LOW (ref 3.5–5.1)
SODIUM: 137 mmol/L (ref 135–145)

## 2015-11-21 LAB — CBC WITH DIFFERENTIAL/PLATELET
BASOS ABS: 0 10*3/uL (ref 0.0–0.1)
BASOS PCT: 0 %
EOS ABS: 0.1 10*3/uL (ref 0.0–0.7)
Eosinophils Relative: 1 %
HEMATOCRIT: 42.2 % (ref 39.0–52.0)
Hemoglobin: 14.2 g/dL (ref 13.0–17.0)
Lymphocytes Relative: 41 %
Lymphs Abs: 3.6 10*3/uL (ref 0.7–4.0)
MCH: 28.5 pg (ref 26.0–34.0)
MCHC: 33.6 g/dL (ref 30.0–36.0)
MCV: 84.6 fL (ref 78.0–100.0)
MONO ABS: 0.9 10*3/uL (ref 0.1–1.0)
Monocytes Relative: 11 %
NEUTROS ABS: 4.2 10*3/uL (ref 1.7–7.7)
Neutrophils Relative %: 47 %
Platelets: 352 10*3/uL (ref 150–400)
RBC: 4.99 MIL/uL (ref 4.22–5.81)
RDW: 13.4 % (ref 11.5–15.5)
WBC: 8.8 10*3/uL (ref 4.0–10.5)

## 2015-11-21 LAB — D-DIMER, QUANTITATIVE (NOT AT ARMC): D DIMER QUANT: 0.42 ug{FEU}/mL (ref 0.00–0.50)

## 2015-11-21 MED ORDER — OMEPRAZOLE 40 MG PO CPDR: 40.0000 mg | DELAYED_RELEASE_CAPSULE | Freq: Every day | ORAL | Status: DC

## 2015-11-21 MED ORDER — PANTOPRAZOLE SODIUM 40 MG IV SOLR
40.0000 mg | Freq: Once | INTRAVENOUS | Status: AC
  Administered 2015-11-21: 40 mg via INTRAVENOUS
  Filled 2015-11-21: qty 40

## 2015-11-21 MED ORDER — ONDANSETRON HCL 4 MG/2ML IJ SOLN
4.0000 mg | Freq: Once | INTRAMUSCULAR | Status: AC
Start: 2015-11-21 — End: 2015-11-21
  Administered 2015-11-21: 4 mg via INTRAVENOUS
  Filled 2015-11-21: qty 2

## 2016-05-21 ENCOUNTER — Emergency Department (HOSPITAL_BASED_OUTPATIENT_CLINIC_OR_DEPARTMENT_OTHER): Payer: Self-pay

## 2016-05-21 ENCOUNTER — Observation Stay (HOSPITAL_BASED_OUTPATIENT_CLINIC_OR_DEPARTMENT_OTHER)
Admission: EM | Admit: 2016-05-21 | Discharge: 2016-05-22 | Disposition: A | Discharge status: Home / Self Care | Payer: Self-pay | Attending: Family Medicine | Admitting: Family Medicine

## 2016-05-21 ENCOUNTER — Observation Stay (HOSPITAL_BASED_OUTPATIENT_CLINIC_OR_DEPARTMENT_OTHER): Payer: Self-pay

## 2016-05-21 ENCOUNTER — Encounter (HOSPITAL_BASED_OUTPATIENT_CLINIC_OR_DEPARTMENT_OTHER): Payer: Self-pay | Admitting: Emergency Medicine

## 2016-05-21 DIAGNOSIS — R0602 Shortness of breath: Secondary | ICD-10-CM

## 2016-05-21 DIAGNOSIS — I2699 Other pulmonary embolism without acute cor pulmonale: Secondary | ICD-10-CM

## 2016-05-21 DIAGNOSIS — R079 Chest pain, unspecified: Secondary | ICD-10-CM

## 2016-05-21 DIAGNOSIS — M179 Osteoarthritis of knee, unspecified: Secondary | ICD-10-CM | POA: Diagnosis present

## 2016-05-21 DIAGNOSIS — M1710 Unilateral primary osteoarthritis, unspecified knee: Secondary | ICD-10-CM | POA: Diagnosis present

## 2016-05-21 DIAGNOSIS — R739 Hyperglycemia, unspecified: Secondary | ICD-10-CM | POA: Diagnosis present

## 2016-05-21 DIAGNOSIS — Z6841 Body Mass Index (BMI) 40.0 and over, adult: Secondary | ICD-10-CM | POA: Insufficient documentation

## 2016-05-21 DIAGNOSIS — R0781 Pleurodynia: Principal | ICD-10-CM | POA: Diagnosis present

## 2016-05-21 DIAGNOSIS — R030 Elevated blood-pressure reading, without diagnosis of hypertension: Secondary | ICD-10-CM

## 2016-05-21 DIAGNOSIS — R0789 Other chest pain: Secondary | ICD-10-CM | POA: Diagnosis present

## 2016-05-21 DIAGNOSIS — E1165 Type 2 diabetes mellitus with hyperglycemia: Secondary | ICD-10-CM | POA: Insufficient documentation

## 2016-05-21 DIAGNOSIS — M7989 Other specified soft tissue disorders: Secondary | ICD-10-CM

## 2016-05-21 DIAGNOSIS — IMO0002 Reserved for concepts with insufficient information to code with codable children: Secondary | ICD-10-CM | POA: Diagnosis present

## 2016-05-21 DIAGNOSIS — Z86711 Personal history of pulmonary embolism: Secondary | ICD-10-CM | POA: Insufficient documentation

## 2016-05-21 DIAGNOSIS — K219 Gastro-esophageal reflux disease without esophagitis: Secondary | ICD-10-CM

## 2016-05-21 DIAGNOSIS — IMO0001 Reserved for inherently not codable concepts without codable children: Secondary | ICD-10-CM

## 2016-05-21 LAB — BASIC METABOLIC PANEL
Anion gap: 11 (ref 5–15)
BUN: 13 mg/dL (ref 6–20)
CHLORIDE: 104 mmol/L (ref 101–111)
CO2: 21 mmol/L — AB (ref 22–32)
Calcium: 9.1 mg/dL (ref 8.9–10.3)
Creatinine, Ser: 1.01 mg/dL (ref 0.61–1.24)
GFR calc Af Amer: >60 mL/min (ref >60)
GFR calc non Af Amer: >60 mL/min (ref >60)
Glucose, Bld: 162 mg/dL — ABNORMAL HIGH (ref 65–99)
POTASSIUM: 3.8 mmol/L (ref 3.5–5.1)
SODIUM: 136 mmol/L (ref 135–145)

## 2016-05-21 LAB — CBC WITH DIFFERENTIAL/PLATELET
Basophils Absolute: 0 10*3/uL (ref 0.0–0.1)
Basophils Relative: 0 %
EOS ABS: 0.1 10*3/uL (ref 0.0–0.7)
Eosinophils Relative: 1 %
HEMATOCRIT: 41.2 % (ref 39.0–52.0)
HEMOGLOBIN: 14.4 g/dL (ref 13.0–17.0)
LYMPHS ABS: 3.9 10*3/uL (ref 0.7–4.0)
LYMPHS PCT: 49 %
MCH: 28.9 pg (ref 26.0–34.0)
MCHC: 35 g/dL (ref 30.0–36.0)
MCV: 82.7 fL (ref 78.0–100.0)
MONOS PCT: 9 %
Monocytes Absolute: 0.8 10*3/uL (ref 0.1–1.0)
NEUTROS ABS: 3.2 10*3/uL (ref 1.7–7.7)
NEUTROS PCT: 41 %
Platelets: 328 10*3/uL (ref 150–400)
RBC: 4.98 MIL/uL (ref 4.22–5.81)
RDW: 13.5 % (ref 11.5–15.5)
WBC: 7.9 10*3/uL (ref 4.0–10.5)

## 2016-05-21 LAB — I-STAT ARTERIAL BLOOD GAS, ED
ACID-BASE EXCESS: 1 mmol/L (ref 0.0–2.0)
Bicarbonate: 22.6 mmol/L (ref 20.0–28.0)
O2 SAT: 99 %
PH ART: 7.547 — AB (ref 7.350–7.450)
PO2 ART: 136 mmHg — AB (ref 83.0–108.0)
TCO2: 23 mmol/L (ref 0–100)
pCO2 arterial: 26.1 mmHg — ABNORMAL LOW (ref 32.0–48.0)

## 2016-05-21 LAB — GLUCOSE, CAPILLARY
GLUCOSE-CAPILLARY: 253 mg/dL — AB (ref 65–99)
GLUCOSE-CAPILLARY: 284 mg/dL — AB (ref 65–99)
Glucose-Capillary: 203 mg/dL — ABNORMAL HIGH (ref 65–99)

## 2016-05-21 LAB — HEPARIN LEVEL (UNFRACTIONATED)
HEPARIN UNFRACTIONATED: 0.78 [IU]/mL — AB (ref 0.30–0.70)
Heparin Unfractionated: 0.54 IU/mL (ref 0.30–0.70)

## 2016-05-21 LAB — TROPONIN I

## 2016-05-21 LAB — D-DIMER, QUANTITATIVE (NOT AT ARMC): D DIMER QUANT: 0.35 ug{FEU}/mL (ref 0.00–0.50)

## 2016-05-21 LAB — MRSA PCR SCREENING: MRSA by PCR: NEGATIVE

## 2016-05-21 MED ORDER — PANTOPRAZOLE SODIUM 40 MG PO TBEC
40.0000 mg | DELAYED_RELEASE_TABLET | Freq: Every day | ORAL | Status: DC
  Administered 2016-05-22: 40 mg via ORAL
  Filled 2016-05-21: qty 1

## 2016-05-21 MED ORDER — HEPARIN BOLUS VIA INFUSION
6500.0000 [IU] | Freq: Once | INTRAVENOUS | Status: AC
  Administered 2016-05-21: 6500 [IU] via INTRAVENOUS

## 2016-05-21 MED ORDER — FAMOTIDINE IN NACL 20-0.9 MG/50ML-% IV SOLN
20.0000 mg | Freq: Once | INTRAVENOUS | Status: AC
  Administered 2016-05-21: 20 mg via INTRAVENOUS
  Filled 2016-05-21: qty 50

## 2016-05-21 MED ORDER — DIPHENHYDRAMINE HCL 50 MG/ML IJ SOLN
12.5000 mg | Freq: Once | INTRAMUSCULAR | Status: AC
  Administered 2016-05-21: 12.5 mg via INTRAVENOUS
  Filled 2016-05-21: qty 1

## 2016-05-21 MED ORDER — PREDNISONE 20 MG PO TABS
50.0000 mg | ORAL_TABLET | Freq: Four times a day (QID) | ORAL | Status: AC
  Administered 2016-05-21 – 2016-05-22 (×3): 50 mg via ORAL
  Filled 2016-05-21 (×3): qty 2

## 2016-05-21 MED ORDER — INSULIN ASPART 100 UNIT/ML ~~LOC~~ SOLN
0.0000 [IU] | Freq: Three times a day (TID) | SUBCUTANEOUS | Status: DC
  Administered 2016-05-21 – 2016-05-22 (×3): 5 [IU] via SUBCUTANEOUS

## 2016-05-21 MED ORDER — DEXAMETHASONE SODIUM PHOSPHATE 10 MG/ML IJ SOLN
10.0000 mg | Freq: Once | INTRAMUSCULAR | Status: AC
  Administered 2016-05-21: 10 mg via INTRAVENOUS
  Filled 2016-05-21: qty 1

## 2016-05-21 MED ORDER — ONDANSETRON HCL 4 MG/2ML IJ SOLN: 4.0000 mg | Freq: Four times a day (QID) | INTRAMUSCULAR | Status: DC | PRN

## 2016-05-21 MED ORDER — HEPARIN (PORCINE) IN NACL 100-0.45 UNIT/ML-% IJ SOLN
1700.0000 [IU]/h | INTRAMUSCULAR | Status: DC
  Administered 2016-05-21: 1800 [IU]/h via INTRAVENOUS
  Administered 2016-05-22: 1700 [IU]/h via INTRAVENOUS
  Filled 2016-05-21 (×3): qty 250

## 2016-05-21 MED ORDER — DIPHENHYDRAMINE HCL 25 MG PO CAPS
50.0000 mg | ORAL_CAPSULE | Freq: Once | ORAL | Status: AC
  Administered 2016-05-22: 50 mg via ORAL
  Filled 2016-05-21: qty 2

## 2016-05-21 NOTE — Progress Notes (Signed)
ANTICOAGULATION CONSULT NOTE  Pharmacy Consult for Heparin Indication: r/o pulmonary embolus  Allergies  Allergen Reactions  . Contrast Media [Iodinated Diagnostic Agents]     Pt states he thinks it makes him itch  . Heparin Itching    Itching over entire body  . Lovenox [Enoxaparin Sodium] Itching    Itching over entire body  . Percocet [Oxycodone-Acetaminophen] Hives and Itching    Patient Measurements: Height: 5\' 11"  (180.3 cm) Weight: (!) 304 lb 8 oz (138.1 kg) IBW/kg (Calculated) : 75.3 Heparin Dosing Weight: 108 kg  Vital Signs: Temp: 98.4 F (36.9 C) (09/23 1244) Temp Source: Oral (09/23 1244) BP: 134/82 (09/23 1244) Pulse Rate: 74 (09/23 1244)  Labs:  Recent Labs  05/21/16 0625 05/21/16 1441  HGB 14.4  --   HCT 41.2  --   PLT 328  --   HEPARINUNFRC  --  0.78*  CREATININE 1.01  --   TROPONINI <0.03  --     Estimated Creatinine Clearance: 132.5 mL/min (by C-G formula based on SCr of 1.01 mg/dL).   Medical History: Past Medical History:  Diagnosis Date  . Anemia age 89 or 7   none since  . Arthritis    oa  . Diabetes mellitus without complication (HCC)    newly diagnosed  . GERD (gastroesophageal reflux disease)   . Head problem    back of top of head area swells at times, had glass in wound at times, pt instrcuted to follow up with md, if any pus seen  . Headache(784.0)    migraine  . Morbid obesity (HCC)   . PE (pulmonary embolism) 10/01/2014  . Prediabetes on 04-28-14    Assessment: 45 y.o. M presents with CP. Pt with h/o PE - pt on Xarelto in past for PE but has been off of for >1 year, but no AC PTA. Noted pt with "allergy" to heparin of itching over entire body. Dr. Nicanor AlconPalumbo has prescribed Benadryl and dexamethasone to pre-medicate. To begin heparin for r/o PE. CBC stable, no bleed documented.  Initial heparin level slightly high (0.78) on 1800 units/h. No bleed/IV line issues per RN.  Goal of Therapy:  Heparin level 0.3-0.7  units/ml Monitor platelets by anticoagulation protocol: Yes   Plan:  Decrease heparin gtt to 1700 units/hr Will f/u heparin level in 6 hours Daily heparin level and CBC Monitor for s/sx bleeding  Babs BertinHaley Sareen Randon, PharmD, [facility] Clinical Pharmacist Pager [phone] 05/21/2016 3:34 PM

## 2016-05-21 NOTE — Progress Notes (Signed)
ANTICOAGULATION CONSULT NOTE - Follow Up Consult  Pharmacy Consult for heparin Indication: r/o PE  Labs:  Recent Labs  05/21/16 0625 05/21/16 1441 05/21/16 2306  HGB 14.4  --   --   HCT 41.2  --   --   PLT 328  --   --   HEPARINUNFRC  --  0.78* 0.54  CREATININE 1.01  --   --   TROPONINI <0.03  --   --     Assessment/Plan:  45yo male therapeutic on heparin after rate change. Will continue gtt at current rate and confirm stable with am labs.   Vernard GamblesVeronda Lessa Huge, PharmD, BCPS  05/21/2016,11:58 PM

## 2016-05-21 NOTE — ED Notes (Signed)
Patient transported to X-ray 

## 2016-05-21 NOTE — ED Triage Notes (Signed)
Pt reports chest pain with shob and nausea that started this morning.

## 2016-05-21 NOTE — Progress Notes (Signed)
VASCULAR LAB PRELIMINARY  PRELIMINARY  PRELIMINARY  PRELIMINARY  Bilateral lower extremity venous duplex completed.    Preliminary report:  There is no DVT or SVT noted in the bilateral lower extremities.   Blayke Cordrey, RVT 05/21/2016, 6:08 PM
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
## 2016-05-21 NOTE — ED Triage Notes (Signed)
Pt also reports non-productive cough.
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
## 2016-05-22 ENCOUNTER — Observation Stay (HOSPITAL_COMMUNITY): Payer: Self-pay

## 2016-05-22 ENCOUNTER — Encounter (HOSPITAL_COMMUNITY): Payer: Self-pay | Admitting: Radiology

## 2016-05-22 DIAGNOSIS — M1712 Unilateral primary osteoarthritis, left knee: Secondary | ICD-10-CM

## 2016-05-22 DIAGNOSIS — E1165 Type 2 diabetes mellitus with hyperglycemia: Secondary | ICD-10-CM

## 2016-05-22 DIAGNOSIS — IMO0001 Reserved for inherently not codable concepts without codable children: Secondary | ICD-10-CM

## 2016-05-22 DIAGNOSIS — K219 Gastro-esophageal reflux disease without esophagitis: Secondary | ICD-10-CM

## 2016-05-22 DIAGNOSIS — R030 Elevated blood-pressure reading, without diagnosis of hypertension: Secondary | ICD-10-CM

## 2016-05-22 DIAGNOSIS — R0781 Pleurodynia: Secondary | ICD-10-CM

## 2016-05-22 LAB — CBC
HCT: 44.3 % (ref 39.0–52.0)
Hemoglobin: 14.6 g/dL (ref 13.0–17.0)
MCH: 28 pg (ref 26.0–34.0)
MCHC: 33 g/dL (ref 30.0–36.0)
MCV: 85 fL (ref 78.0–100.0)
PLATELETS: 349 10*3/uL (ref 150–400)
RBC: 5.21 MIL/uL (ref 4.22–5.81)
RDW: 13.2 % (ref 11.5–15.5)
WBC: 11.1 10*3/uL — AB (ref 4.0–10.5)

## 2016-05-22 LAB — GLUCOSE, CAPILLARY
GLUCOSE-CAPILLARY: 260 mg/dL — AB (ref 65–99)
Glucose-Capillary: 273 mg/dL — ABNORMAL HIGH (ref 65–99)

## 2016-05-22 LAB — HEMOGLOBIN A1C
HEMOGLOBIN A1C: 7.7 % — AB (ref 4.8–5.6)
MEAN PLASMA GLUCOSE: 174 mg/dL

## 2016-05-22 LAB — HEPARIN LEVEL (UNFRACTIONATED): Heparin Unfractionated: 0.63 IU/mL (ref 0.30–0.70)

## 2016-05-22 MED ORDER — IOPAMIDOL (ISOVUE-370) INJECTION 76%
INTRAVENOUS | Status: AC
  Administered 2016-05-22: 100 mL
  Filled 2016-05-22: qty 100

## 2016-05-22 MED ORDER — PANTOPRAZOLE SODIUM 40 MG PO TBEC: 40.0000 mg | DELAYED_RELEASE_TABLET | Freq: Every day | ORAL | 1 refills | Status: DC

## 2016-05-22 MED ORDER — METFORMIN HCL 500 MG PO TABS: 500.0000 mg | ORAL_TABLET | Freq: Two times a day (BID) | ORAL | 1 refills | Status: DC

## 2016-05-22 MED ORDER — ASPIRIN EC 81 MG PO TBEC: 81.0000 mg | DELAYED_RELEASE_TABLET | Freq: Every day | ORAL | 1 refills | Status: AC

## 2016-05-22 NOTE — Progress Notes (Signed)
Pt received discharge teaching. Pts IV removed. Pt understood discharge teaching and had no other questions.

## 2016-05-22 NOTE — Progress Notes (Signed)
ANTICOAGULATION CONSULT NOTE - Follow Up Consult  Pharmacy Consult for Heparin Indication: r/o PE   Assessment: [age] y.o. M presents with CP. Pt with h/o PE - pt on Xarelto in past for PE but has been off of for >1 year, but no AC PTA. Noted pt with "allergy" to heparin of itching over entire body. Dr. Nicanor AlconPalumbo has prescribed Benadryl and dexamethasone to pre-medicate. To begin heparin for r/o PE. CBC stable, no bleed documented. Confirmatory HL therapeutic.  Goal of Therapy:  Heparin level 0.3-0.7 units/ml Monitor platelets by anticoagulation protocol: Yes   Plan:  Continue heparin gtt to 1700 units/hr Daily heparin level and CBC Monitor for s/sx bleeding   Allergies  Allergen Reactions  . Contrast Media [Iodinated Diagnostic Agents] Itching    Pt states he thinks it makes him itch  . Heparin Itching    Itching over entire body  . Lovenox [Enoxaparin Sodium] Itching    Itching over entire body  . Percocet [Oxycodone-Acetaminophen] Hives and Itching    Patient Measurements: Height: 5\' 11"  (180.3 cm) Weight: (!) 304 lb 8 oz (138.1 kg) IBW/kg (Calculated) : 75.3 Heparin Dosing Weight: 108 kg  Vital Signs: Temp: 98 F (36.7 C) (09/24 1209) Temp Source: Oral (09/24 1209) BP: 141/79 (09/24 1209) Pulse Rate: 67 (09/24 1209)  Labs:  Recent Labs  05/21/16 0625 05/21/16 1441 05/21/16 2306 05/22/16 0436 05/22/16 0932  HGB 14.4  --   --  14.6  --   HCT 41.2  --   --  44.3  --   PLT 328  --   --  349  --   HEPARINUNFRC  --  0.78* 0.54  --  0.63  CREATININE 1.01  --   --   --   --   TROPONINI <0.03  --   --   --   --     Estimated Creatinine Clearance: 132.5 mL/min (by C-G formula based on SCr of 1.01 mg/dL).   Medications:  Infusions:  . heparin 1,700 Units/hr (05/22/16 1048)    Allie BossierApryl Anderson, PharmD PGY1 Pharmacy Resident [phone] (Pager) 05/22/2016 1:13 PM

## 2016-05-22 NOTE — Discharge Summary (Signed)
Physician Discharge Summary  Marcus Joseph:[id number] DOB: [birth year] DOA: 05/21/2016  PCP: Egbert Garibaldi, NP  Admit date: 05/21/2016 Discharge date: 05/22/2016  Time spent: 35 minutes  Recommendations for Outpatient Follow-up:  1. Reassess BP and start antihypertensive regimen as needed  2. Please follow CBG's and adjust hypoglycemic regimen 3. Please arrange sleep study as an outpatient    Discharge Diagnoses:  Active Problems:   OA (osteoarthritis) of knee   PE (pulmonary embolism)   Hyperglycemia   Morbid obesity (HCC)   Diabetes mellitus type 2, uncontrolled (HCC)   Pleuritic chest pain   Esophageal reflux   Elevated BP   Discharge Condition: stable and improved. Discharge home with instructions to follow up with PCP I 10 days  Diet recommendation: heart healthy, low carbohydrates and low calorie diet   Filed Weights   05/21/16 0619 05/21/16 1244  Weight: (!) 142 kg (313 lb) (!) 138.1 kg (304 lb 8 oz)    History of present illness:   45 y.o. male with medical history significant for PE in February 2015,not on anticoagulation for the last year, his 1st PE  following a knee surgery in the year 2015. At the time, the patient was started on Coumadin, followed by Xarelto for 4 months, and has not been an anticoagulation since June 2016. He has not completed the course of Xarelto to 20 mg daily, due to inability to afford the drug. Ever since then, the patient has been on a  daily baby aspirin. He also carries a history of diabetes, chronic headaches, GERD, arthritis and anemia. Admitted to be not the most compliant with medication regimen. Patient was admitted on 9/23 from medical Center at [facility], after presenting with progressive left pleuritic chest pain, worse with deep inspiration, non reproducible, accompanied by dyspnea on exertion. Patient denies any fever, chills or night sweats. Denies lower extremity calf pain, but reports bilateral LE swelling,  without erythema.  Hospital Course:  Chest Pain: pleuritic in nature and with Bilat lower extremity swelling; had H/o PE initially treated with  Coumadin, then Lovenox which the patient failed to complete. He has been of anticoagulation for over a year now. Due to "allergy" to contrast was admitted for further evaluation and to have CTA. Patient had elevated D-dimer No abnormalities on telemetry and/or EKG Neg troponin Neg CTA for PE LE duplex neg for DVT/SVT bilaterally No further CP at discharge At this moment no need for anticoagulation is needed  Patient will use PPI and PRN ibuprofen for his CP  Diabetes II Current blood sugar level is 162-230 A1C 7.7 Patient has been discharge on metformin BID and encourage to follow low carb diet  Will need follow up as an outpatient and further adjustments on hypoglycemic regimen   GERD Continue PPI Encourage to lose weight and follow lifestyle changes to help with reflux.  Obesity Body mass index is 42.47 kg/m. Low calorie diet and increase exercise discussed with patient  Suspected OSA Needs sleep study and CPAP usage as an outpatient       Elevated BP     No prior hx of HTN.     Will need reassessment of his BP as an outpatient and initiation of antihypertensive regimen        as needed   Procedures:  CT angio: neg for PE. Suggesting cardiomegaly and pulmonary HTN  LE duplex: neg for DVT or SVT bilaterally   Consultations:  None   Discharge Exam: Vitals:   05/22/16  [id number] 05/22/16 1209  BP: 121/74 (!) 141/79  Pulse: 64 67  Resp:    Temp: 98 F (36.7 C) 98 F (36.7 C)    General: afebrile, reports no SOB and no CP. Patient found snoring loudly while first entering into his room. No nausea and no vomiting. Cardiovascular: S1 and S2, no rubs, no gallops Respiratory: CTA bilaterally Abd: obese, positive BS, no guarding, no tenderness Extremities: trace edema bilaterally  Neuro: no focal deficit  appreciated.  Discharge Instructions   Discharge Instructions    Diet - low sodium heart healthy    Complete by:  As directed    Discharge instructions    Complete by:  As directed    Follow up with PCP in 10 days Take medications as prescribed  Follow low carbohydrates and low calorie diet  Maintain adequate hydration  Arrange for outpatient sleep study  Follow a low sodium diet     Current Discharge Medication List    START taking these medications   Details  aspirin EC 81 MG tablet Take 1 tablet (81 mg total) by mouth daily. Qty: 30 tablet, Refills: 1    metFORMIN (GLUCOPHAGE) 500 MG tablet Take 1 tablet (500 mg total) by mouth 2 (two) times daily with a meal. Qty: 60 tablet, Refills: 1    pantoprazole (PROTONIX) 40 MG tablet Take 1 tablet (40 mg total) by mouth daily. Qty: 30 tablet, Refills: 1      CONTINUE these medications which have NOT CHANGED   Details  ibuprofen (ADVIL,MOTRIN) 200 MG tablet Take 400 mg by mouth every 6 (six) hours as needed for moderate pain.       STOP taking these medications     omeprazole (PRILOSEC) 40 MG capsule        Allergies  Allergen Reactions  . Contrast Media [Iodinated Diagnostic Agents] Itching    Pt states he thinks it makes him itch  . Heparin Itching    Itching over entire body  . Lovenox [Enoxaparin Sodium] Itching    Itching over entire body  . Percocet [Oxycodone-Acetaminophen] Hives and Itching   Follow-up Information    M[place], Joelene MillinKIMBERLY M, NP. Schedule an appointment as soon as possible for a visit in 10 day(s).   Contact information: [facility] [address] [place] [id number] [phone]           The results of significant diagnostics from this hospitalization (including imaging, microbiology, ancillary and laboratory) are listed below for reference.    Significant Diagnostic Studies: Dg Chest 2 View  Result Date: 05/21/2016 CLINICAL DATA:  Chest pain, shortness of  breath, and nausea starting this morning. EXAM: CHEST  2 VIEW COMPARISON:  06/07/2015 FINDINGS: Shallow inspiration. Heart size and pulmonary vascularity are normal for technique. No focal airspace disease or consolidation. No blunting of costophrenic angles. No pneumothorax. Mediastinal contours appear intact. IMPRESSION: No active cardiopulmonary disease. Electronically Signed   By: Burman NievesWilliam  Stevens M.D.   On: 05/21/2016 06:45   Ct Angio Chest Pe W Or Wo Contrast  Result Date: 05/22/2016 CLINICAL DATA:  Left-sided chest pain starting yesterday. Short of breath since yesterday. Diabetes. History of pulmonary embolism. Morbid obesity. EXAM: CT ANGIOGRAPHY CHEST WITH CONTRAST TECHNIQUE: Multidetector CT imaging of the chest was performed using the standard protocol during bolus administration of intravenous contrast. Multiplanar CT image reconstructions and MIPs were obtained to evaluate the vascular anatomy. CONTRAST:  100 cc of Isovue 370 COMPARISON:  Chest radiograph of 1 day prior.  CT 06/07/2015. FINDINGS: Cardiovascular: Limitations secondary to mild motion and patient body habitus. The bolus is relatively well timed. No embolism to the lobar to large segmental level. Pulmonary artery enlargement, including a 3.2 cm outflow tract. Normal aortic caliber. Aorta not well opacified. No gross dissection. Mild cardiomegaly. No pericardial effusion. Mediastinum/Nodes: No mediastinal or hilar adenopathy. Lungs/Pleura: No pleural fluid. Minimal exclusion of the left lung base. Dependent atelectasis. Upper Abdomen: Probable hepatic steatosis. Normal imaged portions of the spleen, stomach, pancreas. Musculoskeletal: No acute osseous abnormality. Review of the MIP images confirms the above findings. IMPRESSION: 1. Mild to moderately degraded exam secondary to patient body habitus and mild motion. No pulmonary embolism with limitations above. 2. Mild cardiomegaly. 3. Pulmonary artery enlargement suggests pulmonary  arterial hypertension. Electronically Signed   By: Jeronimo Greaves M.D.   On: 05/22/2016 09:21    Microbiology: Recent Results (from the past 240 hour(s))  MRSA PCR Screening     Status: None   Collection Time: 05/21/16  2:07 PM  Result Value Ref Range Status   MRSA by PCR NEGATIVE NEGATIVE Final    Comment:        The GeneXpert MRSA Assay (FDA approved for NASAL specimens only), is one component of a comprehensive MRSA colonization surveillance program. It is not intended to diagnose MRSA infection nor to guide or monitor treatment for MRSA infections.      Labs: Basic Metabolic Panel:  Recent Labs Lab 05/21/16 0625  NA 136  K 3.8  CL 104  CO2 21*  GLUCOSE 162*  BUN 13  CREATININE 1.01  CALCIUM 9.1   CBC:  Recent Labs Lab 05/21/16 0625 05/22/16 0436  WBC 7.9 11.1*  NEUTROABS 3.2  --   HGB 14.4 14.6  HCT 41.2 44.3  MCV 82.7 85.0  PLT 328 349   Cardiac Enzymes:  Recent Labs Lab 05/21/16 0625  TROPONINI <0.03   BNP: BNP (last 3 results)  Recent Labs  06/07/15 1455  BNP 14.5    CBG:  Recent Labs Lab 05/21/16 1248 05/21/16 1639 05/21/16 2154 05/22/16 0741 05/22/16 1131  GLUCAP 253* 284* 203* 260* 273*     Signed:  Vassie Loll MD.  [facility] 05/22/2016, 3:15 PM

## 2017-11-21 DIAGNOSIS — K219 Gastro-esophageal reflux disease without esophagitis: Secondary | ICD-10-CM

## 2018-04-25 DIAGNOSIS — I82409 Acute embolism and thrombosis of unspecified deep veins of unspecified lower extremity: Secondary | ICD-10-CM | POA: Insufficient documentation

## 2018-05-09 ENCOUNTER — Other Ambulatory Visit: Payer: Self-pay

## 2018-05-09 ENCOUNTER — Observation Stay (HOSPITAL_BASED_OUTPATIENT_CLINIC_OR_DEPARTMENT_OTHER)
Admission: EM | Admit: 2018-05-09 | Discharge: 2018-05-12 | Disposition: A | Discharge status: Home / Self Care | Payer: BLUE CROSS/BLUE SHIELD | Attending: Internal Medicine | Admitting: Internal Medicine

## 2018-05-09 ENCOUNTER — Emergency Department (HOSPITAL_BASED_OUTPATIENT_CLINIC_OR_DEPARTMENT_OTHER): Payer: BLUE CROSS/BLUE SHIELD

## 2018-05-09 ENCOUNTER — Encounter (HOSPITAL_BASED_OUTPATIENT_CLINIC_OR_DEPARTMENT_OTHER): Payer: Self-pay | Admitting: *Deleted

## 2018-05-09 ENCOUNTER — Telehealth (HOSPITAL_BASED_OUTPATIENT_CLINIC_OR_DEPARTMENT_OTHER): Payer: Self-pay | Admitting: Emergency Medicine

## 2018-05-09 DIAGNOSIS — K219 Gastro-esophageal reflux disease without esophagitis: Secondary | ICD-10-CM | POA: Insufficient documentation

## 2018-05-09 DIAGNOSIS — Z832 Family history of diseases of the blood and blood-forming organs and certain disorders involving the immune mechanism: Secondary | ICD-10-CM | POA: Insufficient documentation

## 2018-05-09 DIAGNOSIS — I2699 Other pulmonary embolism without acute cor pulmonale: Secondary | ICD-10-CM | POA: Insufficient documentation

## 2018-05-09 DIAGNOSIS — E119 Type 2 diabetes mellitus without complications: Secondary | ICD-10-CM | POA: Diagnosis present

## 2018-05-09 DIAGNOSIS — M1710 Unilateral primary osteoarthritis, unspecified knee: Secondary | ICD-10-CM | POA: Diagnosis present

## 2018-05-09 DIAGNOSIS — Z9889 Other specified postprocedural states: Secondary | ICD-10-CM | POA: Diagnosis not present

## 2018-05-09 DIAGNOSIS — Z888 Allergy status to other drugs, medicaments and biological substances status: Secondary | ICD-10-CM | POA: Diagnosis not present

## 2018-05-09 DIAGNOSIS — Z8249 Family history of ischemic heart disease and other diseases of the circulatory system: Secondary | ICD-10-CM | POA: Insufficient documentation

## 2018-05-09 DIAGNOSIS — I82441 Acute embolism and thrombosis of right tibial vein: Secondary | ICD-10-CM | POA: Insufficient documentation

## 2018-05-09 DIAGNOSIS — I82491 Acute embolism and thrombosis of other specified deep vein of right lower extremity: Secondary | ICD-10-CM | POA: Insufficient documentation

## 2018-05-09 DIAGNOSIS — E1165 Type 2 diabetes mellitus with hyperglycemia: Secondary | ICD-10-CM | POA: Insufficient documentation

## 2018-05-09 DIAGNOSIS — Z91041 Radiographic dye allergy status: Secondary | ICD-10-CM | POA: Diagnosis not present

## 2018-05-09 DIAGNOSIS — Z7984 Long term (current) use of oral hypoglycemic drugs: Secondary | ICD-10-CM | POA: Insufficient documentation

## 2018-05-09 DIAGNOSIS — I82431 Acute embolism and thrombosis of right popliteal vein: Secondary | ICD-10-CM | POA: Diagnosis not present

## 2018-05-09 DIAGNOSIS — R0789 Other chest pain: Secondary | ICD-10-CM | POA: Diagnosis present

## 2018-05-09 DIAGNOSIS — Z96652 Presence of left artificial knee joint: Secondary | ICD-10-CM | POA: Insufficient documentation

## 2018-05-09 DIAGNOSIS — R7989 Other specified abnormal findings of blood chemistry: Secondary | ICD-10-CM | POA: Diagnosis present

## 2018-05-09 DIAGNOSIS — I82411 Acute embolism and thrombosis of right femoral vein: Secondary | ICD-10-CM | POA: Insufficient documentation

## 2018-05-09 DIAGNOSIS — R0602 Shortness of breath: Secondary | ICD-10-CM | POA: Diagnosis not present

## 2018-05-09 DIAGNOSIS — Z7982 Long term (current) use of aspirin: Secondary | ICD-10-CM | POA: Insufficient documentation

## 2018-05-09 DIAGNOSIS — R778 Other specified abnormalities of plasma proteins: Secondary | ICD-10-CM | POA: Diagnosis present

## 2018-05-09 DIAGNOSIS — M179 Osteoarthritis of knee, unspecified: Secondary | ICD-10-CM | POA: Diagnosis not present

## 2018-05-09 DIAGNOSIS — R0600 Dyspnea, unspecified: Secondary | ICD-10-CM | POA: Diagnosis present

## 2018-05-09 DIAGNOSIS — Z885 Allergy status to narcotic agent status: Secondary | ICD-10-CM | POA: Diagnosis not present

## 2018-05-09 DIAGNOSIS — I420 Dilated cardiomyopathy: Secondary | ICD-10-CM | POA: Diagnosis not present

## 2018-05-09 DIAGNOSIS — IMO0002 Reserved for concepts with insufficient information to code with codable children: Secondary | ICD-10-CM | POA: Diagnosis present

## 2018-05-09 LAB — CBC WITH DIFFERENTIAL/PLATELET
BASOS PCT: 0 %
Basophils Absolute: 0 10*3/uL (ref 0.0–0.1)
Eosinophils Absolute: 0.1 10*3/uL (ref 0.0–0.7)
Eosinophils Relative: 1 %
HEMATOCRIT: 41.7 % (ref 39.0–52.0)
HEMOGLOBIN: 14.4 g/dL (ref 13.0–17.0)
Lymphocytes Relative: 40 %
Lymphs Abs: 3.6 10*3/uL (ref 0.7–4.0)
MCH: 28.8 pg (ref 26.0–34.0)
MCHC: 34.5 g/dL (ref 30.0–36.0)
MCV: 83.4 fL (ref 78.0–100.0)
MONOS PCT: 9 %
Monocytes Absolute: 0.8 10*3/uL (ref 0.1–1.0)
NEUTROS ABS: 4.5 10*3/uL (ref 1.7–7.7)
NEUTROS PCT: 50 %
Platelets: 249 10*3/uL (ref 150–400)
RBC: 5 MIL/uL (ref 4.22–5.81)
RDW: 13.3 % (ref 11.5–15.5)
WBC: 9.1 10*3/uL (ref 4.0–10.5)

## 2018-05-09 LAB — BASIC METABOLIC PANEL
ANION GAP: 11 (ref 5–15)
BUN: 11 mg/dL (ref 6–20)
CALCIUM: 8.7 mg/dL — AB (ref 8.9–10.3)
CHLORIDE: 102 mmol/L (ref 98–111)
CO2: 24 mmol/L (ref 22–32)
CREATININE: 1 mg/dL (ref 0.61–1.24)
Glucose, Bld: 204 mg/dL — ABNORMAL HIGH (ref 70–99)
POTASSIUM: 3.5 mmol/L (ref 3.5–5.1)
Sodium: 137 mmol/L (ref 135–145)

## 2018-05-09 LAB — D-DIMER, QUANTITATIVE (NOT AT ARMC): D DIMER QUANT: 9.26 ug{FEU}/mL — AB (ref 0.00–0.50)

## 2018-05-09 NOTE — ED Triage Notes (Addendum)
Pt co SOb/ CP with exertion x 1 week HX PE

## 2018-05-10 ENCOUNTER — Observation Stay (HOSPITAL_COMMUNITY): Payer: BLUE CROSS/BLUE SHIELD

## 2018-05-10 ENCOUNTER — Observation Stay (HOSPITAL_BASED_OUTPATIENT_CLINIC_OR_DEPARTMENT_OTHER): Payer: BLUE CROSS/BLUE SHIELD

## 2018-05-10 ENCOUNTER — Encounter (HOSPITAL_COMMUNITY): Payer: Self-pay | Admitting: General Practice

## 2018-05-10 DIAGNOSIS — R748 Abnormal levels of other serum enzymes: Secondary | ICD-10-CM | POA: Diagnosis not present

## 2018-05-10 DIAGNOSIS — Z86718 Personal history of other venous thrombosis and embolism: Secondary | ICD-10-CM | POA: Diagnosis not present

## 2018-05-10 DIAGNOSIS — Z794 Long term (current) use of insulin: Secondary | ICD-10-CM | POA: Diagnosis not present

## 2018-05-10 DIAGNOSIS — E1165 Type 2 diabetes mellitus with hyperglycemia: Secondary | ICD-10-CM

## 2018-05-10 DIAGNOSIS — R0789 Other chest pain: Secondary | ICD-10-CM | POA: Diagnosis not present

## 2018-05-10 LAB — HEMOGLOBIN A1C
HEMOGLOBIN A1C: 11.2 % — AB (ref 4.8–5.6)
MEAN PLASMA GLUCOSE: 274.74 mg/dL

## 2018-05-10 LAB — GLUCOSE, CAPILLARY
GLUCOSE-CAPILLARY: 212 mg/dL — AB (ref 70–99)
GLUCOSE-CAPILLARY: 179 mg/dL — AB (ref 70–99)
GLUCOSE-CAPILLARY: 296 mg/dL — AB (ref 70–99)
GLUCOSE-CAPILLARY: 270 mg/dL — AB (ref 70–99)
GLUCOSE-CAPILLARY: 252 mg/dL — AB (ref 70–99)
Glucose-Capillary: 265 mg/dL — ABNORMAL HIGH (ref 70–99)

## 2018-05-10 LAB — TROPONIN I
TROPONIN I: 0.04 ng/mL — AB (ref <0.03)
TROPONIN I: 0.32 ng/mL — AB (ref <0.03)
TROPONIN I: 0.09 ng/mL — AB (ref <0.03)
Troponin I: 0.13 ng/mL (ref <0.03)

## 2018-05-10 LAB — HEPARIN LEVEL (UNFRACTIONATED)
Heparin Unfractionated: 0.19 IU/mL — ABNORMAL LOW (ref 0.30–0.70)
Heparin Unfractionated: 0.67 IU/mL (ref 0.30–0.70)

## 2018-05-10 LAB — ECHOCARDIOGRAM COMPLETE
Height: 71 in
WEIGHTICAEL: 4867.2 [oz_av]

## 2018-05-10 MED ORDER — HEPARIN BOLUS VIA INFUSION
3000.0000 [IU] | Freq: Once | INTRAVENOUS | Status: AC
  Administered 2018-05-10: 3000 [IU] via INTRAVENOUS
  Filled 2018-05-10: qty 3000

## 2018-05-10 MED ORDER — DIPHENHYDRAMINE HCL 50 MG/ML IJ SOLN: 50.0000 mg | Freq: Once | INTRAMUSCULAR | Status: AC

## 2018-05-10 MED ORDER — HEPARIN (PORCINE) IN NACL 100-0.45 UNIT/ML-% IJ SOLN
12.0000 [IU]/kg/h | Freq: Once | INTRAMUSCULAR | Status: AC
  Administered 2018-05-10: 12 [IU]/kg/h via INTRAVENOUS
  Filled 2018-05-10: qty 250

## 2018-05-10 MED ORDER — IOPAMIDOL (ISOVUE-370) INJECTION 76%
100.0000 mL | Freq: Once | INTRAVENOUS | Status: AC | PRN
  Administered 2018-05-10: 54 mL via INTRAVENOUS

## 2018-05-10 MED ORDER — INSULIN DETEMIR 100 UNIT/ML ~~LOC~~ SOLN
20.0000 [IU] | Freq: Two times a day (BID) | SUBCUTANEOUS | Status: DC
  Administered 2018-05-10 – 2018-05-12 (×5): 20 [IU] via SUBCUTANEOUS
  Filled 2018-05-10 (×5): qty 0.2

## 2018-05-10 MED ORDER — ACETAMINOPHEN 650 MG RE SUPP: 650.0000 mg | Freq: Four times a day (QID) | RECTAL | Status: DC | PRN

## 2018-05-10 MED ORDER — PANTOPRAZOLE SODIUM 40 MG PO TBEC
40.0000 mg | DELAYED_RELEASE_TABLET | Freq: Every day | ORAL | Status: DC
  Administered 2018-05-10 – 2018-05-12 (×3): 40 mg via ORAL
  Filled 2018-05-10 (×3): qty 1

## 2018-05-10 MED ORDER — DIPHENHYDRAMINE HCL 25 MG PO CAPS
50.0000 mg | ORAL_CAPSULE | Freq: Once | ORAL | Status: AC
  Administered 2018-05-10: 50 mg via ORAL
  Filled 2018-05-10: qty 2

## 2018-05-10 MED ORDER — HEPARIN (PORCINE) IN NACL 100-0.45 UNIT/ML-% IJ SOLN
1750.0000 [IU]/h | INTRAMUSCULAR | Status: DC
  Administered 2018-05-10: 1650 [IU]/h via INTRAVENOUS
  Administered 2018-05-11 – 2018-05-12 (×3): 1950 [IU]/h via INTRAVENOUS
  Filled 2018-05-10 (×4): qty 250

## 2018-05-10 MED ORDER — PERFLUTREN LIPID MICROSPHERE
1.0000 mL | INTRAVENOUS | Status: AC | PRN
  Administered 2018-05-10: 2 mL via INTRAVENOUS
  Filled 2018-05-10: qty 10

## 2018-05-10 MED ORDER — PERFLUTREN LIPID MICROSPHERE
INTRAVENOUS | Status: AC
  Administered 2018-05-10: 2 mL
  Filled 2018-05-10: qty 10

## 2018-05-10 MED ORDER — SODIUM CHLORIDE 0.9% FLUSH: 3.0000 mL | INTRAVENOUS | Status: DC | PRN

## 2018-05-10 MED ORDER — PREDNISONE 50 MG PO TABS
50.0000 mg | ORAL_TABLET | Freq: Four times a day (QID) | ORAL | Status: AC
  Administered 2018-05-10 (×3): 50 mg via ORAL
  Filled 2018-05-10 (×3): qty 1

## 2018-05-10 MED ORDER — INSULIN ASPART 100 UNIT/ML ~~LOC~~ SOLN
6.0000 [IU] | Freq: Three times a day (TID) | SUBCUTANEOUS | Status: DC
  Administered 2018-05-10 – 2018-05-12 (×6): 6 [IU] via SUBCUTANEOUS

## 2018-05-10 MED ORDER — ACETAMINOPHEN 325 MG PO TABS: 650.0000 mg | ORAL_TABLET | Freq: Four times a day (QID) | ORAL | Status: DC | PRN

## 2018-05-10 MED ORDER — SODIUM CHLORIDE 0.9 % IV SOLN: 250.0000 mL | INTRAVENOUS | Status: DC | PRN

## 2018-05-10 MED ORDER — SODIUM CHLORIDE 0.9% FLUSH
3.0000 mL | Freq: Two times a day (BID) | INTRAVENOUS | Status: DC
  Administered 2018-05-10 – 2018-05-11 (×2): 3 mL via INTRAVENOUS

## 2018-05-10 MED ORDER — ASPIRIN EC 81 MG PO TBEC
81.0000 mg | DELAYED_RELEASE_TABLET | Freq: Every day | ORAL | Status: DC
  Administered 2018-05-10 – 2018-05-12 (×3): 81 mg via ORAL
  Filled 2018-05-10 (×3): qty 1

## 2018-05-10 MED ORDER — INSULIN ASPART 100 UNIT/ML ~~LOC~~ SOLN
0.0000 [IU] | Freq: Three times a day (TID) | SUBCUTANEOUS | Status: DC
  Administered 2018-05-10 – 2018-05-11 (×4): 11 [IU] via SUBCUTANEOUS
  Administered 2018-05-11: 7 [IU] via SUBCUTANEOUS
  Administered 2018-05-12: 4 [IU] via SUBCUTANEOUS

## 2018-05-10 MED ORDER — INSULIN ASPART 100 UNIT/ML ~~LOC~~ SOLN
0.0000 [IU] | Freq: Every day | SUBCUTANEOUS | Status: DC
  Administered 2018-05-10: 3 [IU] via SUBCUTANEOUS
  Administered 2018-05-11: 2 [IU] via SUBCUTANEOUS

## 2018-05-10 MED ORDER — POLYETHYLENE GLYCOL 3350 17 G PO PACK: 17.0000 g | PACK | Freq: Every day | ORAL | Status: DC | PRN

## 2018-05-10 NOTE — Progress Notes (Addendum)
ANTICOAGULATION CONSULT NOTE - Initial Consult  Pharmacy Consult for Heparin Indication: pulmonary embolus  Allergies  Allergen Reactions  . Contrast Media [Iodinated Diagnostic Agents] Itching    Pt states he thinks it makes him itch  . Heparin Itching    Itching over entire body  . Lovenox [Enoxaparin Sodium] Itching    Itching over entire body  . Percocet [Oxycodone-Acetaminophen] Hives and Itching    Patient Measurements: Height: 5\' 11"  (180.3 cm) Weight: (!) 304 lb 3.2 oz (138 kg) IBW/kg (Calculated) : 75.3 Heparin Dosing Weight: 109 kg  Vital Signs: Temp: 98 F (36.7 C) (09/12 0830) Temp Source: Oral (09/12 0830) BP: 120/85 (09/12 0830) Pulse Rate: 88 (09/12 0830)  Labs: Recent Labs    05/09/18 2324  HGB 14.4  HCT 41.7  PLT 249  CREATININE 1.00  TROPONINI 0.04*    Estimated Creatinine Clearance: 131.1 mL/min (by C-G formula based on SCr of 1 mg/dL).   Medical History: Past Medical History:  Diagnosis Date  . Anemia age 47 or 7   none since  . Arthritis    oa  . Diabetes mellitus without complication (HCC)    newly diagnosed  . GERD (gastroesophageal reflux disease)   . Head problem    back of top of head area swells at times, had glass in wound at times, pt instrcuted to follow up with md, if any pus seen  . Headache(784.0)    migraine  . Morbid obesity (HCC)   . PE (pulmonary embolism) 10/01/2014  . Prediabetes on 04-28-14   Assessment:  47 yr old [facility] for chest pain and SOB. D-dimer 9.26.  IV heparin begun at 12:15am at 1650 units/hr (12 units/kg total body weight/hr).  No bolus given. Hx PE in 2015 after knee athroscopy.   Planning CT angiogram  and dopplers.      Heparin is listed as an allergy, causing itching, but no itching since heparin begun last night.  Patient reported that IV heparin and contrast were given about the same time in 2015, and at that time they were unsure which caused the itching, but most likely was  contrast.   Prednisone 50 mg PO q6h x 3 and Benadryl 50 mg IV or PO x 1 to be given prior to CTA.  Goal of Therapy:  Heparin level 0.3-0.7 units/ml Monitor platelets by anticoagulation protocol: Yes   Plan:   Continue heparin drip at 1650 units/hr (~15 units/kg adjusted body weight/hr).  Heparin level this morning.  Daily heparin level and CBC while on heparin.  Follow up CTA and dopplers.   Dennie Fettersgan, Kamarii Buren Donovan, [place] Pager: [phone] or phone: [phone] 05/10/2018,9:18 AM   Addendum:   Heparin level is subtherapeutic (0.19) on 1650 units/hr.   Patient reports intermittent chest pain.  Troponin 0.32.   Plan 3000 units IV heparin bolus and increase infusion to 1950 units/hr.   Next heparin level ~6 hrs after increase.   CTA planned for tonight.  Hilarie Fredrickson. Sharika Mosquera, RPh 12:53 PM

## 2018-05-10 NOTE — Progress Notes (Signed)
CRITICAL VALUE ALERT  Critical Value: troponin 0.32  Date & Time Notied:  05/10/18 1000  Provider Notified: 05/10/18 1011  Orders Received/Actions taken: monitor

## 2018-05-10 NOTE — ED Notes (Signed)
Pt denies any allergy to any blood thinner, states the hospital started him on blood thinner right after he got IV contrast for CT and he got very itchy at that time, resolved with IV Benadryl, Dr. Nicanor AlconPalumbo notified and pt started on heparin gtt as ordered by Dr. Nicanor AlconPalumbo.

## 2018-05-10 NOTE — ED Notes (Signed)
Date and time results received: 05/10/18 0007 (use smartphrase ".now" to insert current time)  Test: troponin Critical Value: 0.04  Name of Provider Notified: Dr. Nicanor AlconPalumbo  Orders Received? Or Actions Taken?: no new orders

## 2018-05-10 NOTE — Progress Notes (Addendum)
*  Preliminary Results* Bilateral lower extremity venous duplex completed. The right lower extremity is positive for deep vein thrombosis involving the right saphenofemoral junction, common femoral, femoral, profunda femoral, popliteal, posterior tibial, and peroneal veins. The left lower extremity is negative for deep vein thrombosis. There is no evidence of Baker's cyst bilaterally.  Preliminary results discussed with Dr. David StallFeliz-Ortiz.  05/10/2018 3:10 PM Marcus Joseph, MHA, RVT, RDCS, RDMS
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
## 2018-05-10 NOTE — Progress Notes (Signed)
  Echocardiogram 2D Echocardiogram has been performed.  [name], [name] 05/10/2018, 11:23 AM

## 2018-05-10 NOTE — Progress Notes (Signed)
ANTICOAGULATION CONSULT NOTE -  Follow-Up Consult  Pharmacy Consult for Heparin Indication: pulmonary embolus  Allergies  Allergen Reactions  . Contrast Media [Iodinated Diagnostic Agents] Itching    Pt states he thinks it makes him itch  . Lovenox [Enoxaparin Sodium] Itching    Itching over entire body  . Percocet [Oxycodone-Acetaminophen] Hives and Itching    Patient Measurements: Height: 5\' 11"  (180.3 cm) Weight: (!) 304 lb 3.2 oz (138 kg) IBW/kg (Calculated) : 75.3 Heparin Dosing Weight: 109 kg  Vital Signs: Temp: 98.6 F (37 C) (09/12 2033) Temp Source: Oral (09/12 2033) BP: 121/85 (09/12 2033) Pulse Rate: 93 (09/12 2033)  Labs: Recent Labs    05/09/18 2324 05/10/18 0839 05/10/18 1018 05/10/18 1357 05/10/18 1916  HGB 14.4  --   --   --   --   HCT 41.7  --   --   --   --   PLT 249  --   --   --   --   HEPARINUNFRC  --   --  0.19*  --  0.67  CREATININE 1.00  --   --   --   --   TROPONINI 0.04* 0.32*  --  0.13*  --     Estimated Creatinine Clearance: 131.1 mL/min (by C-G formula based on SCr of 1 mg/dL).  Assessment: 47 yr [facility] for chest pain and SOB. Pharmacy consulted to dose Heparin for confirmed RLE DVT and r/o PE.   Heparin listed as an allergy - itching, patient tolerating thus far. Dopplers positive for RLE DVT, still awaiting CT angio.  Heparin level this evening is therapeutic after a rate increase earlier today (HL 0.67 << 0.19, goal of 0.3-0.7). No issues noted at this time.   Goal of Therapy:  Heparin level 0.3-0.7 units/ml Monitor platelets by anticoagulation protocol: Yes   Plan:  - Continue Heparin drip at 1950 units/hr (19.5 ml/hr) - Will continue to monitor for any signs/symptoms of bleeding and will follow up with heparin level in 6 hours to confirm therapeutic  Thank you for allowing pharmacy to be a part of this patient's care.  Georgina PillionElizabeth Cherylynn Liszewski, PharmD, BCPS Clinical Pharmacist Please check AMION for  all [facility] Pharmacy numbers 05/10/2018 9:10 PM
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
## 2018-05-11 DIAGNOSIS — R0789 Other chest pain: Principal | ICD-10-CM

## 2018-05-11 DIAGNOSIS — R0600 Dyspnea, unspecified: Secondary | ICD-10-CM

## 2018-05-11 DIAGNOSIS — R748 Abnormal levels of other serum enzymes: Secondary | ICD-10-CM

## 2018-05-11 LAB — BASIC METABOLIC PANEL
ANION GAP: 12 (ref 5–15)
BUN: 10 mg/dL (ref 6–20)
CHLORIDE: 101 mmol/L (ref 98–111)
CO2: 22 mmol/L (ref 22–32)
Calcium: 9 mg/dL (ref 8.9–10.3)
Creatinine, Ser: 1.06 mg/dL (ref 0.61–1.24)
Glucose, Bld: 267 mg/dL — ABNORMAL HIGH (ref 70–99)
Potassium: 4 mmol/L (ref 3.5–5.1)
SODIUM: 135 mmol/L (ref 135–145)

## 2018-05-11 LAB — GLUCOSE, CAPILLARY
GLUCOSE-CAPILLARY: 205 mg/dL — AB (ref 70–99)
Glucose-Capillary: 287 mg/dL — ABNORMAL HIGH (ref 70–99)
Glucose-Capillary: 227 mg/dL — ABNORMAL HIGH (ref 70–99)
Glucose-Capillary: 255 mg/dL — ABNORMAL HIGH (ref 70–99)

## 2018-05-11 LAB — CBC
HCT: 45.5 % (ref 39.0–52.0)
HEMOGLOBIN: 14.8 g/dL (ref 13.0–17.0)
MCH: 27.8 pg (ref 26.0–34.0)
MCHC: 32.5 g/dL (ref 30.0–36.0)
MCV: 85.4 fL (ref 78.0–100.0)
Platelets: 299 10*3/uL (ref 150–400)
RBC: 5.33 MIL/uL (ref 4.22–5.81)
RDW: 12.7 % (ref 11.5–15.5)
WBC: 9.3 10*3/uL (ref 4.0–10.5)

## 2018-05-11 LAB — HIV ANTIBODY (ROUTINE TESTING W REFLEX): HIV Screen 4th Generation wRfx: NONREACTIVE

## 2018-05-11 LAB — HEPARIN LEVEL (UNFRACTIONATED): Heparin Unfractionated: 0.63 IU/mL (ref 0.30–0.70)

## 2018-05-11 NOTE — Progress Notes (Signed)
PROGRESS NOTE    Marcus SpillerWilliam L Sallie Jr.  ZOX:[id number] DOB: 09-Jul-1971 DOA: 05/09/2018 PCP: System, Provider Not In    Brief Narrative:  47 y.o. male with medical history significant of PE in February 2015 probably secondary to arthroscopic knee surgery, now off anticoagulation only on a baby aspirin with GERD diabetes who comes in for for intermittent chest pain for 1 week which gradually got worse yesterday accompanied by shortness of breath.  He denies any fever, no hemoptysis no wheezing no PND no orthopnea no leg pain.  This is not made worse by food or by position, he describes it sometimes as pleuritic but not consistent.  It is relieved by nothing and exacerbated by nothing.  ED Course:  Came into the ED was found to have a glucose of 200 cardiac biomarkers 0.04 no leukocytosis d-dimer was 0.26 and a chest x-ray as below.  Assessment & Plan:   Active Problems:   OA (osteoarthritis) of knee   Elevated troponin   Type 2 diabetes mellitus with hyperglycemia (HCC)   Morbid obesity (HCC)   Diabetes mellitus type 2, uncontrolled (HCC)   Chest pain   Atypical chest pain  Atypical Chest pain secondary to acute PE with right heart strain: -CT chest positive for saddle PE with right heart strain -Vitals are stable. On minimal O2 support -LE dopplers pos for RLE DVT -Given degree of clot burden, will continue heparin for total of at least 48hrs (9am on 9/14 makes 48hrs of heparin gtt) -Will then plan to transition to NOAC afterwards  OA (osteoarthritis) of knee -Stable at present -Continuing with NSAID's as tolerated  Elevated troponin: -Likely secondary to above PE -Trop trending down -2d echo with findings suggestive of R heart strain.  Type 2 diabetes mellitus with hyperglycemia (HCC) -Metformin stopped -Patient now on lantus with SSI coverage as needed    Morbid obesity (HCC): -Recommend diet/lifestyle modification  DVT prophylaxis: Heparin gtt Code Status:  Full Family Communication: Pt in room,family not at bedside Disposition Plan: Possible home in 24hrs  Consultants:     Procedures:     Antimicrobials: Anti-infectives (From admission, onward)   None       Subjective: Denies sob. Eager to go home  Objective: Vitals:   05/10/18 1608 05/10/18 2033 05/11/18 0700 05/11/18 1241  BP: 109/80 121/85 101/71 117/83  Pulse: 92 93 85 78  Resp: 18 16 18 18   Temp: 98 F (36.7 C) 98.6 F (37 C) 97.9 F (36.6 C) 98.3 F (36.8 C)  TempSrc: Oral Oral Oral Oral  SpO2: (!) 89% 93% 94% 95%  Weight:   (!) 136.2 kg   Height:        Intake/Output Summary (Last 24 hours) at 05/11/2018 1450 Last data filed at 05/11/2018 1300 Gross per 24 hour  Intake 1680.88 ml  Output 700 ml  Net 980.88 ml   Filed Weights   05/09/18 2307 05/10/18 0210 05/11/18 0700  Weight: (!) 138 kg (!) 138 kg (!) 136.2 kg    Examination:  General exam: Appears calm and comfortable  Respiratory system: Clear to auscultation. Respiratory effort normal. Cardiovascular system: S1 & S2 heard, RRR.  Gastrointestinal system: Abdomen is nondistended, soft and nontender. No organomegaly or masses felt. Normal bowel sounds heard. Central nervous system: Alert and oriented. No focal neurological deficits. Extremities: Symmetric 5 x 5 power. Skin: No rashes, lesions Psychiatry: Judgement and insight appear normal. Mood & affect appropriate.   Data Reviewed: I have personally reviewed following labs and  imaging studies  CBC: Recent Labs  Lab 05/09/18 2324 05/11/18 0016  WBC 9.1 9.3  NEUTROABS 4.5  --   HGB 14.4 14.8  HCT 41.7 45.5  MCV 83.4 85.4  PLT 249 299   Basic Metabolic Panel: Recent Labs  Lab 05/09/18 2324 05/11/18 0016  NA 137 135  K 3.5 4.0  CL 102 101  CO2 24 22  GLUCOSE 204* 267*  BUN 11 10  CREATININE 1.00 1.06  CALCIUM 8.7* 9.0   GFR: Estimated Creatinine Clearance: 122.8 mL/min (by C-G formula based on SCr of 1.06 mg/dL). Liver  Function Tests: No results for input(s): AST, ALT, ALKPHOS, BILITOT, PROT, ALBUMIN in the last 168 hours. No results for input(s): LIPASE, AMYLASE in the last 168 hours. No results for input(s): AMMONIA in the last 168 hours. Coagulation Profile: No results for input(s): INR, PROTIME in the last 168 hours. Cardiac Enzymes: Recent Labs  Lab 05/09/18 2324 05/10/18 0839 05/10/18 1357 05/10/18 1916  TROPONINI 0.04* 0.32* 0.13* 0.09*   BNP (last 3 results) No results for input(s): PROBNP in the last 8760 hours. HbA1C: Recent Labs    05/10/18 0839  HGBA1C 11.2*   CBG: Recent Labs  Lab 05/10/18 1607 05/10/18 2137 05/10/18 2324 05/11/18 0810 05/11/18 1136  GLUCAP 270* 265* 252* 287* 227*   Lipid Profile: No results for input(s): CHOL, HDL, LDLCALC, TRIG, CHOLHDL, LDLDIRECT in the last 72 hours. Thyroid Function Tests: No results for input(s): TSH, T4TOTAL, FREET4, T3FREE, THYROIDAB in the last 72 hours. Anemia Panel: No results for input(s): VITAMINB12, FOLATE, FERRITIN, TIBC, IRON, RETICCTPCT in the last 72 hours. Sepsis Labs: No results for input(s): PROCALCITON, LATICACIDVEN in the last 168 hours.  No results found for this or any previous visit (from the past 240 hour(s)).   Radiology Studies: Dg Chest 2 View  Result Date: 05/10/2018 CLINICAL DATA:  Midsternal chest pain.  Shortness of breath. EXAM: CHEST - 2 VIEW COMPARISON:  Radiographs and CT September 2017 FINDINGS: The cardiomediastinal contours are normal. Pulmonary vasculature is normal. No consolidation, pleural effusion, or pneumothorax. No acute osseous abnormalities are seen. IMPRESSION: No acute findings. Electronically Signed   By: Narda Rutherford M.D.   On: 05/10/2018 00:23   Ct Angio Chest Pe W Or Wo Contrast  Result Date: 05/10/2018 CLINICAL DATA:  Chest pain and shortness of breath. Patient reports pulmonary embolus 4 years ago. Chest pain, PE suspected, low/intermediate prob, neg D-dimer EXAM: CT  ANGIOGRAPHY CHEST WITH CONTRAST TECHNIQUE: Multidetector CT imaging of the chest was performed using the standard protocol during bolus administration of intravenous contrast. Multiplanar CT image reconstructions and MIPs were obtained to evaluate the vascular anatomy. CONTRAST:  54mL ISOVUE-370 IOPAMIDOL (ISOVUE-370) INJECTION 76% COMPARISON:  Radiograph yesterday.  Most recent chest CT 05/22/2016. 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 Thin subtle embolus with filling defects extending into all lobes of both lungs, multiple segmental and subsegmental branches. There is right heart strain with RV to LV ratio of 1.07. Thoracic aorta is normal in caliber without dissection. Borderline cardiomegaly. Mediastinum/Nodes: No enlarged mediastinal or hilar lymph nodes. No thyroid nodule. Esophagus is nondistended. Lungs/Pleura: No pulmonary infarct or focal airspace disease. No pulmonary edema or pleural effusion. The trachea and mainstem bronchi are patent. Upper Abdomen: Enlarged liver with steatosis, chronic. No acute findings. Musculoskeletal: There are no acute or suspicious osseous abnormalities. Review of the MIP images confirms the above findings. IMPRESSION: 1. Positive for acute PE with large thromboembolic burden and saddle embolus,  with CT evidence of right heart strain (RV/LV Ratio = 1.07) consistent with at least submassive (intermediate risk) PE. The presence of right heart strain has been associated with an increased risk of morbidity and mortality. Please activate Code PE by paging [phone]. 2. No pulmonary infarct. Critical Value/emergent results were called by telephone at the time of interpretation on 05/10/2018 at 11:34 pm to NP [facility], who verbally acknowledged these results. Electronically Signed   By: Narda Rutherford M.D.   On: 05/10/2018 23:35    Scheduled Meds: . aspirin EC  81 mg Oral Daily  . insulin aspart  0-20 Units Subcutaneous  TID WC  . insulin aspart  0-5 Units Subcutaneous QHS  . insulin aspart  6 Units Subcutaneous TID WC  . insulin detemir  20 Units Subcutaneous BID  . pantoprazole  40 mg Oral Daily  . sodium chloride flush  3 mL Intravenous Q12H   Continuous Infusions: . sodium chloride    . heparin 1,950 Units/hr (05/11/18 0134)     LOS: 1 day   Rickey Barbara, MD [facility] Pager On Amion  If 7PM-7AM, please contact night-coverage 05/11/2018, 2:50 PM

## 2018-05-11 NOTE — Care Management (Signed)
05-11-18  BENEFITS CHECK :  #  13.   S/W ISILA   @ [employer] # [id number]   1. XARELTO  15 MG BID COVER- YES CO-PAY- $ 20.00 TIER- 2 DRUG PRIOR APPROVAL- NO  2. ELIQUIS  5 MG BID COVER- YES CO-PAY- $ 20.00 TIER- 2 DRUG PRIOR APPROVAL- NO  PREFERRED  PHARMACY :  YES   [facility]

## 2018-05-11 NOTE — Progress Notes (Signed)
ANTICOAGULATION CONSULT NOTE  Pharmacy Consult for Heparin Indication: pulmonary embolus  Allergies  Allergen Reactions  . Contrast Media [Iodinated Diagnostic Agents] Itching    Pt states he thinks it makes him itch  . Lovenox [Enoxaparin Sodium] Itching    Itching over entire body  . Percocet [Oxycodone-Acetaminophen] Hives and Itching    Patient Measurements: Height: 5\' 11"  (180.3 cm) Weight: (!) 304 lb 3.2 oz (138 kg) IBW/kg (Calculated) : 75.3 Heparin Dosing Weight: 109 kg  Vital Signs: Temp: 98.6 F (37 C) (09/12 2033) Temp Source: Oral (09/12 2033) BP: 121/85 (09/12 2033) Pulse Rate: 93 (09/12 2033)  Labs: Recent Labs    05/09/18 2324 05/10/18 0839 05/10/18 1018 05/10/18 1357 05/10/18 1916 05/11/18 0016  HGB 14.4  --   --   --   --  14.8  HCT 41.7  --   --   --   --  45.5  PLT 249  --   --   --   --  299  HEPARINUNFRC  --   --  0.19*  --  0.67 0.63  CREATININE 1.00  --   --   --   --  1.06  TROPONINI 0.04* 0.32*  --  0.13* 0.09*  --     Estimated Creatinine Clearance: 123.7 mL/min (by C-G formula based on SCr of 1.06 mg/dL).  Assessment: 47 y.o. male with PE for heparin  Goal of Therapy:  Heparin level 0.3-0.7 units/ml Monitor platelets by anticoagulation protocol: Yes   Plan:  Continue Heparin at current rate   Geannie RisenGreg Maks Cavallero, PharmD, BCPS  05/11/2018 2:50 AM

## 2018-05-11 NOTE — Progress Notes (Addendum)
Inpatient Diabetes Program Recommendations  AACE/ADA: New Consensus Statement on Inpatient Glycemic Control (2015)  Target Ranges:  Prepandial:   less than 140 mg/dL      Peak postprandial:   less than 180 mg/dL (1-2 hours)      Critically ill patients:  140 - 180 mg/dL   Lab Results  Component Value Date   GLUCAP 287 (H) 05/11/2018   HGBA1C 11.2 (H) 05/10/2018    Review of Glycemic Control Results for Marcus Joseph, Marcus L JR. (MRN [id number]) as of 05/11/2018 10:32  Ref. Range 05/10/2018 16:07 05/10/2018 21:37 05/10/2018 23:24 05/11/2018 08:10  Glucose-Capillary Latest Ref Range: 70 - 99 mg/dL 409270 (H) 811265 (H) 914252 (H) 287 (H)   Diabetes history: Type 2 DM Outpatient Diabetes medications: Metformin 500 mg BID Current orders for Inpatient glycemic control: Levemir 20 units BID, Novolog 6 units TID, Novolog 0-20 units TID, Novolog 0-5 units QHS  Inpatient Diabetes Program Recommendations:    Noted Prednisone discontinued. Given FSBS of 287 mg/dL, consider increasing Levemir to 24 units BID.  Will plan to see patient today.   Thanks, Lujean RaveLauren Ayanna Gheen, MSN, RNC-OB Diabetes Coordinator [phone] (8a-5p)  Addendum@ 1330: Spoke with patient regarding outpatient diabetes control. Patient was seen by PCP last Friday and was started on Xultophy 100/3.6 QD. Additionally, patient had a [employer]estyle Libre that was applied and he has been using this to help with monitoring.   Reviewed patient's current A1c of 11.2%. Explained what a A1c is and what it measures. Also reviewed goal A1c with patient, importance of good glucose control @ home, and blood sugar goals. Reviewed patho of DM, need for  Insulin, role of pancreas, vascular changes that occur, and comorbidites associated.   Patient also has meter and other supplies in the event the Marcus IgoLibre is removed. We discussed the Freestyle and how to use this as a tool to follow eating patterns, and insulin administration.  Patient is not interested in  outpatient education, however, plans to meet with a diabetes educator again at his PCP office. He plans to continue to follow BS until next PCP visit. Discussed the impact of activity and how this could increase insulin resistance.  Encouraged to keep an open mind for further plans with diabetes and ensure he follow up. Not interested in seeing endocrinologist at this time.   Thanks, Lujean RaveLauren Malikai Gut, MSN, RNC-OB Diabetes Coordinator [phone] (8a-5p)

## 2018-05-12 DIAGNOSIS — R0789 Other chest pain: Secondary | ICD-10-CM | POA: Diagnosis not present

## 2018-05-12 DIAGNOSIS — I2602 Saddle embolus of pulmonary artery with acute cor pulmonale: Secondary | ICD-10-CM | POA: Diagnosis not present

## 2018-05-12 DIAGNOSIS — R748 Abnormal levels of other serum enzymes: Secondary | ICD-10-CM | POA: Diagnosis not present

## 2018-05-12 LAB — CBC
HCT: 41.1 % (ref 39.0–52.0)
HEMOGLOBIN: 13.4 g/dL (ref 13.0–17.0)
MCH: 28.3 pg (ref 26.0–34.0)
MCHC: 32.6 g/dL (ref 30.0–36.0)
MCV: 86.9 fL (ref 78.0–100.0)
Platelets: 271 10*3/uL (ref 150–400)
RBC: 4.73 MIL/uL (ref 4.22–5.81)
RDW: 13.1 % (ref 11.5–15.5)
WBC: 12.5 10*3/uL — AB (ref 4.0–10.5)

## 2018-05-12 LAB — GLUCOSE, CAPILLARY: GLUCOSE-CAPILLARY: 165 mg/dL — AB (ref 70–99)

## 2018-05-12 LAB — HEPARIN LEVEL (UNFRACTIONATED): HEPARIN UNFRACTIONATED: 0.86 [IU]/mL — AB (ref 0.30–0.70)

## 2018-05-12 MED ORDER — INSULIN ASPART 100 UNIT/ML FLEXPEN: 6.0000 [IU] | PEN_INJECTOR | Freq: Three times a day (TID) | SUBCUTANEOUS | 0 refills | Status: DC

## 2018-05-12 MED ORDER — INSULIN PEN NEEDLE 32G X 4 MM MISC: 1.0000 | Freq: Four times a day (QID) | 0 refills | Status: AC

## 2018-05-12 MED ORDER — RIVAROXABAN 15 MG PO TABS
15.0000 mg | ORAL_TABLET | Freq: Two times a day (BID) | ORAL | Status: DC
  Administered 2018-05-12: 15 mg via ORAL
  Filled 2018-05-12: qty 1

## 2018-05-12 MED ORDER — RIVAROXABAN (XARELTO) VTE STARTER PACK (15 & 20 MG): ORAL_TABLET | ORAL | 0 refills | Status: DC

## 2018-05-12 MED ORDER — INSULIN GLARGINE 100 UNIT/ML SOLOSTAR PEN: 20.0000 [IU] | PEN_INJECTOR | Freq: Every day | SUBCUTANEOUS | 0 refills | Status: DC

## 2018-05-12 NOTE — Discharge Instructions (Signed)
Information on my medicine - XARELTO (rivaroxaban)  This medication education was reviewed with me or my healthcare representative as part of my discharge preparation.  WHY WAS XARELTO PRESCRIBED FOR YOU? Xarelto was prescribed to treat blood clots that may have been found in the veins of your legs (deep vein thrombosis) or in your lungs (pulmonary embolism) and to reduce the risk of them occurring again.  What do you need to know about Xarelto? The starting dose is one 15 mg tablet taken TWICE daily with food for the FIRST 21 DAYS then on 06/02/18 the dose is changed to one 20 mg tablet taken ONCE A DAY with your evening meal.  DO NOT stop taking Xarelto without talking to the health care provider who prescribed the medication.  Refill your prescription for 20 mg tablets before you run out.  After discharge, you should have regular check-up appointments with your healthcare provider that is prescribing your Xarelto.  In the future your dose may need to be changed if your kidney function changes by a significant amount.  What do you do if you miss a dose? If you are taking Xarelto TWICE DAILY and you miss a dose, take it as soon as you remember. You may take two 15 mg tablets (total 30 mg) at the same time then resume your regularly scheduled 15 mg twice daily the next day.  If you are taking Xarelto ONCE DAILY and you miss a dose, take it as soon as you remember on the same day then continue your regularly scheduled once daily regimen the next day. Do not take two doses of Xarelto at the same time.   Important Safety Information Xarelto is a blood thinner medicine that can cause bleeding. You should call your healthcare provider right away if you experience any of the following: ? Bleeding from an injury or your nose that does not stop. ? Unusual colored urine (red or dark brown) or unusual colored stools (red or black). ? Unusual bruising for unknown reasons. ? A serious fall or if  you hit your head (even if there is no bleeding).  Some medicines may interact with Xarelto and might increase your risk of bleeding while on Xarelto. To help avoid this, consult your healthcare provider or pharmacist prior to using any new prescription or non-prescription medications, including herbals, vitamins, non-steroidal anti-inflammatory drugs (NSAIDs) and supplements.  This website has more information on Xarelto: [email or web address].xarelto.[email or web address].

## 2018-05-12 NOTE — Progress Notes (Addendum)
ANTICOAGULATION CONSULT NOTE  Pharmacy Consult for Heparin/Xarelto Indication: pulmonary embolus  Allergies  Allergen Reactions  . Contrast Media [Iodinated Diagnostic Agents] Itching    Pt states he thinks it makes him itch  . Lovenox [Enoxaparin Sodium] Itching    Itching over entire body  . Percocet [Oxycodone-Acetaminophen] Hives and Itching    Patient Measurements: Height: 5\' 11"  (180.3 cm) Weight: (!) 302 lb 6.4 oz (137.2 kg) IBW/kg (Calculated) : 75.3 Heparin Dosing Weight: 109 kg  Vital Signs: Temp: 98.3 F (36.8 C) (09/14 0633) Temp Source: Oral (09/14 40980633) BP: 121/79 (09/14 11910633) Pulse Rate: 87 (09/14 0633)  Labs: Recent Labs    05/09/18 2324 05/10/18 0839  05/10/18 1357 05/10/18 1916 05/11/18 0016 05/12/18 0600  HGB 14.4  --   --   --   --  14.8 13.4  HCT 41.7  --   --   --   --  45.5 41.1  PLT 249  --   --   --   --  299 271  HEPARINUNFRC  --   --    < >  --  0.67 0.63 0.86*  CREATININE 1.00  --   --   --   --  1.06  --   TROPONINI 0.04* 0.32*  --  0.13* 0.09*  --   --    < > = values in this interval not displayed.    Estimated Creatinine Clearance: 123.3 mL/min (by C-G formula based on SCr of 1.06 mg/dL).  Assessment: 47 y.o. male with PE on heparin. Heparin level supratherapeutic this morning at 0.86. CBC stable and no s/sx of bleeding noted.   Addendum: Will d/c heparin now and start Xarelto.    Plan:  -Stop heparin  drip -Start Xarelto 15 mg PO BID with meals -Monitor s/sx of bleeding  Arvilla MarketMelissa Bernd Crom, PharmD PGY1 Pharmacy Resident Phone [phone] 05/12/2018     9:00 AM

## 2018-05-12 NOTE — Discharge Summary (Signed)
Physician Discharge Summary  Levonne SpillerWilliam L Milham Jr. ZOX:[id number] DOB: [birth year] DOA: 05/09/2018  PCP: System, Provider Not In  Admit date: 05/09/2018 Discharge date: 05/12/2018  Admitted From: Home Disposition:  Home  Recommendations for Outpatient Follow-up:  1. Follow up with PCP in 1-2 weeks 2. Recommend repeat CBC in 1-2 weeks, to be followed up by PCP 3. Consider outpatient referral to Hematology 4. Follow up with Endocrine as scheduled    Discharge Condition:Stable CODE STATUS:Full Diet recommendation: Diabetic   Brief/Interim Summary: 47 y.o.malewith medical history significant ofPE in February 2015 probably secondary to arthroscopic knee surgery, now off anticoagulation only on a baby aspirin with GERD diabetes who comes in for for intermittent chest pain for 1 week which gradually got worse yesterday accompanied by shortness of breath. He denies any fever, no hemoptysis no wheezing no PND no orthopnea no leg pain. This is not made worse by food or by position, he describes it sometimes as pleuritic but not consistent. It is relieved by nothing and exacerbated by nothing.  ED Course: Came into the ED was found to have a glucose of 200 cardiac biomarkers 0.04 no leukocytosis d-dimer was 0.26  AtypicalChest pain secondary to acute PE with right heart strain: -CT chest positive for saddle PE with right heart strain -Vitals are stable. On minimal O2 support -LE dopplers pos for RLE DVT -Given degree of clot burden, patient was continued on heparin for total of at least 48hrs -Patient subsequently transitioned to Xarelto on discharge -Consider follow up with Hematologist as outpatient  OA (osteoarthritis) of knee -Stable at present -Continuing with NSAID's as tolerated  Elevated troponin: -Likely secondary to above PE -Trop trending down -2d echo with findings suggestive of R heart strain.  Type 2 diabetes mellitus with hyperglycemia (HCC) -Metformin  stopped -Patient now on lantus with meal coverage -continued on SSI coverage as needed while in hospital  Morbid obesity [facility](HCC): -Recommend diet/lifestyle modification   Discharge Diagnoses:  Active Problems:   OA (osteoarthritis) of knee   Elevated troponin   Type 2 diabetes mellitus with hyperglycemia (HCC)   Morbid obesity (HCC)   Diabetes mellitus type 2, uncontrolled (HCC)   Chest pain   Atypical chest pain    Discharge Instructions   Allergies as of 05/12/2018      Reactions   Contrast Media [iodinated Diagnostic Agents] Itching   Pt states he thinks it makes him itch   Lovenox [enoxaparin Sodium] Itching   Itching over entire body   Percocet [oxycodone-acetaminophen] Hives, Itching      Medication List    STOP taking these medications   ibuprofen 200 MG tablet Commonly known as:  ADVIL,MOTRIN   metFORMIN 1000 MG tablet Commonly known as:  GLUCOPHAGE   metFORMIN 500 MG tablet Commonly known as:  GLUCOPHAGE   XULTOPHY 100-3.6 UNIT-MG/ML Sopn Generic drug:  Insulin Degludec-Liraglutide     TAKE these medications   aspirin EC 81 MG tablet Take 1 tablet (81 mg total) by mouth daily.   insulin aspart 100 UNIT/ML FlexPen Commonly known as:  NOVOLOG Inject 6 Units into the skin 3 (three) times daily with meals.   Insulin Glargine 100 UNIT/ML Solostar Pen Commonly known as:  LANTUS Inject 20 Units into the skin daily.   Insulin Pen Needle 32G X 4 MM Misc 1 Device by Does not apply route 4 (four) times daily. For use with insulin pens   pantoprazole 40 MG tablet Commonly known as:  PROTONIX Take 1 tablet (40 mg total)  by mouth daily.   Rivaroxaban 15 & 20 MG Tbpk Take as directed on package: Start with one 15mg  tablet by mouth twice a day with food. On Day 22, switch to one 20mg  tablet once a day with food.      Follow-up Information    Follow up with your PCPin 1-2 weeks. Schedule an appointment as soon as possible for a visit in 2 week(s).           Allergies  Allergen Reactions  . Contrast Media [Iodinated Diagnostic Agents] Itching    Pt states he thinks it makes him itch  . Lovenox [Enoxaparin Sodium] Itching    Itching over entire body  . Percocet [Oxycodone-Acetaminophen] Hives and Itching    Procedures/Studies: Dg Chest 2 View  Result Date: 05/10/2018 CLINICAL DATA:  Midsternal chest pain.  Shortness of breath. EXAM: CHEST - 2 VIEW COMPARISON:  Radiographs and CT September 2017 FINDINGS: The cardiomediastinal contours are normal. Pulmonary vasculature is normal. No consolidation, pleural effusion, or pneumothorax. No acute osseous abnormalities are seen. IMPRESSION: No acute findings. Electronically Signed   By: Narda Rutherford M.D.   On: 05/10/2018 00:23   Ct Angio Chest Pe W Or Wo Contrast  Result Date: 05/10/2018 CLINICAL DATA:  Chest pain and shortness of breath. Patient reports pulmonary embolus 4 years ago. Chest pain, PE suspected, low/intermediate prob, neg D-dimer EXAM: CT ANGIOGRAPHY CHEST WITH CONTRAST TECHNIQUE: Multidetector CT imaging of the chest was performed using the standard protocol during bolus administration of intravenous contrast. Multiplanar CT image reconstructions and MIPs were obtained to evaluate the vascular anatomy. CONTRAST:  54mL ISOVUE-370 IOPAMIDOL (ISOVUE-370) INJECTION 76% COMPARISON:  Radiograph yesterday.  Most recent chest CT 05/22/2016. 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 Thin subtle embolus with filling defects extending into all lobes of both lungs, multiple segmental and subsegmental branches. There is right heart strain with RV to LV ratio of 1.07. Thoracic aorta is normal in caliber without dissection. Borderline cardiomegaly. Mediastinum/Nodes: No enlarged mediastinal or hilar lymph nodes. No thyroid nodule. Esophagus is nondistended. Lungs/Pleura: No pulmonary infarct or focal airspace disease. No pulmonary edema or pleural  effusion. The trachea and mainstem bronchi are patent. Upper Abdomen: Enlarged liver with steatosis, chronic. No acute findings. Musculoskeletal: There are no acute or suspicious osseous abnormalities. Review of the MIP images confirms the above findings. IMPRESSION: 1. Positive for acute PE with large thromboembolic burden and saddle embolus, with CT evidence of right heart strain (RV/LV Ratio = 1.07) consistent with at least submassive (intermediate risk) PE. The presence of right heart strain has been associated with an increased risk of morbidity and mortality. Please activate Code PE by paging [phone]. 2. No pulmonary infarct. Critical Value/emergent results were called by telephone at the time of interpretation on 05/10/2018 at 11:34 pm to NP [facility], who verbally acknowledged these results. Electronically Signed   By: Narda Rutherford M.D.   On: 05/10/2018 23:35    Subjective: Feels well. Eager to go home. Not short of breath  Discharge Exam: Vitals:   05/11/18 2000 05/12/18 0633  BP: 122/84 121/79  Pulse: 79 87  Resp: 18 18  Temp: 97.7 F (36.5 C) 98.3 F (36.8 C)  SpO2: 98% 97%   Vitals:   05/11/18 1241 05/11/18 1717 05/11/18 2000 05/12/18 0633  BP: 117/83 126/78 122/84 121/79  Pulse: 78 70 79 87  Resp: 18 17 18 18   Temp: 98.3 F (36.8 C) 97.8 F (36.6 C) 97.7 F (36.5 C)  98.3 F (36.8 C)  TempSrc: Oral Oral Oral Oral  SpO2: 95% 99% 98% 97%  Weight:    (!) 137.2 kg  Height:        General: Pt is alert, awake, not in acute distress Cardiovascular: RRR, S1/S2 +, no rubs, no gallops Respiratory: CTA bilaterally, no wheezing, no rhonchi Abdominal: Soft, NT, ND, bowel sounds + Extremities: no edema, no cyanosis   The results of significant diagnostics from this hospitalization (including imaging, microbiology, ancillary and laboratory) are listed below for reference.     Microbiology: No results found for this or any previous visit (from the past 240 hour(s)).    Labs: BNP (last 3 results) No results for input(s): BNP in the last 8760 hours. Basic Metabolic Panel: Recent Labs  Lab 05/09/18 2324 05/11/18 0016  NA 137 135  K 3.5 4.0  CL 102 101  CO2 24 22  GLUCOSE 204* 267*  BUN 11 10  CREATININE 1.00 1.06  CALCIUM 8.7* 9.0   Liver Function Tests: No results for input(s): AST, ALT, ALKPHOS, BILITOT, PROT, ALBUMIN in the last 168 hours. No results for input(s): LIPASE, AMYLASE in the last 168 hours. No results for input(s): AMMONIA in the last 168 hours. CBC: Recent Labs  Lab 05/09/18 2324 05/11/18 0016 05/12/18 0600  WBC 9.1 9.3 12.5*  NEUTROABS 4.5  --   --   HGB 14.4 14.8 13.4  HCT 41.7 45.5 41.1  MCV 83.4 85.4 86.9  PLT 249 299 271   Cardiac Enzymes: Recent Labs  Lab 05/09/18 2324 05/10/18 0839 05/10/18 1357 05/10/18 1916  TROPONINI 0.04* 0.32* 0.13* 0.09*   BNP: Invalid input(s): POCBNP CBG: Recent Labs  Lab 05/11/18 0810 05/11/18 1136 05/11/18 1638 05/11/18 2105 05/12/18 0751  GLUCAP 287* 227* 255* 205* 165*   D-Dimer Recent Labs    05/09/18 2324  DDIMER 9.26*   Hgb A1c Recent Labs    05/10/18 0839  HGBA1C 11.2*   Lipid Profile No results for input(s): CHOL, HDL, LDLCALC, TRIG, CHOLHDL, LDLDIRECT in the last 72 hours. Thyroid function studies No results for input(s): TSH, T4TOTAL, T3FREE, THYROIDAB in the last 72 hours.  Invalid input(s): FREET3 Anemia work up No results for input(s): VITAMINB12, FOLATE, FERRITIN, TIBC, IRON, RETICCTPCT in the last 72 hours. Urinalysis    Component Value Date/Time   COLORURINE YELLOW 05/31/2015 0250   APPEARANCEUR CLEAR 05/31/2015 0250   LABSPEC 1.041 (H) 05/31/2015 0250   PHURINE 5.5 05/31/2015 0250   GLUCOSEU >1000 (A) 05/31/2015 0250   HGBUR MODERATE (A) 05/31/2015 0250   BILIRUBINUR NEGATIVE 05/31/2015 0250   KETONESUR NEGATIVE 05/31/2015 0250   PROTEINUR NEGATIVE 05/31/2015 0250   UROBILINOGEN 0.2 05/31/2015 0250   NITRITE NEGATIVE 05/31/2015  0250   LEUKOCYTESUR NEGATIVE 05/31/2015 0250   Sepsis Labs Invalid input(s): PROCALCITONIN,  WBC,  LACTICIDVEN Microbiology No results found for this or any previous visit (from the past 240 hour(s)).  Time spent:  SIGNED:   Rickey Barbara, MD  [facility] 05/12/2018, 10:05 AM  If 7PM-7AM, please contact night-coverage
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
## 2018-05-29 ENCOUNTER — Emergency Department (HOSPITAL_COMMUNITY): Payer: BLUE CROSS/BLUE SHIELD

## 2018-05-29 ENCOUNTER — Encounter (HOSPITAL_COMMUNITY): Payer: Self-pay | Admitting: Emergency Medicine

## 2018-05-29 ENCOUNTER — Other Ambulatory Visit: Payer: Self-pay

## 2018-05-29 ENCOUNTER — Emergency Department (HOSPITAL_COMMUNITY)
Admission: EM | Admit: 2018-05-29 | Discharge: 2018-05-29 | Disposition: A | Discharge status: Home / Self Care | Payer: BLUE CROSS/BLUE SHIELD | Attending: Emergency Medicine | Admitting: Emergency Medicine

## 2018-05-29 DIAGNOSIS — Z794 Long term (current) use of insulin: Secondary | ICD-10-CM | POA: Diagnosis not present

## 2018-05-29 DIAGNOSIS — S39012A Strain of muscle, fascia and tendon of lower back, initial encounter: Secondary | ICD-10-CM | POA: Diagnosis not present

## 2018-05-29 DIAGNOSIS — R51 Headache: Secondary | ICD-10-CM | POA: Diagnosis not present

## 2018-05-29 DIAGNOSIS — Y92481 Parking lot as the place of occurrence of the external cause: Secondary | ICD-10-CM | POA: Diagnosis not present

## 2018-05-29 DIAGNOSIS — Z79899 Other long term (current) drug therapy: Secondary | ICD-10-CM | POA: Diagnosis not present

## 2018-05-29 DIAGNOSIS — Z7901 Long term (current) use of anticoagulants: Secondary | ICD-10-CM | POA: Diagnosis not present

## 2018-05-29 DIAGNOSIS — E119 Type 2 diabetes mellitus without complications: Secondary | ICD-10-CM | POA: Insufficient documentation

## 2018-05-29 DIAGNOSIS — Z86711 Personal history of pulmonary embolism: Secondary | ICD-10-CM | POA: Insufficient documentation

## 2018-05-29 DIAGNOSIS — S3992XA Unspecified injury of lower back, initial encounter: Secondary | ICD-10-CM | POA: Diagnosis present

## 2018-05-29 DIAGNOSIS — Y999 Unspecified external cause status: Secondary | ICD-10-CM | POA: Diagnosis not present

## 2018-05-29 DIAGNOSIS — M542 Cervicalgia: Secondary | ICD-10-CM

## 2018-05-29 DIAGNOSIS — Y9389 Activity, other specified: Secondary | ICD-10-CM | POA: Insufficient documentation

## 2018-05-29 MED ORDER — METHOCARBAMOL 500 MG PO TABS: 500.0000 mg | ORAL_TABLET | Freq: Two times a day (BID) | ORAL | 0 refills | Status: DC
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
## 2018-05-29 NOTE — ED Notes (Signed)
Ambulated pt in hallway O2 sats were maintained at 99-100.  
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
## 2018-05-29 NOTE — ED Notes (Signed)
Declined W/C at D/C and was escorted to lobby by RN. 

## 2018-05-30 NOTE — ED Provider Notes (Signed)
Asked by ED administrative assistant to call the patient's pharmacy, [facility] on [address], because they were unable to locate the ED prescription of Robaxin.  The prescription of Robaxin, 500 mg twice daily, 20 tablets was given verbally to neck, the pharmacist at this location.   Barkley Boards, PA-C 05/30/18 1913    Sabas Sous, MD 05/30/18 2028

## 2018-08-17 ENCOUNTER — Other Ambulatory Visit: Payer: Self-pay

## 2018-08-17 ENCOUNTER — Emergency Department (HOSPITAL_COMMUNITY): Payer: Self-pay

## 2018-08-17 ENCOUNTER — Inpatient Hospital Stay (HOSPITAL_COMMUNITY)
Admission: EM | Admit: 2018-08-17 | Discharge: 2018-09-02 | DRG: 228 | Disposition: A | Discharge status: Home / Self Care | Payer: Self-pay | Attending: Cardiothoracic Surgery | Admitting: Cardiothoracic Surgery

## 2018-08-17 ENCOUNTER — Encounter (HOSPITAL_COMMUNITY): Payer: Self-pay | Admitting: Emergency Medicine

## 2018-08-17 DIAGNOSIS — I252 Old myocardial infarction: Secondary | ICD-10-CM

## 2018-08-17 DIAGNOSIS — E8770 Fluid overload, unspecified: Secondary | ICD-10-CM | POA: Diagnosis present

## 2018-08-17 DIAGNOSIS — Z599 Problem related to housing and economic circumstances, unspecified: Secondary | ICD-10-CM

## 2018-08-17 DIAGNOSIS — I513 Intracardiac thrombosis, not elsewhere classified: Principal | ICD-10-CM | POA: Diagnosis present

## 2018-08-17 DIAGNOSIS — Z86718 Personal history of other venous thrombosis and embolism: Secondary | ICD-10-CM

## 2018-08-17 DIAGNOSIS — I2510 Atherosclerotic heart disease of native coronary artery without angina pectoris: Secondary | ICD-10-CM | POA: Diagnosis present

## 2018-08-17 DIAGNOSIS — G629 Polyneuropathy, unspecified: Secondary | ICD-10-CM | POA: Diagnosis present

## 2018-08-17 DIAGNOSIS — E119 Type 2 diabetes mellitus without complications: Secondary | ICD-10-CM | POA: Diagnosis present

## 2018-08-17 DIAGNOSIS — K219 Gastro-esophageal reflux disease without esophagitis: Secondary | ICD-10-CM | POA: Diagnosis present

## 2018-08-17 DIAGNOSIS — Z6841 Body Mass Index (BMI) 40.0 and over, adult: Secondary | ICD-10-CM

## 2018-08-17 DIAGNOSIS — Z794 Long term (current) use of insulin: Secondary | ICD-10-CM

## 2018-08-17 DIAGNOSIS — I2699 Other pulmonary embolism without acute cor pulmonale: Secondary | ICD-10-CM

## 2018-08-17 DIAGNOSIS — Z91041 Radiographic dye allergy status: Secondary | ICD-10-CM

## 2018-08-17 DIAGNOSIS — Z885 Allergy status to narcotic agent status: Secondary | ICD-10-CM

## 2018-08-17 DIAGNOSIS — Z7901 Long term (current) use of anticoagulants: Secondary | ICD-10-CM

## 2018-08-17 DIAGNOSIS — Z951 Presence of aortocoronary bypass graft: Secondary | ICD-10-CM

## 2018-08-17 DIAGNOSIS — Z888 Allergy status to other drugs, medicaments and biological substances status: Secondary | ICD-10-CM

## 2018-08-17 DIAGNOSIS — J189 Pneumonia, unspecified organism: Secondary | ICD-10-CM | POA: Diagnosis present

## 2018-08-17 DIAGNOSIS — I998 Other disorder of circulatory system: Secondary | ICD-10-CM | POA: Diagnosis present

## 2018-08-17 DIAGNOSIS — D62 Acute posthemorrhagic anemia: Secondary | ICD-10-CM | POA: Diagnosis not present

## 2018-08-17 DIAGNOSIS — I240 Acute coronary thrombosis not resulting in myocardial infarction: Secondary | ICD-10-CM | POA: Diagnosis present

## 2018-08-17 DIAGNOSIS — K769 Liver disease, unspecified: Secondary | ICD-10-CM | POA: Diagnosis present

## 2018-08-17 DIAGNOSIS — Z86711 Personal history of pulmonary embolism: Secondary | ICD-10-CM

## 2018-08-17 DIAGNOSIS — Z79899 Other long term (current) drug therapy: Secondary | ICD-10-CM

## 2018-08-17 DIAGNOSIS — Z96653 Presence of artificial knee joint, bilateral: Secondary | ICD-10-CM | POA: Diagnosis present

## 2018-08-17 DIAGNOSIS — E1165 Type 2 diabetes mellitus with hyperglycemia: Secondary | ICD-10-CM | POA: Diagnosis present

## 2018-08-17 DIAGNOSIS — I255 Ischemic cardiomyopathy: Secondary | ICD-10-CM | POA: Diagnosis present

## 2018-08-17 DIAGNOSIS — J9811 Atelectasis: Secondary | ICD-10-CM | POA: Diagnosis present

## 2018-08-17 DIAGNOSIS — Z8249 Family history of ischemic heart disease and other diseases of the circulatory system: Secondary | ICD-10-CM

## 2018-08-17 DIAGNOSIS — I2582 Chronic total occlusion of coronary artery: Secondary | ICD-10-CM | POA: Diagnosis present

## 2018-08-17 DIAGNOSIS — Z7982 Long term (current) use of aspirin: Secondary | ICD-10-CM

## 2018-08-17 DIAGNOSIS — I493 Ventricular premature depolarization: Secondary | ICD-10-CM | POA: Diagnosis present

## 2018-08-17 HISTORY — DX: Acute embolism and thrombosis of unspecified deep veins of unspecified lower extremity: I82.409

## 2018-08-17 LAB — URINALYSIS, ROUTINE W REFLEX MICROSCOPIC
Bilirubin Urine: NEGATIVE
Glucose, UA: 100 mg/dL — AB
Ketones, ur: NEGATIVE mg/dL
Leukocytes, UA: NEGATIVE
Nitrite: NEGATIVE
Protein, ur: 100 mg/dL — AB
Specific Gravity, Urine: >1.03 — ABNORMAL HIGH (ref 1.005–1.030)
pH: 5.5 (ref 5.0–8.0)

## 2018-08-17 LAB — CBC
HCT: 46.7 % (ref 39.0–52.0)
Hemoglobin: 15.5 g/dL (ref 13.0–17.0)
MCH: 28.2 pg (ref 26.0–34.0)
MCHC: 33.2 g/dL (ref 30.0–36.0)
MCV: 85.1 fL (ref 80.0–100.0)
PLATELETS: 408 10*3/uL — AB (ref 150–400)
RBC: 5.49 MIL/uL (ref 4.22–5.81)
RDW: 13.2 % (ref 11.5–15.5)
WBC: 13.8 10*3/uL — ABNORMAL HIGH (ref 4.0–10.5)
nRBC: 0 % (ref 0.0–0.2)

## 2018-08-17 LAB — URINALYSIS, MICROSCOPIC (REFLEX)

## 2018-08-17 LAB — BASIC METABOLIC PANEL
Anion gap: 19 — ABNORMAL HIGH (ref 5–15)
BUN: 8 mg/dL (ref 6–20)
CALCIUM: 8.8 mg/dL — AB (ref 8.9–10.3)
CO2: 20 mmol/L — AB (ref 22–32)
Chloride: 98 mmol/L (ref 98–111)
Creatinine, Ser: 1.2 mg/dL (ref 0.61–1.24)
GFR calc Af Amer: >60 mL/min (ref >60)
Glucose, Bld: 280 mg/dL — ABNORMAL HIGH (ref 70–99)
Potassium: 3.6 mmol/L (ref 3.5–5.1)
Sodium: 137 mmol/L (ref 135–145)

## 2018-08-17 LAB — I-STAT TROPONIN, ED: Troponin i, poc: 0.05 ng/mL (ref 0.00–0.08)

## 2018-08-17 MED ORDER — DIPHENHYDRAMINE HCL 50 MG/ML IJ SOLN
50.0000 mg | Freq: Once | INTRAMUSCULAR | Status: AC
  Administered 2018-08-18: 50 mg via INTRAVENOUS
  Filled 2018-08-17: qty 1

## 2018-08-17 MED ORDER — SODIUM CHLORIDE 0.9 % IV BOLUS
1000.0000 mL | Freq: Once | INTRAVENOUS | Status: AC
  Administered 2018-08-17: 1000 mL via INTRAVENOUS

## 2018-08-17 MED ORDER — HYDROCORTISONE NA SUCCINATE PF 100 MG IJ SOLR
200.0000 mg | Freq: Once | INTRAMUSCULAR | Status: AC
  Administered 2018-08-17: 200 mg via INTRAVENOUS
  Filled 2018-08-17: qty 4

## 2018-08-17 MED ORDER — DIPHENHYDRAMINE HCL 25 MG PO CAPS: 50.0000 mg | ORAL_CAPSULE | Freq: Once | ORAL | Status: AC

## 2018-08-17 MED ORDER — SODIUM CHLORIDE 0.9 % IV BOLUS
500.0000 mL | Freq: Once | INTRAVENOUS | Status: AC
  Administered 2018-08-17: 500 mL via INTRAVENOUS

## 2018-08-17 MED ORDER — HYDROCORTISONE NA SUCCINATE PF 250 MG IJ SOLR: 200.0000 mg | Freq: Once | INTRAMUSCULAR | Status: DC

## 2018-08-17 MED ORDER — FENTANYL CITRATE (PF) 100 MCG/2ML IJ SOLN
50.0000 ug | Freq: Once | INTRAMUSCULAR | Status: AC
  Administered 2018-08-17: 50 ug via INTRAVENOUS
  Filled 2018-08-17: qty 2

## 2018-08-17 NOTE — ED Triage Notes (Signed)
C/o acute onset of sharp substernal chest pain and SOB x 20 min.  Also reports nausea and R lower leg numbness x 20 min.  PT diaphoretic.  History of RLE DVT.  Stopped Xarelto in November due to loss of insurance.
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
## 2018-08-18 ENCOUNTER — Encounter (HOSPITAL_COMMUNITY): Payer: Self-pay | Admitting: Radiology

## 2018-08-18 ENCOUNTER — Observation Stay (HOSPITAL_BASED_OUTPATIENT_CLINIC_OR_DEPARTMENT_OTHER): Payer: Self-pay

## 2018-08-18 ENCOUNTER — Observation Stay (HOSPITAL_COMMUNITY): Payer: Self-pay | Admitting: Certified Registered Nurse Anesthetist

## 2018-08-18 ENCOUNTER — Observation Stay (HOSPITAL_COMMUNITY): Payer: Self-pay

## 2018-08-18 ENCOUNTER — Emergency Department (HOSPITAL_COMMUNITY): Payer: Self-pay

## 2018-08-18 ENCOUNTER — Encounter (HOSPITAL_COMMUNITY)
Admission: EM | Disposition: A | Discharge status: Home / Self Care | Payer: Self-pay | Source: Home / Self Care | Attending: Cardiothoracic Surgery

## 2018-08-18 DIAGNOSIS — M79609 Pain in unspecified limb: Secondary | ICD-10-CM

## 2018-08-18 DIAGNOSIS — I513 Intracardiac thrombosis, not elsewhere classified: Principal | ICD-10-CM

## 2018-08-18 DIAGNOSIS — I2692 Saddle embolus of pulmonary artery without acute cor pulmonale: Secondary | ICD-10-CM

## 2018-08-18 DIAGNOSIS — I2699 Other pulmonary embolism without acute cor pulmonale: Secondary | ICD-10-CM

## 2018-08-18 DIAGNOSIS — R0602 Shortness of breath: Secondary | ICD-10-CM

## 2018-08-18 DIAGNOSIS — J189 Pneumonia, unspecified organism: Secondary | ICD-10-CM | POA: Diagnosis present

## 2018-08-18 DIAGNOSIS — I240 Acute coronary thrombosis not resulting in myocardial infarction: Secondary | ICD-10-CM

## 2018-08-18 DIAGNOSIS — R200 Anesthesia of skin: Secondary | ICD-10-CM

## 2018-08-18 DIAGNOSIS — R202 Paresthesia of skin: Secondary | ICD-10-CM

## 2018-08-18 HISTORY — PX: THROMBECTOMY FEMORAL ARTERY: SHX6406

## 2018-08-18 LAB — HEPARIN LEVEL (UNFRACTIONATED)
Heparin Unfractionated: 0.11 IU/mL — ABNORMAL LOW (ref 0.30–0.70)
Heparin Unfractionated: 0.46 IU/mL (ref 0.30–0.70)

## 2018-08-18 LAB — TYPE AND SCREEN
ABO/RH(D): B POS
Antibody Screen: NEGATIVE

## 2018-08-18 LAB — BASIC METABOLIC PANEL
Anion gap: 14 (ref 5–15)
BUN: 7 mg/dL (ref 6–20)
CO2: 22 mmol/L (ref 22–32)
Calcium: 8.4 mg/dL — ABNORMAL LOW (ref 8.9–10.3)
Chloride: 100 mmol/L (ref 98–111)
Creatinine, Ser: 1.02 mg/dL (ref 0.61–1.24)
GFR calc Af Amer: >60 mL/min (ref >60)
GFR calc non Af Amer: >60 mL/min (ref >60)
Glucose, Bld: 266 mg/dL — ABNORMAL HIGH (ref 70–99)
Potassium: 3.8 mmol/L (ref 3.5–5.1)
Sodium: 136 mmol/L (ref 135–145)

## 2018-08-18 LAB — GLUCOSE, CAPILLARY
GLUCOSE-CAPILLARY: 193 mg/dL — AB (ref 70–99)
GLUCOSE-CAPILLARY: 236 mg/dL — AB (ref 70–99)
Glucose-Capillary: 163 mg/dL — ABNORMAL HIGH (ref 70–99)
Glucose-Capillary: 161 mg/dL — ABNORMAL HIGH (ref 70–99)
Glucose-Capillary: 257 mg/dL — ABNORMAL HIGH (ref 70–99)

## 2018-08-18 LAB — ABO/RH: ABO/RH(D): B POS

## 2018-08-18 LAB — ANTITHROMBIN III: AntiThromb III Func: 87 % (ref 75–120)

## 2018-08-18 LAB — STREP PNEUMONIAE URINARY ANTIGEN: Strep Pneumo Urinary Antigen: NEGATIVE

## 2018-08-18 LAB — TROPONIN I: Troponin I: 0.04 ng/mL (ref <0.03)

## 2018-08-18 LAB — CBG MONITORING, ED: GLUCOSE-CAPILLARY: 263 mg/dL — AB (ref 70–99)

## 2018-08-18 SURGERY — THROMBECTOMY, ARTERY, FEMORAL
Anesthesia: General | Laterality: Right

## 2018-08-18 MED ORDER — 0.9 % SODIUM CHLORIDE (POUR BTL) OPTIME
TOPICAL | Status: DC | PRN
  Administered 2018-08-18: 2000 mL

## 2018-08-18 MED ORDER — LIDOCAINE 2% (20 MG/ML) 5 ML SYRINGE
INTRAMUSCULAR | Status: DC | PRN
  Administered 2018-08-18: 60 mg via INTRAVENOUS

## 2018-08-18 MED ORDER — ONDANSETRON HCL 4 MG/2ML IJ SOLN
INTRAMUSCULAR | Status: DC | PRN
  Administered 2018-08-18: 4 mg via INTRAVENOUS

## 2018-08-18 MED ORDER — HEPARIN BOLUS VIA INFUSION
6000.0000 [IU] | Freq: Once | INTRAVENOUS | Status: AC
  Administered 2018-08-18: 6000 [IU] via INTRAVENOUS
  Filled 2018-08-18: qty 6000

## 2018-08-18 MED ORDER — SODIUM CHLORIDE 0.9 % IV SOLN
500.0000 mg | INTRAVENOUS | Status: DC
  Administered 2018-08-18: 500 mg via INTRAVENOUS
  Filled 2018-08-18 (×2): qty 500

## 2018-08-18 MED ORDER — ROCURONIUM BROMIDE 50 MG/5ML IV SOSY
PREFILLED_SYRINGE | INTRAVENOUS | Status: DC | PRN
  Administered 2018-08-18: 50 mg via INTRAVENOUS
  Administered 2018-08-18: 30 mg via INTRAVENOUS

## 2018-08-18 MED ORDER — INSULIN GLARGINE 100 UNIT/ML ~~LOC~~ SOLN
15.0000 [IU] | Freq: Every day | SUBCUTANEOUS | Status: DC
  Administered 2018-08-18 – 2018-08-20 (×3): 15 [IU] via SUBCUTANEOUS
  Filled 2018-08-18 (×3): qty 0.15

## 2018-08-18 MED ORDER — ROCURONIUM BROMIDE 50 MG/5ML IV SOSY
PREFILLED_SYRINGE | INTRAVENOUS | Status: AC
  Filled 2018-08-18: qty 10

## 2018-08-18 MED ORDER — ETOMIDATE 2 MG/ML IV SOLN
INTRAVENOUS | Status: DC | PRN
  Administered 2018-08-18: 18 mg via INTRAVENOUS

## 2018-08-18 MED ORDER — SUCCINYLCHOLINE CHLORIDE 20 MG/ML IJ SOLN
INTRAMUSCULAR | Status: DC | PRN
  Administered 2018-08-18: 180 mg via INTRAVENOUS

## 2018-08-18 MED ORDER — PROPOFOL 10 MG/ML IV BOLUS
INTRAVENOUS | Status: AC
  Filled 2018-08-18: qty 20

## 2018-08-18 MED ORDER — LIDOCAINE 2% (20 MG/ML) 5 ML SYRINGE
INTRAMUSCULAR | Status: AC
  Filled 2018-08-18: qty 5

## 2018-08-18 MED ORDER — HEPARIN SODIUM (PORCINE) 1000 UNIT/ML IJ SOLN
INTRAMUSCULAR | Status: DC | PRN
  Administered 2018-08-18: 6000 [IU] via INTRAVENOUS

## 2018-08-18 MED ORDER — ALBUMIN HUMAN 5 % IV SOLN
INTRAVENOUS | Status: DC | PRN
  Administered 2018-08-18 (×2): via INTRAVENOUS

## 2018-08-18 MED ORDER — IOPAMIDOL (ISOVUE-370) INJECTION 76%
INTRAVENOUS | Status: AC
  Administered 2018-08-18: 08:00:00
  Filled 2018-08-18: qty 100

## 2018-08-18 MED ORDER — ONDANSETRON HCL 4 MG/2ML IJ SOLN: 4.0000 mg | Freq: Four times a day (QID) | INTRAMUSCULAR | Status: DC | PRN

## 2018-08-18 MED ORDER — FENTANYL CITRATE (PF) 250 MCG/5ML IJ SOLN
INTRAMUSCULAR | Status: AC
  Filled 2018-08-18: qty 5

## 2018-08-18 MED ORDER — IOPAMIDOL (ISOVUE-370) INJECTION 76%
100.0000 mL | Freq: Once | INTRAVENOUS | Status: AC | PRN
  Administered 2018-08-18: 100 mL via INTRAVENOUS

## 2018-08-18 MED ORDER — INSULIN ASPART 100 UNIT/ML ~~LOC~~ SOLN
SUBCUTANEOUS | Status: AC
  Filled 2018-08-18: qty 1

## 2018-08-18 MED ORDER — MIDAZOLAM HCL 2 MG/2ML IJ SOLN
INTRAMUSCULAR | Status: AC
  Filled 2018-08-18: qty 2

## 2018-08-18 MED ORDER — FENTANYL CITRATE (PF) 100 MCG/2ML IJ SOLN
50.0000 ug | Freq: Once | INTRAMUSCULAR | Status: AC
  Administered 2018-08-18: 50 ug via INTRAVENOUS
  Filled 2018-08-18: qty 2

## 2018-08-18 MED ORDER — PHENYLEPHRINE HCL 10 MG/ML IJ SOLN
INTRAMUSCULAR | Status: DC | PRN
  Administered 2018-08-18 (×2): 120 ug via INTRAVENOUS
  Administered 2018-08-18: 160 ug via INTRAVENOUS
  Administered 2018-08-18: 120 ug via INTRAVENOUS

## 2018-08-18 MED ORDER — INSULIN ASPART 100 UNIT/ML ~~LOC~~ SOLN
0.0000 [IU] | SUBCUTANEOUS | Status: DC
  Administered 2018-08-18: 5 [IU] via SUBCUTANEOUS
  Administered 2018-08-18: 2 [IU] via SUBCUTANEOUS
  Administered 2018-08-18 (×2): 5 [IU] via SUBCUTANEOUS
  Administered 2018-08-19 (×2): 3 [IU] via SUBCUTANEOUS
  Administered 2018-08-19: 5 [IU] via SUBCUTANEOUS
  Administered 2018-08-19: 2 [IU] via SUBCUTANEOUS
  Administered 2018-08-19: 7 [IU] via SUBCUTANEOUS
  Administered 2018-08-19 – 2018-08-20 (×3): 3 [IU] via SUBCUTANEOUS
  Administered 2018-08-20: 2 [IU] via SUBCUTANEOUS
  Administered 2018-08-20: 3 [IU] via SUBCUTANEOUS
  Administered 2018-08-21: 1 [IU] via SUBCUTANEOUS
  Administered 2018-08-21: 2 [IU] via SUBCUTANEOUS
  Administered 2018-08-21: 7 [IU] via SUBCUTANEOUS

## 2018-08-18 MED ORDER — HYDROMORPHONE HCL 1 MG/ML IJ SOLN
INTRAMUSCULAR | Status: AC
  Administered 2018-08-18: 0.25 mg via INTRAVENOUS
  Filled 2018-08-18: qty 1

## 2018-08-18 MED ORDER — HEPARIN (PORCINE) 25000 UT/250ML-% IV SOLN
2400.0000 [IU]/h | INTRAVENOUS | Status: DC
  Administered 2018-08-18: 1800 [IU]/h via INTRAVENOUS
  Administered 2018-08-18: 2050 [IU]/h via INTRAVENOUS
  Administered 2018-08-19: 2250 [IU]/h via INTRAVENOUS
  Administered 2018-08-19: 2050 [IU]/h via INTRAVENOUS
  Administered 2018-08-20 – 2018-08-21 (×2): 2250 [IU]/h via INTRAVENOUS
  Filled 2018-08-18 (×7): qty 250

## 2018-08-18 MED ORDER — FENTANYL CITRATE (PF) 100 MCG/2ML IJ SOLN
25.0000 ug | INTRAMUSCULAR | Status: DC | PRN
  Administered 2018-08-18 – 2018-08-20 (×4): 50 ug via INTRAVENOUS
  Filled 2018-08-18 (×4): qty 2

## 2018-08-18 MED ORDER — SENNOSIDES-DOCUSATE SODIUM 8.6-50 MG PO TABS: 1.0000 | ORAL_TABLET | Freq: Every evening | ORAL | Status: DC | PRN

## 2018-08-18 MED ORDER — FENTANYL CITRATE (PF) 100 MCG/2ML IJ SOLN
INTRAMUSCULAR | Status: DC | PRN
  Administered 2018-08-18: 100 ug via INTRAVENOUS
  Administered 2018-08-18: 50 ug via INTRAVENOUS

## 2018-08-18 MED ORDER — ONDANSETRON HCL 4 MG/2ML IJ SOLN: 4.0000 mg | Freq: Once | INTRAMUSCULAR | Status: DC | PRN

## 2018-08-18 MED ORDER — SUGAMMADEX SODIUM 200 MG/2ML IV SOLN
INTRAVENOUS | Status: DC | PRN
  Administered 2018-08-18: 300 mg via INTRAVENOUS

## 2018-08-18 MED ORDER — ACETAMINOPHEN 325 MG PO TABS
650.0000 mg | ORAL_TABLET | Freq: Four times a day (QID) | ORAL | Status: DC | PRN
  Administered 2018-08-18 – 2018-08-20 (×2): 650 mg via ORAL
  Filled 2018-08-18 (×2): qty 2

## 2018-08-18 MED ORDER — LACTATED RINGERS IV SOLN
INTRAVENOUS | Status: DC | PRN
  Administered 2018-08-18 (×2): via INTRAVENOUS

## 2018-08-18 MED ORDER — ONDANSETRON HCL 4 MG/2ML IJ SOLN
INTRAMUSCULAR | Status: AC
  Filled 2018-08-18: qty 2

## 2018-08-18 MED ORDER — ACETAMINOPHEN 650 MG RE SUPP: 650.0000 mg | Freq: Four times a day (QID) | RECTAL | Status: DC | PRN

## 2018-08-18 MED ORDER — PHENYLEPHRINE 40 MCG/ML (10ML) SYRINGE FOR IV PUSH (FOR BLOOD PRESSURE SUPPORT)
PREFILLED_SYRINGE | INTRAVENOUS | Status: AC
  Filled 2018-08-18: qty 10

## 2018-08-18 MED ORDER — DEXAMETHASONE SODIUM PHOSPHATE 10 MG/ML IJ SOLN
INTRAMUSCULAR | Status: DC | PRN
  Administered 2018-08-18: 4 mg via INTRAVENOUS

## 2018-08-18 MED ORDER — MIDAZOLAM HCL 5 MG/5ML IJ SOLN
INTRAMUSCULAR | Status: DC | PRN
  Administered 2018-08-18: 2 mg via INTRAVENOUS

## 2018-08-18 MED ORDER — HEPARIN BOLUS VIA INFUSION
3000.0000 [IU] | Freq: Once | INTRAVENOUS | Status: AC
  Administered 2018-08-18: 3000 [IU] via INTRAVENOUS
  Filled 2018-08-18: qty 3000

## 2018-08-18 MED ORDER — SODIUM CHLORIDE 0.9 % IV SOLN
INTRAVENOUS | Status: DC | PRN
  Administered 2018-08-18: 500 mL

## 2018-08-18 MED ORDER — HYDROMORPHONE HCL 1 MG/ML IJ SOLN
0.2500 mg | INTRAMUSCULAR | Status: DC | PRN
  Administered 2018-08-18 (×2): 0.25 mg via INTRAVENOUS

## 2018-08-18 MED ORDER — SODIUM CHLORIDE 0.9% FLUSH
3.0000 mL | Freq: Two times a day (BID) | INTRAVENOUS | Status: DC
  Administered 2018-08-18 – 2018-08-23 (×6): 3 mL via INTRAVENOUS

## 2018-08-18 MED ORDER — SODIUM CHLORIDE 0.9 % IV SOLN
INTRAVENOUS | Status: AC
  Filled 2018-08-18: qty 1.2

## 2018-08-18 MED ORDER — SODIUM CHLORIDE 0.9 % IV SOLN
INTRAVENOUS | Status: AC
  Administered 2018-08-18: 07:00:00 via INTRAVENOUS

## 2018-08-18 MED ORDER — SODIUM CHLORIDE 0.9 % IV SOLN
INTRAVENOUS | Status: DC | PRN
  Administered 2018-08-18: 25 ug/min via INTRAVENOUS

## 2018-08-18 MED ORDER — SODIUM CHLORIDE 0.9 % IV SOLN
1.0000 g | INTRAVENOUS | Status: DC
  Administered 2018-08-18: 1 g via INTRAVENOUS
  Filled 2018-08-18: qty 10

## 2018-08-18 MED ORDER — SODIUM CHLORIDE 0.9 % IV SOLN
1.0000 g | Freq: Once | INTRAVENOUS | Status: AC
  Administered 2018-08-18: 1 g via INTRAVENOUS
  Filled 2018-08-18: qty 10

## 2018-08-18 MED ORDER — ONDANSETRON HCL 4 MG PO TABS: 4.0000 mg | ORAL_TABLET | Freq: Four times a day (QID) | ORAL | Status: DC | PRN

## 2018-08-18 MED ORDER — DEXAMETHASONE SODIUM PHOSPHATE 10 MG/ML IJ SOLN
INTRAMUSCULAR | Status: AC
  Filled 2018-08-18: qty 1

## 2018-08-18 MED ORDER — CEFAZOLIN SODIUM 1 G IJ SOLR
INTRAMUSCULAR | Status: AC
  Filled 2018-08-18: qty 20

## 2018-08-18 MED ORDER — CEFAZOLIN SODIUM-DEXTROSE 2-3 GM-%(50ML) IV SOLR
INTRAVENOUS | Status: DC | PRN
  Administered 2018-08-18: 2 g via INTRAVENOUS

## 2018-08-18 MED ORDER — SODIUM CHLORIDE 0.9 % IV SOLN
500.0000 mg | Freq: Once | INTRAVENOUS | Status: AC
  Administered 2018-08-18: 500 mg via INTRAVENOUS
  Filled 2018-08-18: qty 500

## 2018-08-18 SURGICAL SUPPLY — 72 items
ADH SKN CLS APL DERMABOND .7 (GAUZE/BANDAGES/DRESSINGS) ×1
AGENT HMST SPONGE THK3/8 (HEMOSTASIS)
BANDAGE ESMARK 6X9 LF (GAUZE/BANDAGES/DRESSINGS) IMPLANT
BNDG CMPR 9X6 STRL LF SNTH (GAUZE/BANDAGES/DRESSINGS)
BNDG ESMARK 6X9 LF (GAUZE/BANDAGES/DRESSINGS)
CANISTER SUCT 3000ML PPV (MISCELLANEOUS) ×3 IMPLANT
CATH EMB 2FR 60CM (CATHETERS) ×2 IMPLANT
CATH EMB 3FR 80CM (CATHETERS) ×2 IMPLANT
CATH EMB 4FR 80CM (CATHETERS) ×2 IMPLANT
CATH EMB 5FR 80CM (CATHETERS) ×2 IMPLANT
CLIP VESOCCLUDE MED 24/CT (CLIP) ×3 IMPLANT
CLIP VESOCCLUDE SM WIDE 24/CT (CLIP) ×3 IMPLANT
CONT SPEC 4OZ CLIKSEAL STRL BL (MISCELLANEOUS) ×2 IMPLANT
COVER PROBE W GEL 5X96 (DRAPES) ×1 IMPLANT
COVER WAND RF STERILE (DRAPES) ×1 IMPLANT
CUFF TOURNIQUET SINGLE 24IN (TOURNIQUET CUFF) IMPLANT
CUFF TOURNIQUET SINGLE 34IN LL (TOURNIQUET CUFF) IMPLANT
CUFF TOURNIQUET SINGLE 44IN (TOURNIQUET CUFF) IMPLANT
DERMABOND ADVANCED (GAUZE/BANDAGES/DRESSINGS) ×2
DERMABOND ADVANCED .7 DNX12 (GAUZE/BANDAGES/DRESSINGS) IMPLANT
DRAIN CHANNEL 15F RND FF W/TCR (WOUND CARE) IMPLANT
DRAIN CHANNEL 19F RND (DRAIN) ×2 IMPLANT
DRAPE C-ARM 42X72 X-RAY (DRAPES) ×1 IMPLANT
ELECT REM PT RETURN 9FT ADLT (ELECTROSURGICAL) ×3
ELECTRODE REM PT RTRN 9FT ADLT (ELECTROSURGICAL) ×1 IMPLANT
EVACUATOR SILICONE 100CC (DRAIN) ×2 IMPLANT
GAUZE SPONGE 4X4 12PLY STRL LF (GAUZE/BANDAGES/DRESSINGS) ×2 IMPLANT
GLOVE BIO SURGEON STRL SZ 6.5 (GLOVE) ×1 IMPLANT
GLOVE BIO SURGEON STRL SZ7.5 (GLOVE) ×3 IMPLANT
GLOVE BIO SURGEONS STRL SZ 6.5 (GLOVE) ×1
GLOVE BIOGEL PI IND STRL 7.0 (GLOVE) IMPLANT
GLOVE BIOGEL PI IND STRL 7.5 (GLOVE) IMPLANT
GLOVE BIOGEL PI IND STRL 8 (GLOVE) ×1 IMPLANT
GLOVE BIOGEL PI INDICATOR 7.0 (GLOVE) ×2
GLOVE BIOGEL PI INDICATOR 7.5 (GLOVE) ×8
GLOVE BIOGEL PI INDICATOR 8 (GLOVE) ×2
GLOVE ECLIPSE 7.0 STRL STRAW (GLOVE) ×2 IMPLANT
GOWN STRL REUS W/ TWL LRG LVL3 (GOWN DISPOSABLE) ×2 IMPLANT
GOWN STRL REUS W/ TWL XL LVL3 (GOWN DISPOSABLE) ×2 IMPLANT
GOWN STRL REUS W/TWL LRG LVL3 (GOWN DISPOSABLE) ×6
GOWN STRL REUS W/TWL XL LVL3 (GOWN DISPOSABLE) ×3
HEMOSTAT SPONGE AVITENE ULTRA (HEMOSTASIS) IMPLANT
INSERT FOGARTY SM (MISCELLANEOUS) IMPLANT
KIT BASIN OR (CUSTOM PROCEDURE TRAY) ×3 IMPLANT
KIT TURNOVER KIT B (KITS) ×3 IMPLANT
NS IRRIG 1000ML POUR BTL (IV SOLUTION) ×6 IMPLANT
PACK PERIPHERAL VASCULAR (CUSTOM PROCEDURE TRAY) ×3 IMPLANT
PAD ARMBOARD 7.5X6 YLW CONV (MISCELLANEOUS) ×6 IMPLANT
STAPLER VISISTAT 35W (STAPLE) IMPLANT
STOPCOCK 4 WAY LG BORE MALE ST (IV SETS) IMPLANT
SUT ETHILON 3 0 PS 1 (SUTURE) ×2 IMPLANT
SUT GORETEX 5 0 TT13 24 (SUTURE) IMPLANT
SUT GORETEX 6.0 TT13 (SUTURE) IMPLANT
SUT MNCRL AB 4-0 PS2 18 (SUTURE) ×5 IMPLANT
SUT PROLENE 5 0 C 1 24 (SUTURE) ×7 IMPLANT
SUT PROLENE 6 0 BV (SUTURE) ×15 IMPLANT
SUT PROLENE 7 0 BV 1 (SUTURE) IMPLANT
SUT SILK 2 0 PERMA HAND 18 BK (SUTURE) IMPLANT
SUT SILK 3 0 (SUTURE)
SUT SILK 3-0 18XBRD TIE 12 (SUTURE) IMPLANT
SUT VIC AB 2-0 CT1 27 (SUTURE) ×9
SUT VIC AB 2-0 CT1 TAPERPNT 27 (SUTURE) ×2 IMPLANT
SUT VIC AB 3-0 SH 27 (SUTURE) ×3
SUT VIC AB 3-0 SH 27X BRD (SUTURE) ×3 IMPLANT
SYR 3ML LL SCALE MARK (SYRINGE) ×6 IMPLANT
SYR TB 1ML LUER SLIP (SYRINGE) ×2 IMPLANT
TAPE CLOTH SURG 4X10 WHT LF (GAUZE/BANDAGES/DRESSINGS) ×2 IMPLANT
TOWEL GREEN STERILE (TOWEL DISPOSABLE) ×3 IMPLANT
TRAY FOLEY MTR SLVR 16FR STAT (SET/KITS/TRAYS/PACK) ×3 IMPLANT
TUBING EXTENTION W/L.L. (IV SETS) IMPLANT
UNDERPAD 30X30 (UNDERPADS AND DIAPERS) ×3 IMPLANT
WATER STERILE IRR 1000ML POUR (IV SOLUTION) ×3 IMPLANT

## 2018-08-18 NOTE — ED Provider Notes (Signed)
Medical screening examination/treatment/procedure(s) were conducted as a shared visit with non-physician practitioner(s) and myself.  I personally evaluated the patient during the encounter.  EKG Interpretation  Date/Time:  Friday August 17 2018 23:49:57 EST Ventricular Rate:  76 PR Interval:    QRS Duration: 161 QT Interval:  390 QTC Calculation: 421 R Axis:   -19 Text Interpretation:  Normal sinus rhythm Nonspecific intraventricular conduction delay Inferior infarct, old No significant change since last tracing Confirmed by Ward, Baxter HireKristen [phone]) on 08/17/2018 11:53:04 PM   Patient is a 47 year old male with history of saddle pulmonary embolus no longer on anticoagulation who presented to the emergency department with chest pain, shortness of breath, right lower extremity cramping and numbness from his knee down.  CT imaging was pending from previous providers.  CT scan shows a small nonocclusive right pulmonary embolus without right heart strain.  He has a large left ventricular thrombus which is high risk for embolism.  Multiple infiltrates/groundglass opacities concerning for infectious etiology.  On examination, patient is neurologically intact.  He has palpable radial and DP pulses bilaterally.  No complaints of chest pain or shortness of breath.  Hemodynamically stable.  Has never had a hypercoagulable work-up before.  Will start heparin and discussed with critical care for admission.   CRITICAL CARE Performed by: Rochele RaringKristen Ward   Total critical care time: 35 minutes  Critical care time was exclusive of separately billable procedures and treating other patients.  Critical care was necessary to treat or prevent imminent or life-threatening deterioration.  Critical care was time spent personally by me on the following activities: development of treatment plan with patient and/or surrogate as well as nursing, discussions with consultants, evaluation of patient's response to treatment,  examination of patient, obtaining history from patient or surrogate, ordering and performing treatments and interventions, ordering and review of laboratory studies, ordering and review of radiographic studies, pulse oximetry and re-evaluation of patient's condition.    Ward, Layla MawKristen N, DO 08/18/18 [phone]

## 2018-08-18 NOTE — Progress Notes (Addendum)
CRITICAL VALUE ALERT  Critical Value:  Troponin 0.04  Date & Time Notied:  08/18/2018 by lab at 1737  Provider Notified: Dr. Antionette Charpyd notified   Orders Received/Actions taken: No further orders given as of now

## 2018-08-18 NOTE — Op Note (Signed)
Date: August 18, 2018  Preoperative diagnosis: Acute limb ischemia of the right lower extremity due to suspected cardiac embolus  Postoperative diagnosis: Same  Procedure: 1.  Right iliac, femoral, popliteal, and tibial thromboembolectomy via right femoral cutdown 2. 19 Fr Blake drain placed in the right groin   Surgeon: Dr. Cephus Shellinghristopher J. Jazlyne Gauger, MD  Assistant: Mosetta PigeonEmma Maureen Collins, PA  Indications: Patient is a 47 year old male who presented to the ED yesterday with right leg pain and shortness of breath.  Ultimately he was admitted to the medicine service undergoing work-up and a venous duplex was ordered today to rule out recurrent DVT given a history of recurrent DVTs and PEs.  Ultimately it was noted that the patient had no flow in the right SFA, popliteal, and tibial vessels and this is in the setting of a newly diagnosed left ventricular thrombus.  Vascular surgery was subsequent consulted emergently.  We recommend taking the patient emergently to the OR after risks and benefits were discussed.  He is high risk for additional thromboembolic events after this operative intervention.  Findings: No thrombus was retrieved from the iliac vessels with good inflow.  The profunda was also patent with robust backbleeding and no thrombus was retrieved after passing a fogarty.  When passing our Fogarty down the SFA distally into the pop and tibials we retrieved a long segment of fresh thrombus that was about 10 cm in length and was passed off the field and sent to pathology.  At the completion of the case the patient had a robust right dorsalis pedis and posterior tibial signals.  Complications: None  Anesthesia: General  Details: The patient was taken to the operating room after informed consent was obtained.  He was placed on the operative table in supine position.  His right leg and bilateral groins were prepped and draped in usual sterile fashion.  Given his size we used ultrasound to  identify his right common femoral artery just below his inguinal crease where the profunda branched and this was marked.  We then made a transverse incision parallel to the inguinal ligament just above the inguinal crease.  We dissected down through this using Bovie cautery and blunt dissection until we identified the common femoral artery where the sheath was opened longitudinally.  We did encounter one large vein branch off the femoral vein that had to be ligated with several 5-0 Prolene sutures.  Ultimately the common femoral artery was dissected out and controlled proximally with a vessel loop and then we dissected down and controlled SFA and the profunda with vessel loops as well.  All of these arteries were very soft.  There was a robust pulse in the common femoral artery with apparently good inflow.  Ultimately the patient was given additional 6000 units of IV heparin and his heparin drip was running continuously throughout the case and never stopped.  The common femoral artery was then opened in transverse fashion with 11 blade scalpel over the profunda bifurcation.  We then passed a #4 Fogarty proximally up the external iliac and retrieved no thrombus with pulsatile brisk bleeding.  We then passed a #3 Fogarty distally down the SFA with no resistance until the catheter was hubbed (this was an 80 cm catheter and the longest we had).  After several passes retrieved a very long fresh appearing thrombus that was passed off the field to pathology.  We made two additional passes and retrieved several additional small clots.  Finally after two clean passes a #2 Fogarty  was passed down the profundus since we could get an #3 Fogarty to pass and retrieved no additional thrombus here.  We then flushed all arteries with heparinized saline.  Arteries were forewardbleed and backbleed.  The arteriotomy was then closed in interrupted fashion using 6-0 Prolene.  At that point in time we went down to the foot and the patient  had robust posterior tibial signal and an AT signal.  I debated fasciotomies but the calf is very soft and the patient on my exam this evening had a very warm foot prior to going to the OR and did not not appear overtly ischemic.  At that point in time the groin was then irrigated with saline until the effluent was clear.  We closed the groin in multiple layers of 2-0 Vicryl 3-0 Vicryl 4-0 Monocryl.  A 19 French round drain was placed in the wound bed given his large habitus and high risk for wound breakdown.  At that point in time wentd back down to check the foot again and the patient had robust posterior tibial and dorsalis pedis signal.  The calf still remained soft after watching his leg for about 15 to 20 minutes.  Condition: Stable  Cephus Shellinghristopher J. Sadeel Fiddler, MD [facility] Office: [phone] Pager: [phone]  Cephus Shellinghristopher J Stacye Noori

## 2018-08-18 NOTE — Transfer of Care (Signed)
Immediate Anesthesia Transfer of Care Note  Patient: Marcus SpillerWilliam L Beem Jr.  Procedure(s) Performed: RIGHT LOWER EXTREMITY THROMBECTOMY (Right )  Patient Location: PACU  Anesthesia Type:General  Level of Consciousness: awake, alert  and oriented  Airway & Oxygen Therapy: Patient Spontanous Breathing and Patient connected to nasal cannula oxygen  Post-op Assessment: Report given to RN and Post -op Vital signs reviewed and stable  Post vital signs: Reviewed and stable  Last Vitals:  Vitals Value Taken Time  BP 113/69 08/18/2018  5:29 PM  Temp    Pulse 85 08/18/2018  5:33 PM  Resp 14 08/18/2018  5:33 PM  SpO2 94 % 08/18/2018  5:33 PM  Vitals shown include unvalidated device data.  Last Pain:  Vitals:   08/18/18 1100  TempSrc: Oral  PainSc:          Complications: No apparent anesthesia complications

## 2018-08-18 NOTE — Progress Notes (Signed)
Discussed with Dr Tyrone SageGerhardt; in OR emergently for possible thrombectomy of RLE embolus. Echo shows extremely large and mobile density concerning for thrombus; there is apical hypokinesis as well. Initial troponin negative so doubt acute MI but will recycle; ECG with no ST elevation. Would begin heparin following surgery as soon as possible. Will arrange cardiac MRI to further assess LV mass though appears to be thrombus. Given h/o multiple PEs and now LV thrombus, will likely need W/U for hypercoaguable state in the future. Apical thrombus appears high risk to embolize. Dr Tyrone SageGerhardt did not think emergent surgery indicated. Olga MillersBrian Taytum Wheller, MD
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
## 2018-08-18 NOTE — Progress Notes (Signed)
ANTICOAGULATION CONSULT NOTE  Pharmacy Consult for Heparin  Indication: pulmonary embolus and DVT  Allergies  Allergen Reactions  . Contrast Media [Iodinated Diagnostic Agents] Itching    Pt states he thinks it makes him itch  . Lovenox [Enoxaparin Sodium] Itching    Itching over entire body  . Percocet [Oxycodone-Acetaminophen] Hives and Itching    Patient Measurements: ~135 kg  Vital Signs: Temp: 98.4 F (36.9 C) (12/21 0634) Temp Source: Oral (12/21 0634) BP: 114/74 (12/21 0634) Pulse Rate: 77 (12/21 0634)  Labs: Recent Labs    08/17/18 1925 08/18/18 0643 08/18/18 0930  HGB 15.5  --   --   HCT 46.7  --   --   PLT 408*  --   --   HEPARINUNFRC  --   --  0.11*  CREATININE 1.20 1.02  --     CrCl cannot be calculated (Unknown ideal weight.).    Assessment: 47 y/o M with recent history of DVT and PE, he had been on Xarelto until last month, unable to obtain due to financial reasons, now presents to the ED with chest pain, CT angio with PE and LV thrombus.   Initial heparin level low at 0.11   Goal of Therapy:  Heparin level 0.3-0.7 units/ml Monitor platelets by anticoagulation protocol: Yes   Plan:  Heparin 3000 units BOLUS Increase heparin drip to 2050 units / hr 1800 Heparin level Daily CBC/HL Monitor for bleeding  Thank you Okey RegalLisa Hend Mccarrell, PharmD [phone] 08/18/2018,10:38 AM

## 2018-08-18 NOTE — ED Notes (Signed)
Pt given urinals after he called out needing to go to the bathroom.

## 2018-08-18 NOTE — Anesthesia Procedure Notes (Signed)
Procedure Name: Intubation Date/Time: 08/18/2018 3:32 PM Performed by: Inda Coke, CRNA Pre-anesthesia Checklist: Patient identified, Emergency Drugs available, Suction available and Patient being monitored Patient Re-evaluated:Patient Re-evaluated prior to induction Oxygen Delivery Method: Circle System Utilized Preoxygenation: Pre-oxygenation with 100% oxygen Induction Type: IV induction Ventilation: Mask ventilation without difficulty and Oral airway inserted - appropriate to patient size Laryngoscope Size: Mac and 4 Grade View: Grade I Tube type: Oral Tube size: 7.5 mm Number of attempts: 1 Airway Equipment and Method: Stylet and Oral airway Placement Confirmation: ETT inserted through vocal cords under direct vision,  positive ETCO2 and breath sounds checked- equal and bilateral Secured at: 22 cm Tube secured with: Tape Dental Injury: Teeth and Oropharynx as per pre-operative assessment

## 2018-08-18 NOTE — Progress Notes (Signed)
Patient is a 47 year old male with past medical history of insulin-dependent diabetes mellitus, right leg DVT, recurrent PE we stopped anticoagulation prematurely due to financial condition who presented to the emergency department yesterday night with complaints of acute onset of substernal chest pain, shortness of breath and right lower extremity numbness.  Patient was found to have small non-occlusive right upper lobe PE without right heart strain but also large left liver lesion ventricular thrombus.  Patient started on heparin drip.  Cardiology consulted.  Given the size of left ventricular thrombus we also consulted cardiothoracic surgery who recommended to do TEE. During the day, I was notified by ultrasound tech that patient does not have  arterial flow on his left lower extremity very concerning for right lower extremity thromboembolism.  There was no flow in SFA/popliteal/tibials on duplex.  I immediately consulted vascular surgery and he is undergoing emergent vascular intervention/thrombectomy. We will continue to monitor him. Cardiology planning for cardiac MRI Patient seen by Dr. Antionette Charpyd this morning.

## 2018-08-18 NOTE — Progress Notes (Signed)
ANTICOAGULATION CONSULT NOTE - Initial Consult  Pharmacy Consult for Heparin  Indication: pulmonary embolus and DVT  Allergies  Allergen Reactions  . Contrast Media [Iodinated Diagnostic Agents] Itching    Pt states he thinks it makes him itch  . Lovenox [Enoxaparin Sodium] Itching    Itching over entire body  . Percocet [Oxycodone-Acetaminophen] Hives and Itching    Patient Measurements: ~135 kg  Vital Signs: Temp: 98.6 F (37 C) (12/20 2338) Temp Source: Oral (12/20 2338) BP: 116/98 (12/21 0000) Pulse Rate: 84 (12/21 0000)  Labs: Recent Labs    08/17/18 1925  HGB 15.5  HCT 46.7  PLT 408*  CREATININE 1.20    CrCl cannot be calculated (Unknown ideal weight.).   Medical History: Past Medical History:  Diagnosis Date  . Anemia age 89 or 7   none since  . Arthritis    oa  . Diabetes mellitus without complication (HCC)    newly diagnosed  . DVT (deep venous thrombosis) (HCC)   . GERD (gastroesophageal reflux disease)   . Head problem    back of top of head area swells at times, had glass in wound at times, pt instrcuted to follow up with md, if any pus seen  . Headache(784.0)    migraine  . Morbid obesity (HCC)   . PE (pulmonary embolism) 10/01/2014  . Prediabetes on 04-28-14    Assessment: 47 y/o M with recent history of DVT and PE, he had been on Xarelto until last month, unable to obtain due to financial reasons, now presents to the ED with chest pain, CT angio with PE and LV thrombus. Starting heparin. CBC good.   Goal of Therapy:  Heparin level 0.3-0.7 units/ml Monitor platelets by anticoagulation protocol: Yes   Plan:  Heparin 6000 units BOLUS Start heparin drip at 1800 units/hr 1000 HL Daily CBC/HL Monitor for bleeding  Abran DukeLedford, Mariadel Mruk 08/18/2018,2:00 AM

## 2018-08-18 NOTE — Progress Notes (Signed)
RN verified the presence of a signed informed consent that matches stated procedure by patient. Verified armband matches patient's stated name and birth date. Verified NPO status and that all jewelry, contact, glasses, dentures, and partials had been removed (if applicable).  

## 2018-08-18 NOTE — Progress Notes (Addendum)
[address]       [place] [address]             [phone]      Subjective: Update since this morning patient has had, echocardiogram done lower extremity Dopplers done to rule out DVT, and arterial Dopplers of the lower extremity.  Patient had no evidence of DVT in the lower extremities, but Doppler did indicate lack of flow to the right lower leg arterial.  Patient seen by vascular surgery and to have embolectomy on the right leg.  Objective: Vital signs in last 24 hours: Patient Vitals for the past 24 hrs:  BP Temp Temp src Pulse Resp SpO2  08/18/18 1100 126/76 98.4 F (36.9 C) Oral - (!) 21 97 %  08/18/18 0900 - - - - 18 -  08/18/18 0700 - - - - 17 -  08/18/18 0634 114/74 98.4 F (36.9 C) Oral 77 19 96 %  08/18/18 0545 123/67 - - 70 17 96 %  08/18/18 0500 (!) 122/98 - - 84 19 97 %  08/18/18 0430 - - - 82 18 100 %  08/18/18 0415 (!) 112/96 - - 85 15 (!) 73 %  08/18/18 0400 (!) 159/97 - - 95 20 98 %  08/18/18 0330 (!) 162/97 - - 94 (!) 21 99 %  08/18/18 0315 (!) 146/90 - - 85 16 100 %  08/18/18 0000 (!) 116/98 - - 84 (!) 27 98 %  08/17/18 2338 128/77 98.6 F (37 C) Oral 85 16 98 %  08/17/18 2300 (!) 143/89 - - 78 (!) 23 97 %  08/17/18 2245 138/83 - - 76 - 95 %  08/17/18 2130 (!) 177/109 - - 93 - 99 %  08/17/18 2015 (!) 175/113 - - 86 - 99 %  08/17/18 2000 (!) 154/110 - - 89 - 99 %    There were no vitals filed for this visit.  Hemodynamic parameters for last 24 hours:    Intake/Output from previous day: No intake/output data recorded. Intake/Output this shift: Total I/O In: 0  Out: 650 [Urine:650]  Scheduled Meds: . [MAR Hold] insulin aspart  0-9 Units Subcutaneous Q4H  . [MAR Hold] insulin glargine  15 Units Subcutaneous QHS  . [MAR Hold] sodium chloride flush  3 mL Intravenous Q12H   Continuous Infusions: . sodium chloride 75 mL/hr at 08/18/18 0654  . [MAR Hold] azithromycin    . [MAR Hold] cefTRIAXone (ROCEPHIN)  IV    . heparin  2,050 Units/hr (08/18/18 1130)   PRN Meds:.[MAR Hold] acetaminophen **OR** [MAR Hold] acetaminophen, [MAR Hold] fentaNYL (SUBLIMAZE) injection, [MAR Hold] ondansetron **OR** [MAR Hold] ondansetron (ZOFRAN) IV, [MAR Hold] senna-docusate  General appearance: alert, cooperative and no distress Neurologic: intact Heart: regular rate and rhythm, S1, S2 normal, no murmur, click, rub or gallop Lungs: clear to auscultation bilaterally Abdomen: soft, non-tender; bowel sounds normal; no masses,  no organomegaly Patient's feet are warm, with movement, patient does note that the right foot's numb compared to the left, no palpable DP or PT pulses on the right   Lab Results: CBC: Recent Labs    08/17/18 1925  WBC 13.8*  HGB 15.5  HCT 46.7  PLT 408*   BMET:  Recent Labs    08/17/18 1925 08/18/18 0643  NA 137 136  K 3.6 3.8  CL 98 100  CO2 20* 22  GLUCOSE 280* 266*  BUN 8 7  CREATININE 1.20 1.02  CALCIUM 8.8* 8.4*  PT/INR: No results for input(s): LABPROT, INR in the last 72 hours.   Radiology Dg Chest 2 View  Result Date: 08/17/2018 CLINICAL DATA:  Shortness of breath. Chest pain. Left lower extremity pain. EXAM: CHEST - 2 VIEW COMPARISON:  Two-view chest x-ray 05/29/2018 FINDINGS: Heart size is normal. Patchy airspace opacities are present in the perihilar regions bilaterally. Mild bibasilar atelectasis is noted. The visualized soft tissues and bony thorax are unremarkable. IMPRESSION: 1. Patchy airspace disease in the right greater than left upper lobe concerning for early infection. Electronically Signed   By: Marin Roberts M.D.   On: 08/17/2018 20:43   Ct Angio Chest Pe W And/or Wo Contrast  Result Date: 08/18/2018 CLINICAL DATA:  Acute onset substernal chest pain, shortness of breath. RIGHT leg numbness tonight. History of pulmonary embolism, DVT, off anticoagulation. EXAM: CT ANGIOGRAPHY CHEST WITH CONTRAST TECHNIQUE: Multidetector CT imaging of the chest was performed  using the standard protocol during bolus administration of intravenous contrast. Multiplanar CT image reconstructions and MIPs were obtained to evaluate the vascular anatomy. CONTRAST:  ISOVUE-370 IOPAMIDOL (ISOVUE-370) INJECTION 76% COMPARISON:  Chest radiograph August 17, 2018 and CT angiogram chest May 10, 2018. FINDINGS: CARDIOVASCULAR: Adequate contrast opacification of the pulmonary artery's. Main pulmonary artery is not enlarged. Subcentimeter linear filling defect RIGHT upper lobar pulmonary artery. Heart size is mildly enlarged. Large LEFT ventricle filling defect. No pericardial effusion. Thoracic aorta is normal course and caliber, unremarkable. Pulmonary venous congestion. MEDIASTINUM/NODES: No lymphadenopathy by CT size criteria. LUNGS/PLEURA: Tracheobronchial tree is patent, no pneumothorax. Trace bronchial wall thickening seen with bronchitis, reactive airway or pulmonary edema. Scattered tree-in-bud infiltrates/ground-glass nodules with upper lobe predominance. No pleural effusion. UPPER ABDOMEN: Non-acute. MUSCULOSKELETAL: Non-acute. Mild degenerative changes lower thoracic spine. Review of the MIP images confirms the above findings. IMPRESSION: 1. Small nonocclusive RIGHT upper lobar pulmonary embolism. No RIGHT heart strain. 2. 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 3. Tree-in-bud infiltrates/ground-glass nodules are likely infectious. 4. Mild cardiomegaly and pulmonary vascular congestion. 5. Critical Value/emergent results were called by telephone at the time of interpretation on 08/18/2018 at 1:53 am to Dr. Elesa Massed, who verbally acknowledged these results. Electronically Signed   By: Awilda Metro M.D.   On: 08/18/2018 01:54   I have independently reviewed the above radiology studies  and reviewed the findings with the patient.  Lab Results  Component Value Date   CKTOTAL 310 (H) 03/02/2010   CKMB 3.8 03/02/2010   TROPONINI 0.09 (HH) 05/10/2018   EKG-  ?old anterior MI  ATTENDING    Meridee Score, MD  SONOGRAPHER  Delcie Roch, RDCS, CCT  PERFORMING   [facility], Inpatient  ADMITTING    Opyd, Timothy S  ORDERING     Opyd, Timothy S  cc:  ------------------------------------------------------------------- LV EF: 35% -   40%  ------------------------------------------------------------------- History:   PMH:  Left ventricular clot.  Risk factors:  Diabetes mellitus.  ------------------------------------------------------------------- Study Conclusions  - Left ventricle: The cavity size was normal. Wall thickness was   normal. Systolic function was moderately reduced. The estimated   ejection fraction was in the range of 35% to 40%. There is   akinesis of the apical myocardium. Doppler parameters are   consistent with abnormal left ventricular relaxation (grade 1   diastolic dysfunction). There was an apparent, large,   apicalthrombus.  Impressions:  - Akinesis of apex with overall moderate LV dysfunction; mild   diastolic dysfunction; large, mobile density originating from LV   apex extending towards aortic valve  consistent with thrombus.  ------------------------------------------------------------------- Study data:  Comparison was made to the study of 05/10/2018.  Study status:  Routine.  Procedure:  The patient reported no pain pre or post test. Transthoracic echocardiography. Image quality was adequate.  Study completion:  There were no complications. Transthoracic echocardiography.  M-mode, complete 2D, spectral Doppler, and color Doppler.  Birthdate:  Patient birthdate: September 12, 1970.  Age:  Patient is 47 yr old.  Sex:  Gender: male. BMI: 42.3 kg/m^2.  Blood pressure:     114/74  Patient status: Inpatient.  Study date:  Study date: 08/18/2018. Study time: 11:52 AM.  Location:  Echo  laboratory.  -------------------------------------------------------------------  ------------------------------------------------------------------- Left ventricle:  The cavity size was normal. Wall thickness was normal. Systolic function was moderately reduced. The estimated ejection fraction was in the range of 35% to 40%. There was an apparent, large, apicalthrombus.  Regional wall motion abnormalities:   There is akinesis of the apical myocardium. Doppler parameters are consistent with abnormal left ventricular relaxation (grade 1 diastolic dysfunction).  ------------------------------------------------------------------- Aortic valve:   Trileaflet; normal thickness leaflets. Mobility was not restricted.  Doppler:  Transvalvular velocity was within the normal range. There was no stenosis. There was no regurgitation.   ------------------------------------------------------------------- Aorta:  Aortic root: The aortic root was normal in size.  ------------------------------------------------------------------- Mitral valve:   Structurally normal valve.   Mobility was not restricted.  Doppler:  Transvalvular velocity was within the normal range. There was no evidence for stenosis. There was no regurgitation.  ------------------------------------------------------------------- Left atrium:  The atrium was normal in size.  ------------------------------------------------------------------- Right ventricle:  The cavity size was normal. Systolic function was normal.  ------------------------------------------------------------------- Pulmonic valve:    Doppler:  Transvalvular velocity was within the normal range. There was no evidence for stenosis.  ------------------------------------------------------------------- Tricuspid valve:   Structurally normal valve.    Doppler: Transvalvular velocity was within the normal range. There was  no regurgitation.  ------------------------------------------------------------------- Right atrium:  The atrium was normal in size.  ------------------------------------------------------------------- Pericardium:  There was no pericardial effusion.  ------------------------------------------------------------------- Measurements   Left ventricle                         Value        Reference  LV ID, ED, PLAX chordal                49    mm     43 - 52  LV ID, ES, PLAX chordal                35    mm     23 - 38  LV fx shortening, PLAX chordal         29    %      >=29  LV PW thickness, ED                    11    mm     ----------  IVS/LV PW ratio, ED                    1            <=1.3  Stroke volume, 2D                      88    ml     ----------  Stroke volume/bsa, 2D  33    ml/m^2 ----------  LV e&', lateral                         8.59  cm/s   ----------  LV E/e&', lateral                       7.78         ----------  LV e&', medial                          7.4   cm/s   ----------  LV E/e&', medial                        9.03         ----------  LV e&', average                         8     cm/s   ----------  LV E/e&', average                       8.36         ----------    Ventricular septum                     Value        Reference  IVS thickness, ED                      11    mm     ----------    LVOT                                   Value        Reference  LVOT ID, S                             22    mm     ----------  LVOT area                              3.8   cm^2   ----------  LVOT peak velocity, S                  88.8  cm/s   ----------  LVOT mean velocity, S                  59    cm/s   ----------  LVOT VTI, S                            23.2  cm     ----------  LVOT peak gradient, S                  3     mm Hg  ----------    Aorta                                  Value        Reference  Aortic root ID, ED  31     mm     ----------    Left atrium                            Value        Reference  LA ID, A-P, ES                         42    mm     ----------  LA ID/bsa, A-P                         1.56  cm/m^2 <=2.2  LA volume, S                           53.2  ml     ----------  LA volume/bsa, S                       19.8  ml/m^2 ----------  LA volume, ES, 1-p A4C                 42.2  ml     ----------  LA volume/bsa, ES, 1-p A4C             15.7  ml/m^2 ----------  LA volume, ES, 1-p A2C                 63.7  ml     ----------  LA volume/bsa, ES, 1-p A2C             23.7  ml/m^2 ----------    Mitral valve                           Value        Reference  Mitral E-wave peak velocity            66.8  cm/s   ----------  Mitral A-wave peak velocity            63.4  cm/s   ----------  Mitral deceleration time       (H)     280   ms     150 - 230  Mitral E/A ratio, peak                 1.1          ----------    Right atrium                           Value        Reference  RA ID, S-I, ES, A4C            (H)     54.1  mm     34 - 49  RA area, ES, A4C                       14.1  cm^2   8.3 - 19.5  RA volume, ES, A/L                     31.4  ml     ----------  RA volume/bsa, ES, A/L                 11.7  ml/m^2 ----------    Systemic veins                         Value        Reference  Estimated CVP                          3     mm Hg  ----------    Right ventricle                        Value        Reference  TAPSE                                  33    mm     ----------  RV s&', lateral, S                      10.1  cm/s   ----------  Legend: (L)  and  (H)  mark values outside specified reference range.  ------------------------------------------------------------------- Prepared and Electronically Authenticated by  Olga Millers 2019-12-21T13:17:09  This echo was compared to the echocardiogram done September 2019, on the earlier echo the apex does seem to be hypokinetic, difficult  to tell if there was clot or mass there at that time but cannot be ruled out, but definitely has progressed to be approximately 5 cm x 1.5 cm attached to the apex   Assessment/Plan: Patient discussed with cardiology,  To undergo right leg embolectomy, continue heparin afterwards, send any material from embolectomy to pathology Cardiology will arrange for cardiac MRI, may need cardiac catheterization to rule out coronary artery with anterior infarct disease as the instigating source for apical clot  Delight Ovens MD 08/18/2018 2:44 PM

## 2018-08-18 NOTE — Progress Notes (Signed)
Patient transported to Vascular lab with transporter via stretcher.

## 2018-08-18 NOTE — Care Management Note (Signed)
Case Management Note  Patient Details  Name: Levonne SpillerWilliam L Raisanen Jr. MRN: [id number] Date of Birth: Jul 28, 1971  Subjective/Objective:    Large LV Thrombus               Action/Plan: [facility]; has private insurance with [employer] with prescription drug coverage; unable to do benefit check for NOAC due to insurance co closed on weekend; CM will continue to follow for progression of care.   Expected Discharge Date:    Undetermined at this time              Expected Discharge Plan:   Home  Status of Service:   In progress  Reola MosherChandler, Tamila Gaulin L, RN,MHA,BSN [phone] 08/18/2018, 11:21 AM
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
## 2018-08-18 NOTE — Progress Notes (Signed)
Pt received from ED. Report taken from Ivonne AndrewAndrew Powell, RN. CHG and Assessment complete. Tele box connected CCMD notified. Patient oriented to room and call bell system. Will continue to monitor.

## 2018-08-18 NOTE — ED Notes (Signed)
Delay in lab draw,  MD at bedside. 

## 2018-08-18 NOTE — Progress Notes (Signed)
ANTICOAGULATION CONSULT NOTE  Pharmacy Consult for Heparin  Indication: pulmonary embolus and DVT  Allergies  Allergen Reactions  . Contrast Media [Iodinated Diagnostic Agents] Itching    Pt states he thinks it makes him itch  . Lovenox [Enoxaparin Sodium] Itching    Itching over entire body  . Percocet [Oxycodone-Acetaminophen] Hives and Itching    Patient Measurements: ~135 kg  Vital Signs: Temp: 98.2 F (36.8 C) (12/21 2008) Temp Source: Oral (12/21 2008) BP: 124/81 (12/21 2008) Pulse Rate: 56 (12/21 2008)  Labs: Recent Labs    08/17/18 1925 08/18/18 0643 08/18/18 0930 08/18/18 1808 08/18/18 2020  HGB 15.5  --   --   --   --   HCT 46.7  --   --   --   --   PLT 408*  --   --   --   --   HEPARINUNFRC  --   --  0.11*  --  0.46  CREATININE 1.20 1.02  --   --   --   TROPONINI  --   --   --  0.04*  --     CrCl cannot be calculated (Unknown ideal weight.).    Assessment: 47 y/o M with recent history of DVT and PE, he had been on Xarelto until last month, unable to obtain due to financial reasons, now presents to the ED with chest pain, CT angio with PE and LV thrombus.   Now s/p embolectomy - > PM heparin level therapeutic  Goal of Therapy:  Heparin level 0.3-0.7 units/ml Monitor platelets by anticoagulation protocol: Yes   Plan:  Continue heparin at 2050 units / hr Daily CBC/HL Monitor for bleeding  Thank you Okey RegalLisa Drevin Ortner, PharmD [phone] 08/18/2018,9:05 PM

## 2018-08-18 NOTE — Anesthesia Preprocedure Evaluation (Signed)
Anesthesia Evaluation  Patient identified by MRN, date of birth, ID band Patient awake    Reviewed: Allergy & Precautions, NPO status , Patient's Chart, lab work & pertinent test results  Airway Mallampati: II  TM Distance: >3 FB Neck ROM: Full    Dental  (+) Teeth Intact, Dental Advisory Given   Pulmonary    breath sounds clear to auscultation       Cardiovascular  Rhythm:Regular Rate:Normal     Neuro/Psych    GI/Hepatic   Endo/Other  diabetes  Renal/GU      Musculoskeletal   Abdominal (+) + obese,   Peds  Hematology   Anesthesia Other Findings   Reproductive/Obstetrics                             Anesthesia Physical Anesthesia Plan  ASA: III and emergent  Anesthesia Plan: General   Post-op Pain Management:    Induction: Intravenous  PONV Risk Score and Plan: Ondansetron  Airway Management Planned: Oral ETT  Additional Equipment:   Intra-op Plan:   Post-operative Plan: Extubation in OR  Informed Consent: I have reviewed the patients History and Physical, chart, labs and discussed the procedure including the risks, benefits and alternatives for the proposed anesthesia with the patient or authorized representative who has indicated his/her understanding and acceptance.   Dental advisory given  Plan Discussed with: CRNA and Anesthesiologist  Anesthesia Plan Comments:         Anesthesia Quick Evaluation

## 2018-08-18 NOTE — Plan of Care (Signed)
Care plans reviewed and patient is progressing.  

## 2018-08-18 NOTE — ED Provider Notes (Signed)
2:11 AM  Patient care assumed from [facility] Petrucelli, PA-C at shift change. In short, patient is a 47 y/o male with hx of prior DVT and saddle PE currently off Xarelto x 1 month due to cost after loss of insurance. Presenting for sharp, substernal nonradiating chest pain with dyspnea, nausea, diaphoresis. Pain has been intermittent, he is pain free at this time. He was started on IV abx for concern for PNA based on CXR findings. Pending CTA at change of shift.  CT read as follows: IMPRESSION:  1. Small nonocclusive RIGHT upper lobar pulmonary embolism. No RIGHT  heart strain.  2. 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  3. Tree-in-bud infiltrates/ground-glass nodules are likely  infectious.  4. Mild cardiomegaly and pulmonary vascular congestion.  5. Critical Value/emergent results were called by telephone at the  time of interpretation on 08/18/2018 at 1:53 am to Dr. Elesa MassedWard, who  verbally acknowledged these results.   The patient is presently hemodynamically stable.  Patient was started on heparin.  Hypercoagulable labs to be drawn prior to initiation of heparin as these have not been performed previously.  The patient was made aware of these imaging results.  He is agreeable to admission.  I have discussed that, should a component of this clot dislodge, he is at high risk for cardiac arrest.  He wishes for all resuscitation efforts to be performed should this occur.  Of note, difficulty obtaining DP pulses by prior provider. These were 1+ and palpable bilaterally during my assessment of the patient; palpated also by Dr. Elesa MassedWard.  2:15 AM Case discussed with Dr. Darrick Pennaeterding of PCCM, including favored admission to ICU given size of large left ventricular thrombus.  PCCM to consult on patient's case to determine most appropriate area for admission.  4:07 AM The patient has been evaluated by critical care.  They do not believe the patient requires an ICU admission at this time. PCCM  recommends admission to stepdown ICU given normal troponin and absence of right heart strain. Likely also cardiology consultation and echo. Will discuss consultation with cardiology service as well as admission with [facility].  4:28 AM Spoke with Dr. Enid [facility] of [facility]iology. Cardiology service to consult during admission. [facility] paged for admission.  4:47 AM Case discussed with Dr. Antionette Charpyd who will admit.   Vitals:   08/18/18 0330 08/18/18 0400 08/18/18 0415 08/18/18 0430  BP: (!) 162/97 (!) 159/97 (!) 112/96   Pulse: 94 95 85 82  Resp: (!) 21 20 15 18   Temp:      TempSrc:      SpO2: 99% 98%  100%    CRITICAL CARE Performed by: Antony MaduraKelly Ascher Schroepfer   Total critical care time: 45 minutes  Critical care time was exclusive of separately billable procedures and treating other patients.  Critical care was necessary to treat or prevent imminent or life-threatening deterioration.  Critical care was time spent personally by me on the following activities: development of treatment plan with patient and/or surrogate as well as nursing, discussions with consultants, evaluation of patient's response to treatment, examination of patient, obtaining history from patient or surrogate, ordering and performing treatments and interventions, ordering and review of laboratory studies, ordering and review of radiographic studies, pulse oximetry and re-evaluation of patient's condition.     Antony MaduraHumes, Adalin Vanderploeg, PA-C 08/18/18 0448    Ward, Layla MawKristen N, DO 08/18/18 [id number]

## 2018-08-18 NOTE — Progress Notes (Signed)
  Echocardiogram 2D Echocardiogram has been performed.  Marcus Joseph, Marcus Joseph 08/18/2018, 2:09 PM
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
## 2018-08-18 NOTE — Progress Notes (Signed)
VASCULAR LAB PRELIMINARY  PRELIMINARY  PRELIMINARY  PRELIMINARY  Right lower extremity venous duplex completed.    Preliminary report:  There is no DVT or SVT noted in the right lower extremity.  Incidentally, there is no arterial flow noted in the mid to distal femoral artery, popliteal, posterior tibial, anterior tibial, peroneal, and dorsalis pedis arteries.    Called results to Dr. Amaryllis DykeAdhikari  Marcus Joseph, [facility] Hospital[facility]ANDACE, RVT 08/18/2018, 1:11 PM

## 2018-08-19 DIAGNOSIS — I240 Acute coronary thrombosis not resulting in myocardial infarction: Secondary | ICD-10-CM | POA: Diagnosis present

## 2018-08-19 DIAGNOSIS — I513 Intracardiac thrombosis, not elsewhere classified: Secondary | ICD-10-CM

## 2018-08-19 LAB — GLUCOSE, CAPILLARY
GLUCOSE-CAPILLARY: 241 mg/dL — AB (ref 70–99)
Glucose-Capillary: 197 mg/dL — ABNORMAL HIGH (ref 70–99)
Glucose-Capillary: 201 mg/dL — ABNORMAL HIGH (ref 70–99)
Glucose-Capillary: 202 mg/dL — ABNORMAL HIGH (ref 70–99)
Glucose-Capillary: 263 mg/dL — ABNORMAL HIGH (ref 70–99)
Glucose-Capillary: 334 mg/dL — ABNORMAL HIGH (ref 70–99)

## 2018-08-19 LAB — CBC WITH DIFFERENTIAL/PLATELET
Abs Immature Granulocytes: 0.04 10*3/uL (ref 0.00–0.07)
Basophils Absolute: 0 10*3/uL (ref 0.0–0.1)
Basophils Relative: 0 %
EOS ABS: 0 10*3/uL (ref 0.0–0.5)
Eosinophils Relative: 0 %
HCT: 35.7 % — ABNORMAL LOW (ref 39.0–52.0)
Hemoglobin: 11.3 g/dL — ABNORMAL LOW (ref 13.0–17.0)
Immature Granulocytes: 0 %
Lymphocytes Relative: 18 %
Lymphs Abs: 2 10*3/uL (ref 0.7–4.0)
MCH: 27.4 pg (ref 26.0–34.0)
MCHC: 31.7 g/dL (ref 30.0–36.0)
MCV: 86.4 fL (ref 80.0–100.0)
Monocytes Absolute: 0.8 10*3/uL (ref 0.1–1.0)
Monocytes Relative: 7 %
Neutro Abs: 8.3 10*3/uL — ABNORMAL HIGH (ref 1.7–7.7)
Neutrophils Relative %: 75 %
Platelets: 267 10*3/uL (ref 150–400)
RBC: 4.13 MIL/uL — ABNORMAL LOW (ref 4.22–5.81)
RDW: 13.4 % (ref 11.5–15.5)
WBC: 11.1 10*3/uL — ABNORMAL HIGH (ref 4.0–10.5)
nRBC: 0 % (ref 0.0–0.2)

## 2018-08-19 LAB — EXPECTORATED SPUTUM ASSESSMENT W GRAM STAIN, RFLX TO RESP C: Special Requests: NORMAL

## 2018-08-19 LAB — ANTITHROMBIN III: ANTITHROMB III FUNC: 67 % — AB (ref 75–120)

## 2018-08-19 LAB — HOMOCYSTEINE: Homocysteine: 10 umol/L (ref 0.0–15.0)

## 2018-08-19 LAB — PROTEIN C ACTIVITY: Protein C Activity: 125 % (ref 73–180)

## 2018-08-19 LAB — PROTEIN S, TOTAL: Protein S Ag, Total: 113 % (ref 60–150)

## 2018-08-19 LAB — HEPARIN LEVEL (UNFRACTIONATED)
Heparin Unfractionated: 0.15 IU/mL — ABNORMAL LOW (ref 0.30–0.70)
Heparin Unfractionated: 0.43 IU/mL (ref 0.30–0.70)

## 2018-08-19 LAB — PROTIME-INR
INR: 1.08
Prothrombin Time: 13.9 seconds (ref 11.4–15.2)

## 2018-08-19 LAB — PROTEIN S ACTIVITY: Protein S Activity: 78 % (ref 63–140)

## 2018-08-19 LAB — LUPUS ANTICOAGULANT PANEL
DRVVT: 39.8 s (ref 0.0–47.0)
PTT Lupus Anticoagulant: 30.2 s (ref 0.0–51.9)

## 2018-08-19 MED ORDER — LEVOFLOXACIN 750 MG PO TABS
750.0000 mg | ORAL_TABLET | Freq: Every day | ORAL | Status: DC
  Administered 2018-08-19 – 2018-08-23 (×5): 750 mg via ORAL
  Filled 2018-08-19 (×5): qty 1

## 2018-08-19 MED ORDER — ALUM & MAG HYDROXIDE-SIMETH 200-200-20 MG/5 ML NICU TOPICAL
1.0000 "application " | TOPICAL | Status: DC | PRN
  Filled 2018-08-19 (×2): qty 355

## 2018-08-19 MED ORDER — ALUM & MAG HYDROXIDE-SIMETH 200-200-20 MG/5ML PO SUSP
30.0000 mL | ORAL | Status: DC | PRN
  Filled 2018-08-19: qty 30

## 2018-08-19 NOTE — Progress Notes (Addendum)
ANTICOAGULATION CONSULT NOTE  Pharmacy Consult for Heparin  Indication: pulmonary embolus and DVT, LV thrombus  Allergies  Allergen Reactions  . Contrast Media [Iodinated Diagnostic Agents] Itching    Pt states he thinks it makes him itch  . Lovenox [Enoxaparin Sodium] Itching    Itching over entire body  . Percocet [Oxycodone-Acetaminophen] Hives and Itching    Patient Measurements: ~135 kg  Vital Signs: Temp: 98.5 F (36.9 C) (12/22 0800) Temp Source: Oral (12/22 0800) BP: 101/60 (12/22 0800) Pulse Rate: 86 (12/22 1300)  Labs: Recent Labs    08/17/18 1925 08/18/18 0643  08/18/18 1808 08/18/18 2020 08/19/18 0422 08/19/18 0708 08/19/18 1343  HGB 15.5  --   --   --   --   --  11.3*  --   HCT 46.7  --   --   --   --   --  35.7*  --   PLT 408*  --   --   --   --   --  267  --   LABPROT  --   --   --   --   --  13.9  --   --   INR  --   --   --   --   --  1.08  --   --   HEPARINUNFRC  --   --    < >  --  0.46 0.15*  --  0.43  CREATININE 1.20 1.02  --   --   --   --   --   --   TROPONINI  --   --   --  0.04*  --   --   --   --    < > = values in this interval not displayed.    CrCl cannot be calculated (Unknown ideal weight.).  Assessment: 47 y/o M with recent history of DVT and PE, he had been on Xarelto until last month, unable to obtain due to financial reasons, now presents to the ED with chest pain, CT angio with PE and LV thrombus. Now s/p embolectomy   Heparin level therapeutic (0.46) on gtt at 2250 units/hr. Hypercoagulable workup ongoing. Hgb and plt down. No overt bleeding noted.  Goal of Therapy:  Heparin level 0.3-0.7 units/ml Monitor platelets by anticoagulation protocol: Yes   Plan:  Continueheparin at2250 units/hr Daily CBC/HL Monitor for bleeding F/u hypercoagulable w/u and longterm AC plans  Thank you for involving pharmacy in this patient's care.  Christoper Fabianaron Lashawne Dura, PharmD, BCPS Clinical pharmacist  **Pharmacist phone directory can now be  found on [email or web address] (PW [email or web address]).  Listed under [facility]. 08/19/2018 3:20 PM

## 2018-08-19 NOTE — Progress Notes (Signed)
DAILY PROGRESS NOTE   Patient Name: Marcus Joseph. Date of Encounter: 08/19/2018  Chief Complaint   Leg feels better  Patient Profile   [name] Marcus Joseph. is a 47 y.o. male with a hx of recurrent DVT/PE, DM2, GERD, arthritis who is being seen today for the evaluation of LV thrombus at the request of Dr. Elesa Massed.  Subjective   Underwent surgery yesterday for ischemic leg from presumed LV thrombus - doing better this am. Echo shows LVEF 35-40% with large, mobile LV thrombus. Anticoagulated on heparin. Plan for cMRI this week to evaluate thrombus vs tumor.  Objective   Vitals:   08/18/18 2008 08/18/18 2312 08/19/18 0433 08/19/18 0800  BP: 124/81 90/60 (!) 98/55 101/60  Pulse: (!) 56 88 79   Resp: (!) 23 20 19    Temp: 98.2 F (36.8 C) 98.2 F (36.8 C) 98.8 F (37.1 C) 98.5 F (36.9 C)  TempSrc: Oral Oral Oral Oral  SpO2: 98% 97% 95% 97%    Intake/Output Summary (Last 24 hours) at 08/19/2018 1051 Last data filed at 08/19/2018 1610 Gross per 24 hour  Intake 3804.78 ml  Output 3653 ml  Net 151.78 ml   There were no vitals filed for this visit.  Physical Exam   General appearance: alert and no distress Lungs: clear to auscultation bilaterally Heart: regular rate and rhythm, S1, S2 normal, no murmur, click, rub or gallop Extremities: extremities normal, atraumatic, no cyanosis or edema Neurologic: Grossly normal  Inpatient Medications    Scheduled Meds: . insulin aspart  0-9 Units Subcutaneous Q4H  . insulin glargine  15 Units Subcutaneous QHS  . levofloxacin  750 mg Oral Daily  . sodium chloride flush  3 mL Intravenous Q12H    Continuous Infusions: . heparin 2,250 Units/hr (08/19/18 0822)    PRN Meds: acetaminophen **OR** acetaminophen, alum & mag hydroxide-simeth, fentaNYL (SUBLIMAZE) injection, ondansetron **OR** ondansetron (ZOFRAN) IV, senna-docusate   Labs   Results for orders placed or performed during the hospital encounter of 08/17/18  (from the past 48 hour(s))  Basic metabolic panel     Status: Abnormal   Collection Time: 08/17/18  7:25 PM  Result Value Ref Range   Sodium 137 135 - 145 mmol/L   Potassium 3.6 3.5 - 5.1 mmol/L   Chloride 98 98 - 111 mmol/L   CO2 20 (L) 22 - 32 mmol/L   Glucose, Bld 280 (H) 70 - 99 mg/dL   BUN 8 6 - 20 mg/dL   Creatinine, Ser 9.60 0.61 - 1.24 mg/dL   Calcium 8.8 (L) 8.9 - 10.3 mg/dL   GFR calc non Af Amer >60 >60 mL/min   GFR calc Af Amer >60 >60 mL/min   Anion gap 19 (H) 5 - 15    Comment: Performed at [facility], [address], [place], [place] [address]  CBC     Status: Abnormal   Collection Time: 08/17/18  7:25 PM  Result Value Ref Range   WBC 13.8 (H) 4.0 - 10.5 K/uL   RBC 5.49 4.22 - 5.81 MIL/uL   Hemoglobin 15.5 13.0 - 17.0 g/dL   HCT 81.1 91.4 - 78.2 %   MCV 85.1 80.0 - 100.0 fL   MCH 28.2 26.0 - 34.0 pg   MCHC 33.2 30.0 - 36.0 g/dL   RDW 95.6 21.3 - 08.6 %   Platelets 408 (H) 150 - 400 K/uL   nRBC 0.0 0.0 - 0.2 %    Comment: Performed at [facility], [address], [place], [place] [address]  I-stat troponin, ED     Status: None   Collection Time: 08/17/18  7:30 PM  Result Value Ref Range   Troponin i, poc 0.05 0.00 - 0.08 ng/mL   Comment 3            Comment: Due to the release kinetics of cTnI, a negative result within the first hours of the onset of symptoms does not rule out myocardial infarction with certainty. If myocardial infarction is still suspected, repeat the test at appropriate intervals.   Urinalysis, Routine w reflex microscopic     Status: Abnormal   Collection Time: 08/17/18  9:39 PM  Result Value Ref Range   Color, Urine YELLOW YELLOW   APPearance CLEAR CLEAR   Specific Gravity, Urine >1.030 (H) 1.005 - 1.030   pH 5.5 5.0 - 8.0   Glucose, UA 100 (A) NEGATIVE mg/dL   Hgb urine dipstick TRACE (A) NEGATIVE   Bilirubin Urine NEGATIVE NEGATIVE   Ketones, ur NEGATIVE NEGATIVE mg/dL   Protein, ur 604 (A) NEGATIVE mg/dL    Nitrite NEGATIVE NEGATIVE   Leukocytes, UA NEGATIVE NEGATIVE    Comment: Performed at [facility], [address] [place], [place] [address]  Urinalysis, Microscopic (reflex)     Status: Abnormal   Collection Time: 08/17/18  9:39 PM  Result Value Ref Range   RBC / HPF 0-5 0 - 5 RBC/hpf   WBC, UA 0-5 0 - 5 WBC/hpf   Bacteria, UA FEW (A) NONE SEEN   Squamous Epithelial / LPF 0-5 0 - 5   Hyaline Casts, UA PRESENT     Comment: Performed at [facility], [address] [place], [place] [address]  Antithrombin III     Status: None   Collection Time: 08/18/18  2:09 AM  Result Value Ref Range   AntiThromb III Func 87 75 - 120 %    Comment: Performed at [facility], [address] [place], [place] [address]  Strep pneumoniae urinary antigen     Status: None   Collection Time: 08/18/18  5:01 AM  Result Value Ref Range   Strep Pneumo Urinary Antigen NEGATIVE NEGATIVE    Comment:        Infection due to S. pneumoniae cannot be absolutely ruled out since the antigen present may be below the detection limit of the test.   CBG monitoring, ED     Status: Abnormal   Collection Time: 08/18/18  5:55 AM  Result Value Ref Range   Glucose-Capillary 263 (H) 70 - 99 mg/dL  Basic metabolic panel     Status: Abnormal   Collection Time: 08/18/18  6:43 AM  Result Value Ref Range   Sodium 136 135 - 145 mmol/L   Potassium 3.8 3.5 - 5.1 mmol/L   Chloride 100 98 - 111 mmol/L   CO2 22 22 - 32 mmol/L   Glucose, Bld 266 (H) 70 - 99 mg/dL   BUN 7 6 - 20 mg/dL   Creatinine, Ser 6.21 0.61 - 1.24 mg/dL   Calcium 8.4 (L) 8.9 - 10.3 mg/dL   GFR calc non Af Amer >60 >60 mL/min   GFR calc Af Amer >60 >60 mL/min   Anion gap 14 5 - 15    Comment: Performed at [facility], [address], [place], [place] [address]  Heparin level (unfractionated)     Status: Abnormal   Collection Time: 08/18/18  9:30 AM  Result Value Ref Range   Heparin Unfractionated 0.11 (L) 0.30 - 0.70 IU/mL     Comment: (NOTE) If heparin results are below expected values, and patient dosage has  been confirmed, suggest follow up testing of antithrombin III levels. Performed at [facility], [address] [place], [place] [address]   Glucose, capillary     Status: Abnormal   Collection Time: 08/18/18 11:19 AM  Result Value Ref Range   Glucose-Capillary 163 (H) 70 - 99 mg/dL   Comment 1 Notify RN    Comment 2 Document in Chart   Type and screen [facility]     Status: None   Collection Time: 08/18/18  2:51 PM  Result Value Ref Range   ABO/RH(D) B POS    Antibody Screen NEG    Sample Expiration      08/21/2018 Performed at [facility], [address], [place], [place] [address]   ABO/Rh     Status: None   Collection Time: 08/18/18  2:51 PM  Result Value Ref Range   ABO/RH(D)      B POS Performed at [facility], [address], [place], [place] [address]   Glucose, capillary     Status: Abnormal   Collection Time: 08/18/18  2:59 PM  Result Value Ref Range   Glucose-Capillary 161 (H) 70 - 99 mg/dL  Glucose, capillary     Status: Abnormal   Collection Time: 08/18/18  5:31 PM  Result Value Ref Range   Glucose-Capillary 193 (H) 70 - 99 mg/dL  Troponin I - Once     Status: Abnormal   Collection Time: 08/18/18  6:08 PM  Result Value Ref Range   Troponin I 0.04 (HH) <0.03 ng/mL    Comment: CRITICAL RESULT CALLED TO, READ BACK BY AND VERIFIED WITH: Hazle Quant RN @ 1938 ON 08/18/18 Caleen Essex, H Performed at [facility], [address], [place], [place] [address]   Glucose, capillary     Status: Abnormal   Collection Time: 08/18/18  6:41 PM  Result Value Ref Range   Glucose-Capillary 236 (H) 70 - 99 mg/dL   Comment 1 Notify RN    Comment 2 Document in Chart   Glucose, capillary     Status: Abnormal   Collection Time: 08/18/18  8:04 PM  Result Value Ref Range   Glucose-Capillary 257 (H) 70 - 99 mg/dL  Heparin level (unfractionated)      Status: None   Collection Time: 08/18/18  8:20 PM  Result Value Ref Range   Heparin Unfractionated 0.46 0.30 - 0.70 IU/mL    Comment: (NOTE) If heparin results are below expected values, and patient dosage has  been confirmed, suggest follow up testing of antithrombin III levels. Performed at [facility], [address] [place], [place] [address]   Culture, sputum-assessment     Status: None   Collection Time: 08/18/18 10:19 PM  Result Value Ref Range   Specimen Description EXPECTORATED SPUTUM    Special Requests Normal    Sputum evaluation      THIS SPECIMEN IS ACCEPTABLE FOR SPUTUM CULTURE Performed at [facility], [address], [place], [place] [address]    Report Status 08/19/2018 FINAL   Culture, respiratory     Status: None (Preliminary result)   Collection Time: 08/18/18 10:19 PM  Result Value Ref Range   Specimen Description EXPECTORATED SPUTUM    Special  Requests Normal Reflexed from [id number]    Gram Stain      RARE WBC PRESENT, PREDOMINANTLY PMN RARE GRAM POSITIVE RODS Performed at [facility], [address] [place], [place] [address]    Culture PENDING    Report Status PENDING   Glucose, capillary     Status: Abnormal   Collection Time: 08/18/18 11:58 PM  Result Value Ref Range   Glucose-Capillary 334 (H) 70 - 99 mg/dL  Glucose, capillary     Status: Abnormal   Collection Time: 08/19/18  4:00 AM  Result Value Ref Range   Glucose-Capillary 202 (H) 70 - 99 mg/dL  Protime-INR     Status: None   Collection Time: 08/19/18  4:22 AM  Result Value Ref Range   Prothrombin Time 13.9 11.4 - 15.2 seconds   INR 1.08     Comment: Performed at [facility], [address] [place], [place] [address]  Heparin level (unfractionated)     Status: Abnormal   Collection Time: 08/19/18  4:22 AM  Result Value Ref Range   Heparin Unfractionated 0.15 (L) 0.30 - 0.70 IU/mL    Comment: (NOTE) If heparin results are below expected values, and patient  dosage has  been confirmed, suggest follow up testing of antithrombin III levels. Performed at [facility], [address] [place], [place] [address]   CBC with Differential/Platelet     Status: Abnormal   Collection Time: 08/19/18  7:08 AM  Result Value Ref Range   WBC 11.1 (H) 4.0 - 10.5 K/uL   RBC 4.13 (L) 4.22 - 5.81 MIL/uL   Hemoglobin 11.3 (L) 13.0 - 17.0 g/dL    Comment: REPEATED TO VERIFY   HCT 35.7 (L) 39.0 - 52.0 %   MCV 86.4 80.0 - 100.0 fL   MCH 27.4 26.0 - 34.0 pg   MCHC 31.7 30.0 - 36.0 g/dL   RDW 91.413.4 78.211.5 - 95.615.5 %   Platelets 267 150 - 400 K/uL   nRBC 0.0 0.0 - 0.2 %   Neutrophils Relative % 75 %   Neutro Abs 8.3 (H) 1.7 - 7.7 K/uL   Lymphocytes Relative 18 %   Lymphs Abs 2.0 0.7 - 4.0 K/uL   Monocytes Relative 7 %   Monocytes Absolute 0.8 0.1 - 1.0 K/uL   Eosinophils Relative 0 %   Eosinophils Absolute 0.0 0.0 - 0.5 K/uL   Basophils Relative 0 %   Basophils Absolute 0.0 0.0 - 0.1 K/uL   Immature Granulocytes 0 %   Abs Immature Granulocytes 0.04 0.00 - 0.07 K/uL    Comment: Performed at [facility], [address] [place], [place] [address]  Glucose, capillary     Status: Abnormal   Collection Time: 08/19/18  7:48 AM  Result Value Ref Range   Glucose-Capillary 201 (H) 70 - 99 mg/dL   Comment 1 Notify RN    Comment 2 Document in Chart     ECG   N/A  Telemetry   Sinus rhythm - Personally Reviewed  Radiology    Dg Chest 2 View  Result Date: 08/17/2018 CLINICAL DATA:  Shortness of breath. Chest pain. Left lower extremity pain. EXAM: CHEST - 2 VIEW COMPARISON:  Two-view chest x-ray 05/29/2018 FINDINGS: Heart size is normal. Patchy airspace opacities are present in the perihilar regions bilaterally. Mild bibasilar atelectasis is noted. The visualized soft tissues and bony thorax are unremarkable. IMPRESSION: 1. Patchy airspace disease in the right greater than left upper  lobe concerning for early infection. Electronically Signed   By:  Marin Roberts M.D.   On: 08/17/2018 20:43   Ct Angio Chest Pe W And/or Wo Contrast  Result Date: 08/18/2018 CLINICAL DATA:  Acute onset substernal chest pain, shortness of breath. RIGHT leg numbness tonight. History of pulmonary embolism, DVT, off anticoagulation. EXAM: CT ANGIOGRAPHY CHEST WITH CONTRAST TECHNIQUE: Multidetector CT imaging of the chest was performed using the standard protocol during bolus administration of intravenous contrast. Multiplanar CT image reconstructions and MIPs were obtained to evaluate the vascular anatomy. CONTRAST:  ISOVUE-370 IOPAMIDOL (ISOVUE-370) INJECTION 76% COMPARISON:  Chest radiograph August 17, 2018 and CT angiogram chest May 10, 2018. FINDINGS: CARDIOVASCULAR: Adequate contrast opacification of the pulmonary artery's. Main pulmonary artery is not enlarged. Subcentimeter linear filling defect RIGHT upper lobar pulmonary artery. Heart size is mildly enlarged. Large LEFT ventricle filling defect. No pericardial effusion. Thoracic aorta is normal course and caliber, unremarkable. Pulmonary venous congestion. MEDIASTINUM/NODES: No lymphadenopathy by CT size criteria. LUNGS/PLEURA: Tracheobronchial tree is patent, no pneumothorax. Trace bronchial wall thickening seen with bronchitis, reactive airway or pulmonary edema. Scattered tree-in-bud infiltrates/ground-glass nodules with upper lobe predominance. No pleural effusion. UPPER ABDOMEN: Non-acute. MUSCULOSKELETAL: Non-acute. Mild degenerative changes lower thoracic spine. Review of the MIP images confirms the above findings. IMPRESSION: 1. Small nonocclusive RIGHT upper lobar pulmonary embolism. No RIGHT heart strain. 2. 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 3. Tree-in-bud infiltrates/ground-glass nodules are likely infectious. 4. Mild cardiomegaly and pulmonary vascular congestion. 5. Critical Value/emergent results were called by telephone at the time of interpretation on 08/18/2018  at 1:53 am to Dr. Elesa Massed, who verbally acknowledged these results. Electronically Signed   By: Awilda Metro M.D.   On: 08/18/2018 01:54   Vas [place] Lower Extremity Venous (dvt)  Result Date: 08/19/2018  Lower Venous Study Indications: Pain, and Large mobile thrombus in LV.  Performing Technologist: Sherren Kerns RVS  Examination Guidelines: A complete evaluation includes B-mode imaging, spectral Doppler, color Doppler, and power Doppler as needed of all accessible portions of each vessel. Bilateral testing is considered an integral part of a complete examination. Limited examinations for reoccurring indications may be performed as noted.  Right Venous Findings: +---------+---------------+---------+-----------+----------+-------+          CompressibilityPhasicitySpontaneityPropertiesSummary +---------+---------------+---------+-----------+----------+-------+ CFV      Full           Yes      Yes                          +---------+---------------+---------+-----------+----------+-------+ SFJ      Full                                                 +---------+---------------+---------+-----------+----------+-------+ FV Prox  Full                                                 +---------+---------------+---------+-----------+----------+-------+ FV Mid   Full                                                 +---------+---------------+---------+-----------+----------+-------+  FV DistalFull                                                 +---------+---------------+---------+-----------+----------+-------+ PFV      Full                                                 +---------+---------------+---------+-----------+----------+-------+ POP      Full           Yes      Yes                          +---------+---------------+---------+-----------+----------+-------+ PTV      Full                                                  +---------+---------------+---------+-----------+----------+-------+ PERO     Full                                                 +---------+---------------+---------+-----------+----------+-------+ GSV      Full                                                 +---------+---------------+---------+-----------+----------+-------+  Left Venous Findings: +---+---------------+---------+-----------+----------+-------+    CompressibilityPhasicitySpontaneityPropertiesSummary +---+---------------+---------+-----------+----------+-------+ CFVFull           Yes      Yes                          +---+---------------+---------+-----------+----------+-------+    Summary: Right: There is no evidence of deep vein thrombosis in the lower extremity. There is no evidence of superficial venous thrombosis. Incidentally, there is no arterial flow noted in the mid to distal femoral artery, popliteal, posterior tibial, anterior tibial, peroneal, and dorsalis pedis arteries. Left: No evidence of common femoral vein obstruction.  *See table(s) above for measurements and observations. Electronically signed by Sherald Hess MD on 08/19/2018 at 10:39:06 AM.    Final     Cardiac Studies   LV EF: 35% -   40%  ------------------------------------------------------------------- History:   PMH:  Left ventricular clot.  Risk factors:  Diabetes mellitus.  ------------------------------------------------------------------- Study Conclusions  - Left ventricle: The cavity size was normal. Wall thickness was   normal. Systolic function was moderately reduced. The estimated   ejection fraction was in the range of 35% to 40%. There is   akinesis of the apical myocardium. Doppler parameters are   consistent with abnormal left ventricular relaxation (grade 1   diastolic dysfunction). There was an apparent, large,   apicalthrombus.  Impressions:  - Akinesis of apex with overall moderate LV  dysfunction; mild   diastolic dysfunction; large, mobile density originating from LV   apex extending towards aortic valve consistent with thrombus.  Assessment  1. Principal Problem: 2.   Left ventricular thrombus without MI 3. Active Problems: 4.   Pulmonary embolism (HCC) 5.   Type 2 diabetes mellitus with hyperglycemia (HCC) 6.   CAP (community acquired pneumonia) 7.   Left ventricular mural thrombus without myocardial infarction 8.   Plan   1. No issues overnight- leg feels better today. Echo shows LVEF 35-40% with presumed large LV thrombus vs possible tumor. There was also saddle PE in 04/2018 - but no mention of thrombus or indication on CT of this. Review of prior CT and echo in September suggest there was thrombus at the time, but not reported. Overall, this is concerning for arterial and venous procoagulant disorder. Will send studies for hypercoagulable work-up. Plan for possible cMRI on Monday - he is claustrophobic and will likely need sedation.  Time Spent Directly with Patient:  I have spent a total of 25 minutes with the patient reviewing hospital notes, telemetry, EKGs, labs and examining the patient as well as establishing an assessment and plan that was discussed personally with the patient.  > 50% of time was spent in direct patient care.  Length of Stay:  LOS: 0 days   Chrystie NoseKenneth C. Jaymes Hang, MD, [facility]FACC, FACP  [place]  [facility]  Medical Director of the [facility] Lipid [facility] &  Cardiovascular Risk Reduction Clinic Diplomate of the American Board of Clinical Lipidology Attending Cardiologist  Direct Dial: [phone]  Fax: [phone]  Website:  [email or web address]  Denton Derks C Fabiha Rougeau 08/19/2018, 10:51 AM

## 2018-08-19 NOTE — Progress Notes (Addendum)
[facility] [place]  Subjective  -  Doing well this am.   Objective 101/60 79 98.5 F (36.9 C) (Oral) 19 97%  Intake/Output Summary (Last 24 hours) at 08/19/2018 0848 Last data filed at 08/19/2018 [id number] Gross per 24 hour  Intake 3804.78 ml  Output 3653 ml  Net 151.78 ml    Right DP palpable with brisk signals on doppler Biphasic DP/PT Right groin incision healing well JP drain in place OP 28 cc Heart RRR Lungs non labored breathing  Assessment/Planning: POD # 1  Procedure: 1.  Right iliac, femoral, popliteal, and tibial thromboembolectomy via right femoral cutdown 2. 19 Fr Blake drain placed in the right groin   Continue Heparin full dose. Patent arterial blood flow with palpable DP right LE. Dry guaze to right groin to keep incision clean and dry. Maintain drain until discharged.  Mosetta Pigeonmma Maureen Collins 08/19/2018 8:48 AM --  Laboratory Lab Results: Recent Labs    08/17/18 1925 08/19/18 0708  WBC 13.8* 11.1*  HGB 15.5 11.3*  HCT 46.7 35.7*  PLT 408* 267   BMET Recent Labs    08/17/18 1925 08/18/18 0643  NA 137 136  K 3.6 3.8  CL 98 100  CO2 20* 22  GLUCOSE 280* 266*  BUN 8 7  CREATININE 1.20 1.02  CALCIUM 8.8* 8.4*    COAG Lab Results  Component Value Date   INR 1.08 08/19/2018   INR 0.97 05/26/2014   No results found for: PTT  I have seen and evaluated the patient. I agree with the PA note as documented above. Doing well POD#1 s/p RLE thrombectomy.  Palpable DP pulse.  Foot warm.  Motor intact and sensation improving.  Continue heparin gtt.  Calf soft and no fasciotomies performed.   Cephus Shellinghristopher J. Diani Jillson, MD [facility] [place] Office: [phone] Pager: [phone]

## 2018-08-19 NOTE — Anesthesia Postprocedure Evaluation (Signed)
Anesthesia Post Note  Patient: Marcus SpillerWilliam L Pascuzzi Jr.  Procedure(s) Performed: RIGHT LOWER EXTREMITY THROMBECTOMY (Right )     Patient location during evaluation: PACU Anesthesia Type: General Level of consciousness: awake and alert Pain management: pain level controlled Vital Signs Assessment: post-procedure vital signs reviewed and stable Respiratory status: spontaneous breathing, nonlabored ventilation, respiratory function stable and patient connected to nasal cannula oxygen Cardiovascular status: blood pressure returned to baseline and stable Postop Assessment: no apparent nausea or vomiting Anesthetic complications: no    Last Vitals:  Vitals:   08/19/18 1700 08/19/18 1948  BP:  119/61  Pulse:  88  Resp: (!) 25 (!) 25  Temp:  37.1 C  SpO2:  98%    Last Pain:  Vitals:   08/19/18 2000  TempSrc:   PainSc: 5                  Seline Enzor COKER

## 2018-08-19 NOTE — Progress Notes (Signed)
[address]       [place] [address]             [phone]                 1 Day Post-Op Procedure(s) (LRB): RIGHT LOWER EXTREMITY THROMBECTOMY (Right)  LOS: 0 days   Subjective: Feels well this am  Objective: Vital signs in last 24 hours: Patient Vitals for the past 24 hrs:  BP Temp Temp src Pulse Resp SpO2  08/19/18 0800 101/60 98.5 F (36.9 C) Oral - - 97 %  08/19/18 0433 (!) 98/55 98.8 F (37.1 C) Oral 79 19 95 %  08/18/18 2312 90/60 98.2 F (36.8 C) Oral 88 20 97 %  08/18/18 2008 124/81 98.2 F (36.8 C) Oral (!) 56 (!) 23 98 %  08/18/18 1821 - 97.9 F (36.6 C) - 81 19 99 %  08/18/18 1813 110/62 - - 80 18 98 %  08/18/18 1800 112/67 - - 80 20 96 %  08/18/18 1745 122/71 - - 87 18 97 %  08/18/18 1735 - - - 84 13 97 %  08/18/18 1729 113/69 97.8 F (36.6 C) - 88 15 97 %    There were no vitals filed for this visit.  Hemodynamic parameters for last 24 hours:    Intake/Output from previous day: 12/21 0701 - 12/22 0700 In: 3564.8 [I.V.:2714.8; IV Piggyback:850] Out: 3203 [Urine:2525; Drains:28; Blood:650] Intake/Output this shift: Total I/O In: 639.3 [P.O.:480; I.V.:159.3] Out: 450 [Urine:450]  Scheduled Meds: . insulin aspart  0-9 Units Subcutaneous Q4H  . insulin glargine  15 Units Subcutaneous QHS  . levofloxacin  750 mg Oral Daily  . sodium chloride flush  3 mL Intravenous Q12H   Continuous Infusions: . heparin 2,250 Units/hr (08/19/18 0822)   PRN Meds:.acetaminophen **OR** acetaminophen, alum & mag hydroxide-simeth, fentaNYL (SUBLIMAZE) injection, ondansetron **OR** ondansetron (ZOFRAN) IV, senna-docusate  General appearance: alert and cooperative Neurologic: intact Heart: regular rate and rhythm, S1, S2 normal, no murmur, click, rub or gallop Lungs: clear to auscultation bilaterally Abdomen: soft, non-tender; bowel sounds normal; no masses,  no organomegaly Extremities: extremities normal, atraumatic, no cyanosis or edema  and Homans sign is negative, no sign of DVT  Lab Results: CBC: Recent Labs    08/17/18 1925 08/19/18 0708  WBC 13.8* 11.1*  HGB 15.5 11.3*  HCT 46.7 35.7*  PLT 408* 267   BMET:  Recent Labs    08/17/18 1925 08/18/18 0643  NA 137 136  K 3.6 3.8  CL 98 100  CO2 20* 22  GLUCOSE 280* 266*  BUN 8 7  CREATININE 1.20 1.02  CALCIUM 8.8* 8.4*    PT/INR:  Recent Labs    08/19/18 0422  LABPROT 13.9  INR 1.08     Radiology Dg Chest 2 View  Result Date: 08/17/2018 CLINICAL DATA:  Shortness of breath. Chest pain. Left lower extremity pain. EXAM: CHEST - 2 VIEW COMPARISON:  Two-view chest x-ray 05/29/2018 FINDINGS: Heart size is normal. Patchy airspace opacities are present in the perihilar regions bilaterally. Mild bibasilar atelectasis is noted. The visualized soft tissues and bony thorax are unremarkable. IMPRESSION: 1. Patchy airspace disease in the right greater than left upper lobe concerning for early infection. Electronically Signed   By: Marin Roberts M.D.   On: 08/17/2018 20:43   Ct Angio Chest Pe W And/or Wo Contrast  Result Date: 08/18/2018 CLINICAL DATA:  Acute onset substernal chest pain, shortness of breath. RIGHT  leg numbness tonight. History of pulmonary embolism, DVT, off anticoagulation. EXAM: CT ANGIOGRAPHY CHEST WITH CONTRAST TECHNIQUE: Multidetector CT imaging of the chest was performed using the standard protocol during bolus administration of intravenous contrast. Multiplanar CT image reconstructions and MIPs were obtained to evaluate the vascular anatomy. CONTRAST:  100mL ISOVUE-370 IOPAMIDOL (ISOVUE-370) INJECTION 76% COMPARISON:  Chest radiograph August 17, 2018 and CT angiogram chest May 10, 2018. FINDINGS: CARDIOVASCULAR: Adequate contrast opacification of the pulmonary artery's. Main pulmonary artery is not enlarged. Subcentimeter linear filling defect RIGHT upper lobar pulmonary artery. Heart size is mildly enlarged. Large LEFT ventricle  filling defect. No pericardial effusion. Thoracic aorta is normal course and caliber, unremarkable. Pulmonary venous congestion. MEDIASTINUM/NODES: No lymphadenopathy by CT size criteria. LUNGS/PLEURA: Tracheobronchial tree is patent, no pneumothorax. Trace bronchial wall thickening seen with bronchitis, reactive airway or pulmonary edema. Scattered tree-in-bud infiltrates/ground-glass nodules with upper lobe predominance. No pleural effusion. UPPER ABDOMEN: Non-acute. MUSCULOSKELETAL: Non-acute. Mild degenerative changes lower thoracic spine. Review of the MIP images confirms the above findings. IMPRESSION: 1. Small nonocclusive RIGHT upper lobar pulmonary embolism. No RIGHT heart strain. 2. 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 3. Tree-in-bud infiltrates/ground-glass nodules are likely infectious. 4. Mild cardiomegaly and pulmonary vascular congestion. 5. Critical Value/emergent results were called by telephone at the time of interpretation on 08/18/2018 at 1:53 am to Dr. Elesa MassedWard, who verbally acknowledged these results. Electronically Signed   By: Awilda Metroourtnay  Bloomer M.D.   On: 08/18/2018 01:54   Vas Koreas Lower Extremity Venous (dvt)  Result Date: 08/19/2018  Lower Venous Study Indications: Pain, and Large mobile thrombus in LV.  Performing Technologist: Sherren Kernsandace Kanady RVS  Examination Guidelines: A complete evaluation includes B-mode imaging, spectral Doppler, color Doppler, and power Doppler as needed of all accessible portions of each vessel. Bilateral testing is considered an integral part of a complete examination. Limited examinations for reoccurring indications may be performed as noted.  Right Venous Findings: +---------+---------------+---------+-----------+----------+-------+          CompressibilityPhasicitySpontaneityPropertiesSummary +---------+---------------+---------+-----------+----------+-------+ CFV      Full           Yes      Yes                           +---------+---------------+---------+-----------+----------+-------+ SFJ      Full                                                 +---------+---------------+---------+-----------+----------+-------+ FV Prox  Full                                                 +---------+---------------+---------+-----------+----------+-------+ FV Mid   Full                                                 +---------+---------------+---------+-----------+----------+-------+ FV DistalFull                                                 +---------+---------------+---------+-----------+----------+-------+  PFV      Full                                                 +---------+---------------+---------+-----------+----------+-------+ POP      Full           Yes      Yes                          +---------+---------------+---------+-----------+----------+-------+ PTV      Full                                                 +---------+---------------+---------+-----------+----------+-------+ PERO     Full                                                 +---------+---------------+---------+-----------+----------+-------+ GSV      Full                                                 +---------+---------------+---------+-----------+----------+-------+  Left Venous Findings: +---+---------------+---------+-----------+----------+-------+    CompressibilityPhasicitySpontaneityPropertiesSummary +---+---------------+---------+-----------+----------+-------+ CFVFull           Yes      Yes                          +---+---------------+---------+-----------+----------+-------+    Summary: Right: There is no evidence of deep vein thrombosis in the lower extremity. There is no evidence of superficial venous thrombosis. Incidentally, there is no arterial flow noted in the mid to distal femoral artery, popliteal, posterior tibial, anterior tibial, peroneal, and dorsalis pedis  arteries. Left: No evidence of common femoral vein obstruction.  *See table(s) above for measurements and observations. Electronically signed by Sherald Hesshristopher Clark MD on 08/19/2018 at 10:39:06 AM.    Final      Assessment/Plan: S/P Procedure(s) (LRB): RIGHT LOWER EXTREMITY THROMBECTOMY (Right)  To get cardiac mri tomorrow Feet warm, palpable pulses bilateral  Feet Will review then decide about operative treatment  Delight OvensEdward B Nikyah Lackman MD      [address] [place] [id number] Office [phone]   Beeper [phone]    Delight OvensEdward B Erinn Mendosa MD 08/19/2018 12:37 PM

## 2018-08-19 NOTE — Progress Notes (Signed)
PROGRESS NOTE    Marcus Joseph.  ZOX:[id number] DOB: [birth year] DOA: 08/17/2018 PCP: [facility]   Brief Narrative: Patient is a 47 year old male with past medical history of insulin-dependent diabetes mellitus, right leg DVT, recurrent PE we stopped anticoagulation prematurely due to financial condition who presented to the emergency department yesterday night with complaints of acute onset of substernal chest pain, shortness of breath and right lower extremity numbness.  Patient was found to have small non-occlusive right upper lobe PE without right heart strain but also large left liver lesion ventricular thrombus.  Patient started on heparin drip.  Cardiology consulted.  Given the size of left ventricular thrombus we also consulted cardiothoracic surgery who recommended to do TEE. We was found to have decreased  arterial flow on his left lower extremity very concerning for right lower extremity thromboembolism.  There was no flow in SFA/popliteal/tibials on duplex. Consulted vascular surgery and he is underwent emergent vascular intervention/thrombectomy on 08/18/18.  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  No chest pain, troponins negative.  Cardiology, cardiothoracic surgery following.  Continue heparin drip.  Plan is to do cardiac MRI . He will eventually need to be on Coumadin.  We will follow-up with cardiology.  Might need further hypercoagulable workup.  Right lower extremity thromboembolism: Presented with right lower extremity numbness and tingling.  Found to have no flow in  SFA/popliteal/tibials on duplex.   Underwent emergent thromboembolectomy by vascular surgery.  Continue heparin drip.  He has a drain on his right thigh.  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  He was on Xarelto but he stopped taking that due to financial conditions.  Presented at this time with shortness of breath and chest pain.  Found to have small nonocclusive PE involving right upper lobe without CT evidence of heart strain.  Currently on heparin drip.  Due to presence of left ventricular thrombus, he might need to be put on Coumadin.  Currently hemodynamically stable.  Denies any chest pain.  Insulin-dependent diabetes mellitus: Hemoglobin was 11.2% in September 2019.  On Lantus and NovoLog at home.  Continue monitoring his blood sugars.  Continue Lantus and sliding his insulin.  Possible community-acquired pneumonia: Reports recent sore throat and rhinorrhea, bloody cough.  No fever or chills.  CT findings history of atypical pneumonia.  Antibiotics changed to oral today.  Respiratory status stable.  Morbid obesity: Emphasized on the importance of balanced diet and exercise when he is discharged from the hospital.         DVT prophylaxis: Heparin Code Status: Full Family Communication: Family member present at the bedside Disposition Plan: Home after full work-up   Consultants: Cardiology, cardiothoracic surgery, vascular surgery  Procedures: Thromboembolectomy  Antimicrobials: Levaquin  Subjective: Patient seen and examined the bedside this morning.  Remains comfortable.  Right lower extremity numbness and tingling has improved.  Respiratory status stable.  Denies any chest pain or shortness of breath.  Objective: Vitals:   08/18/18 2008 08/18/18 2312 08/19/18 0433 08/19/18 0800  BP: 124/81 90/60 (!) 98/55 101/60  Pulse: (!) 56 88 79   Resp: Marland Kitchen)  23 20 19    Temp: 98.2 F (36.8 C) 98.2 F (36.8 C) 98.8 F (37.1 C)  98.5 F (36.9 C)  TempSrc: Oral Oral Oral Oral  SpO2: 98% 97% 95% 97%    Intake/Output Summary (Last 24 hours) at 08/19/2018 0956 Last data filed at 08/19/2018 [id number] Gross per 24 hour  Intake 3804.78 ml  Output 3653 ml  Net 151.78 ml   There were no vitals filed for this visit.  Examination:  General exam: Appears calm and comfortable ,Not in distress,obese HEENT:PERRL,Oral mucosa moist, Ear/Nose normal on gross exam Respiratory system: Bilateral equal air entry, normal vesicular breath sounds, no wheezes or crackles  Cardiovascular system: S1 & S2 heard, RRR. No JVD, murmurs, rubs, gallops or clicks. No pedal edema. Gastrointestinal system: Abdomen is nondistended, soft and nontender. No organomegaly or masses felt. Normal bowel sounds heard. Central nervous system: Alert and oriented. No focal neurological deficits. Extremities: No edema, no clubbing ,no cyanosis, distal peripheral pulses palpable. Skin: No rashes, lesions or ulcers,no icterus ,no pallor MSK: Normal muscle bulk,tone ,power Psychiatry: Judgement and insight appear normal. Mood & affect appropriate.     Data Reviewed: I have personally reviewed following labs and imaging studies  CBC: Recent Labs  Lab 08/17/18 1925 08/19/18 0708  WBC 13.8* 11.1*  NEUTROABS  --  8.3*  HGB 15.5 11.3*  HCT 46.7 35.7*  MCV 85.1 86.4  PLT 408* 267   Basic Metabolic Panel: Recent Labs  Lab 08/17/18 1925 08/18/18 0643  NA 137 136  K 3.6 3.8  CL 98 100  CO2 20* 22  GLUCOSE 280* 266*  BUN 8 7  CREATININE 1.20 1.02  CALCIUM 8.8* 8.4*   GFR: CrCl cannot be calculated (Unknown ideal weight.). Liver Function Tests: No results for input(s): AST, ALT, ALKPHOS, BILITOT, PROT, ALBUMIN in the last 168 hours. No results for input(s): LIPASE, AMYLASE in the last 168 hours. No results for input(s): AMMONIA in the last 168 hours. Coagulation Profile: Recent Labs  Lab 08/19/18 0422  INR 1.08   Cardiac Enzymes: Recent  Labs  Lab 08/18/18 1808  TROPONINI 0.04*   BNP (last 3 results) No results for input(s): PROBNP in the last 8760 hours. HbA1C: No results for input(s): HGBA1C in the last 72 hours. CBG: Recent Labs  Lab 08/18/18 1841 08/18/18 2004 08/18/18 2358 08/19/18 0400 08/19/18 0748  GLUCAP 236* 257* 334* 202* 201*   Lipid Profile: No results for input(s): CHOL, HDL, LDLCALC, TRIG, CHOLHDL, LDLDIRECT in the last 72 hours. Thyroid Function Tests: No results for input(s): TSH, T4TOTAL, FREET4, T3FREE, THYROIDAB in the last 72 hours. Anemia Panel: No results for input(s): VITAMINB12, FOLATE, FERRITIN, TIBC, IRON, RETICCTPCT in the last 72 hours. Sepsis Labs: No results for input(s): PROCALCITON, LATICACIDVEN in the last 168 hours.  Recent Results (from the past 240 hour(s))  Culture, sputum-assessment     Status: None   Collection Time: 08/18/18 10:19 PM  Result Value Ref Range Status   Specimen Description EXPECTORATED SPUTUM  Final   Special Requests Normal  Final   Sputum evaluation   Final    THIS SPECIMEN IS ACCEPTABLE FOR SPUTUM CULTURE Performed at [facility], [address] [place], [place] [address]    Report Status 08/19/2018 FINAL  Final  Culture, respiratory     Status: None (Preliminary result)   Collection Time: 08/18/18 10:19 PM  Result Value Ref Range Status   Specimen Description EXPECTORATED SPUTUM  Final   Special Requests Normal Reflexed from  [id number]  Final   Gram Stain   Final    RARE WBC PRESENT, PREDOMINANTLY PMN RARE GRAM POSITIVE RODS Performed at [facility], [address] [place], [place] [address]    Culture PENDING  Incomplete   Report Status PENDING  Incomplete         Radiology Studies: Dg Chest 2 View  Result Date: 08/17/2018 CLINICAL DATA:  Shortness of breath. Chest pain. Left lower extremity pain. EXAM: CHEST - 2 VIEW COMPARISON:  Two-view chest x-ray 05/29/2018 FINDINGS: Heart size is normal. Patchy airspace opacities  are present in the perihilar regions bilaterally. Mild bibasilar atelectasis is noted. The visualized soft tissues and bony thorax are unremarkable. IMPRESSION: 1. Patchy airspace disease in the right greater than left upper lobe concerning for early infection. Electronically Signed   By: Marin Robertshristopher  Mattern M.D.   On: 08/17/2018 20:43   Ct Angio Chest Pe W And/or Wo Contrast  Result Date: 08/18/2018 CLINICAL DATA:  Acute onset substernal chest pain, shortness of breath. RIGHT leg numbness tonight. History of pulmonary embolism, DVT, off anticoagulation. EXAM: CT ANGIOGRAPHY CHEST WITH CONTRAST TECHNIQUE: Multidetector CT imaging of the chest was performed using the standard protocol during bolus administration of intravenous contrast. Multiplanar CT image reconstructions and MIPs were obtained to evaluate the vascular anatomy. CONTRAST:  100mL ISOVUE-370 IOPAMIDOL (ISOVUE-370) INJECTION 76% COMPARISON:  Chest radiograph August 17, 2018 and CT angiogram chest May 10, 2018. FINDINGS: CARDIOVASCULAR: Adequate contrast opacification of the pulmonary artery's. Main pulmonary artery is not enlarged. Subcentimeter linear filling defect RIGHT upper lobar pulmonary artery. Heart size is mildly enlarged. Large LEFT ventricle filling defect. No pericardial effusion. Thoracic aorta is normal course and caliber, unremarkable. Pulmonary venous congestion. MEDIASTINUM/NODES: No lymphadenopathy by CT size criteria. LUNGS/PLEURA: Tracheobronchial tree is patent, no pneumothorax. Trace bronchial wall thickening seen with bronchitis, reactive airway or pulmonary edema. Scattered tree-in-bud infiltrates/ground-glass nodules with upper lobe predominance. No pleural effusion. UPPER ABDOMEN: Non-acute. MUSCULOSKELETAL: Non-acute. Mild degenerative changes lower thoracic spine. Review of the MIP images confirms the above findings. IMPRESSION: 1. Small nonocclusive RIGHT upper lobar pulmonary embolism. No RIGHT heart strain.  2. 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 3. Tree-in-bud infiltrates/ground-glass nodules are likely infectious. 4. Mild cardiomegaly and pulmonary vascular congestion. 5. Critical Value/emergent results were called by telephone at the time of interpretation on 08/18/2018 at 1:53 am to Dr. Elesa MassedWard, who verbally acknowledged these results. Electronically Signed   By: Awilda Metroourtnay  Bloomer M.D.   On: 08/18/2018 01:54   Vas [place]as Lower Extremity Venous (dvt)  Result Date: 08/18/2018  Lower Venous Study Indications: Pain, and Large mobile thrombus in LV.  Performing Technologist: Sherren Kernsandace Kanady RVS  Examination Guidelines: A complete evaluation includes B-mode imaging, spectral Doppler, color Doppler, and power Doppler as needed of all accessible portions of each vessel. Bilateral testing is considered an integral part of a complete examination. Limited examinations for reoccurring indications may be performed as noted.  Right Venous Findings: +---------+---------------+---------+-----------+----------+-------+          CompressibilityPhasicitySpontaneityPropertiesSummary +---------+---------------+---------+-----------+----------+-------+ CFV      Full           Yes      Yes                          +---------+---------------+---------+-----------+----------+-------+ SFJ      Full                                                 +---------+---------------+---------+-----------+----------+-------+  FV Prox  Full                                                 +---------+---------------+---------+-----------+----------+-------+ FV Mid   Full                                                 +---------+---------------+---------+-----------+----------+-------+ FV DistalFull                                                 +---------+---------------+---------+-----------+----------+-------+ PFV      Full                                                  +---------+---------------+---------+-----------+----------+-------+ POP      Full           Yes      Yes                          +---------+---------------+---------+-----------+----------+-------+ PTV      Full                                                 +---------+---------------+---------+-----------+----------+-------+ PERO     Full                                                 +---------+---------------+---------+-----------+----------+-------+ GSV      Full                                                 +---------+---------------+---------+-----------+----------+-------+  Left Venous Findings: +---+---------------+---------+-----------+----------+-------+    CompressibilityPhasicitySpontaneityPropertiesSummary +---+---------------+---------+-----------+----------+-------+ CFVFull           Yes      Yes                          +---+---------------+---------+-----------+----------+-------+    Summary: Right: There is no evidence of deep vein thrombosis in the lower extremity. There is no evidence of superficial venous thrombosis. Incidentally, there is no arterial flow noted in the mid to distal femoral artery, popliteal, posterior tibial, anterior tibial, peroneal, and dorsalis pedis arteries. Left: No evidence of common femoral vein obstruction.  *See table(s) above for measurements and observations.    Preliminary         Scheduled Meds: . insulin aspart  0-9 Units Subcutaneous Q4H  . insulin glargine  15 Units Subcutaneous QHS  . levofloxacin  750 mg Oral Daily  . sodium chloride flush  3 mL Intravenous Q12H  Continuous Infusions: . heparin 2,250 Units/hr (08/19/18 0822)     LOS: 0 days    Time spent: 35 mins.More than 50% of that time was spent in counseling and/or coordination of care.      Burnadette Pop, MD [facility] Pager [phone]  If 7PM-7AM, please contact night-coverage [email or web address] Password  [facility] 08/19/2018, 9:56 AM

## 2018-08-19 NOTE — Progress Notes (Signed)
ANTICOAGULATION CONSULT NOTE  Pharmacy Consult for Heparin  Indication: pulmonary embolus and DVT  Allergies  Allergen Reactions  . Contrast Media [Iodinated Diagnostic Agents] Itching    Pt states he thinks it makes him itch  . Lovenox [Enoxaparin Sodium] Itching    Itching over entire body  . Percocet [Oxycodone-Acetaminophen] Hives and Itching    Patient Measurements: ~135 kg  Vital Signs: Temp: 98.8 F (37.1 C) (12/22 0433) Temp Source: Oral (12/22 0433) BP: 98/55 (12/22 0433) Pulse Rate: 79 (12/22 0433)  Labs: Recent Labs    08/17/18 1925 08/18/18 0643 08/18/18 0930 08/18/18 1808 08/18/18 2020 08/19/18 0422  HGB 15.5  --   --   --   --   --   HCT 46.7  --   --   --   --   --   PLT 408*  --   --   --   --   --   LABPROT  --   --   --   --   --  13.9  INR  --   --   --   --   --  1.08  HEPARINUNFRC  --   --  0.11*  --  0.46 0.15*  CREATININE 1.20 1.02  --   --   --   --   TROPONINI  --   --   --  0.04*  --   --     CrCl cannot be calculated (Unknown ideal weight.).  Assessment: 47 y/o M with recent history of DVT and PE, he had been on Xarelto until last month, unable to obtain due to financial reasons, now presents to the ED with chest pain, CT angio with PE and LV thrombus. Now s/p embolectomy   12/22 AM: heparin level subtherapeutic at 0.15 on heparin infusion at 2050 units/hr. Per RN no bleeding or infusion issues noted overnight and heparin infusion has been running.  Will not bolus with recent procedure.   Goal of Therapy:  Heparin level 0.3-0.7 units/ml Monitor platelets by anticoagulation protocol: Yes   Plan:  Increase heparin to 2250 units/hr Heparin level at 1400  Daily CBC/HL Monitor for bleeding  Thank you for involving pharmacy in this patient's care.  Wendelyn Breslowylan Hanna, PharmD PGY1 Pharmacy Resident Phone: [phone] 08/19/2018 7:33 AM

## 2018-08-19 NOTE — Plan of Care (Signed)
Care plans reviewed and patient is progressing.  

## 2018-08-20 ENCOUNTER — Encounter (HOSPITAL_COMMUNITY): Payer: Self-pay | Admitting: Vascular Surgery

## 2018-08-20 ENCOUNTER — Inpatient Hospital Stay (HOSPITAL_COMMUNITY): Payer: Self-pay

## 2018-08-20 DIAGNOSIS — I513 Intracardiac thrombosis, not elsewhere classified: Secondary | ICD-10-CM

## 2018-08-20 LAB — CBC WITH DIFFERENTIAL/PLATELET
Abs Immature Granulocytes: 0.1 10*3/uL — ABNORMAL HIGH (ref 0.00–0.07)
Basophils Absolute: 0 10*3/uL (ref 0.0–0.1)
Basophils Relative: 0 %
EOS ABS: 0.1 10*3/uL (ref 0.0–0.5)
Eosinophils Relative: 1 %
HCT: 35.3 % — ABNORMAL LOW (ref 39.0–52.0)
Hemoglobin: 11.8 g/dL — ABNORMAL LOW (ref 13.0–17.0)
Immature Granulocytes: 1 %
Lymphocytes Relative: 31 %
Lymphs Abs: 3.5 10*3/uL (ref 0.7–4.0)
MCH: 28.2 pg (ref 26.0–34.0)
MCHC: 33.4 g/dL (ref 30.0–36.0)
MCV: 84.2 fL (ref 80.0–100.0)
Monocytes Absolute: 0.9 10*3/uL (ref 0.1–1.0)
Monocytes Relative: 8 %
Neutro Abs: 6.6 10*3/uL (ref 1.7–7.7)
Neutrophils Relative %: 59 %
Platelets: 253 10*3/uL (ref 150–400)
RBC: 4.19 MIL/uL — ABNORMAL LOW (ref 4.22–5.81)
RDW: 13.5 % (ref 11.5–15.5)
WBC: 11.1 10*3/uL — ABNORMAL HIGH (ref 4.0–10.5)
nRBC: 0 % (ref 0.0–0.2)

## 2018-08-20 LAB — GLUCOSE, CAPILLARY
Glucose-Capillary: 176 mg/dL — ABNORMAL HIGH (ref 70–99)
Glucose-Capillary: 205 mg/dL — ABNORMAL HIGH (ref 70–99)
Glucose-Capillary: 209 mg/dL — ABNORMAL HIGH (ref 70–99)
Glucose-Capillary: 212 mg/dL — ABNORMAL HIGH (ref 70–99)
Glucose-Capillary: 212 mg/dL — ABNORMAL HIGH (ref 70–99)
Glucose-Capillary: 229 mg/dL — ABNORMAL HIGH (ref 70–99)

## 2018-08-20 LAB — PROTIME-INR
INR: 1.07
Prothrombin Time: 13.8 seconds (ref 11.4–15.2)

## 2018-08-20 LAB — HEPARIN LEVEL (UNFRACTIONATED): Heparin Unfractionated: 0.44 IU/mL (ref 0.30–0.70)

## 2018-08-20 LAB — BASIC METABOLIC PANEL
Anion gap: 10 (ref 5–15)
BUN: 6 mg/dL (ref 6–20)
CO2: 26 mmol/L (ref 22–32)
Calcium: 8.1 mg/dL — ABNORMAL LOW (ref 8.9–10.3)
Chloride: 101 mmol/L (ref 98–111)
Creatinine, Ser: 1.02 mg/dL (ref 0.61–1.24)
GFR calc Af Amer: >60 mL/min (ref >60)
GFR calc non Af Amer: >60 mL/min (ref >60)
Glucose, Bld: 181 mg/dL — ABNORMAL HIGH (ref 70–99)
Potassium: 3.4 mmol/L — ABNORMAL LOW (ref 3.5–5.1)
Sodium: 137 mmol/L (ref 135–145)

## 2018-08-20 MED ORDER — DIAZEPAM 5 MG PO TABS
5.0000 mg | ORAL_TABLET | Freq: Once | ORAL | Status: AC
  Administered 2018-08-20: 5 mg via ORAL
  Filled 2018-08-20: qty 1

## 2018-08-20 MED ORDER — GADOBUTROL 1 MMOL/ML IV SOLN
10.0000 mL | Freq: Once | INTRAVENOUS | Status: AC | PRN
  Administered 2018-08-20: 10 mL via INTRAVENOUS

## 2018-08-20 MED ORDER — SODIUM CHLORIDE 0.9 % IV SOLN
INTRAVENOUS | Status: DC
  Administered 2018-08-21: 06:00:00 via INTRAVENOUS

## 2018-08-20 MED ORDER — SODIUM CHLORIDE 0.9% FLUSH: 3.0000 mL | Freq: Two times a day (BID) | INTRAVENOUS | Status: DC

## 2018-08-20 MED ORDER — SODIUM CHLORIDE 0.9 % IV SOLN: 250.0000 mL | INTRAVENOUS | Status: DC | PRN

## 2018-08-20 MED ORDER — POTASSIUM CHLORIDE CRYS ER 20 MEQ PO TBCR
40.0000 meq | EXTENDED_RELEASE_TABLET | Freq: Once | ORAL | Status: AC
  Administered 2018-08-20: 40 meq via ORAL
  Filled 2018-08-20: qty 2

## 2018-08-20 MED ORDER — SODIUM CHLORIDE 0.9% FLUSH: 3.0000 mL | INTRAVENOUS | Status: DC | PRN

## 2018-08-20 MED ORDER — DIPHENHYDRAMINE HCL 50 MG/ML IJ SOLN
25.0000 mg | Freq: Four times a day (QID) | INTRAMUSCULAR | Status: DC | PRN
  Administered 2018-08-20: 25 mg via INTRAVENOUS
  Filled 2018-08-20 (×2): qty 1

## 2018-08-20 MED ORDER — OXYCODONE-ACETAMINOPHEN 5-325 MG PO TABS: 2.0000 | ORAL_TABLET | ORAL | Status: DC | PRN

## 2018-08-20 NOTE — Progress Notes (Signed)
Inpatient Diabetes Program Recommendations  AACE/ADA: New Consensus Statement on Inpatient Glycemic Control (2015)  Target Ranges:  Prepandial:   less than 140 mg/dL      Peak postprandial:   less than 180 mg/dL (1-2 hours)      Critically ill patients:  140 - 180 mg/dL   Lab Results  Component Value Date   GLUCAP 212 (H) 08/20/2018   HGBA1C 11.2 (H) 05/10/2018    Review of Glycemic Control Results for Marcus SpillerSPRINGS, [name] (MRN [id number]) as of 08/20/2018 10:10  Ref. Range 08/19/2018 16:38 08/19/2018 19:55 08/20/2018 00:13 08/20/2018 04:16 08/20/2018 08:15  Glucose-Capillary Latest Ref Range: 70 - 99 mg/dL 782197 (H) 956263 (H) 213212 (H) 176 (H) 212 (H)   Diabetes history: DM2 Outpatient Diabetes medications: Lantus 30 units + Novolog 6 units tid meal coverage Current orders for Inpatient glycemic control: Lantus 15 units + Novolog sensitive correction q 4 hrs.  Inpatient Diabetes Program Recommendations:   -Add Novolog 3 units tid meal coverage if eats 50% -Increase Lantus to 20 units daily  Thank you, Billy FischerJudy E. Terrie Grajales, RN, MSN, CDE  Diabetes Coordinator Inpatient Glycemic Control Team Team Pager [phone] (8am-5pm) 08/20/2018 10:14 AM

## 2018-08-20 NOTE — H&P (View-Only) (Signed)
At cMRI today- will review later today. Suspect findings will be more consistent with thrombus, however, need to rule-out intracardiac shunt as well. Will follow-up with the patient in am tomorrow. Continue IV Heparin. Basic hypercoagulable work-up appears negative thus far.  Marcus Dorval C. Bekim Werntz, MD, FACC, FACP  [place]  [facility]  Medical Director of the Advanced Lipid Disorders &  Cardiovascular Risk Reduction Clinic Diplomate of the American Board of Clinical Lipidology Attending Cardiologist  Direct Dial: [phone]  Fax: [phone]  Website:  [email or web address]  

## 2018-08-20 NOTE — Progress Notes (Signed)
Patient request medication to help relax him for MRI. Notified MRI that patient had been given valium per MD order for procedure.

## 2018-08-20 NOTE — Care Management Note (Signed)
Case Management Note Marvetta Gibbons RN, BSN Transitions of Care Unit 4E- RN Case Manager [phone]  Patient Details  Name: Marcus Joseph. MRN: [id number] Date of Birth: 25-Jun-1971  Subjective/Objective:  Pt admitted with left ventricular thrombus                  Action/Plan: PTA pt lived at home, independent. Referral received for medication needs- pt was on Xarelto but unable to afford- benefits check completed and showed pt has no drug coverage or insurance benefits- CM met with pt at bedside and confirmed pt lost insurance in November which was at the time he no longer could afford his Xarelto. Pt reports he has already used 30 day free card in past so would be ineligible to use that again. PCP given as [facility].- pt also on insulins Novolog and lantus status he insulin at home currently. CM will follow for oral anti-coag plan- pt may need assistance with medications on discharge pending on oral choice. Would be eligible for MATCH if needed.   Expected Discharge Date:                  Expected Discharge Plan:     In-House Referral:  Clinical Social Work  Discharge planning Services  CM Consult, Medication Assistance  Post Acute Care Choice:    Choice offered to:     DME Arranged:    DME Agency:     HH Arranged:    [place] Agency:     Status of Service:  In process, will continue to follow  If discussed at Long Length of Stay Meetings, dates discussed:    Discharge Disposition:   Additional Comments:  Dawayne Patricia, RN 08/20/2018, 4:10 PM

## 2018-08-20 NOTE — Progress Notes (Signed)
ANTICOAGULATION CONSULT NOTE  Pharmacy Consult for Heparin  Indication: pulmonary embolus and DVT, LV thrombus  Allergies  Allergen Reactions  . Contrast Media [Iodinated Diagnostic Agents] Itching    Pt states he thinks it makes him itch  . Lovenox [Enoxaparin Sodium] Itching    Itching over entire body  . Percocet [Oxycodone-Acetaminophen] Hives and Itching    Patient Measurements: ~135 kg  Vital Signs: Temp: 100.5 F (38.1 C) (12/23 0830) Temp Source: Oral (12/23 0830) BP: 141/69 (12/23 0830) Pulse Rate: 96 (12/23 0830)  Labs: Recent Labs    08/17/18 1925 08/18/18 0643  08/18/18 1808  08/19/18 0422 08/19/18 0708 08/19/18 1343 08/20/18 0345  HGB 15.5  --   --   --   --   --  11.3*  --  11.8*  HCT 46.7  --   --   --   --   --  35.7*  --  35.3*  PLT 408*  --   --   --   --   --  267  --  253  LABPROT  --   --   --   --   --  13.9  --   --  13.8  INR  --   --   --   --   --  1.08  --   --  1.07  HEPARINUNFRC  --   --    < >  --    < > 0.15*  --  0.43 0.44  CREATININE 1.20 1.02  --   --   --   --   --   --  1.02  TROPONINI  --   --   --  0.04*  --   --   --   --   --    < > = values in this interval not displayed.    CrCl cannot be calculated (Unknown ideal weight.).  Assessment: 47 y/o M with recent history of DVT and PE, he had been on Xarelto until last month, unable to obtain due to financial reasons, now presents to the ED with chest pain, CT angio with PE and LV thrombus. Now s/p embolectomy   Heparin level therapeutic  Goal of Therapy:  Heparin level 0.3-0.7 units/ml Monitor platelets by anticoagulation protocol: Yes   Plan:  Continueheparin at2250 units/hr Daily CBC/HL Monitor for bleeding F/u hypercoagulable w/u and longterm AC plans  Thank you for involving pharmacy in this patient's care. Okey RegalLisa Otis Burress, PharmD [phone]  **Pharmacist phone directory can now be found on [email or web address] ([email or web address]).  Listed under [facility]. 08/20/2018 8:43 AM

## 2018-08-20 NOTE — Progress Notes (Signed)
[address]       [place] [id number]             [phone]                 2 Days Post-Op Procedure(s) (LRB): RIGHT LOWER EXTREMITY THROMBECTOMY (Right)  LOS: 1 day   Subjective: Patient without complaints today , still at bed rest from leg surgery  Objective: Vital signs in last 24 hours: Patient Vitals for the past 24 hrs:  BP Temp Temp src Pulse Resp SpO2  08/20/18 1208 120/73 98.2 F (36.8 C) Oral 90 (!) 21 97 %  08/20/18 0830 (!) 141/69 (!) 100.5 F (38.1 C) Oral 96 (!) 22 96 %  08/20/18 0415 138/79 99.4 F (37.4 C) Oral 88 15 97 %  08/19/18 2325 126/66 98.2 F (36.8 C) Oral 88 18 97 %  08/19/18 1948 119/61 98.7 F (37.1 C) Oral 88 (!) 25 98 %    There were no vitals filed for this visit.  Hemodynamic parameters for last 24 hours:    Intake/Output from previous day: 12/22 0701 - 12/23 0700 In: 1867.4 [P.O.:1320; I.V.:547.4] Out: 5571 [Urine:5550; Drains:20; Stool:1] Intake/Output this shift: Total I/O In: -  Out: 700 [Urine:700]  Scheduled Meds: . insulin aspart  0-9 Units Subcutaneous Q4H  . insulin glargine  15 Units Subcutaneous QHS  . levofloxacin  750 mg Oral Daily  . sodium chloride flush  3 mL Intravenous Q12H   Continuous Infusions: . heparin 2,250 Units/hr (08/20/18 1433)   PRN Meds:.acetaminophen **OR** acetaminophen, alum & mag hydroxide-simeth, diphenhydrAMINE, fentaNYL (SUBLIMAZE) injection, ondansetron **OR** ondansetron (ZOFRAN) IV, senna-docusate  General appearance: alert and cooperative Neurologic: intact Heart: regular rate and rhythm, S1, S2 normal, no murmur, click, rub or gallop Lungs: clear to auscultation bilaterally Abdomen: soft, non-tender; bowel sounds normal; no masses,  no organomegaly Extremities: feet warm , palpable pulses    Lab Results: CBC: Recent Labs    08/19/18 0708 08/20/18 0345  WBC 11.1* 11.1*  HGB 11.3* 11.8*  HCT 35.7* 35.3*  PLT 267 253   BMET:  Recent Labs   08/18/18 0643 08/20/18 0345  NA 136 137  K 3.8 3.4*  CL 100 101  CO2 22 26  GLUCOSE 266* 181*  BUN 7 6  CREATININE 1.02 1.02  CALCIUM 8.4* 8.1*    PT/INR:  Recent Labs    08/20/18 0345  LABPROT 13.8  INR 1.07     Radiology Mr Cardiac Morphology W Wo Contrast  Result Date: 08/20/2018 CLINICAL DATA:  LV Mass EXAM: CARDIAC MRI TECHNIQUE: The patient was scanned on a 1.5 Tesla Siemens magnet. A dedicated cardiac coil was used. Functional imaging was done using Fiesta sequences. 2,3, and 4 chamber views were done to assess for RWMA's. Modified Simpson's rule using a short axis stack was used to calculate an ejection fraction on a dedicated work Research officer, trade unionstation using Circle software. The patient received 10 cc of Gadavist . After 10 minutes inversion recovery sequences were used to assess for infiltration and scar tissue. CONTRAST:  Gavavist FINDINGS: Study was limited due to patients body habitus and claustrophobia There was mild LAE. The RA/RV were normal in size and function There was no obvious PFO/ASD. There was no effusion Aortic root was normal. The LV was moderately dilated The distal anterior wall, apex and septum were hypokinetic. The quantitative EF was 38% (EDV 193 cc, ESV 119 cc SV 74 cc). Delayed enhancement images suboptimal but showed  some scar in the distal anterior wall, septum and apex. However post gadolinium long TI images were diagnostic for large LV thrombus emanating from hypokinetic apex cephalad to the LVOt. Thrombus measures 6 cm x 1.6 cm and is highly mobile and of extremely high embolic potential IMPRESSION: 1.Moderate LVE with septal, apical and distal anterior wall hypokinesis EF 38% 2.Very large mobile thrombus 6 cm x 1.5 cm emanating form apex to LVOT High embolic potential 3.No obvious PFO/ASD study limited by patients body habitus and claustrophobia 4.Mild LAE Charlton HawsPeter Nishan Electronically Signed   By: Charlton HawsPeter  Nishan M.D.   On: 08/20/2018 14:33     Assessment/Plan: S/P  Procedure(s) (LRB): RIGHT LOWER EXTREMITY THROMBECTOMY (Right)  Patient feels well currently , just back from mri Concerned about decreased lv function and ? Change from 04/2018 Discussed with Dr Excell Seltzerooper re cath to evaluate coronary anatomy.  remains on heparin drip      Delight OvensEdward B Derral Colucci MD 08/20/2018 5:04 PM

## 2018-08-20 NOTE — Progress Notes (Signed)
[facility]  Subjective  -  Doing well this am.  Right foot feels much better.   Objective (!) 141/69 96 (!) 100.5 F (38.1 C) (Oral) (!) 22 96%  Intake/Output Summary (Last 24 hours) at 08/20/2018 1037 Last data filed at 08/20/2018 0953 Gross per 24 hour  Intake 1309.91 ml  Output 5821 ml  Net -4511.09 ml    Right DP palpable Right groin incision healing well JP drain in place OP sanguinous   Assessment/Planning: POD # 2 Procedure: 1.  Right iliac, femoral, popliteal, and tibial thromboembolectomy via right femoral cutdown 2. 19 Fr Blake drain placed in the right groin   Continues to do well POD#2 s/p RLE thrombectomy.  Palpable DP pulse.  Foot warm.  Motor intact and sensation improving.  Continue heparin gtt.  Calf soft and no fasciotomies performed. Will leave drain for now.  Cephus Shellinghristopher J. Kauri Garson, MD [facility] [place] Office: [phone] Pager: [phone]  Cephus Shellinghristopher J Caren Garske

## 2018-08-20 NOTE — Progress Notes (Signed)
Patient states that he has an allergy to contrast dye and it was given to him during recent MRI today. Patient itching and requests benadryl. No other symptoms. MD paged via Amion system.

## 2018-08-20 NOTE — Progress Notes (Addendum)
At cMRI today- will review later today. Suspect findings will be more consistent with thrombus, however, need to rule-out intracardiac shunt as well. Will follow-up with the patient in am tomorrow. Continue IV Heparin. Basic hypercoagulable work-up appears negative thus far.  Chrystie NoseKenneth C. Hilty, MD, [facility] MidtownFACC, FACP  [place]  [facility]  Medical Director of the Advanced Lipid Disorders &  Cardiovascular Risk Reduction Clinic Diplomate of the American Board of Clinical Lipidology Attending Cardiologist  Direct Dial: [phone]  Fax: [phone]  Website:  [email or web address]

## 2018-08-20 NOTE — Progress Notes (Signed)
[facility]  Subjective  - Doing well without new complaints   Objective 138/79 88 99.4 F (37.4 C) (Oral) 15 97%  Intake/Output Summary (Last 24 hours) at 08/20/2018 0748 Last data filed at 08/20/2018 0600 Gross per 24 hour  Intake 1867.4 ml  Output 5571 ml  Net -3703.6 ml    Palpable DP pulses B Right groin soft, dry 4 x 4 over incision to keep dry JP drain 20 cc OP. Heart RRR Lungs non labored breathing  Assessment/Planning: POD # 2 Procedure: 1. Right iliac,femoral, popliteal, and tibial thromboembolectomy via right femoral cutdown 2. 19 Fr Blake drain placed in the right groin  Continue anticoagulation with Heparin Pending further cardiac work up MRI of the heart ordered today by DR. Gerhardt. We will maintain the drain today  Marcus Pigeonmma Maureen Quintina Joseph 08/20/2018 7:48 AM --  Laboratory Lab Results: Recent Labs    08/19/18 0708 08/20/18 0345  WBC 11.1* 11.1*  HGB 11.3* 11.8*  HCT 35.7* 35.3*  PLT 267 253   BMET Recent Labs    08/18/18 0643 08/20/18 0345  NA 136 137  K 3.8 3.4*  CL 100 101  CO2 22 26  GLUCOSE 266* 181*  BUN 7 6  CREATININE 1.02 1.02  CALCIUM 8.4* 8.1*    COAG Lab Results  Component Value Date   INR 1.07 08/20/2018   INR 1.08 08/19/2018   INR 0.97 05/26/2014   No results found for: PTT

## 2018-08-20 NOTE — Progress Notes (Signed)
PROGRESS NOTE    Marcus Joseph.  ZOX:[id number] DOB: 27-Mar-1971 DOA: 08/17/2018 PCP: [facility]   Brief Narrative: Patient is a 47 year old male with past medical history of insulin-dependent diabetes mellitus, right leg DVT, recurrent PE we stopped anticoagulation prematurely due to financial condition who presented to the emergency department yesterday night with complaints of acute onset of substernal chest pain, shortness of breath and right lower extremity numbness.  Patient was found to have small non-occlusive right upper lobe PE without right heart strain but also large left liver lesion ventricular thrombus.  Patient started on heparin drip.  Cardiology consulted.  Given the size of left ventricular thrombus we also consulted cardiothoracic surgery who recommended to do TEE. He was found to have decreased  arterial flow on his left lower extremity very concerning for right lower extremity thromboembolism.  There was no flow in SFA/popliteal/tibials on duplex. Consulted vascular surgery and he is underwent emergent vascular intervention/thrombectomy on 08/18/18.Plan for cardiac MRI today.  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  No chest pain, troponins negative.  Cardiology, cardiothoracic surgery following.  Continue heparin drip.  Plan is to do cardiac MRI . He will eventually need to be on Coumadin.  We will follow-up with cardiology.  Follow up hypercoagulable workup.  Right lower extremity thromboembolism: Presented with right lower extremity numbness and tingling.  Found to  have no flow in SFA/popliteal/tibials on duplex.   Underwent emergent thromboembolectomy by vascular surgery.  Continue heparin drip.  He has a drain on his right thigh.  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  He was on Xarelto but he stopped taking that due to financial conditions.  Presented at this time with shortness of breath and chest pain.  Found to have small nonocclusive PE involving right upper lobe without CT evidence of heart strain.  Currently on heparin drip.  Due to presence of left ventricular thrombus, he might need to be put on Coumadin.  Currently hemodynamically stable.  Denies any chest pain.  Insulin-dependent diabetes mellitus: Hemoglobin was 11.2% in September 2019.  On Lantus and NovoLog at home.  Continue monitoring his blood sugars.  Continue Lantus and sliding his insulin.  Possible community-acquired pneumonia: Reports recent sore throat and rhinorrhea, bloody cough.  No fever or chills.  CT findings history of atypical pneumonia.  Antibiotics changed to oral .  Respiratory status stable.  Morbid obesity: Emphasized on the importance of balanced diet and exercise when he is discharged from the hospital.         DVT prophylaxis: Heparin Code Status: Full Family Communication: Family member present at the bedside Disposition Plan: Home after full work-up   Consultants: Cardiology, cardiothoracic surgery, vascular surgery  Procedures: Thromboembolectomy  Antimicrobials: Levaquin day 2/6  Subjective: Patient seen and examined the bedside this morning.  Remains comfortable.  Right lower extremity numbness and tingling has resolved .  Respiratory status stable.  Denies any chest pain or shortness of breath.  Complains of discomfort on the right thigh where the drain is placed.  Objective: Vitals:   08/19/18 1948 08/19/18 2325 08/20/18  0415 08/20/18 0830  BP: 119/61 126/66 138/79 (!) 141/69  Pulse: 88 88  88 96  Resp: (!) 25 18 15  (!) 22  Temp: 98.7 F (37.1 C) 98.2 F (36.8 C) 99.4 F (37.4 C) (!) 100.5 F (38.1 C)  TempSrc: Oral Oral Oral Oral  SpO2: 98% 97% 97% 96%    Intake/Output Summary (Last 24 hours) at 08/20/2018 1012 Last data filed at 08/20/2018 0953 Gross per 24 hour  Intake 1309.91 ml  Output 5821 ml  Net -4511.09 ml   There were no vitals filed for this visit.  Examination:  General exam: Appears calm and comfortable ,Not in distress,obese HEENT:PERRL,Oral mucosa moist, Ear/Nose normal on gross exam Respiratory system: Bilateral equal air entry, normal vesicular breath sounds, no wheezes or crackles  Cardiovascular system: S1 & S2 heard, RRR. No JVD, murmurs, rubs, gallops or clicks. No pedal edema. Gastrointestinal system: Abdomen is nondistended, soft and nontender. No organomegaly or masses felt. Normal bowel sounds heard. Central nervous system: Alert and oriented. No focal neurological deficits. Extremities: No edema, no clubbing ,no cyanosis, distal peripheral pulses palpable.Drain on the right thigh Skin: No rashes, lesions or ulcers,no icterus ,no pallor MSK: Normal muscle bulk,tone ,power Psychiatry: Judgement and insight appear normal. Mood & affect appropriate.     Data Reviewed: I have personally reviewed following labs and imaging studies  CBC: Recent Labs  Lab 08/17/18 1925 08/19/18 0708 08/20/18 0345  WBC 13.8* 11.1* 11.1*  NEUTROABS  --  8.3* 6.6  HGB 15.5 11.3* 11.8*  HCT 46.7 35.7* 35.3*  MCV 85.1 86.4 84.2  PLT 408* 267 253   Basic Metabolic Panel: Recent Labs  Lab 08/17/18 1925 08/18/18 0643 08/20/18 0345  NA 137 136 137  K 3.6 3.8 3.4*  CL 98 100 101  CO2 20* 22 26  GLUCOSE 280* 266* 181*  BUN 8 7 6   CREATININE 1.20 1.02 1.02  CALCIUM 8.8* 8.4* 8.1*   GFR: CrCl cannot be calculated (Unknown ideal weight.). Liver Function Tests: No results for input(s): AST, ALT, ALKPHOS, BILITOT, PROT, ALBUMIN in the last 168  hours. No results for input(s): LIPASE, AMYLASE in the last 168 hours. No results for input(s): AMMONIA in the last 168 hours. Coagulation Profile: Recent Labs  Lab 08/19/18 0422 08/20/18 0345  INR 1.08 1.07   Cardiac Enzymes: Recent Labs  Lab 08/18/18 1808  TROPONINI 0.04*   BNP (last 3 results) No results for input(s): PROBNP in the last 8760 hours. HbA1C: No results for input(s): HGBA1C in the last 72 hours. CBG: Recent Labs  Lab 08/19/18 1638 08/19/18 1955 08/20/18 0013 08/20/18 0416 08/20/18 0815  GLUCAP 197* 263* 212* 176* 212*   Lipid Profile: No results for input(s): CHOL, HDL, LDLCALC, TRIG, CHOLHDL, LDLDIRECT in the last 72 hours. Thyroid Function Tests: No results for input(s): TSH, T4TOTAL, FREET4, T3FREE, THYROIDAB in the last 72 hours. Anemia Panel: No results for input(s): VITAMINB12, FOLATE, FERRITIN, TIBC, IRON, RETICCTPCT in the last 72 hours. Sepsis Labs: No results for input(s): PROCALCITON, LATICACIDVEN in the last 168 hours.  Recent Results (from the past 240 hour(s))  Culture, sputum-assessment     Status: None   Collection Time: 08/18/18 10:19 PM  Result Value Ref Range Status   Specimen Description EXPECTORATED SPUTUM  Final   Special Requests Normal  Final   Sputum evaluation   Final    THIS SPECIMEN IS ACCEPTABLE FOR SPUTUM CULTURE Performed at [facility], [address] [place], [place] [address]  Report Status 08/19/2018 FINAL  Final  Culture, respiratory     Status: None (Preliminary result)   Collection Time: 08/18/18 10:19 PM  Result Value Ref Range Status   Specimen Description EXPECTORATED SPUTUM  Final   Special Requests Normal Reflexed from [id number]  Final   Gram Stain   Final    RARE WBC PRESENT, PREDOMINANTLY PMN RARE GRAM POSITIVE RODS Performed at [facility], [address] [place], [place] [address]    Culture FEW Consistent with normal respiratory flora.  Final   Report Status PENDING  Incomplete          Radiology Studies: Vas Koreas Lower Extremity Venous (dvt)  Result Date: 08/19/2018  Lower Venous Study Indications: Pain, and Large mobile thrombus in LV.  Performing Technologist: Sherren Kernsandace Kanady RVS  Examination Guidelines: A complete evaluation includes B-mode imaging, spectral Doppler, color Doppler, and power Doppler as needed of all accessible portions of each vessel. Bilateral testing is considered an integral part of a complete examination. Limited examinations for reoccurring indications may be performed as noted.  Right Venous Findings: +---------+---------------+---------+-----------+----------+-------+          CompressibilityPhasicitySpontaneityPropertiesSummary +---------+---------------+---------+-----------+----------+-------+ CFV      Full           Yes      Yes                          +---------+---------------+---------+-----------+----------+-------+ SFJ      Full                                                 +---------+---------------+---------+-----------+----------+-------+ FV Prox  Full                                                 +---------+---------------+---------+-----------+----------+-------+ FV Mid   Full                                                 +---------+---------------+---------+-----------+----------+-------+ FV DistalFull                                                 +---------+---------------+---------+-----------+----------+-------+ PFV      Full                                                 +---------+---------------+---------+-----------+----------+-------+ POP      Full           Yes      Yes                          +---------+---------------+---------+-----------+----------+-------+ PTV      Full                                                 +---------+---------------+---------+-----------+----------+-------+  PERO     Full                                                  +---------+---------------+---------+-----------+----------+-------+ GSV      Full                                                 +---------+---------------+---------+-----------+----------+-------+  Left Venous Findings: +---+---------------+---------+-----------+----------+-------+    CompressibilityPhasicitySpontaneityPropertiesSummary +---+---------------+---------+-----------+----------+-------+ CFVFull           Yes      Yes                          +---+---------------+---------+-----------+----------+-------+    Summary: Right: There is no evidence of deep vein thrombosis in the lower extremity. There is no evidence of superficial venous thrombosis. Incidentally, there is no arterial flow noted in the mid to distal femoral artery, popliteal, posterior tibial, anterior tibial, peroneal, and dorsalis pedis arteries. Left: No evidence of common femoral vein obstruction.  *See table(s) above for measurements and observations. Electronically signed by Sherald Hess MD on 08/19/2018 at 10:39:06 AM.    Final         Scheduled Meds: . insulin aspart  0-9 Units Subcutaneous Q4H  . insulin glargine  15 Units Subcutaneous QHS  . levofloxacin  750 mg Oral Daily  . sodium chloride flush  3 mL Intravenous Q12H   Continuous Infusions: . heparin 2,250 Units/hr (08/20/18 0600)     LOS: 1 day    Time spent: 35 mins.More than 50% of that time was spent in counseling and/or coordination of care.      Burnadette Pop, MD [facility] Pager [phone]  If 7PM-7AM, please contact night-coverage [email or web address] Password [email or web address] 08/20/2018, 10:12 AM

## 2018-08-21 ENCOUNTER — Encounter (HOSPITAL_COMMUNITY): Payer: Self-pay | Admitting: Internal Medicine

## 2018-08-21 ENCOUNTER — Other Ambulatory Visit: Payer: Self-pay

## 2018-08-21 ENCOUNTER — Inpatient Hospital Stay (HOSPITAL_COMMUNITY)
Admission: EM | Disposition: A | Discharge status: Home / Self Care | Payer: Self-pay | Source: Home / Self Care | Attending: Cardiothoracic Surgery

## 2018-08-21 DIAGNOSIS — I5022 Chronic systolic (congestive) heart failure: Secondary | ICD-10-CM

## 2018-08-21 DIAGNOSIS — I240 Acute coronary thrombosis not resulting in myocardial infarction: Secondary | ICD-10-CM

## 2018-08-21 HISTORY — PX: CORONARY ANGIOGRAPHY: CATH118303

## 2018-08-21 LAB — GLUCOSE, CAPILLARY
Glucose-Capillary: 146 mg/dL — ABNORMAL HIGH (ref 70–99)
Glucose-Capillary: 176 mg/dL — ABNORMAL HIGH (ref 70–99)
Glucose-Capillary: 258 mg/dL — ABNORMAL HIGH (ref 70–99)
Glucose-Capillary: 308 mg/dL — ABNORMAL HIGH (ref 70–99)
Glucose-Capillary: 322 mg/dL — ABNORMAL HIGH (ref 70–99)
Glucose-Capillary: 338 mg/dL — ABNORMAL HIGH (ref 70–99)

## 2018-08-21 LAB — CBC
HCT: 35.7 % — ABNORMAL LOW (ref 39.0–52.0)
Hemoglobin: 12.1 g/dL — ABNORMAL LOW (ref 13.0–17.0)
MCH: 28.6 pg (ref 26.0–34.0)
MCHC: 33.9 g/dL (ref 30.0–36.0)
MCV: 84.4 fL (ref 80.0–100.0)
PLATELETS: 238 10*3/uL (ref 150–400)
RBC: 4.23 MIL/uL (ref 4.22–5.81)
RDW: 13.3 % (ref 11.5–15.5)
WBC: 11.4 10*3/uL — ABNORMAL HIGH (ref 4.0–10.5)

## 2018-08-21 LAB — BASIC METABOLIC PANEL
Anion gap: 11 (ref 5–15)
BUN: 6 mg/dL (ref 6–20)
CO2: 25 mmol/L (ref 22–32)
Calcium: 8.9 mg/dL (ref 8.9–10.3)
Chloride: 100 mmol/L (ref 98–111)
Creatinine, Ser: 0.97 mg/dL (ref 0.61–1.24)
GFR calc Af Amer: >60 mL/min (ref >60)
GFR calc non Af Amer: >60 mL/min (ref >60)
Glucose, Bld: 179 mg/dL — ABNORMAL HIGH (ref 70–99)
Potassium: 3.8 mmol/L (ref 3.5–5.1)
Sodium: 136 mmol/L (ref 135–145)

## 2018-08-21 LAB — CULTURE, RESPIRATORY W GRAM STAIN
Culture: NORMAL
Special Requests: NORMAL

## 2018-08-21 LAB — CARDIOLIPIN ANTIBODIES, IGG, IGM, IGA
Anticardiolipin IgA: <9 APL U/mL (ref 0–11)
Anticardiolipin IgG: <9 GPL U/mL (ref 0–14)
Anticardiolipin IgM: <9 MPL U/mL (ref 0–12)

## 2018-08-21 LAB — PROTEIN S, TOTAL: Protein S Ag, Total: 91 % (ref 60–150)

## 2018-08-21 LAB — BETA-2-GLYCOPROTEIN I ABS, IGG/M/A
Beta-2 Glyco I IgG: <9 GPI IgG units (ref 0–20)
Beta-2-Glycoprotein I IgA: <9 GPI IgA units (ref 0–25)

## 2018-08-21 LAB — CULTURE, RESPIRATORY

## 2018-08-21 LAB — PROTIME-INR
INR: 1.08
Prothrombin Time: 13.9 seconds (ref 11.4–15.2)

## 2018-08-21 LAB — HEPARIN LEVEL (UNFRACTIONATED)
Heparin Unfractionated: 0.25 IU/mL — ABNORMAL LOW (ref 0.30–0.70)
Heparin Unfractionated: 0.5 IU/mL (ref 0.30–0.70)

## 2018-08-21 LAB — PROTEIN S ACTIVITY: Protein S Activity: 70 % (ref 63–140)

## 2018-08-21 LAB — PROTEIN C ACTIVITY: Protein C Activity: 104 % (ref 73–180)

## 2018-08-21 LAB — PROTEIN C, TOTAL: Protein C, Total: 78 % (ref 60–150)

## 2018-08-21 SURGERY — CORONARY ANGIOGRAPHY (CATH LAB)
Anesthesia: LOCAL

## 2018-08-21 MED ORDER — MIDAZOLAM HCL 2 MG/2ML IJ SOLN
INTRAMUSCULAR | Status: AC
  Filled 2018-08-21: qty 2

## 2018-08-21 MED ORDER — HEPARIN (PORCINE) 25000 UT/250ML-% IV SOLN
2400.0000 [IU]/h | INTRAVENOUS | Status: DC
  Administered 2018-08-21 – 2018-08-22 (×3): 2400 [IU]/h via INTRAVENOUS
  Filled 2018-08-21 (×4): qty 250

## 2018-08-21 MED ORDER — MIDAZOLAM HCL 2 MG/2ML IJ SOLN
INTRAMUSCULAR | Status: DC | PRN
  Administered 2018-08-21: 1 mg via INTRAVENOUS

## 2018-08-21 MED ORDER — ACETAMINOPHEN 325 MG PO TABS: 650.0000 mg | ORAL_TABLET | ORAL | Status: DC | PRN

## 2018-08-21 MED ORDER — ONDANSETRON HCL 4 MG/2ML IJ SOLN: 4.0000 mg | Freq: Four times a day (QID) | INTRAMUSCULAR | Status: DC | PRN

## 2018-08-21 MED ORDER — VERAPAMIL HCL 2.5 MG/ML IV SOLN
INTRAVENOUS | Status: AC
  Filled 2018-08-21: qty 2

## 2018-08-21 MED ORDER — DIPHENHYDRAMINE HCL 25 MG PO CAPS: 50.0000 mg | ORAL_CAPSULE | Freq: Once | ORAL | Status: AC

## 2018-08-21 MED ORDER — HEPARIN (PORCINE) IN NACL 1000-0.9 UT/500ML-% IV SOLN
INTRAVENOUS | Status: DC | PRN
  Administered 2018-08-21 (×2): 500 mL

## 2018-08-21 MED ORDER — PNEUMOCOCCAL VAC POLYVALENT 25 MCG/0.5ML IJ INJ: 0.5000 mL | INJECTION | INTRAMUSCULAR | Status: DC | PRN

## 2018-08-21 MED ORDER — SODIUM CHLORIDE 0.9 % IV SOLN: INTRAVENOUS | Status: AC

## 2018-08-21 MED ORDER — FENTANYL CITRATE (PF) 100 MCG/2ML IJ SOLN
INTRAMUSCULAR | Status: AC
  Filled 2018-08-21: qty 2

## 2018-08-21 MED ORDER — FENTANYL CITRATE (PF) 100 MCG/2ML IJ SOLN
INTRAMUSCULAR | Status: DC | PRN
  Administered 2018-08-21: 25 ug via INTRAVENOUS

## 2018-08-21 MED ORDER — INSULIN GLARGINE 100 UNIT/ML ~~LOC~~ SOLN
30.0000 [IU] | Freq: Every day | SUBCUTANEOUS | Status: DC
  Administered 2018-08-21: 30 [IU] via SUBCUTANEOUS
  Filled 2018-08-21: qty 0.3

## 2018-08-21 MED ORDER — SODIUM CHLORIDE 0.9 % IV SOLN: 250.0000 mL | INTRAVENOUS | Status: DC | PRN

## 2018-08-21 MED ORDER — SODIUM CHLORIDE 0.9% FLUSH: 3.0000 mL | INTRAVENOUS | Status: DC | PRN

## 2018-08-21 MED ORDER — LIDOCAINE HCL (PF) 1 % IJ SOLN
INTRAMUSCULAR | Status: AC
  Filled 2018-08-21: qty 60

## 2018-08-21 MED ORDER — INSULIN ASPART 100 UNIT/ML ~~LOC~~ SOLN
0.0000 [IU] | Freq: Every day | SUBCUTANEOUS | Status: DC
  Administered 2018-08-21: 3 [IU] via SUBCUTANEOUS
  Administered 2018-08-22: 2 [IU] via SUBCUTANEOUS

## 2018-08-21 MED ORDER — VERAPAMIL HCL 2.5 MG/ML IV SOLN
INTRAVENOUS | Status: DC | PRN
  Administered 2018-08-21: 10 mL via INTRA_ARTERIAL

## 2018-08-21 MED ORDER — HEPARIN SODIUM (PORCINE) 1000 UNIT/ML IJ SOLN
INTRAMUSCULAR | Status: AC
  Filled 2018-08-21: qty 1

## 2018-08-21 MED ORDER — SODIUM CHLORIDE 0.9% FLUSH
3.0000 mL | Freq: Two times a day (BID) | INTRAVENOUS | Status: DC
  Administered 2018-08-21 – 2018-08-23 (×3): 3 mL via INTRAVENOUS

## 2018-08-21 MED ORDER — DIPHENHYDRAMINE HCL 50 MG/ML IJ SOLN
50.0000 mg | Freq: Once | INTRAMUSCULAR | Status: AC
  Administered 2018-08-21: 50 mg via INTRAVENOUS

## 2018-08-21 MED ORDER — HEPARIN (PORCINE) IN NACL 1000-0.9 UT/500ML-% IV SOLN
INTRAVENOUS | Status: AC
  Filled 2018-08-21: qty 1000

## 2018-08-21 MED ORDER — ASPIRIN 81 MG PO CHEW
81.0000 mg | CHEWABLE_TABLET | Freq: Once | ORAL | Status: AC
  Administered 2018-08-21: 81 mg via ORAL
  Filled 2018-08-21: qty 1

## 2018-08-21 MED ORDER — IOHEXOL 350 MG/ML SOLN
INTRAVENOUS | Status: DC | PRN
  Administered 2018-08-21: 60 mL via INTRA_ARTERIAL

## 2018-08-21 MED ORDER — METHYLPREDNISOLONE SODIUM SUCC 40 MG IJ SOLR
40.0000 mg | INTRAMUSCULAR | Status: DC
  Administered 2018-08-21 (×2): 40 mg via INTRAVENOUS
  Filled 2018-08-21 (×2): qty 1

## 2018-08-21 MED ORDER — HEPARIN SODIUM (PORCINE) 1000 UNIT/ML IJ SOLN
INTRAMUSCULAR | Status: DC | PRN
  Administered 2018-08-21: 6000 [IU] via INTRAVENOUS

## 2018-08-21 MED ORDER — INFLUENZA VAC SPLIT QUAD 0.5 ML IM SUSY: 0.5000 mL | PREFILLED_SYRINGE | INTRAMUSCULAR | Status: DC | PRN

## 2018-08-21 MED ORDER — INSULIN ASPART 100 UNIT/ML ~~LOC~~ SOLN
0.0000 [IU] | Freq: Three times a day (TID) | SUBCUTANEOUS | Status: DC
  Administered 2018-08-21 (×2): 15 [IU] via SUBCUTANEOUS
  Administered 2018-08-22 (×3): 7 [IU] via SUBCUTANEOUS
  Administered 2018-08-23: 4 [IU] via SUBCUTANEOUS
  Administered 2018-08-23: 7 [IU] via SUBCUTANEOUS
  Administered 2018-08-23: 4 [IU] via SUBCUTANEOUS

## 2018-08-21 SURGICAL SUPPLY — 10 items
CATH INFINITI 5 FR JL3.5 (CATHETERS) ×1 IMPLANT
CATH INFINITI JR4 5F (CATHETERS) ×1 IMPLANT
DEVICE RAD COMP TR BAND LRG (VASCULAR PRODUCTS) ×1 IMPLANT
GLIDESHEATH SLEND SS 6F .021 (SHEATH) ×1 IMPLANT
GUIDEWIRE INQWIRE 1.5J.035X260 (WIRE) IMPLANT
INQWIRE 1.5J .035X260CM (WIRE) ×2
KIT HEART LEFT (KITS) ×2 IMPLANT
PACK CARDIAC CATHETERIZATION (CUSTOM PROCEDURE TRAY) ×2 IMPLANT
TRANSDUCER W/STOPCOCK (MISCELLANEOUS) ×2 IMPLANT
TUBING CIL FLEX 10 FLL-RA (TUBING) ×2 IMPLANT

## 2018-08-21 NOTE — Progress Notes (Signed)
Patient ID: Marcus SpillerWilliam L Muilenburg Jr., male   DOB: [birth year], 47 y.o.   MRN: [id number]      [address]       [place] [id number]             [phone]                 Day of Surgery Procedure(s) (LRB): CORONARY ANGIOGRAPHY (CATH LAB) (N/A)  LOS: 2 days   Subjective: Patient feels well today  Objective: Vital signs in last 24 hours: Patient Vitals for the past 24 hrs:  BP Temp Temp src Pulse Resp SpO2 Height Weight  08/21/18 1100 126/77 - - 80 20 97 % 5\' 11"  (1.803 m) -  08/21/18 1045 122/73 - - 70 15 96 % - -  08/21/18 1030 127/74 - - 74 19 92 % - -  08/21/18 1015 127/81 - - - 18 (!) 89 % - -  08/21/18 1003 124/74 98.3 F (36.8 C) Oral 74 18 91 % - -  08/21/18 0944 137/87 - - 84 12 96 % - -  08/21/18 0939 132/86 - - 89 (!) 23 98 % - -  08/21/18 0934 (!) 152/101 - - 88 (!) 25 100 % - -  08/21/18 0929 (!) 141/95 - - 88 (!) 22 98 % - -  08/21/18 0924 (!) 148/95 - - 87 12 99 % - -  08/21/18 0919 (!) 133/93 - - 85 15 99 % - -  08/21/18 0914 (!) 135/96 - - 93 19 96 % - -  08/21/18 0913 - - - 81 - - - -  08/21/18 0401 (!) 146/97 99.3 F (37.4 C) Oral 87 (!) 30 94 % - -  08/21/18 0037 - - - - (!) 21 - - (!) 140.3 kg  08/20/18 2234 125/81 - - - (!) 26 - - -  08/20/18 1208 120/73 98.2 F (36.8 C) Oral 90 (!) 21 97 % - -    Filed Weights   08/21/18 0037  Weight: (!) 140.3 kg    Hemodynamic parameters for last 24 hours:    Intake/Output from previous day: 12/23 0701 - 12/24 0700 In: 499.2 [I.V.:499.2] Out: 3730 [Urine:3700; Drains:30] Intake/Output this shift: Total I/O In: -  Out: 700 [Urine:700]  Scheduled Meds: . insulin aspart  0-20 Units Subcutaneous TID WC  . insulin aspart  0-5 Units Subcutaneous QHS  . insulin glargine  30 Units Subcutaneous QHS  . levofloxacin  750 mg Oral Daily  . sodium chloride flush  3 mL Intravenous Q12H  . sodium chloride flush  3 mL Intravenous Q12H   Continuous Infusions: . sodium chloride 50 mL/hr at 08/21/18  1012  . sodium chloride    . heparin     PRN Meds:.sodium chloride, acetaminophen, alum & mag hydroxide-simeth, diphenhydrAMINE, fentaNYL (SUBLIMAZE) injection, ondansetron (ZOFRAN) IV, ondansetron **OR** [DISCONTINUED] ondansetron (ZOFRAN) IV, oxyCODONE-acetaminophen, senna-docusate, sodium chloride flush  General appearance: alert and cooperative Neurologic: intact Feet warm palpable DP and PT pulses bilaterally Patient's wristband is in place  Lab Results: CBC: Recent Labs    08/20/18 0345 08/21/18 0229  WBC 11.1* 11.4*  HGB 11.8* 12.1*  HCT 35.3* 35.7*  PLT 253 238   BMET:  Recent Labs    08/20/18 0345 08/21/18 0549  NA 137 136  K 3.4* 3.8  CL 101 100  CO2 26 25  GLUCOSE 181* 179*  BUN 6 6  CREATININE 1.02 0.97  CALCIUM 8.1* 8.9  PT/INR:  Recent Labs    08/21/18 0229  LABPROT 13.9  INR 1.08     Radiology Mr Cardiac Morphology W Wo Contrast  Result Date: 08/20/2018 CLINICAL DATA:  LV Mass EXAM: CARDIAC MRI TECHNIQUE: The patient was scanned on a 1.5 Tesla Siemens magnet. A dedicated cardiac coil was used. Functional imaging was done using Fiesta sequences. 2,3, and 4 chamber views were done to assess for RWMA's. Modified Simpson's rule using a short axis stack was used to calculate an ejection fraction on a dedicated work Research officer, trade unionstation using Circle software. The patient received 10 cc of Gadavist . After 10 minutes inversion recovery sequences were used to assess for infiltration and scar tissue. CONTRAST:  Gavavist FINDINGS: Study was limited due to patients body habitus and claustrophobia There was mild LAE. The RA/RV were normal in size and function There was no obvious PFO/ASD. There was no effusion Aortic root was normal. The LV was moderately dilated The distal anterior wall, apex and septum were hypokinetic. The quantitative EF was 38% (EDV 193 cc, ESV 119 cc SV 74 cc). Delayed enhancement images suboptimal but showed some scar in the distal anterior wall,  septum and apex. However post gadolinium long TI images were diagnostic for large LV thrombus emanating from hypokinetic apex cephalad to the LVOt. Thrombus measures 6 cm x 1.6 cm and is highly mobile and of extremely high embolic potential IMPRESSION: 1.Moderate LVE with septal, apical and distal anterior wall hypokinesis EF 38% 2.Very large mobile thrombus 6 cm x 1.5 cm emanating form apex to LVOT High embolic potential 3.No obvious PFO/ASD study limited by patients body habitus and claustrophobia 4.Mild LAE Charlton HawsPeter Nishan Electronically Signed   By: Charlton HawsPeter  Nishan M.D.   On: 08/20/2018 14:33     Assessment/Plan: S/P Procedure(s) (LRB): CORONARY ANGIOGRAPHY (CATH LAB) (N/A)  Cath reviewed- 100 % LAD , chronic and most likely atherosclerotic  not embolis with collateral flow. With apical anterior hypokenesis not pressing need for cabg.  Since large and mobile lv thrombi are relatively rare there are no studies addressed the risk of embolization, there is a seem to be higher with mobile thrombus than with mural thrombus, there is little guidance in the literature on the best approach is anticoagulation versus surgical.  The patient embolized to the right leg prior to being anticoagulated.  Needs heparin restarted as soon as possible after cath Would assume  NOCs would offer best long-term anticoagulation after immediate treatment, but even this is not confirmed with good studies. I discussed the situation with the patient and his family in detail. We will continue to follow closely, keep on heparin for now.  Delight OvensEdward B Tymeka Privette MD 08/21/2018 11:39 AM

## 2018-08-21 NOTE — Interval H&P Note (Signed)
History and Physical Interval Note:  08/21/2018 9:08 AM  Marcus SpillerWilliam L Xin Jr.  has presented today for surgery, with the diagnosis of Heart Failure  The various methods of treatment have been discussed with the patient and family. After consideration of risks, benefits and other options for treatment, the patient has consented to  Procedure(s): CORONARY ANGIOGRAPHY (CATH LAB) (N/A) and possible coronary angioplasty as a surgical intervention .  The patient's history has been reviewed, patient examined, no change in status, stable for surgery.  I have reviewed the patient's chart and labs.  Questions were answered to the patient's satisfaction.     Daniel Bensimhon

## 2018-08-21 NOTE — Progress Notes (Signed)
ANTICOAGULATION CONSULT NOTE  Pharmacy Consult for Heparin  Indication: pulmonary embolus and DVT, LV thrombus  Allergies  Allergen Reactions  . Contrast Media [Iodinated Diagnostic Agents] Itching    Pt states he thinks it makes him itch  . Lovenox [Enoxaparin Sodium] Itching    Itching over entire body  . Percocet [Oxycodone-Acetaminophen] Hives and Itching    Patient Measurements: ~135 kg  Vital Signs: Temp: 98.3 F (36.8 C) (12/24 1003) Temp Source: Oral (12/24 1003) BP: 124/74 (12/24 1003) Pulse Rate: 74 (12/24 1003)  Labs: Recent Labs    08/18/18 1808  08/19/18 0422  08/19/18 0708 08/19/18 1343 08/20/18 0345 08/21/18 0229 08/21/18 0549  HGB  --   --   --    < > 11.3*  --  11.8* 12.1*  --   HCT  --   --   --   --  35.7*  --  35.3* 35.7*  --   PLT  --   --   --   --  267  --  253 238  --   LABPROT  --   --  13.9  --   --   --  13.8 13.9  --   INR  --   --  1.08  --   --   --  1.07 1.08  --   HEPARINUNFRC  --    < > 0.15*  --   --  0.43 0.44 0.25*  --   CREATININE  --   --   --   --   --   --  1.02  --  0.97  TROPONINI 0.04*  --   --   --   --   --   --   --   --    < > = values in this interval not displayed.    Estimated Creatinine Clearance: 136.3 mL/min (by C-G formula based on SCr of 0.97 mg/dL).  Assessment: 47 y/o M with recent history of DVT and PE, he had been on Xarelto until last month, unable to obtain due to financial reasons, now presents to the ED with chest pain, CT angio with PE and LV thrombus. Now s/p embolectomy.   Coronary angio on 12/24 AM showed 100% stenosis of prox LAD and 80% stenosis of diag. Pharmacy consulted to restart heparin 4 hours after sheath removal @ 0947.   Goal of Therapy:  Heparin level 0.3-0.7 units/ml Monitor platelets by anticoagulation protocol: Yes   Plan:  Restartheparin at2400 units/hr starting today at 1400 Heparin level today at 2200 Daily CBC/HL, monitor for bleeding F/u TCTS consult, and  hypercoagulable workup / longterm AC plans  Thank you for involving pharmacy in this patient's care.  Wendelyn Breslowylan Hanna, PharmD PGY1 Pharmacy Resident Phone: [phone] 08/21/2018 10:08 AM

## 2018-08-21 NOTE — Progress Notes (Signed)
ANTICOAGULATION CONSULT NOTE  Pharmacy Consult for Heparin  Indication: pulmonary embolus and DVT, LV thrombus  Allergies  Allergen Reactions  . Contrast Media [Iodinated Diagnostic Agents] Itching    Pt states he thinks it makes him itch  . Lovenox [Enoxaparin Sodium] Itching    Itching over entire body  . Percocet [Oxycodone-Acetaminophen] Hives and Itching    Patient Measurements: ~135 kg  Vital Signs: Temp: 99.3 F (37.4 C) (12/24 0401) Temp Source: Oral (12/24 0401) BP: 146/97 (12/24 0401) Pulse Rate: 87 (12/24 0401)  Labs: Recent Labs    08/18/18 [id number]  08/18/18 1808  08/19/18 0422  08/19/18 0708 08/19/18 1343 08/20/18 0345 08/21/18 0229  HGB  --   --   --   --   --    < > 11.3*  --  11.8* 12.1*  HCT  --   --   --   --   --   --  35.7*  --  35.3* 35.7*  PLT  --   --   --   --   --   --  267  --  253 238  LABPROT  --   --   --   --  13.9  --   --   --  13.8 13.9  INR  --   --   --   --  1.08  --   --   --  1.07 1.08  HEPARINUNFRC  --    < >  --    < > 0.15*  --   --  0.43 0.44 0.25*  CREATININE 1.02  --   --   --   --   --   --   --  1.02  --   TROPONINI  --   --  0.04*  --   --   --   --   --   --   --    < > = values in this interval not displayed.    Estimated Creatinine Clearance: 129.7 mL/min (by C-G formula based on SCr of 1.02 mg/dL).  Assessment: 47 y.o. male with h/o VTE s/p thrombectomy 12/21, for heparin   Goal of Therapy:  Heparin level 0.3-0.7 units/ml Monitor platelets by anticoagulation protocol: Yes   Plan:  Increase Heparin 2400 units/hr Follow up after cath today   Geannie RisenGreg Kartel Wolbert, PharmD, BCPS  08/21/2018 5:32 AM

## 2018-08-21 NOTE — Progress Notes (Signed)
3cc air removed from TR band. Site remains level 0.  Pt sleeping. O2 sats 83% - 97% as pt is snoring. Placed on 2L nasal cannula.   Leonidas Rombergaitlin S Bumbledare, RN

## 2018-08-21 NOTE — Progress Notes (Signed)
Pt received from cath lab. R radial site level 0. TR band with 16 cc air. R Radial pulse 2+, capillary refill less than 3 seconds, strong R hand grip. Pt denies itching from contrast, educated to call RN if he needs Benadryl.   VSS: 98.58F 124/74 HR 74 O2 91% on room air.   Will continue to monitor. To restart Heparin at 1350.  Leonidas Rombergaitlin S Bumbledare, RN

## 2018-08-21 NOTE — Progress Notes (Signed)
Pt transferred to cath lab.   0831 given 50 mg IV Benadryl and 40 mg IV SoluMedrol. 0831 given 7 units novolog for CBG of 308.  0830 received order to DC JP drain, no vaseline gauze on unit, ordered and will discontinue drain on return from cath lab.   0845 Heparin drip stopped prior to transport.   Leonidas Rombergaitlin S Bumbledare, RN

## 2018-08-21 NOTE — Progress Notes (Signed)
Spoke with VVS and hospitalist. Both okay with the patient ambulating and ending bedrest. Patient and family updated.   Leonidas Rombergaitlin S Bumbledare, RN

## 2018-08-21 NOTE — Progress Notes (Signed)
PROGRESS NOTE    Marcus SpillerWilliam L Nafziger Jr.  ZOX:[id number] DOB: [birth year] DOA: 08/17/2018 PCP: [facility]   Brief Narrative: Patient is a 47 year old male with past medical history of insulin-dependent diabetes mellitus, right leg DVT, recurrent PE we stopped anticoagulation prematurely due to financial condition who presented to the emergency department yesterday night with complaints of acute onset of substernal chest pain, shortness of breath and right lower extremity numbness.  Patient was found to have small non-occlusive right upper lobe PE without right heart strain but also large left liver lesion ventricular thrombus.  Patient started on heparin drip.  Cardiology consulted.  Given the size of left ventricular thrombus we also consulted cardiothoracic surgery who recommended to do TEE. He was found to have decreased  arterial flow on his left lower extremity very concerning for right lower extremity thromboembolism.  There was no flow in SFA/popliteal/tibials on duplex. Consulted vascular surgery and he is underwent emergent vascular intervention/thrombectomy on 08/18/18.Underwent Cardiac MRI and left heart catheterization.  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  No chest pain, troponins negative.  Cardiology, cardiothoracic surgery following.  Continue heparin drip.  Follow up hypercoagulable workup. Cardiac MRI showed very large mobile thrombus 6 cm x 1.5 cm.  Moderate left ventricular enlargement with septal, apical and distal anterior wall hypokinesis.  Ejection fraction of  38%.  Coronary artery disease: Left heart catheterization today showed CAD with totally occluded mid LAD lesion and high grade lesion in diagonal ostium.Mild diffuse CAD.  Right lower extremity thromboembolism: Presented with right lower extremity numbness and tingling.  Found to have no flow in SFA/popliteal/tibials on duplex.   Underwent emergent thromboembolectomy by vascular surgery.  Continue heparin drip.  He has a drain on his right thigh.  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  He was on Xarelto but he stopped taking that due to financial conditions.  Presented at this time with shortness of breath and chest pain.  Found to have small nonocclusive PE involving right upper lobe without CT evidence of heart strain.  Currently on heparin drip.  Due to presence of left ventricular thrombus, he might need to be put on Coumadin.  Currently hemodynamically stable.  Denies any chest pain.  Insulin-dependent diabetes mellitus: Hemoglobin was 11.2% in September 2019.  On Lantus and NovoLog at home.  Continue monitoring his blood sugars.  Continue Lantus and sliding his insulin.  Possible community-acquired pneumonia: Reports recent sore throat and rhinorrhea, bloody cough.Much improved now/  No fever or chills.  CT findings history of atypical pneumonia.  Antibiotics changed to oral .  Respiratory status stable.  Morbid obesity: Emphasized on the importance of balanced diet and exercise when he is discharged from the hospital.         DVT prophylaxis: Heparin Code Status: Full Family Communication: Family member present at the bedside Disposition Plan: Home after full work-up   Consultants: Cardiology, cardiothoracic surgery, vascular surgery  Procedures: Thromboembolectomy, cardiac MRI, left heart catheterization  Antimicrobials: Levaquin day 3/6  Subjective: Patient seen and examined the bedside this morning.  Remains  comfortable.  Right lower  extremity numbness and tingling has resolved .  Respiratory status stable.  Denies any chest pain or shortness of breath.  Underwent cardiac cath today.  Objective: Vitals:   08/21/18 1015 08/21/18 1030 08/21/18 1045 08/21/18 1100  BP: 127/81 127/74 122/73 126/77  Pulse:  74 70 80  Resp: 18 19 15 20   Temp:      TempSrc:      SpO2: (!) 89% 92% 96% 97%  Weight:      Height:    5\' 11"  (1.803 m)    Intake/Output Summary (Last 24 hours) at 08/21/2018 1250 Last data filed at 08/21/2018 1004 Gross per 24 hour  Intake 499.2 ml  Output 3730 ml  Net -3230.8 ml   Filed Weights   08/21/18 0037  Weight: (!) 140.3 kg    Examination:  General exam: Appears calm and comfortable ,Not in distress,obese HEENT:PERRL,Oral mucosa moist, Ear/Nose normal on gross exam Respiratory system: Bilateral equal air entry, normal vesicular breath sounds, no wheezes or crackles  Cardiovascular system: S1 & S2 heard, RRR. No JVD, murmurs, rubs, gallops or clicks. No pedal edema. Gastrointestinal system: Abdomen is nondistended, soft and nontender. No organomegaly or masses felt. Normal bowel sounds heard. Central nervous system: Alert and oriented. No focal neurological deficits. Extremities: No edema, no clubbing ,no cyanosis, distal peripheral pulses palpable.Drain on the right thigh,very low output Skin: No rashes, lesions or ulcers,no icterus ,no pallor MSK: Normal muscle bulk,tone ,power Psychiatry: Judgement and insight appear normal. Mood & affect appropriate.     Data Reviewed: I have personally reviewed following labs and imaging studies  CBC: Recent Labs  Lab 08/17/18 1925 08/19/18 0708 08/20/18 0345 08/21/18 0229  WBC 13.8* 11.1* 11.1* 11.4*  NEUTROABS  --  8.3* 6.6  --   HGB 15.5 11.3* 11.8* 12.1*  HCT 46.7 35.7* 35.3* 35.7*  MCV 85.1 86.4 84.2 84.4  PLT 408* 267 253 238   Basic Metabolic Panel: Recent Labs  Lab 08/17/18 1925 08/18/18 0643  08/20/18 0345 08/21/18 0549  NA 137 136 137 136  K 3.6 3.8 3.4* 3.8  CL 98 100 101 100  CO2 20* 22 26 25   GLUCOSE 280* 266* 181* 179*  BUN 8 7 6 6   CREATININE 1.20 1.02 1.02 0.97  CALCIUM 8.8* 8.4* 8.1* 8.9   GFR: Estimated Creatinine Clearance: 136.3 mL/min (by C-G formula based on SCr of 0.97 mg/dL). Liver Function Tests: No results for input(s): AST, ALT, ALKPHOS, BILITOT, PROT, ALBUMIN in the last 168 hours. No results for input(s): LIPASE, AMYLASE in the last 168 hours. No results for input(s): AMMONIA in the last 168 hours. Coagulation Profile: Recent Labs  Lab 08/19/18 0422 08/20/18 0345 08/21/18 0229  INR 1.08 1.07 1.08   Cardiac Enzymes: Recent Labs  Lab 08/18/18 1808  TROPONINI 0.04*   BNP (last 3 results) No results for input(s): PROBNP in the last 8760 hours. HbA1C: No results for input(s): HGBA1C in the last 72 hours. CBG: Recent Labs  Lab 08/20/18 2028 08/21/18 0032 08/21/18 0358 08/21/18 0807 08/21/18 1117  GLUCAP 229* 176* 146* 308* 322*   Lipid Profile: No results for input(s): CHOL, HDL, LDLCALC, TRIG, CHOLHDL, LDLDIRECT in the last 72 hours. Thyroid Function Tests: No results for input(s): TSH, T4TOTAL, FREET4, T3FREE, THYROIDAB in the last 72 hours. Anemia Panel: No results for input(s): VITAMINB12, FOLATE, FERRITIN, TIBC, IRON, RETICCTPCT in the last 72 hours. Sepsis Labs: No results for input(s): PROCALCITON, LATICACIDVEN in the last 168 hours.  Recent Results (from the past  240 hour(s))  Culture, sputum-assessment     Status: None   Collection Time: 08/18/18 10:19 PM  Result Value Ref Range Status   Specimen Description EXPECTORATED SPUTUM  Final   Special Requests Normal  Final   Sputum evaluation   Final    THIS SPECIMEN IS ACCEPTABLE FOR SPUTUM CULTURE Performed at [facility], [address], [place], [place] [address]    Report Status 08/19/2018 FINAL  Final  Culture, respiratory     Status: None   Collection Time:  08/18/18 10:19 PM  Result Value Ref Range Status   Specimen Description EXPECTORATED SPUTUM  Final   Special Requests Normal Reflexed from [id number]  Final   Gram Stain   Final    RARE WBC PRESENT, PREDOMINANTLY PMN RARE GRAM POSITIVE RODS Performed at [facility], [address], [place], [place] [address]    Culture FEW Consistent with normal respiratory flora.  Final   Report Status 08/21/2018 FINAL  Final         Radiology Studies: Mr Cardiac Morphology W Wo Contrast  Result Date: 08/20/2018 CLINICAL DATA:  LV Mass EXAM: CARDIAC MRI TECHNIQUE: The patient was scanned on a 1.5 Tesla Siemens magnet. A dedicated cardiac coil was used. Functional imaging was done using Fiesta sequences. 2,3, and 4 chamber views were done to assess for RWMA's. Modified Simpson's rule using a short axis stack was used to calculate an ejection fraction on a dedicated work [employer]. The patient received 10 cc of Gadavist . After 10 minutes inversion recovery sequences were used to assess for infiltration and scar tissue. CONTRAST:  Gavavist FINDINGS: Study was limited due to patients body habitus and claustrophobia There was mild LAE. The RA/RV were normal in size and function There was no obvious PFO/ASD. There was no effusion Aortic root was normal. The LV was moderately dilated The distal anterior wall, apex and septum were hypokinetic. The quantitative EF was 38% (EDV 193 cc, ESV 119 cc SV 74 cc). Delayed enhancement images suboptimal but showed some scar in the distal anterior wall, septum and apex. However post gadolinium long TI images were diagnostic for large LV thrombus emanating from hypokinetic apex cephalad to the LVOt. Thrombus measures 6 cm x 1.6 cm and is highly mobile and of extremely high embolic potential IMPRESSION: 1.Moderate LVE with septal, apical and distal anterior wall hypokinesis EF 38% 2.Very large mobile thrombus 6 cm x 1.5 cm emanating form apex to LVOT High  embolic potential 3.No obvious PFO/ASD study limited by patients body habitus and claustrophobia 4.Mild LAE Charlton Haws Electronically Signed   By: Charlton Haws M.D.   On: 08/20/2018 14:33        Scheduled Meds: . insulin aspart  0-20 Units Subcutaneous TID WC  . insulin aspart  0-5 Units Subcutaneous QHS  . insulin glargine  30 Units Subcutaneous QHS  . levofloxacin  750 mg Oral Daily  . sodium chloride flush  3 mL Intravenous Q12H  . sodium chloride flush  3 mL Intravenous Q12H   Continuous Infusions: . sodium chloride 50 mL/hr at 08/21/18 1012  . sodium chloride    . heparin       LOS: 2 days    Time spent: 35 mins.More than 50% of that time was spent in counseling and/or coordination of care.      Burnadette Pop, MD [facility] Pager [phone]  If 7PM-7AM, please contact night-coverage [email or web address] Password [facility] 08/21/2018, 12:50 PM

## 2018-08-21 NOTE — Progress Notes (Signed)
Plan for coronary angiography today - preop. cMRI consistent with large mobile thrombus. LVEF 38% with septal, apical and distal anterior hypokinesis - LGE of these areas as well, suggestive of scar. No ASD/PFO.   Chrystie NoseKenneth C. Artice Bergerson, MD, [facility]) HospitalFACC, FACP  [place]  [facility]  Medical Director of the [facility]  [facility] Diplomate of the American Board of Clinical Lipidology Attending Cardiologist  Direct Dial: [phone]  Fax: [phone]  Website:  [email or web address]

## 2018-08-21 NOTE — Progress Notes (Addendum)
  Progress Note    08/21/2018 7:23 AM 3 Days Post-Op   Subjective:  R foot feeling much better.  Still some numbness but this is improving.   Vitals:   08/21/18 0037 08/21/18 0401  BP:  (!) 146/97  Pulse:  87  Resp: (!) 21 (!) 30  Temp:  99.3 F (37.4 C)  SpO2:  94%   Physical Exam: Lungs:  Non labored Incisions:  R groin incision c/d/i; <10cc over 12 hours JP drain, serosanguinous Extremities:  Palpable R DP; soft R calf Abdomen:  Soft Neurologic: A&O  CBC    Component Value Date/Time   WBC 11.4 (H) 08/21/2018 0229   RBC 4.23 08/21/2018 0229   HGB 12.1 (L) 08/21/2018 0229   HCT 35.7 (L) 08/21/2018 0229   PLT 238 08/21/2018 0229   MCV 84.4 08/21/2018 0229   MCH 28.6 08/21/2018 0229   MCHC 33.9 08/21/2018 0229   RDW 13.3 08/21/2018 0229   LYMPHSABS 3.5 08/20/2018 0345   MONOABS 0.9 08/20/2018 0345   EOSABS 0.1 08/20/2018 0345   BASOSABS 0.0 08/20/2018 0345    BMET    Component Value Date/Time   NA 136 08/21/2018 0549   K 3.8 08/21/2018 0549   CL 100 08/21/2018 0549   CO2 25 08/21/2018 0549   GLUCOSE 179 (H) 08/21/2018 0549   BUN 6 08/21/2018 0549   CREATININE 0.97 08/21/2018 0549   CALCIUM 8.9 08/21/2018 0549   GFRNONAA >60 08/21/2018 0549   GFRAA >60 08/21/2018 0549    INR    Component Value Date/Time   INR 1.08 08/21/2018 0229     Intake/Output Summary (Last 24 hours) at 08/21/2018 0723 Last data filed at 08/21/2018 0038 Gross per 24 hour  Intake 224.2 ml  Output 3720 ml  Net -3495.8 ml     Assessment/Plan:  47 y.o. male is s/p R iliac, femoral, popliteal, and tibial thromboembolectomy  3 Days Post-Op    Palpable R DP R groin incision unremarkable; minimal JP output; d/c JP drain today Continue heparin Dispo: pending further cardiac work up   Emilie RutterMatthew Eveland, PA-C [facility] [phone] 08/21/2018 7:23 AM  I have seen and evaluated the patient. I agree with the PA note as documented above. Palpable right  DP pulse.  Drain minimal output in groin and will remove today.  Continue heparin gtt.  Doing well overall.  Groin incision c/d/i.  Cephus Shellinghristopher J. Treniyah Lynn, MD [facility] [place] Office: [phone] Pager: [phone]

## 2018-08-22 LAB — CBC
HEMATOCRIT: 38.1 % — AB (ref 39.0–52.0)
Hemoglobin: 12.8 g/dL — ABNORMAL LOW (ref 13.0–17.0)
MCH: 28.1 pg (ref 26.0–34.0)
MCHC: 33.6 g/dL (ref 30.0–36.0)
MCV: 83.7 fL (ref 80.0–100.0)
Platelets: 361 10*3/uL (ref 150–400)
RBC: 4.55 MIL/uL (ref 4.22–5.81)
RDW: 13.2 % (ref 11.5–15.5)
WBC: 21.4 10*3/uL — AB (ref 4.0–10.5)
nRBC: 0 % (ref 0.0–0.2)

## 2018-08-22 LAB — HEMOGLOBIN A1C
HEMOGLOBIN A1C: 10.3 % — AB (ref 4.8–5.6)
MEAN PLASMA GLUCOSE: 249 mg/dL

## 2018-08-22 LAB — GLUCOSE, CAPILLARY
GLUCOSE-CAPILLARY: 243 mg/dL — AB (ref 70–99)
Glucose-Capillary: 211 mg/dL — ABNORMAL HIGH (ref 70–99)
Glucose-Capillary: 215 mg/dL — ABNORMAL HIGH (ref 70–99)
Glucose-Capillary: 239 mg/dL — ABNORMAL HIGH (ref 70–99)

## 2018-08-22 LAB — PROTIME-INR
INR: 1.06
Prothrombin Time: 13.7 seconds (ref 11.4–15.2)

## 2018-08-22 LAB — PROTEIN C, TOTAL: Protein C, Total: 78 % (ref 60–150)

## 2018-08-22 LAB — HEPARIN LEVEL (UNFRACTIONATED): Heparin Unfractionated: 0.58 IU/mL (ref 0.30–0.70)

## 2018-08-22 MED ORDER — INSULIN GLARGINE 100 UNIT/ML ~~LOC~~ SOLN
40.0000 [IU] | Freq: Every day | SUBCUTANEOUS | Status: DC
  Administered 2018-08-22 – 2018-08-23 (×2): 40 [IU] via SUBCUTANEOUS
  Filled 2018-08-22 (×2): qty 0.4

## 2018-08-22 NOTE — Progress Notes (Addendum)
  Progress Note    08/22/2018 9:01 AM 1 Day Post-Op  Subjective:  No complaints RLE   Vitals:   08/21/18 1940 08/22/18 0400  BP: 112/68 137/80  Pulse: 83 76  Resp: (!) 25 (!) 22  Temp: 98 F (36.7 C) 98.2 F (36.8 C)  SpO2: 97% 94%   Physical Exam: Lungs:  Non labored Incisions:  R groin incision soft, no drainage Extremities:  Palpable R DP Neurologic: A&O  CBC    Component Value Date/Time   WBC 21.4 (H) 08/22/2018 0315   RBC 4.55 08/22/2018 0315   HGB 12.8 (L) 08/22/2018 0315   HCT 38.1 (L) 08/22/2018 0315   PLT 361 08/22/2018 0315   MCV 83.7 08/22/2018 0315   MCH 28.1 08/22/2018 0315   MCHC 33.6 08/22/2018 0315   RDW 13.2 08/22/2018 0315   LYMPHSABS 3.5 08/20/2018 0345   MONOABS 0.9 08/20/2018 0345   EOSABS 0.1 08/20/2018 0345   BASOSABS 0.0 08/20/2018 0345    BMET    Component Value Date/Time   NA 136 08/21/2018 0549   K 3.8 08/21/2018 0549   CL 100 08/21/2018 0549   CO2 25 08/21/2018 0549   GLUCOSE 179 (H) 08/21/2018 0549   BUN 6 08/21/2018 0549   CREATININE 0.97 08/21/2018 0549   CALCIUM 8.9 08/21/2018 0549   GFRNONAA >60 08/21/2018 0549   GFRAA >60 08/21/2018 0549    INR    Component Value Date/Time   INR 1.06 08/22/2018 0315     Intake/Output Summary (Last 24 hours) at 08/22/2018 0901 Last data filed at 08/22/2018 0612 Gross per 24 hour  Intake 1078.85 ml  Output 2710 ml  Net -1631.15 ml     Assessment/Plan:  47 y.o. male is s/p R iliac, femoral, popliteal, and tibial thromboembolectomy 4 Days Post-Op   Maintains palpable R DP R groin incision unremarkable Cardiac workup ongoing Ok to initiate oral anticoagulation from vascular standpoint We will continue to follow   Emilie RutterMatthew Eveland, PA-C [facility] [phone] 08/22/2018 9:01 AM   I agree with the above.  I have seen and evaluated the patient.  He has a palpable right dorsalis pedis pulse.  His groin incision remains intact.  Awaiting final  decision regarding LV thrombus.  Continue anticoagulation.  Durene CalWells

## 2018-08-22 NOTE — Progress Notes (Signed)
ANTICOAGULATION CONSULT NOTE - Follow Up Consult  Pharmacy Consult for Heparin Indication: pulmonary embolus, DVT and LV thrombus  Allergies  Allergen Reactions  . Contrast Media [Iodinated Diagnostic Agents] Itching    Pt states he thinks it makes him itch  . Lovenox [Enoxaparin Sodium] Itching    Itching over entire body  . Percocet [Oxycodone-Acetaminophen] Hives and Itching    Patient Measurements: Height: 5\' 11"  (180.3 cm) Weight: (!) 309 lb 6.4 oz (140.3 kg) IBW/kg (Calculated) : 75.3 Heparin Dosing Weight: 110 kg  Vital Signs: Temp: 98.1 F (36.7 C) (12/25 0916) Temp Source: Oral (12/25 0916) BP: 126/69 (12/25 0916) Pulse Rate: 86 (12/25 0916)  Labs: Recent Labs    08/20/18 0345 08/21/18 0229 08/21/18 0549 08/21/18 2201 08/22/18 0315  HGB 11.8* 12.1*  --   --  12.8*  HCT 35.3* 35.7*  --   --  38.1*  PLT 253 238  --   --  361  LABPROT 13.8 13.9  --   --  13.7  INR 1.07 1.08  --   --  1.06  HEPARINUNFRC 0.44 0.25*  --  0.50 0.58  CREATININE 1.02  --  0.97  --   --     Estimated Creatinine Clearance: 136.3 mL/min (by C-G formula based on SCr of 0.97 mg/dL).  Assessment:  47 y/o M with recent history of DVT and PE, he had been on Xarelto until last month, unable to obtain due to financial reasons, presented to the ED with chest pain on 12/20. CT angio with PE and LV thrombus, and venous duplex with RLE DVT/ischemic leg. S/p urgent embolectomy on 12/21. Right leg drain out 12/24.  S/p cardiac cath on 12/24.     Heparin resumed post-cath on 12/24 at 2400 units/hr.  Heparin level therapeutic last night (0.50) and remains therapeutic today (0.58).  TCTS consulted, continuing heparin for now. Hypercoag panel sent 12/22, Factor V Leiden and Prothrombin gene mutation in process.  Goal of Therapy:  Heparin level 0.3-0.7 units/ml Monitor platelets by anticoagulation protocol: Yes   Plan:   Continue heparin drip at 2400 units/hr  Daily heparin level and CBC.  Follow  up hypercoag studies, treatment plans and long-term anticoagulation plans.  Dennie FettersEgan, Jalynn Waddell Donovan, RPh Pager: [phone] or phone: [phone] 08/22/2018,10:14 AM

## 2018-08-22 NOTE — Progress Notes (Signed)
PROGRESS NOTE    Marcus Joseph.  WUJ:[id number] DOB: [birth year] DOA: 08/17/2018 PCP: [facility]   Brief Narrative: Patient is a 47 year old male with past medical history of insulin-dependent diabetes mellitus, right leg DVT, recurrent PE we stopped anticoagulation prematurely due to financial condition who presented to the emergency department yesterday night with complaints of acute onset of substernal chest pain, shortness of breath and right lower extremity numbness.  Patient was found to have small non-occlusive right upper lobe PE without right heart strain but also large left liver lesion ventricular thrombus.  Patient started on heparin drip.  Cardiology consulted.  Given the size of left ventricular thrombus we also consulted cardiothoracic surgery who recommended to do TEE. He was found to have decreased  arterial flow on his left lower extremity very concerning for right lower extremity thromboembolism.  There was no flow in SFA/popliteal/tibials on duplex. Consulted vascular surgery and he is underwent emergent vascular intervention/thrombectomy on 08/18/18.Underwent Cardiac MRI and left heart catheterization.  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  No chest pain, troponins negative.  Cardiology, cardiothoracic surgery following.  Continue heparin drip.  Follow up hypercoagulable workup. Cardiac MRI showed very large mobile thrombus 6 cm x 1.5 cm.  Moderate left ventricular enlargement with septal, apical and distal anterior wall hypokinesis.  Ejection fraction of 38%. Waiting  from  recommendation from cardiology/cardiothoracic surgery for oral anticoagulation.  Coronary artery disease: Left heart catheterization today showed CAD with totally occluded mid LAD lesion and high grade lesion in diagonal ostium.Mild diffuse CAD.  Right lower extremity thromboembolism: Presented with right lower extremity numbness and tingling.  Found to have no flow in SFA/popliteal/tibials on duplex.   Underwent emergent thromboembolectomy by vascular surgery.  Continue heparin drip.  S/P removal of drain on his right thigh.  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  He was on Xarelto but he stopped taking that due to financial conditions.  Presented at this time with shortness of breath and chest pain.  Found to have small nonocclusive PE involving right upper lobe without CT evidence of heart strain.  Currently on heparin drip.  Due to presence of left ventricular thrombus, he might need to be put on Coumadin.  Currently hemodynamically stable.  Denies any chest pain.  Insulin-dependent diabetes mellitus: Hemoglobin was 11.2% in September 2019.  On Lantus and NovoLog at home.  Continue monitoring his blood sugars.  Continue Lantus and sliding his insulin.  Possible community-acquired pneumonia: Reports recent sore throat and rhinorrhea, bloody cough.Much improved now/  No fever or chills.  CT findings history of atypical pneumonia.  Antibiotics changed to oral .  Respiratory status stable.  Morbid obesity: Emphasized on the importance of balanced diet and exercise when he is discharged from the hospital.  Leukocytosis: Most likely reactive.  Will check CBC tomorrow         DVT prophylaxis: Heparin Code Status: Full Family Communication: No Family member present at the bedside Disposition Plan: Home after pain is from cardiology, cardiothoracic surgery, vascular surgery   Consultants: Cardiology, cardiothoracic surgery, vascular  surgery  Procedures:  Thromboembolectomy, cardiac MRI, left heart catheterization  Antimicrobials: Levaquin day 4/6  Subjective: Patient seen and examined the bedside this morning.  Remains comfortable.  No new issues Objective: Vitals:   08/21/18 1400 08/21/18 1940 08/22/18 0400 08/22/18 0916  BP: (!) 143/98 112/68 137/80 126/69  Pulse: 74 83 76 86  Resp: (!) 24 (!) 25 (!) 22 18  Temp:  98 F (36.7 C) 98.2 F (36.8 C) 98.1 F (36.7 C)  TempSrc:  Oral Oral Oral  SpO2: 97% 97% 94% 97%  Weight:      Height:        Intake/Output Summary (Last 24 hours) at 08/22/2018 1341 Last data filed at 08/22/2018 0840 Gross per 24 hour  Intake 1078.85 ml  Output 2000 ml  Net -921.15 ml   Filed Weights   08/21/18 0037  Weight: (!) 140.3 kg    Examination:  General exam: Appears calm and comfortable ,Not in distress,obese HEENT:PERRL,Oral mucosa moist, Ear/Nose normal on gross exam Respiratory system: Bilateral equal air entry, normal vesicular breath sounds, no wheezes or crackles  Cardiovascular system: S1 & S2 heard, RRR. No JVD, murmurs, rubs, gallops or clicks. No pedal edema. Gastrointestinal system: Abdomen is nondistended, soft and nontender. No organomegaly or masses felt. Normal bowel sounds heard. Central nervous system: Alert and oriented. No focal neurological deficits. Extremities: No edema, no clubbing ,no cyanosis, distal peripheral pulses palpable.Drain on the right thigh,very low output Skin: No rashes, lesions or ulcers,no icterus ,no pallor MSK: Normal muscle bulk,tone ,power Psychiatry: Judgement and insight appear normal. Mood & affect appropriate.     Data Reviewed: I have personally reviewed following labs and imaging studies  CBC: Recent Labs  Lab 08/17/18 1925 08/19/18 0708 08/20/18 0345 08/21/18 0229 08/22/18 0315  WBC 13.8* 11.1* 11.1* 11.4* 21.4*  NEUTROABS  --  8.3* 6.6  --   --   HGB 15.5 11.3* 11.8* 12.1* 12.8*  HCT 46.7 35.7* 35.3* 35.7*  38.1*  MCV 85.1 86.4 84.2 84.4 83.7  PLT 408* 267 253 238 361   Basic Metabolic Panel: Recent Labs  Lab 08/17/18 1925 08/18/18 0643 08/20/18 0345 08/21/18 0549  NA 137 136 137 136  K 3.6 3.8 3.4* 3.8  CL 98 100 101 100  CO2 20* 22 26 25   GLUCOSE 280* 266* 181* 179*  BUN 8 7 6 6   CREATININE 1.20 1.02 1.02 0.97  CALCIUM 8.8* 8.4* 8.1* 8.9   GFR: Estimated Creatinine Clearance: 136.3 mL/min (by C-G formula based on SCr of 0.97 mg/dL). Liver Function Tests: No results for input(s): AST, ALT, ALKPHOS, BILITOT, PROT, ALBUMIN in the last 168 hours. No results for input(s): LIPASE, AMYLASE in the last 168 hours. No results for input(s): AMMONIA in the last 168 hours. Coagulation Profile: Recent Labs  Lab 08/19/18 0422 08/20/18 0345 08/21/18 0229 08/22/18 0315  INR 1.08 1.07 1.08 1.06   Cardiac Enzymes: Recent Labs  Lab 08/18/18 1808  TROPONINI 0.04*   BNP (last 3 results) No results for input(s): PROBNP in the last 8760 hours. HbA1C: Recent Labs    08/21/18 0229  HGBA1C 10.3*   CBG: Recent Labs  Lab 08/21/18 1117 08/21/18 1640 08/21/18 2134 08/22/18 0611 08/22/18 1148  GLUCAP 322* 338* 258* 211* 215*   Lipid Profile: No results for input(s): CHOL, HDL, LDLCALC, TRIG, CHOLHDL, LDLDIRECT in the last 72 hours. Thyroid Function Tests: No results for input(s): TSH, T4TOTAL, FREET4, T3FREE, THYROIDAB in the last 72 hours. Anemia Panel: No results for input(s): VITAMINB12, FOLATE, FERRITIN, TIBC,  IRON, RETICCTPCT in the last 72 hours. Sepsis Labs: No results for input(s): PROCALCITON, LATICACIDVEN in the last 168 hours.  Recent Results (from the past 240 hour(s))  Culture, sputum-assessment     Status: None   Collection Time: 08/18/18 10:19 PM  Result Value Ref Range Status   Specimen Description EXPECTORATED SPUTUM  Final   Special Requests Normal  Final   Sputum evaluation   Final    THIS SPECIMEN IS ACCEPTABLE FOR SPUTUM CULTURE Performed at [facility], [address], [place], [place] [address]    Report Status 08/19/2018 FINAL  Final  Culture, respiratory     Status: None   Collection Time: 08/18/18 10:19 PM  Result Value Ref Range Status   Specimen Description EXPECTORATED SPUTUM  Final   Special Requests Normal Reflexed from [id number]  Final   Gram Stain   Final    RARE WBC PRESENT, PREDOMINANTLY PMN RARE GRAM POSITIVE RODS Performed at [facility], [address] [place], [place] [address]    Culture FEW Consistent with normal respiratory flora.  Final   Report Status 08/21/2018 FINAL  Final         Radiology Studies: No results found.      Scheduled Meds: . insulin aspart  0-20 Units Subcutaneous TID WC  . insulin aspart  0-5 Units Subcutaneous QHS  . insulin glargine  40 Units Subcutaneous QHS  . levofloxacin  750 mg Oral Daily  . sodium chloride flush  3 mL Intravenous Q12H  . sodium chloride flush  3 mL Intravenous Q12H   Continuous Infusions: . sodium chloride 10 mL/hr at 08/21/18 1500  . heparin 2,400 Units/hr (08/22/18 1115)     LOS: 3 days    Time spent: 35 mins.More than 50% of that time was spent in counseling and/or coordination of care.      Burnadette Pop, MD [facility] Pager [phone]  If 7PM-7AM, please contact night-coverage [email or web address] Password [email or web address] 08/22/2018, 1:41 PM

## 2018-08-23 ENCOUNTER — Inpatient Hospital Stay (HOSPITAL_COMMUNITY): Payer: Self-pay

## 2018-08-23 DIAGNOSIS — R0602 Shortness of breath: Secondary | ICD-10-CM

## 2018-08-23 DIAGNOSIS — E1165 Type 2 diabetes mellitus with hyperglycemia: Secondary | ICD-10-CM

## 2018-08-23 DIAGNOSIS — Z794 Long term (current) use of insulin: Secondary | ICD-10-CM

## 2018-08-23 LAB — CBC
HCT: 38 % — ABNORMAL LOW (ref 39.0–52.0)
Hemoglobin: 12.2 g/dL — ABNORMAL LOW (ref 13.0–17.0)
MCH: 27.1 pg (ref 26.0–34.0)
MCHC: 32.1 g/dL (ref 30.0–36.0)
MCV: 84.4 fL (ref 80.0–100.0)
PLATELETS: 350 10*3/uL (ref 150–400)
RBC: 4.5 MIL/uL (ref 4.22–5.81)
RDW: 13.3 % (ref 11.5–15.5)
WBC: 12.5 10*3/uL — ABNORMAL HIGH (ref 4.0–10.5)
nRBC: 0 % (ref 0.0–0.2)

## 2018-08-23 LAB — COMPREHENSIVE METABOLIC PANEL WITH GFR
ALT: 22 U/L (ref 0–44)
AST: 17 U/L (ref 15–41)
Albumin: 2.8 g/dL — ABNORMAL LOW (ref 3.5–5.0)
Alkaline Phosphatase: 41 U/L (ref 38–126)
Anion gap: 10 (ref 5–15)
BUN: 12 mg/dL (ref 6–20)
CO2: 26 mmol/L (ref 22–32)
Calcium: 8.6 mg/dL — ABNORMAL LOW (ref 8.9–10.3)
Chloride: 100 mmol/L (ref 98–111)
Creatinine, Ser: 1.04 mg/dL (ref 0.61–1.24)
GFR calc Af Amer: >60 mL/min
GFR calc non Af Amer: >60 mL/min
Glucose, Bld: 211 mg/dL — ABNORMAL HIGH (ref 70–99)
Potassium: 3.9 mmol/L (ref 3.5–5.1)
Sodium: 136 mmol/L (ref 135–145)
Total Bilirubin: 0.3 mg/dL (ref 0.3–1.2)
Total Protein: 6.6 g/dL (ref 6.5–8.1)

## 2018-08-23 LAB — URINALYSIS, ROUTINE W REFLEX MICROSCOPIC
Bilirubin Urine: NEGATIVE
Glucose, UA: NEGATIVE mg/dL
Hgb urine dipstick: NEGATIVE
Ketones, ur: NEGATIVE mg/dL
Leukocytes, UA: NEGATIVE
Nitrite: NEGATIVE
Protein, ur: NEGATIVE mg/dL
Specific Gravity, Urine: 1.006 (ref 1.005–1.030)
pH: 6 (ref 5.0–8.0)

## 2018-08-23 LAB — GLUCOSE, CAPILLARY
Glucose-Capillary: 174 mg/dL — ABNORMAL HIGH (ref 70–99)
Glucose-Capillary: 209 mg/dL — ABNORMAL HIGH (ref 70–99)
Glucose-Capillary: 189 mg/dL — ABNORMAL HIGH (ref 70–99)
Glucose-Capillary: 183 mg/dL — ABNORMAL HIGH (ref 70–99)

## 2018-08-23 LAB — PROTIME-INR
INR: 1.02
Prothrombin Time: 13.3 s (ref 11.4–15.2)

## 2018-08-23 LAB — PROTHROMBIN GENE MUTATION

## 2018-08-23 LAB — FACTOR 5 LEIDEN

## 2018-08-23 LAB — HEPARIN LEVEL (UNFRACTIONATED)
HEPARIN UNFRACTIONATED: 0.38 [IU]/mL (ref 0.30–0.70)
Heparin Unfractionated: >2.2 IU/mL — ABNORMAL HIGH (ref 0.30–0.70)

## 2018-08-23 LAB — HEMOGLOBIN A1C
Hgb A1c MFr Bld: 9.6 % — ABNORMAL HIGH (ref 4.8–5.6)
Mean Plasma Glucose: 228.82 mg/dL

## 2018-08-23 LAB — APTT: aPTT: 71 s — ABNORMAL HIGH (ref 24–36)

## 2018-08-23 LAB — ECHOCARDIOGRAM LIMITED
Height: 71 in
Weight: 4950.4 oz

## 2018-08-23 MED ORDER — MAGNESIUM SULFATE 50 % IJ SOLN
40.0000 meq | INTRAMUSCULAR | Status: DC
  Filled 2018-08-23: qty 9.85

## 2018-08-23 MED ORDER — NITROGLYCERIN IN D5W 200-5 MCG/ML-% IV SOLN
2.0000 ug/min | INTRAVENOUS | Status: AC
  Administered 2018-08-24: 16.6 ug/min via INTRAVENOUS
  Filled 2018-08-23 (×2): qty 250

## 2018-08-23 MED ORDER — SODIUM CHLORIDE 0.9 % IV SOLN
750.0000 mg | INTRAVENOUS | Status: DC
  Filled 2018-08-23: qty 750

## 2018-08-23 MED ORDER — INSULIN REGULAR(HUMAN) IN NACL 100-0.9 UT/100ML-% IV SOLN
INTRAVENOUS | Status: DC
  Filled 2018-08-23: qty 100

## 2018-08-23 MED ORDER — VANCOMYCIN HCL 10 G IV SOLR
1500.0000 mg | INTRAVENOUS | Status: AC
  Administered 2018-08-24: 1500 mg via INTRAVENOUS
  Filled 2018-08-23: qty 1500

## 2018-08-23 MED ORDER — PLASMA-LYTE 148 IV SOLN
INTRAVENOUS | Status: AC
  Administered 2018-08-24: 500 mL
  Filled 2018-08-23: qty 2.5

## 2018-08-23 MED ORDER — CHLORHEXIDINE GLUCONATE 0.12 % MT SOLN
15.0000 mL | Freq: Once | OROMUCOSAL | Status: AC
  Administered 2018-08-24: 15 mL via OROMUCOSAL
  Filled 2018-08-23: qty 15

## 2018-08-23 MED ORDER — SODIUM CHLORIDE 0.9 % IV SOLN
1.5000 g | INTRAVENOUS | Status: AC
  Administered 2018-08-24: 1.5 g via INTRAVENOUS
  Filled 2018-08-23: qty 1.5

## 2018-08-23 MED ORDER — INSULIN STARTER KIT- PEN NEEDLES (ENGLISH)
1.0000 | Freq: Once | Status: DC
  Filled 2018-08-23: qty 1

## 2018-08-23 MED ORDER — METOPROLOL TARTRATE 12.5 MG HALF TABLET
12.5000 mg | ORAL_TABLET | Freq: Once | ORAL | Status: AC
  Administered 2018-08-24: 12.5 mg via ORAL
  Filled 2018-08-23: qty 1

## 2018-08-23 MED ORDER — HEPARIN (PORCINE) 25000 UT/250ML-% IV SOLN
2200.0000 [IU]/h | INTRAVENOUS | Status: DC
  Administered 2018-08-23: 2100 [IU]/h via INTRAVENOUS
  Administered 2018-08-23: 2200 [IU]/h via INTRAVENOUS
  Filled 2018-08-23 (×2): qty 250

## 2018-08-23 MED ORDER — TRANEXAMIC ACID (OHS) PUMP PRIME SOLUTION
2.0000 mg/kg | INTRAVENOUS | Status: DC
  Filled 2018-08-23: qty 2.81

## 2018-08-23 MED ORDER — DOPAMINE-DEXTROSE 3.2-5 MG/ML-% IV SOLN
0.0000 ug/kg/min | INTRAVENOUS | Status: AC
  Administered 2018-08-24: 3 ug/kg/min via INTRAVENOUS
  Filled 2018-08-23: qty 250

## 2018-08-23 MED ORDER — CHLORHEXIDINE GLUCONATE CLOTH 2 % EX PADS
6.0000 | MEDICATED_PAD | Freq: Once | CUTANEOUS | Status: AC
  Administered 2018-08-23: 6 via TOPICAL

## 2018-08-23 MED ORDER — MILRINONE LACTATE IN DEXTROSE 20-5 MG/100ML-% IV SOLN
0.3000 ug/kg/min | INTRAVENOUS | Status: AC
  Administered 2018-08-24: 5 ug/kg/min via INTRAVENOUS
  Filled 2018-08-23: qty 100

## 2018-08-23 MED ORDER — TEMAZEPAM 7.5 MG PO CAPS: 15.0000 mg | ORAL_CAPSULE | Freq: Once | ORAL | Status: DC | PRN

## 2018-08-23 MED ORDER — POTASSIUM CHLORIDE 2 MEQ/ML IV SOLN
80.0000 meq | INTRAVENOUS | Status: DC
  Filled 2018-08-23: qty 40

## 2018-08-23 MED ORDER — VANCOMYCIN HCL 10 G IV SOLR
1250.0000 mg | INTRAVENOUS | Status: DC
  Filled 2018-08-23: qty 1250

## 2018-08-23 MED ORDER — TRANEXAMIC ACID 1000 MG/10ML IV SOLN
1.5000 mg/kg/h | INTRAVENOUS | Status: AC
  Administered 2018-08-24: 1.5 mg/kg/h via INTRAVENOUS
  Administered 2018-08-24: 13:00:00 via INTRAVENOUS
  Filled 2018-08-23: qty 25

## 2018-08-23 MED ORDER — INSULIN ASPART 100 UNIT/ML ~~LOC~~ SOLN
3.0000 [IU] | Freq: Three times a day (TID) | SUBCUTANEOUS | Status: DC
  Administered 2018-08-23: 3 [IU] via SUBCUTANEOUS

## 2018-08-23 MED ORDER — BISACODYL 5 MG PO TBEC
5.0000 mg | DELAYED_RELEASE_TABLET | Freq: Once | ORAL | Status: AC
  Administered 2018-08-23: 5 mg via ORAL
  Filled 2018-08-23: qty 1

## 2018-08-23 MED ORDER — EPINEPHRINE PF 1 MG/ML IJ SOLN
0.0000 ug/min | INTRAVENOUS | Status: DC
  Filled 2018-08-23: qty 4

## 2018-08-23 MED ORDER — LIVING WELL WITH DIABETES BOOK
Freq: Once | Status: DC
  Filled 2018-08-23: qty 1

## 2018-08-23 MED ORDER — TRANEXAMIC ACID (OHS) BOLUS VIA INFUSION
15.0000 mg/kg | INTRAVENOUS | Status: AC
  Administered 2018-08-24: 2104.5 mg via INTRAVENOUS
  Filled 2018-08-23: qty 2105

## 2018-08-23 MED ORDER — DEXMEDETOMIDINE HCL IN NACL 400 MCG/100ML IV SOLN
0.1000 ug/kg/h | INTRAVENOUS | Status: DC
  Filled 2018-08-23: qty 100

## 2018-08-23 MED ORDER — PHENYLEPHRINE HCL-NACL 20-0.9 MG/250ML-% IV SOLN
30.0000 ug/min | INTRAVENOUS | Status: DC
  Filled 2018-08-23: qty 250

## 2018-08-23 MED ORDER — SODIUM CHLORIDE 0.9 % IV SOLN
INTRAVENOUS | Status: DC
  Filled 2018-08-23: qty 30

## 2018-08-23 MED FILL — Lidocaine HCl Local Preservative Free (PF) Inj 1%: INTRAMUSCULAR | Qty: 30 | Status: AC

## 2018-08-23 NOTE — Progress Notes (Signed)
ANTICOAGULATION CONSULT NOTE - Follow Up Consult  Pharmacy Consult for Heparin Indication: pulmonary embolus, DVT and LV thrombus  Allergies  Allergen Reactions  . Contrast Media [Iodinated Diagnostic Agents] Itching    Pt states he thinks it makes him itch  . Lovenox [Enoxaparin Sodium] Itching    Itching over entire body  . Percocet [Oxycodone-Acetaminophen] Hives and Itching    Patient Measurements: Height: 5\' 11"  (180.3 cm) Weight: (!) 309 lb 6.4 oz (140.3 kg) IBW/kg (Calculated) : 75.3 Heparin Dosing Weight: 110 kg  Vital Signs: Temp: 98.3 F (36.8 C) (12/26 0412) Temp Source: Oral (12/26 0412) BP: 127/70 (12/26 0412) Pulse Rate: 66 (12/26 0412)  Labs: Recent Labs    08/21/18 0229 08/21/18 0549 08/21/18 2201 08/22/18 0315 08/23/18 0310  HGB 12.1*  --   --  12.8* 12.2*  HCT 35.7*  --   --  38.1* 38.0*  PLT 238  --   --  361 350  LABPROT 13.9  --   --  13.7  --   INR 1.08  --   --  1.06  --   HEPARINUNFRC 0.25*  --  0.50 0.58 >2.20*  CREATININE  --  0.97  --   --   --     Estimated Creatinine Clearance: 136.3 mL/min (by C-G formula based on SCr of 0.97 mg/dL).  Assessment:  47 y/o M with recent history of DVT and PE, he had been on Xarelto until last month, unable to obtain due to financial reasons, presented to the ED with chest pain on 12/20. CT angio with PE and LV thrombus, and venous duplex with RLE DVT/ischemic leg. S/p urgent embolectomy on 12/21. Right leg drain out 12/24.  S/p cardiac cath on 12/24. Heparin level this am <2.2 units/ml.  RN reports was drawn from opposite arm infusing in    Goal of Therapy:  Heparin level 0.3-0.7 units/ml Monitor platelets by anticoagulation protocol: Yes   Plan:   Hold heparin for 1 hours.  Restart at 2100 units/hr Check heparin level in 6 hours  Daily heparin level and CBC.  Follow up hypercoag studies, treatment plans and long-term anticoagulation plans.  Talbert CageLora Tomisha Reppucci, PharmD 08/23/2018,6:07 AM

## 2018-08-23 NOTE — Progress Notes (Signed)
  Progress Note    08/23/2018 7:32 AM 3 Days Post-Op   Subjective:  Right foot feels good.   Vitals:   08/22/18 2000 08/23/18 0412  BP: 135/73 127/70  Pulse:  66  Resp: (!) 24 14  Temp: 98.7 F (37.1 C) 98.3 F (36.8 C)  SpO2: 96% 95%   Physical Exam: Incisions:  R groin incision c/d/i; Palpable R DP; soft R calf Abdomen:  Soft Neurologic: A&O  CBC    Component Value Date/Time   WBC 12.5 (H) 08/23/2018 0310   RBC 4.50 08/23/2018 0310   HGB 12.2 (L) 08/23/2018 0310   HCT 38.0 (L) 08/23/2018 0310   PLT 350 08/23/2018 0310   MCV 84.4 08/23/2018 0310   MCH 27.1 08/23/2018 0310   MCHC 32.1 08/23/2018 0310   RDW 13.3 08/23/2018 0310   LYMPHSABS 3.5 08/20/2018 0345   MONOABS 0.9 08/20/2018 0345   EOSABS 0.1 08/20/2018 0345   BASOSABS 0.0 08/20/2018 0345    BMET    Component Value Date/Time   NA 136 08/21/2018 0549   K 3.8 08/21/2018 0549   CL 100 08/21/2018 0549   CO2 25 08/21/2018 0549   GLUCOSE 179 (H) 08/21/2018 0549   BUN 6 08/21/2018 0549   CREATININE 0.97 08/21/2018 0549   CALCIUM 8.9 08/21/2018 0549   GFRNONAA >60 08/21/2018 0549   GFRAA >60 08/21/2018 0549    INR    Component Value Date/Time   INR 1.06 08/22/2018 0315     Intake/Output Summary (Last 24 hours) at 08/23/2018 0732 Last data filed at 08/23/2018 0141 Gross per 24 hour  Intake 720 ml  Output 400 ml  Net 320 ml     Assessment/Plan:  47 y.o. male is s/p R iliac, femoral, popliteal, and tibial thromboembolectomy    Palpable right DP pulse.  Drain out.  Right groin looks good.  Continue heparin gtt and transition to PO anticoagulation per primary team.  Doing well overall.    Cephus Shellinghristopher J. , MD [facility] [place] Office: [phone] Pager: [phone]  Cephus Shellinghristopher J

## 2018-08-23 NOTE — Progress Notes (Signed)
DAILY PROGRESS NOTE   Patient Name: Marcus SpillerWilliam L Hampshire Jr. Date of Encounter: 08/23/2018  Chief Complaint   No chest or leg pain  Patient Profile   Marcus QuanWilliam L Mckamey [name] is a 47 y.o. male with a hx of recurrent DVT/PE, DM2, GERD, arthritis who is being seen today for the evaluation of LV thrombus at the request of Dr. Elesa MassedWard.  Subjective   Cath on 12/24 reviewed -there is a CTO of the mid LAD and high grade ostial diagonal lesion. Findings were discussed between Dr. Gala RomneyBensimhon and Dr. Tyrone SageGerhardt - appreciate Dr. Dennie MaizesGerhardt's recommendations. It does not seem that he is leaning toward surgery. Marcus Joseph remains on heparin.  Objective   Vitals:   08/22/18 0916 08/22/18 2000 08/23/18 0412 08/23/18 0837  BP: 126/69 135/73 127/70 133/68  Pulse: 86  66 75  Resp: 18 (!) 24 14 17   Temp: 98.1 F (36.7 C) 98.7 F (37.1 C) 98.3 F (36.8 C) 98.3 F (36.8 C)  TempSrc: Oral Oral Oral Oral  SpO2: 97% 96% 95% 99%  Weight:      Height:        Intake/Output Summary (Last 24 hours) at 08/23/2018 0840 Last data filed at 08/23/2018 0736 Gross per 24 hour  Intake 480 ml  Output 700 ml  Net -220 ml   Filed Weights   08/21/18 0037  Weight: (!) 140.3 kg    Physical Exam   General appearance: alert and no distress Lungs: clear to auscultation bilaterally Heart: regular rate and rhythm, S1, S2 normal, no murmur, click, rub or gallop Extremities: extremities normal, atraumatic, no cyanosis or edema Neurologic: Grossly normal  Inpatient Medications    Scheduled Meds: . insulin aspart  0-20 Units Subcutaneous TID WC  . insulin aspart  0-5 Units Subcutaneous QHS  . insulin glargine  40 Units Subcutaneous QHS  . levofloxacin  750 mg Oral Daily  . sodium chloride flush  3 mL Intravenous Q12H  . sodium chloride flush  3 mL Intravenous Q12H    Continuous Infusions: . sodium chloride 10 mL/hr at 08/21/18 1500  . heparin 2,100 Units/hr (08/23/18 0714)    PRN Meds: sodium chloride,  acetaminophen, alum & mag hydroxide-simeth, diphenhydrAMINE, fentaNYL (SUBLIMAZE) injection, Influenza vac split quadrivalent PF, ondansetron (ZOFRAN) IV, ondansetron **OR** [DISCONTINUED] ondansetron (ZOFRAN) IV, oxyCODONE-acetaminophen, pneumococcal 23 valent vaccine, senna-docusate, sodium chloride flush   Labs   Results for orders placed or performed during the hospital encounter of 08/17/18 (from the past 48 hour(s))  Glucose, capillary     Status: Abnormal   Collection Time: 08/21/18 11:17 AM  Result Value Ref Range   Glucose-Capillary 322 (H) 70 - 99 mg/dL   Comment 1 Notify RN    Comment 2 Document in Chart   Glucose, capillary     Status: Abnormal   Collection Time: 08/21/18  4:40 PM  Result Value Ref Range   Glucose-Capillary 338 (H) 70 - 99 mg/dL   Comment 1 Notify RN    Comment 2 Document in Chart   Glucose, capillary     Status: Abnormal   Collection Time: 08/21/18  9:34 PM  Result Value Ref Range   Glucose-Capillary 258 (H) 70 - 99 mg/dL   Comment 1 Notify RN    Comment 2 Document in Chart   Heparin level (unfractionated)     Status: None   Collection Time: 08/21/18 10:01 PM  Result Value Ref Range   Heparin Unfractionated 0.50 0.30 - 0.70 IU/mL    Comment: (NOTE)  If heparin results are below expected values, and patient dosage has  been confirmed, suggest follow up testing of antithrombin III levels. Performed at [facility], [address] [place], [place] [address]   Protime-INR     Status: None   Collection Time: 08/22/18  3:15 AM  Result Value Ref Range   Prothrombin Time 13.7 11.4 - 15.2 seconds   INR 1.06     Comment: Performed at [facility], [address] [place], [place] [address]  Heparin level (unfractionated)     Status: None   Collection Time: 08/22/18  3:15 AM  Result Value Ref Range   Heparin Unfractionated 0.58 0.30 - 0.70 IU/mL    Comment: (NOTE) If heparin results are below expected values, and patient dosage has  been  confirmed, suggest follow up testing of antithrombin III levels. Performed at [facility], [address] [place], [place] [address]   CBC     Status: Abnormal   Collection Time: 08/22/18  3:15 AM  Result Value Ref Range   WBC 21.4 (H) 4.0 - 10.5 K/uL   RBC 4.55 4.22 - 5.81 MIL/uL   Hemoglobin 12.8 (L) 13.0 - 17.0 g/dL   HCT 91.438.1 (L) 78.239.0 - 95.652.0 %   MCV 83.7 80.0 - 100.0 fL   MCH 28.1 26.0 - 34.0 pg   MCHC 33.6 30.0 - 36.0 g/dL   RDW 21.313.2 08.611.5 - 57.815.5 %   Platelets 361 150 - 400 K/uL   nRBC 0.0 0.0 - 0.2 %    Comment: Performed at [facility], [address] [place], [place] [address]  Glucose, capillary     Status: Abnormal   Collection Time: 08/22/18  6:11 AM  Result Value Ref Range   Glucose-Capillary 211 (H) 70 - 99 mg/dL   Comment 1 Notify RN    Comment 2 Document in Chart   Glucose, capillary     Status: Abnormal   Collection Time: 08/22/18 11:48 AM  Result Value Ref Range   Glucose-Capillary 215 (H) 70 - 99 mg/dL  Glucose, capillary     Status: Abnormal   Collection Time: 08/22/18  4:43 PM  Result Value Ref Range   Glucose-Capillary 239 (H) 70 - 99 mg/dL  Glucose, capillary     Status: Abnormal   Collection Time: 08/22/18  9:16 PM  Result Value Ref Range   Glucose-Capillary 243 (H) 70 - 99 mg/dL  Heparin level (unfractionated)     Status: Abnormal   Collection Time: 08/23/18  3:10 AM  Result Value Ref Range   Heparin Unfractionated >2.20 (H) 0.30 - 0.70 IU/mL    Comment: RESULTS CONFIRMED BY MANUAL DILUTION Performed at [facility], [address] [place], [place] [address]   CBC     Status: Abnormal   Collection Time: 08/23/18  3:10 AM  Result Value Ref Range   WBC 12.5 (H) 4.0 - 10.5 K/uL   RBC 4.50 4.22 - 5.81 MIL/uL   Hemoglobin 12.2 (L) 13.0 - 17.0 g/dL   HCT 13.238.0 (L) 44.039.0 - 10.252.0 %   MCV 84.4 80.0 - 100.0 fL   MCH 27.1 26.0 - 34.0 pg   MCHC 32.1 30.0 - 36.0 g/dL   RDW 72.513.3 36.611.5 - 44.015.5 %   Platelets 350 150 - 400 K/uL   nRBC 0.0  0.0 - 0.2 %    Comment: Performed at [facility], [address] [place], [place] [address]  Glucose, capillary     Status: Abnormal   Collection Time: 08/23/18  6:14 AM  Result Value Ref Range   Glucose-Capillary 174 (H) 70 - 99 mg/dL    ECG   N/A  Telemetry   Sinus rhythm - Personally Reviewed  Radiology    No results found.  Cardiac Studies   LV EF: 35% -   40%  ------------------------------------------------------------------- History:   PMH:  Left ventricular clot.  Risk factors:  Diabetes mellitus.  ------------------------------------------------------------------- Study Conclusions  - Left ventricle: The cavity size was normal. Wall thickness was   normal. Systolic function was moderately reduced. The estimated   ejection fraction was in the range of 35% to 40%. There is   akinesis of the apical myocardium. Doppler parameters are   consistent with abnormal left ventricular relaxation (grade 1   diastolic dysfunction). There was an apparent, large,   apicalthrombus.  Impressions:  - Akinesis of apex with overall moderate LV dysfunction; mild   diastolic dysfunction; large, mobile density originating from LV   apex extending towards aortic valve consistent with thrombus.  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 Per Dr. Tyrone Sage "Cath reviewed- 100 % LAD , chronic and most likely atherosclerotic  not embolis with collateral flow. With apical anterior hypokenesis not pressing need for cabg. Since large and mobile lv thrombi are relatively rare there are no studies addressed the risk of embolization, there is a seem to be higher  with mobile thrombus than with mural thrombus, there is little guidance in the literature on the best approach is anticoagulation versus surgical." Will touch base with Dr. Tyrone Sage today for definitive plan. Would continue heparin and transition to warfarin. There is little evidence in the literature for NOACs with LV thrombus - it would be considered off-label use. If no surgery is recommended, ?medical therapy for Ostial diag lesion (since he is chest pain free) - versus PCI, which would significantly increase bleeding risk if he is subjected to triple therapy. In the absence of CABG/thrombectomy, I would favor medical therapy unless he has angina.  Time Spent Directly with Patient:  I have spent a total of 25 minutes with the patient reviewing hospital notes, telemetry, EKGs, labs and examining the patient as well as establishing an assessment and plan that was discussed personally with the patient.  > 50% of time was spent in direct patient care.  Length of Stay:  LOS: 4 days   Chrystie Nose, MD, [facility], FACP  [place]  [facility]  Medical Director of the [facility] Lipid Disorders &  Cardiovascular Risk Reduction Clinic Diplomate of the American Board of Clinical Lipidology Attending Cardiologist  Direct Dial: [phone]  Fax: [phone]  Website:  [email or web address]  08/23/2018, 8:40 AM

## 2018-08-23 NOTE — Progress Notes (Signed)
  Echocardiogram 2D Echocardiogram has been performed.  Marcus ChimesWendy  [name] 08/23/2018, 10:51 AM

## 2018-08-23 NOTE — Progress Notes (Signed)
ANTICOAGULATION CONSULT NOTE - Follow Up Consult  Pharmacy Consult for Heparin Indication: pulmonary embolus, DVT and LV thrombus  Allergies  Allergen Reactions  . Contrast Media [Iodinated Diagnostic Agents] Itching    Pt states he thinks it makes him itch  . Lovenox [Enoxaparin Sodium] Itching    Itching over entire body  . Percocet [Oxycodone-Acetaminophen] Hives and Itching    Patient Measurements: Height: 5\' 11"  (180.3 cm) Weight: (!) 309 lb 6.4 oz (140.3 kg) IBW/kg (Calculated) : 75.3 Heparin Dosing Weight: 110 kg  Vital Signs: Temp: 98.5 F (36.9 C) (12/26 1217) Temp Source: Oral (12/26 1217) BP: 135/83 (12/26 1217) Pulse Rate: 83 (12/26 1217)  Labs: Recent Labs    08/21/18 0229 08/21/18 0549  08/22/18 0315 08/23/18 0310 08/23/18 1338  HGB 12.1*  --   --  12.8* 12.2*  --   HCT 35.7*  --   --  38.1* 38.0*  --   PLT 238  --   --  361 350  --   LABPROT 13.9  --   --  13.7  --   --   INR 1.08  --   --  1.06  --   --   HEPARINUNFRC 0.25*  --    < > 0.58 >2.20* 0.38  CREATININE  --  0.97  --   --   --   --    < > = values in this interval not displayed.    Estimated Creatinine Clearance: 136.3 mL/min (by C-G formula based on SCr of 0.97 mg/dL).  Assessment:  47 y/o M with recent history of DVT and PE, he had been on Xarelto until last month, unable to obtain due to financial reasons, presented to the ED with chest pain on 12/20. CT angio with PE and LV thrombus, and venous duplex with RLE DVT/ischemic leg. S/p urgent embolectomy on 12/21. Right leg drain out 12/24.  S/p cardiac cath on 12/24.  Heparin level this PM within therapeutic range    Goal of Therapy:  Heparin level 0.3-0.7 units/ml Monitor platelets by anticoagulation protocol: Yes   Plan:  Increase heparin to 2200 units / hr to prevent drop to < 0.3 Awaiting decision regarding po anti-coagulation  Daily heparin level and CBC. Follow up hypercoag studies, treatment plans and long-term  anticoagulation plans.  Thank you Okey RegalLisa Cambrea Kirt, PharmD [phone] 08/23/2018,2:27 PM

## 2018-08-23 NOTE — Progress Notes (Addendum)
Inpatient Diabetes Program Recommendations  AACE/ADA: New Consensus Statement on Inpatient Glycemic Control (2015)  Target Ranges:  Prepandial:   less than 140 mg/dL      Peak postprandial:   less than 180 mg/dL (1-2 hours)      Critically ill patients:  140 - 180 mg/dL   Lab Results  Component Value Date   GLUCAP 209 (H) 08/23/2018   HGBA1C 10.3 (H) 08/21/2018    Review of Glycemic Control Results for Marcus Joseph, Marcus Joseph (MRN [id number]) as of 08/23/2018 11:26  Ref. Range 08/22/2018 11:48 08/22/2018 16:43 08/22/2018 21:16 08/23/2018 06:14 08/23/2018 11:22  Glucose-Capillary Latest Ref Range: 70 - 99 mg/dL 215 (H) 239 (H) 243 (H) 174 (H) 209 (H)   Diabetes history: DM2 Outpatient Diabetes medications: Lantus 30 units + Novolog 6 units tid meal coverage Current orders for Inpatient glycemic control: Lantus 40 units + Novolog resistant correction tid + hs 0-5.  Inpatient Diabetes Program Recommendations:   -Add Novolog 3 units tid meal coverage if eats 50%  Spoke with pt about A1C results 10.3 (average blood glucose 249 over the past 2-3 months) and explained what an A1C is, basic pathophysiology of DM Type 2, basic home care, basic diabetes diet nutrition principles, importance of checking CBGs and maintaining good CBG control to prevent long-term and short-term complications. Reviewed signs and symptoms of hyperglycemia and hypoglycemia and how to treat hypoglycemia at home. Also reviewed blood sugar goals at home.  RNs to provide ongoing basic DM education at bedside with this patient. Have ordered educational booklet, insulin starter kit, and DM videos.  Patient states that he was on insulin previously, discontinued due to blood sugars improving and has been taking again prior to admission. Reviewed basic nutrition with patient answering questions regarding food and drinks. Patient states he drinks juice some and otherwise diet drinks. Patient agrees to decrease juice, drink only  diet drinks, and decrease carbohydrates. Patient has a meter and strips and reviewed to check CBGs and review with PCP. Patient is waiting for medicaid to be approved. If patient goes home without medicaid, consider Novolin 70/30 insulin pens from [facility]. Patient is ok to pay $40 for insulin pen instead of $25 for insulin vial. Will follow while in the hospital.  Thank you, Nani Gasser. Marcus Hudgins, RN, MSN, CDE  Diabetes Coordinator Inpatient Glycemic Control Team Team Pager [phone] (8am-5pm) 08/23/2018 11:26 AM

## 2018-08-23 NOTE — Anesthesia Preprocedure Evaluation (Addendum)
Anesthesia Evaluation  Patient identified by MRN, date of birth, ID band Patient awake    Reviewed: Allergy & Precautions, NPO status , Patient's Chart, lab work & pertinent test results  Airway Mallampati: II  TM Distance: >3 FB Neck ROM: Full    Dental no notable dental hx.    Pulmonary PE   Pulmonary exam normal breath sounds clear to auscultation       Cardiovascular + CAD and + DVT  Normal cardiovascular exam Rhythm:Regular Rate:Normal  CATH: Prox RCA lesion is 30% stenosed. RPDA lesion is 30% stenosed. Prox Cx to Mid Cx lesion is 30% stenosed. Ost Ramus lesion is 30% stenosed. Prox LAD lesion is 100% stenosed. Ost 1st Diag lesion is 80% stenosed. 1st Diag lesion is 80% stenosed. 1. CAD with totally occluded mid LAD lesion and high grade lesion in diagonal ostium 2. Otherwise mild diffuse CAD 3. EF 35-40% by echo with large LV thrombus  ECHO: Study data: Focused echocardiogram to assess LV thrombus. Large apical thrombus is present. Largely unchanged appearance of thrombus which measures 62 x 18 mm in approximate dimension. Thrombus is mobile and extends toward the LVOT without obstructing the mitral or aortic valves. Left ventricle: The cavity size was moderately dilated. Wall thickness was normal. Systolic function was moderately reduced. The estimated ejection fraction was in the range of 35% to 40%. Regional wall motion abnormality: Akinesis of the apical myocardium. Right atrium: The atrium was normal in size. Pericardium, extracardiac: There was no pericardial effusion.     Neuro/Psych  Headaches, negative psych ROS   GI/Hepatic Neg liver ROS, GERD  Medicated and Controlled,  Endo/Other  diabetes, Insulin DependentMorbid obesity  Renal/GU negative Renal ROS     Musculoskeletal  (+) Arthritis ,   Abdominal (+) + obese,   Peds  Hematology  (+) anemia ,   Anesthesia Other Findings LV clot   CAD  Reproductive/Obstetrics                            Anesthesia Physical Anesthesia Plan  ASA: IV  Anesthesia Plan: General   Post-op Pain Management:    Induction: Intravenous  PONV Risk Score and Plan: 2 and Midazolam and Treatment may vary due to age or medical condition  Airway Management Planned: Oral ETT  Additional Equipment: Arterial line, CVP, PA Cath, TEE and Ultrasound Guidance Line Placement  Intra-op Plan:   Post-operative Plan: Post-operative intubation/ventilation  Informed Consent: I have reviewed the patients History and Physical, chart, labs and discussed the procedure including the risks, benefits and alternatives for the proposed anesthesia with the patient or authorized representative who has indicated his/her understanding and acceptance.   Dental advisory given  Plan Discussed with: CRNA  Anesthesia Plan Comments:         Anesthesia Quick Evaluation

## 2018-08-23 NOTE — Progress Notes (Signed)
      [address]       [place] [id number]             [phone]       Patient is without complaints today, feels well. I discussed the repeat echocardiogram with the patient and his mother and father. I discussed with him proceeding with coronary artery bypass grafting ventriculotomy and removal of clot as we have seen no decrease in size of the clot after 6 days of heparin. The risks and options have been discussed with him in detail, he understands since the clot is mobile there is  increased risk of stroke but there is also risk of stroke with cardiac surgery.  We would plan to bypass the LAD and diagonal.  Perform left ventriculotomy and removal of clot.  The patient would need to be anticoagulated postop as soon as the risk of perioperative bleeding has decreased.  The goals risks and alternatives of the planned surgical procedure coronary artery bypass grafting , left ventriculotomy , removal of LV clot , TEE , endoscopic vein harvest have been discussed with the patient in detail. The risks of the procedure including death, infection, stroke, myocardial infarction, bleeding, blood transfusion have all been discussed specifically.  I have quoted Levonne SpillerWilliam L Wooley Jr. a 5 % of perioperative mortality and a complication rate as high as 50 %. The patient's questions have been answered.Levonne SpillerWilliam L Hingle Jr. is willing  to proceed with the planned procedure.  Delight OvensEdward B Citlally Captain MD      [address] [place] [id number] Office [phone]   Beeper [phone]

## 2018-08-23 NOTE — Progress Notes (Signed)
PROGRESS NOTE    Marcus SpillerWilliam L Vanpelt Jr.  WUJ:[id number] DOB: 01/31/1971 DOA: 08/17/2018 PCP: [facility]   Brief Narrative: Patient is a 47 year old male with past medical history of insulin-dependent diabetes mellitus, right leg DVT, recurrent PE we stopped anticoagulation prematurely due to financial condition who presented to the emergency department yesterday night with complaints of acute onset of substernal chest pain, shortness of breath and right lower extremity numbness.  Patient was found to have small non-occlusive right upper lobe PE without right heart strain but also large left liver lesion ventricular thrombus.  Patient started on heparin drip.  Cardiology consulted.  Given the size of left ventricular thrombus we also consulted cardiothoracic surgery who recommended to do TEE. He was found to have decreased  arterial flow on his left lower extremity very concerning for right lower extremity thromboembolism.  There was no flow in SFA/popliteal/tibials on duplex. Consulted vascular surgery and he is underwent emergent vascular intervention/thrombectomy on 08/18/18.Underwent Cardiac MRI and left heart catheterization.  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  No chest pain, troponins negative.  Cardiology, cardiothoracic surgery following.  Continue heparin drip.  Hypercoagulable workup mostly negative. Pending factor V leiden and prothrombin gene mutation. Cardiac MRI showed very large mobile thrombus 6 cm x 1.5 cm.  Moderate left ventricular enlargement with septal, apical and distal  anterior wall hypokinesis.  Ejection fraction of 38%. Waiting from  recommendation from cardiology/cardiothoracic surgery for oral anticoagulation initiation/ thrombectomy.  Coronary artery disease: Left heart catheterization  On 12/24 showed CAD with totally occluded mid LAD lesion and high grade lesion in diagonal ostium.Mild diffuse CAD.  Currently plan is to continue medical management as per cardiology.  Right lower extremity thromboembolism: Presented with right lower extremity numbness and tingling.  Found to have no flow in SFA/popliteal/tibials on duplex.   Underwent emergent thromboembolectomy by vascular surgery.  Continue heparin drip.  S/P removal of drain on his right thigh.  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  He was on Xarelto but he stopped taking that due to financial conditions.  Presented at this time with shortness of breath and chest pain.  Found to have small nonocclusive PE involving right upper lobe without CT evidence of heart strain.  Currently on heparin drip.  Due to presence of left ventricular thrombus, he might need to be put on Coumadin.  Currently hemodynamically stable.  Denies any chest pain.  Insulin-dependent diabetes mellitus: Hemoglobin was 11.2% in September 2019.  On Lantus and NovoLog at home.  Continue monitoring his blood sugars.  Continue Lantus and sliding his insulin.  Diabetic coordinator following.  Possible community-acquired pneumonia: Reports recent sore throat and rhinorrhea, bloody cough.Much improved now/  No fever or chills.  CT findings history of atypical pneumonia.  Antibiotics changed to oral .  Respiratory status stable.  Morbid obesity: Emphasized on the importance of balanced diet and exercise when he is discharged from the hospital.  Leukocytosis: Most likely reactive. Improving.         DVT prophylaxis: Heparin Code Status: Full Family Communication: Family member  present at the bedside  Disposition Plan: Home after clearance  from cardiology, cardiothoracic surgery, vascular surgery   Consultants: Cardiology, cardiothoracic surgery, vascular surgery  Procedures: Thromboembolectomy, cardiac MRI, left heart catheterization  Antimicrobials: Levaquin day 5/6  Subjective: Patient seen and examined the bedside this morning.  Remains comfortable.  No new issues Objective: Vitals:   08/22/18 2000 08/23/18 0412 08/23/18 0837 08/23/18 1217  BP: 135/73 127/70 133/68 135/83  Pulse:  66 75 83  Resp: (!) 24 14 17 14   Temp: 98.7 F (37.1 C) 98.3 F (36.8 C) 98.3 F (36.8 C) 98.5 F (36.9 C)  TempSrc: Oral Oral Oral Oral  SpO2: 96% 95% 99% 99%  Weight:      Height:        Intake/Output Summary (Last 24 hours) at 08/23/2018 1239 Last data filed at 08/23/2018 0736 Gross per 24 hour  Intake 480 ml  Output 700 ml  Net -220 ml   Filed Weights   08/21/18 0037  Weight: (!) 140.3 kg    Examination:  General exam: Appears calm and comfortable ,Not in distress,obese HEENT:PERRL,Oral mucosa moist, Ear/Nose normal on gross exam Respiratory system: Bilateral equal air entry, normal vesicular breath sounds, no wheezes or crackles  Cardiovascular system: S1 & S2 heard, RRR. No JVD, murmurs, rubs, gallops or clicks. No pedal edema. Gastrointestinal system: Abdomen is nondistended, soft and nontender. No organomegaly or masses felt. Normal bowel sounds heard. Central nervous system: Alert and oriented. No focal neurological deficits. Extremities: No edema, no clubbing ,no cyanosis, distal peripheral pulses palpable. Skin: No rashes, lesions or ulcers,no icterus ,no pallor MSK: Normal muscle bulk,tone ,power Psychiatry: Judgement and insight appear normal. Mood & affect appropriate.     Data Reviewed: I have personally reviewed following labs and imaging studies  CBC: Recent Labs  Lab 08/19/18 0708 08/20/18 0345 08/21/18 0229 08/22/18 0315  08/23/18 0310  WBC 11.1* 11.1* 11.4* 21.4* 12.5*  NEUTROABS 8.3* 6.6  --   --   --   HGB 11.3* 11.8* 12.1* 12.8* 12.2*  HCT 35.7* 35.3* 35.7* 38.1* 38.0*  MCV 86.4 84.2 84.4 83.7 84.4  PLT 267 253 238 361 350   Basic Metabolic Panel: Recent Labs  Lab 08/17/18 1925 08/18/18 0643 08/20/18 0345 08/21/18 0549  NA 137 136 137 136  K 3.6 3.8 3.4* 3.8  CL 98 100 101 100  CO2 20* 22 26 25   GLUCOSE 280* 266* 181* 179*  BUN 8 7 6 6   CREATININE 1.20 1.02 1.02 0.97  CALCIUM 8.8* 8.4* 8.1* 8.9   GFR: Estimated Creatinine Clearance: 136.3 mL/min (by C-G formula based on SCr of 0.97 mg/dL). Liver Function Tests: No results for input(s): AST, ALT, ALKPHOS, BILITOT, PROT, ALBUMIN in the last 168 hours. No results for input(s): LIPASE, AMYLASE in the last 168 hours. No results for input(s): AMMONIA in the last 168 hours. Coagulation Profile: Recent Labs  Lab 08/19/18 0422 08/20/18 0345 08/21/18 0229 08/22/18 0315  INR 1.08 1.07 1.08 1.06   Cardiac Enzymes: Recent Labs  Lab 08/18/18 1808  TROPONINI 0.04*   BNP (last 3 results) No results for input(s): PROBNP in the last 8760 hours. HbA1C: Recent Labs    08/21/18 0229  HGBA1C 10.3*   CBG: Recent Labs  Lab 08/22/18 1148 08/22/18 1643 08/22/18 2116 08/23/18 0614 08/23/18 1122  GLUCAP 215* 239* 243* 174* 209*   Lipid Profile: No results for input(s): CHOL, HDL, LDLCALC, TRIG, CHOLHDL, LDLDIRECT in the last 72 hours. Thyroid Function Tests: No results for input(s): TSH, T4TOTAL, FREET4, T3FREE,  THYROIDAB in the last 72 hours. Anemia Panel: No results for input(s): VITAMINB12, FOLATE, FERRITIN, TIBC, IRON, RETICCTPCT in the last 72 hours. Sepsis Labs: No results for input(s): PROCALCITON, LATICACIDVEN in the last 168 hours.  Recent Results (from the past 240 hour(s))  Culture, sputum-assessment     Status: None   Collection Time: 08/18/18 10:19 PM  Result Value Ref Range Status   Specimen Description EXPECTORATED  SPUTUM  Final   Special Requests Normal  Final   Sputum evaluation   Final    THIS SPECIMEN IS ACCEPTABLE FOR SPUTUM CULTURE Performed at [facility], [address] [place], [place] [address]    Report Status 08/19/2018 FINAL  Final  Culture, respiratory     Status: None   Collection Time: 08/18/18 10:19 PM  Result Value Ref Range Status   Specimen Description EXPECTORATED SPUTUM  Final   Special Requests Normal Reflexed from [id number]  Final   Gram Stain   Final    RARE WBC PRESENT, PREDOMINANTLY PMN RARE GRAM POSITIVE RODS Performed at [facility], [address] [place], [place] [address]    Culture FEW Consistent with normal respiratory flora.  Final   Report Status 08/21/2018 FINAL  Final         Radiology Studies: No results found.      Scheduled Meds: . insulin aspart  0-20 Units Subcutaneous TID WC  . insulin aspart  0-5 Units Subcutaneous QHS  . insulin glargine  40 Units Subcutaneous QHS  . levofloxacin  750 mg Oral Daily  . sodium chloride flush  3 mL Intravenous Q12H  . sodium chloride flush  3 mL Intravenous Q12H   Continuous Infusions: . sodium chloride 10 mL/hr at 08/21/18 1500  . heparin 2,100 Units/hr (08/23/18 1153)     LOS: 4 days    Time spent: 35 mins.More than 50% of that time was spent in counseling and/or coordination of care.      Burnadette PopAmrit Evert Wenrich, MD [facility] Pager [phone]  If 7PM-7AM, please contact night-coverage [email or web address] Password [email or web address] 08/23/2018, 12:39 PM

## 2018-08-24 ENCOUNTER — Inpatient Hospital Stay (HOSPITAL_COMMUNITY): Payer: Self-pay | Admitting: Certified Registered Nurse Anesthetist

## 2018-08-24 ENCOUNTER — Inpatient Hospital Stay (HOSPITAL_COMMUNITY): Payer: Self-pay

## 2018-08-24 ENCOUNTER — Inpatient Hospital Stay (HOSPITAL_COMMUNITY)
Admission: EM | Disposition: A | Discharge status: Home / Self Care | Payer: Self-pay | Source: Home / Self Care | Attending: Cardiothoracic Surgery

## 2018-08-24 HISTORY — PX: TEE WITHOUT CARDIOVERSION: SHX5443

## 2018-08-24 HISTORY — PX: ENDOVEIN HARVEST OF GREATER SAPHENOUS VEIN: SHX5059

## 2018-08-24 HISTORY — PX: CORONARY ARTERY BYPASS GRAFT: SHX141

## 2018-08-24 LAB — PREPARE RBC (CROSSMATCH)

## 2018-08-24 LAB — GLUCOSE, CAPILLARY
GLUCOSE-CAPILLARY: 142 mg/dL — AB (ref 70–99)
Glucose-Capillary: 164 mg/dL — ABNORMAL HIGH (ref 70–99)
Glucose-Capillary: 147 mg/dL — ABNORMAL HIGH (ref 70–99)
Glucose-Capillary: 151 mg/dL — ABNORMAL HIGH (ref 70–99)
Glucose-Capillary: 145 mg/dL — ABNORMAL HIGH (ref 70–99)
Glucose-Capillary: 147 mg/dL — ABNORMAL HIGH (ref 70–99)
Glucose-Capillary: 142 mg/dL — ABNORMAL HIGH (ref 70–99)

## 2018-08-24 LAB — POCT I-STAT, CHEM 8
BUN: 10 mg/dL (ref 6–20)
BUN: 10 mg/dL (ref 6–20)
BUN: 9 mg/dL (ref 6–20)
BUN: 9 mg/dL (ref 6–20)
BUN: 9 mg/dL (ref 6–20)
BUN: 9 mg/dL (ref 6–20)
CHLORIDE: 100 mmol/L (ref 98–111)
Calcium, Ion: 1.17 mmol/L (ref 1.15–1.40)
Calcium, Ion: 0.92 mmol/L — ABNORMAL LOW (ref 1.15–1.40)
Calcium, Ion: 1.15 mmol/L (ref 1.15–1.40)
Calcium, Ion: 1.09 mmol/L — ABNORMAL LOW (ref 1.15–1.40)
Calcium, Ion: 1.53 mmol/L (ref 1.15–1.40)
Calcium, Ion: 1.19 mmol/L (ref 1.15–1.40)
Chloride: 101 mmol/L (ref 98–111)
Chloride: 99 mmol/L (ref 98–111)
Chloride: 100 mmol/L (ref 98–111)
Chloride: 103 mmol/L (ref 98–111)
Chloride: 103 mmol/L (ref 98–111)
Creatinine, Ser: 0.8 mg/dL (ref 0.61–1.24)
Creatinine, Ser: 0.6 mg/dL — ABNORMAL LOW (ref 0.61–1.24)
Creatinine, Ser: 0.8 mg/dL (ref 0.61–1.24)
Creatinine, Ser: 0.6 mg/dL — ABNORMAL LOW (ref 0.61–1.24)
Creatinine, Ser: 0.7 mg/dL (ref 0.61–1.24)
Creatinine, Ser: 0.8 mg/dL (ref 0.61–1.24)
Glucose, Bld: 151 mg/dL — ABNORMAL HIGH (ref 70–99)
Glucose, Bld: 134 mg/dL — ABNORMAL HIGH (ref 70–99)
Glucose, Bld: 151 mg/dL — ABNORMAL HIGH (ref 70–99)
Glucose, Bld: 165 mg/dL — ABNORMAL HIGH (ref 70–99)
Glucose, Bld: 219 mg/dL — ABNORMAL HIGH (ref 70–99)
Glucose, Bld: 142 mg/dL — ABNORMAL HIGH (ref 70–99)
HCT: 33 % — ABNORMAL LOW (ref 39.0–52.0)
HCT: 26 % — ABNORMAL LOW (ref 39.0–52.0)
HCT: 31 % — ABNORMAL LOW (ref 39.0–52.0)
HCT: 31 % — ABNORMAL LOW (ref 39.0–52.0)
HEMATOCRIT: 28 % — AB (ref 39.0–52.0)
HEMATOCRIT: 30 % — AB (ref 39.0–52.0)
HEMOGLOBIN: 9.5 g/dL — AB (ref 13.0–17.0)
Hemoglobin: 11.2 g/dL — ABNORMAL LOW (ref 13.0–17.0)
Hemoglobin: 10.5 g/dL — ABNORMAL LOW (ref 13.0–17.0)
Hemoglobin: 8.8 g/dL — ABNORMAL LOW (ref 13.0–17.0)
Hemoglobin: 10.2 g/dL — ABNORMAL LOW (ref 13.0–17.0)
Hemoglobin: 10.5 g/dL — ABNORMAL LOW (ref 13.0–17.0)
POTASSIUM: 3.8 mmol/L (ref 3.5–5.1)
Potassium: 3.7 mmol/L (ref 3.5–5.1)
Potassium: 3.6 mmol/L (ref 3.5–5.1)
Potassium: 3.8 mmol/L (ref 3.5–5.1)
Potassium: 4.1 mmol/L (ref 3.5–5.1)
Potassium: 4.1 mmol/L (ref 3.5–5.1)
Sodium: 137 mmol/L (ref 135–145)
Sodium: 137 mmol/L (ref 135–145)
Sodium: 138 mmol/L (ref 135–145)
Sodium: 138 mmol/L (ref 135–145)
Sodium: 138 mmol/L (ref 135–145)
Sodium: 136 mmol/L (ref 135–145)
TCO2: 28 mmol/L (ref 22–32)
TCO2: 25 mmol/L (ref 22–32)
TCO2: 28 mmol/L (ref 22–32)
TCO2: 28 mmol/L (ref 22–32)
TCO2: 24 mmol/L (ref 22–32)
TCO2: 25 mmol/L (ref 22–32)

## 2018-08-24 LAB — POCT I-STAT 3, ART BLOOD GAS (G3+)
ACID-BASE DEFICIT: 3 mmol/L — AB (ref 0.0–2.0)
ACID-BASE EXCESS: 2 mmol/L (ref 0.0–2.0)
Acid-base deficit: 1 mmol/L (ref 0.0–2.0)
Acid-base deficit: 1 mmol/L (ref 0.0–2.0)
Bicarbonate: 26.9 mmol/L (ref 20.0–28.0)
Bicarbonate: 24.7 mmol/L (ref 20.0–28.0)
Bicarbonate: 23.8 mmol/L (ref 20.0–28.0)
Bicarbonate: 24.6 mmol/L (ref 20.0–28.0)
Bicarbonate: 24.3 mmol/L (ref 20.0–28.0)
O2 Saturation: 100 %
O2 Saturation: 99 %
O2 Saturation: 95 %
O2 Saturation: 96 %
O2 Saturation: 95 %
Patient temperature: 36
Patient temperature: 36.4
Patient temperature: 36.3
TCO2: 28 mmol/L (ref 22–32)
TCO2: 26 mmol/L (ref 22–32)
TCO2: 25 mmol/L (ref 22–32)
TCO2: 26 mmol/L (ref 22–32)
TCO2: 25 mmol/L (ref 22–32)
pCO2 arterial: 42.9 mmHg (ref 32.0–48.0)
pCO2 arterial: 45.3 mmHg (ref 32.0–48.0)
pCO2 arterial: 44.7 mmHg (ref 32.0–48.0)
pCO2 arterial: 41.7 mmHg (ref 32.0–48.0)
pCO2 arterial: 37.8 mmHg (ref 32.0–48.0)
pH, Arterial: 7.405 (ref 7.350–7.450)
pH, Arterial: 7.345 — ABNORMAL LOW (ref 7.350–7.450)
pH, Arterial: 7.329 — ABNORMAL LOW (ref 7.350–7.450)
pH, Arterial: 7.377 (ref 7.350–7.450)
pH, Arterial: 7.414 (ref 7.350–7.450)
pO2, Arterial: 381 mmHg — ABNORMAL HIGH (ref 83.0–108.0)
pO2, Arterial: 135 mmHg — ABNORMAL HIGH (ref 83.0–108.0)
pO2, Arterial: 77 mmHg — ABNORMAL LOW (ref 83.0–108.0)
pO2, Arterial: 80 mmHg — ABNORMAL LOW (ref 83.0–108.0)
pO2, Arterial: 74 mmHg — ABNORMAL LOW (ref 83.0–108.0)

## 2018-08-24 LAB — CBC
HCT: 36.2 % — ABNORMAL LOW (ref 39.0–52.0)
HCT: 32.6 % — ABNORMAL LOW (ref 39.0–52.0)
HEMOGLOBIN: 10.6 g/dL — AB (ref 13.0–17.0)
Hemoglobin: 12.2 g/dL — ABNORMAL LOW (ref 13.0–17.0)
MCH: 28.4 pg (ref 26.0–34.0)
MCH: 27.6 pg (ref 26.0–34.0)
MCHC: 33.7 g/dL (ref 30.0–36.0)
MCHC: 32.5 g/dL (ref 30.0–36.0)
MCV: 84.2 fL (ref 80.0–100.0)
MCV: 84.9 fL (ref 80.0–100.0)
NRBC: 0 % (ref 0.0–0.2)
Platelets: 391 10*3/uL (ref 150–400)
Platelets: 269 10*3/uL (ref 150–400)
RBC: 4.3 MIL/uL (ref 4.22–5.81)
RBC: 3.84 MIL/uL — AB (ref 4.22–5.81)
RDW: 13.5 % (ref 11.5–15.5)
RDW: 13.2 % (ref 11.5–15.5)
WBC: 11.3 10*3/uL — ABNORMAL HIGH (ref 4.0–10.5)
WBC: 22.2 10*3/uL — ABNORMAL HIGH (ref 4.0–10.5)
nRBC: 0 % (ref 0.0–0.2)

## 2018-08-24 LAB — HEPARIN LEVEL (UNFRACTIONATED): Heparin Unfractionated: 0.62 IU/mL (ref 0.30–0.70)

## 2018-08-24 LAB — HEMOGLOBIN AND HEMATOCRIT, BLOOD
HCT: 28.8 % — ABNORMAL LOW (ref 39.0–52.0)
Hemoglobin: 9.3 g/dL — ABNORMAL LOW (ref 13.0–17.0)

## 2018-08-24 LAB — BASIC METABOLIC PANEL
Anion gap: 10 (ref 5–15)
BUN: 13 mg/dL (ref 6–20)
CO2: 25 mmol/L (ref 22–32)
Calcium: 8.4 mg/dL — ABNORMAL LOW (ref 8.9–10.3)
Chloride: 101 mmol/L (ref 98–111)
Creatinine, Ser: 0.88 mg/dL (ref 0.61–1.24)
GFR calc Af Amer: >60 mL/min (ref >60)
GFR calc non Af Amer: >60 mL/min (ref >60)
Glucose, Bld: 193 mg/dL — ABNORMAL HIGH (ref 70–99)
Potassium: 3.7 mmol/L (ref 3.5–5.1)
Sodium: 136 mmol/L (ref 135–145)

## 2018-08-24 LAB — BLOOD GAS, ARTERIAL
Acid-Base Excess: 0.9 mmol/L (ref 0.0–2.0)
Bicarbonate: 24.7 mmol/L (ref 20.0–28.0)
Drawn by: 24513
FIO2: 21
O2 Saturation: 96.4 %
Patient temperature: 98.6
pCO2 arterial: 37.1 mmHg (ref 32.0–48.0)
pH, Arterial: 7.438 (ref 7.350–7.450)
pO2, Arterial: 83.2 mmHg (ref 83.0–108.0)

## 2018-08-24 LAB — POCT I-STAT 4, (NA,K, GLUC, HGB,HCT)
Glucose, Bld: 183 mg/dL — ABNORMAL HIGH (ref 70–99)
HEMATOCRIT: 29 % — AB (ref 39.0–52.0)
Hemoglobin: 9.9 g/dL — ABNORMAL LOW (ref 13.0–17.0)
Potassium: 3.7 mmol/L (ref 3.5–5.1)
Sodium: 138 mmol/L (ref 135–145)

## 2018-08-24 LAB — PROTIME-INR
INR: 1.18
Prothrombin Time: 14.9 seconds (ref 11.4–15.2)

## 2018-08-24 LAB — MRSA PCR SCREENING: MRSA by PCR: NEGATIVE

## 2018-08-24 LAB — PLATELET COUNT: Platelets: 306 10*3/uL (ref 150–400)

## 2018-08-24 LAB — APTT: aPTT: 30 seconds (ref 24–36)

## 2018-08-24 SURGERY — CORONARY ARTERY BYPASS GRAFTING (CABG)
Anesthesia: General | Site: Leg Lower | Laterality: Right

## 2018-08-24 MED ORDER — SODIUM CHLORIDE 0.9 % IV SOLN
INTRAVENOUS | Status: DC | PRN
  Administered 2018-08-24: 750 mg via INTRAVENOUS

## 2018-08-24 MED ORDER — ACETAMINOPHEN 650 MG RE SUPP
650.0000 mg | Freq: Once | RECTAL | Status: AC
  Administered 2018-08-24: 650 mg via RECTAL

## 2018-08-24 MED ORDER — ACETAMINOPHEN 160 MG/5ML PO SOLN: 650.0000 mg | Freq: Once | ORAL | Status: AC

## 2018-08-24 MED ORDER — BISACODYL 5 MG PO TBEC
10.0000 mg | DELAYED_RELEASE_TABLET | Freq: Every day | ORAL | Status: DC
  Administered 2018-08-25 – 2018-09-02 (×9): 10 mg via ORAL
  Filled 2018-08-24 (×9): qty 2

## 2018-08-24 MED ORDER — ROCURONIUM BROMIDE 50 MG/5ML IV SOSY
PREFILLED_SYRINGE | INTRAVENOUS | Status: AC
  Filled 2018-08-24: qty 15

## 2018-08-24 MED ORDER — METOCLOPRAMIDE HCL 5 MG/ML IJ SOLN
10.0000 mg | Freq: Four times a day (QID) | INTRAMUSCULAR | Status: AC
  Administered 2018-08-24 – 2018-08-25 (×4): 10 mg via INTRAVENOUS
  Filled 2018-08-24 (×3): qty 2

## 2018-08-24 MED ORDER — PANTOPRAZOLE SODIUM 40 MG PO TBEC: 40.0000 mg | DELAYED_RELEASE_TABLET | Freq: Every day | ORAL | Status: DC

## 2018-08-24 MED ORDER — TRAMADOL HCL 50 MG PO TABS
50.0000 mg | ORAL_TABLET | ORAL | Status: DC | PRN
  Administered 2018-08-25 – 2018-08-27 (×5): 100 mg via ORAL
  Administered 2018-08-28: 50 mg via ORAL
  Administered 2018-08-29 – 2018-09-02 (×4): 100 mg via ORAL
  Filled 2018-08-24: qty 2
  Filled 2018-08-24: qty 1
  Filled 2018-08-24 (×8): qty 2

## 2018-08-24 MED ORDER — CHLORHEXIDINE GLUCONATE 0.12 % MT SOLN
15.0000 mL | OROMUCOSAL | Status: AC
  Administered 2018-08-24: 15 mL via OROMUCOSAL

## 2018-08-24 MED ORDER — PROTAMINE SULFATE 10 MG/ML IV SOLN
INTRAVENOUS | Status: DC | PRN
  Administered 2018-08-24: 10 mg via INTRAVENOUS
  Administered 2018-08-24: 490 mg via INTRAVENOUS

## 2018-08-24 MED ORDER — PHENYLEPHRINE 40 MCG/ML (10ML) SYRINGE FOR IV PUSH (FOR BLOOD PRESSURE SUPPORT)
PREFILLED_SYRINGE | INTRAVENOUS | Status: AC
  Filled 2018-08-24: qty 10

## 2018-08-24 MED ORDER — PHENYLEPHRINE 40 MCG/ML (10ML) SYRINGE FOR IV PUSH (FOR BLOOD PRESSURE SUPPORT)
PREFILLED_SYRINGE | INTRAVENOUS | Status: DC | PRN
  Administered 2018-08-24: 80 ug via INTRAVENOUS

## 2018-08-24 MED ORDER — ROCURONIUM BROMIDE 50 MG/5ML IV SOSY
PREFILLED_SYRINGE | INTRAVENOUS | Status: AC
  Filled 2018-08-24: qty 5

## 2018-08-24 MED ORDER — LACTATED RINGERS IV SOLN
INTRAVENOUS | Status: DC | PRN
  Administered 2018-08-24: 07:00:00 via INTRAVENOUS

## 2018-08-24 MED ORDER — DEXMEDETOMIDINE HCL IN NACL 200 MCG/50ML IV SOLN
INTRAVENOUS | Status: AC
  Filled 2018-08-24: qty 50

## 2018-08-24 MED ORDER — SODIUM CHLORIDE 0.9% FLUSH
3.0000 mL | Freq: Two times a day (BID) | INTRAVENOUS | Status: DC
  Administered 2018-08-25 – 2018-08-26 (×4): 3 mL via INTRAVENOUS

## 2018-08-24 MED ORDER — SODIUM CHLORIDE 0.9 % IV SOLN
INTRAVENOUS | Status: DC | PRN
  Administered 2018-08-24: 1 [IU]/h via INTRAVENOUS

## 2018-08-24 MED ORDER — SODIUM CHLORIDE (PF) 0.9 % IJ SOLN
INTRAMUSCULAR | Status: AC
  Filled 2018-08-24: qty 20

## 2018-08-24 MED ORDER — EPHEDRINE SULFATE-NACL 50-0.9 MG/10ML-% IV SOSY
PREFILLED_SYRINGE | INTRAVENOUS | Status: DC | PRN
  Administered 2018-08-24: 10 mg via INTRAVENOUS

## 2018-08-24 MED ORDER — ACETAMINOPHEN 500 MG PO TABS
1000.0000 mg | ORAL_TABLET | Freq: Four times a day (QID) | ORAL | Status: AC
  Administered 2018-08-24 – 2018-08-29 (×20): 1000 mg via ORAL
  Filled 2018-08-24 (×20): qty 2

## 2018-08-24 MED ORDER — FENTANYL CITRATE (PF) 250 MCG/5ML IJ SOLN
INTRAMUSCULAR | Status: AC
  Filled 2018-08-24: qty 20

## 2018-08-24 MED ORDER — HEPARIN SODIUM (PORCINE) 1000 UNIT/ML IJ SOLN
INTRAMUSCULAR | Status: AC
  Filled 2018-08-24: qty 2

## 2018-08-24 MED ORDER — LACTATED RINGERS IV SOLN: INTRAVENOUS | Status: DC

## 2018-08-24 MED ORDER — SODIUM CHLORIDE 0.9 % IV SOLN
INTRAVENOUS | Status: DC
  Administered 2018-08-24: 16:00:00 via INTRAVENOUS

## 2018-08-24 MED ORDER — LACTATED RINGERS IV SOLN
INTRAVENOUS | Status: DC | PRN
  Administered 2018-08-24 (×2): via INTRAVENOUS

## 2018-08-24 MED ORDER — ALBUMIN HUMAN 5 % IV SOLN
250.0000 mL | INTRAVENOUS | Status: DC | PRN
  Administered 2018-08-24: 12.5 g via INTRAVENOUS

## 2018-08-24 MED ORDER — SODIUM CHLORIDE 0.9% FLUSH: 3.0000 mL | INTRAVENOUS | Status: DC | PRN

## 2018-08-24 MED ORDER — INSULIN REGULAR(HUMAN) IN NACL 100-0.9 UT/100ML-% IV SOLN
INTRAVENOUS | Status: DC
  Administered 2018-08-25: 8.8 [IU]/h via INTRAVENOUS
  Filled 2018-08-24: qty 100

## 2018-08-24 MED ORDER — SUCCINYLCHOLINE CHLORIDE 200 MG/10ML IV SOSY
PREFILLED_SYRINGE | INTRAVENOUS | Status: AC
  Filled 2018-08-24: qty 10

## 2018-08-24 MED ORDER — ASPIRIN 81 MG PO CHEW: 324.0000 mg | CHEWABLE_TABLET | Freq: Every day | ORAL | Status: DC

## 2018-08-24 MED ORDER — DOPAMINE-DEXTROSE 3.2-5 MG/ML-% IV SOLN
3.0000 ug/kg/min | INTRAVENOUS | Status: DC
  Administered 2018-08-26: 3 ug/kg/min via INTRAVENOUS
  Filled 2018-08-24: qty 250

## 2018-08-24 MED ORDER — FENTANYL CITRATE (PF) 250 MCG/5ML IJ SOLN
INTRAMUSCULAR | Status: DC | PRN
  Administered 2018-08-24: 100 ug via INTRAVENOUS
  Administered 2018-08-24: 250 ug via INTRAVENOUS
  Administered 2018-08-24: 100 ug via INTRAVENOUS
  Administered 2018-08-24: 550 ug via INTRAVENOUS
  Administered 2018-08-24: 150 ug via INTRAVENOUS
  Administered 2018-08-24: 100 ug via INTRAVENOUS

## 2018-08-24 MED ORDER — ACETAMINOPHEN 160 MG/5ML PO SOLN: 1000.0000 mg | Freq: Four times a day (QID) | ORAL | Status: DC

## 2018-08-24 MED ORDER — MAGNESIUM SULFATE 4 GM/100ML IV SOLN
4.0000 g | Freq: Once | INTRAVENOUS | Status: AC
  Administered 2018-08-24: 4 g via INTRAVENOUS
  Filled 2018-08-24: qty 100

## 2018-08-24 MED ORDER — DEXMEDETOMIDINE HCL IN NACL 200 MCG/50ML IV SOLN
0.0000 ug/kg/h | INTRAVENOUS | Status: DC
  Administered 2018-08-24: 0.6 ug/kg/h via INTRAVENOUS
  Filled 2018-08-24: qty 50

## 2018-08-24 MED ORDER — SODIUM CHLORIDE 0.9 % IV SOLN
INTRAVENOUS | Status: DC | PRN
  Administered 2018-08-24: 10 ug/min via INTRAVENOUS

## 2018-08-24 MED ORDER — INSULIN REGULAR BOLUS VIA INFUSION
0.0000 [IU] | Freq: Three times a day (TID) | INTRAVENOUS | Status: DC
  Filled 2018-08-24: qty 10

## 2018-08-24 MED ORDER — ROCURONIUM BROMIDE 100 MG/10ML IV SOLN
INTRAVENOUS | Status: DC | PRN
  Administered 2018-08-24: 100 mg via INTRAVENOUS
  Administered 2018-08-24 (×4): 50 mg via INTRAVENOUS

## 2018-08-24 MED ORDER — MIDAZOLAM HCL 5 MG/5ML IJ SOLN
INTRAMUSCULAR | Status: DC | PRN
  Administered 2018-08-24: 4 mg via INTRAVENOUS
  Administered 2018-08-24: 1 mg via INTRAVENOUS
  Administered 2018-08-24 (×2): 5 mg via INTRAVENOUS
  Administered 2018-08-24: 2 mg via INTRAVENOUS
  Administered 2018-08-24: 3 mg via INTRAVENOUS

## 2018-08-24 MED ORDER — HEPARIN SODIUM (PORCINE) 1000 UNIT/ML IJ SOLN
INTRAMUSCULAR | Status: AC
  Filled 2018-08-24: qty 1

## 2018-08-24 MED ORDER — POTASSIUM CHLORIDE 10 MEQ/50ML IV SOLN
10.0000 meq | INTRAVENOUS | Status: AC
  Administered 2018-08-24 (×3): 10 meq via INTRAVENOUS

## 2018-08-24 MED ORDER — NITROGLYCERIN IN D5W 200-5 MCG/ML-% IV SOLN: 0.0000 ug/min | INTRAVENOUS | Status: DC

## 2018-08-24 MED ORDER — TRANEXAMIC ACID 1000 MG/10ML IV SOLN
1.5000 mg/kg/h | INTRAVENOUS | Status: DC
  Filled 2018-08-24: qty 25

## 2018-08-24 MED ORDER — FENTANYL CITRATE (PF) 250 MCG/5ML IJ SOLN
INTRAMUSCULAR | Status: AC
  Filled 2018-08-24: qty 5

## 2018-08-24 MED ORDER — EPHEDRINE 5 MG/ML INJ
INTRAVENOUS | Status: AC
  Filled 2018-08-24: qty 10

## 2018-08-24 MED ORDER — SUCCINYLCHOLINE CHLORIDE 200 MG/10ML IV SOSY
PREFILLED_SYRINGE | INTRAVENOUS | Status: DC | PRN
  Administered 2018-08-24: 120 mg via INTRAVENOUS

## 2018-08-24 MED ORDER — FAMOTIDINE IN NACL 20-0.9 MG/50ML-% IV SOLN
20.0000 mg | Freq: Two times a day (BID) | INTRAVENOUS | Status: DC
  Administered 2018-08-24: 20 mg via INTRAVENOUS

## 2018-08-24 MED ORDER — ALBUMIN HUMAN 5 % IV SOLN
INTRAVENOUS | Status: DC | PRN
  Administered 2018-08-24: 14:00:00 via INTRAVENOUS

## 2018-08-24 MED ORDER — LACTATED RINGERS IV SOLN
INTRAVENOUS | Status: DC | PRN
  Administered 2018-08-24: 08:00:00 via INTRAVENOUS

## 2018-08-24 MED ORDER — MILRINONE LACTATE IN DEXTROSE 20-5 MG/100ML-% IV SOLN
0.3000 ug/kg/min | INTRAVENOUS | Status: DC
  Administered 2018-08-24 – 2018-08-25 (×4): 0.3 ug/kg/min via INTRAVENOUS
  Administered 2018-08-26: 0.25 ug/kg/min via INTRAVENOUS
  Administered 2018-08-26: 0.3 ug/kg/min via INTRAVENOUS
  Filled 2018-08-24 (×6): qty 100

## 2018-08-24 MED ORDER — SODIUM CHLORIDE 0.9 % IV SOLN: 250.0000 mL | INTRAVENOUS | Status: DC

## 2018-08-24 MED ORDER — MORPHINE SULFATE (PF) 2 MG/ML IV SOLN
1.0000 mg | INTRAVENOUS | Status: DC | PRN
  Administered 2018-08-25 (×3): 2 mg via INTRAVENOUS
  Filled 2018-08-24 (×3): qty 1

## 2018-08-24 MED ORDER — PROPOFOL 10 MG/ML IV BOLUS
INTRAVENOUS | Status: AC
  Filled 2018-08-24: qty 40

## 2018-08-24 MED ORDER — PROPOFOL 10 MG/ML IV BOLUS
INTRAVENOUS | Status: DC | PRN
  Administered 2018-08-24: 110 mg via INTRAVENOUS

## 2018-08-24 MED ORDER — LACTATED RINGERS IV SOLN: 500.0000 mL | Freq: Once | INTRAVENOUS | Status: DC | PRN

## 2018-08-24 MED ORDER — MIDAZOLAM HCL 2 MG/2ML IJ SOLN: 2.0000 mg | INTRAMUSCULAR | Status: DC | PRN

## 2018-08-24 MED ORDER — ONDANSETRON HCL 4 MG/2ML IJ SOLN
4.0000 mg | Freq: Four times a day (QID) | INTRAMUSCULAR | Status: DC | PRN
  Administered 2018-08-24 – 2018-08-30 (×5): 4 mg via INTRAVENOUS
  Filled 2018-08-24 (×5): qty 2

## 2018-08-24 MED ORDER — VANCOMYCIN HCL IN DEXTROSE 1-5 GM/200ML-% IV SOLN
1000.0000 mg | Freq: Once | INTRAVENOUS | Status: AC
  Administered 2018-08-24: 1000 mg via INTRAVENOUS
  Filled 2018-08-24: qty 200

## 2018-08-24 MED ORDER — BISACODYL 10 MG RE SUPP: 10.0000 mg | Freq: Every day | RECTAL | Status: DC

## 2018-08-24 MED ORDER — PHENYLEPHRINE HCL-NACL 20-0.9 MG/250ML-% IV SOLN: 0.0000 ug/min | INTRAVENOUS | Status: DC

## 2018-08-24 MED ORDER — MIDAZOLAM HCL (PF) 10 MG/2ML IJ SOLN
INTRAMUSCULAR | Status: AC
  Filled 2018-08-24: qty 2

## 2018-08-24 MED ORDER — SODIUM CHLORIDE 0.9 % IV SOLN
1.5000 g | Freq: Two times a day (BID) | INTRAVENOUS | Status: AC
  Administered 2018-08-24 – 2018-08-26 (×4): 1.5 g via INTRAVENOUS
  Filled 2018-08-24 (×4): qty 1.5

## 2018-08-24 MED ORDER — HEMOSTATIC AGENTS (NO CHARGE) OPTIME
TOPICAL | Status: DC | PRN
  Administered 2018-08-24: 1 via TOPICAL
  Administered 2018-08-24: 3 via TOPICAL

## 2018-08-24 MED ORDER — METOPROLOL TARTRATE 25 MG/10 ML ORAL SUSPENSION: 12.5000 mg | Freq: Two times a day (BID) | ORAL | Status: DC

## 2018-08-24 MED ORDER — OXYCODONE HCL 5 MG PO TABS
5.0000 mg | ORAL_TABLET | ORAL | Status: DC | PRN
  Filled 2018-08-24: qty 1

## 2018-08-24 MED ORDER — SODIUM CHLORIDE 0.9 % IV SOLN
INTRAVENOUS | Status: DC | PRN
  Administered 2018-08-24: 40 ug/min via INTRAVENOUS

## 2018-08-24 MED ORDER — SODIUM CHLORIDE 0.45 % IV SOLN
INTRAVENOUS | Status: DC | PRN
  Administered 2018-08-24: 16:00:00 via INTRAVENOUS

## 2018-08-24 MED ORDER — LIDOCAINE 2% (20 MG/ML) 5 ML SYRINGE
INTRAMUSCULAR | Status: AC
  Filled 2018-08-24: qty 5

## 2018-08-24 MED ORDER — ORAL CARE MOUTH RINSE
15.0000 mL | Freq: Two times a day (BID) | OROMUCOSAL | Status: DC
  Administered 2018-08-25: 15 mL via OROMUCOSAL

## 2018-08-24 MED ORDER — DOCUSATE SODIUM 100 MG PO CAPS
200.0000 mg | ORAL_CAPSULE | Freq: Every day | ORAL | Status: DC
  Administered 2018-08-25 – 2018-09-02 (×8): 200 mg via ORAL
  Filled 2018-08-24 (×8): qty 2

## 2018-08-24 MED ORDER — CALCIUM CHLORIDE 10 % IV SOLN
INTRAVENOUS | Status: DC | PRN
  Administered 2018-08-24 (×2): 0.5 g via INTRAVENOUS

## 2018-08-24 MED ORDER — ASPIRIN EC 325 MG PO TBEC
325.0000 mg | DELAYED_RELEASE_TABLET | Freq: Every day | ORAL | Status: DC
  Administered 2018-08-25 – 2018-09-02 (×9): 325 mg via ORAL
  Filled 2018-08-24 (×9): qty 1

## 2018-08-24 MED ORDER — SODIUM CHLORIDE 0.9 % IV SOLN
INTRAVENOUS | Status: DC | PRN
  Administered 2018-08-24: 0.2 ug/kg/h via INTRAVENOUS
  Administered 2018-08-24: .5 ug/kg/h via INTRAVENOUS

## 2018-08-24 MED ORDER — ROCURONIUM BROMIDE 50 MG/5ML IV SOSY
PREFILLED_SYRINGE | INTRAVENOUS | Status: AC
  Filled 2018-08-24: qty 10

## 2018-08-24 MED ORDER — METOPROLOL TARTRATE 12.5 MG HALF TABLET: 12.5000 mg | ORAL_TABLET | Freq: Two times a day (BID) | ORAL | Status: DC

## 2018-08-24 MED ORDER — METOPROLOL TARTRATE 5 MG/5ML IV SOLN: 2.5000 mg | INTRAVENOUS | Status: DC | PRN

## 2018-08-24 MED ORDER — HEPARIN SODIUM (PORCINE) 1000 UNIT/ML IJ SOLN
INTRAMUSCULAR | Status: DC | PRN
  Administered 2018-08-24: 50000 [IU] via INTRAVENOUS
  Administered 2018-08-24: 15000 [IU] via INTRAVENOUS

## 2018-08-24 SURGICAL SUPPLY — 76 items
ADH SKN CLS APL DERMABOND .7 (GAUZE/BANDAGES/DRESSINGS) ×3
BAG DECANTER FOR FLEXI CONT (MISCELLANEOUS) ×4 IMPLANT
BANDAGE ACE 4X5 VEL STRL LF (GAUZE/BANDAGES/DRESSINGS) ×4 IMPLANT
BANDAGE ACE 6X5 VEL STRL LF (GAUZE/BANDAGES/DRESSINGS) ×4 IMPLANT
BANDAGE ELASTIC 4 VELCRO ST LF (GAUZE/BANDAGES/DRESSINGS) ×1 IMPLANT
BANDAGE ELASTIC 6 VELCRO ST LF (GAUZE/BANDAGES/DRESSINGS) ×1 IMPLANT
BLADE STERNUM SYSTEM 6 (BLADE) ×4 IMPLANT
BLADE SURG 11 STRL SS (BLADE) ×1 IMPLANT
BNDG GAUZE ELAST 4 BULKY (GAUZE/BANDAGES/DRESSINGS) ×4 IMPLANT
CANISTER SUCT 3000ML PPV (MISCELLANEOUS) ×4 IMPLANT
CANISTER WOUNDNEG PRESSURE 500 (CANNISTER) ×1 IMPLANT
CANNULA ARTERIAL NVNT 3/8 22FR (MISCELLANEOUS) ×1 IMPLANT
CATH CPB KIT GERHARDT (MISCELLANEOUS) ×4 IMPLANT
CATH THORACIC 28FR (CATHETERS) ×4 IMPLANT
CONT SPEC 4OZ CLIKSEAL STRL BL (MISCELLANEOUS) ×2 IMPLANT
COVER MAYO STAND STRL (DRAPES) ×1 IMPLANT
COVER WAND RF STERILE (DRAPES) ×4 IMPLANT
CRADLE DONUT ADULT HEAD (MISCELLANEOUS) ×4 IMPLANT
DERMABOND ADVANCED (GAUZE/BANDAGES/DRESSINGS) ×1
DERMABOND ADVANCED .7 DNX12 (GAUZE/BANDAGES/DRESSINGS) IMPLANT
DRAIN CHANNEL 28F RND 3/8 FF (WOUND CARE) ×4 IMPLANT
DRAPE CARDIOVASCULAR INCISE (DRAPES) ×4
DRAPE SLUSH/WARMER DISC (DRAPES) ×4 IMPLANT
DRAPE SRG 135X102X78XABS (DRAPES) ×3 IMPLANT
DRSG AQUACEL AG ADV 3.5X14 (GAUZE/BANDAGES/DRESSINGS) ×4 IMPLANT
ELECT BLADE 4.0 EZ CLEAN MEGAD (MISCELLANEOUS) ×4
ELECT REM PT RETURN 9FT ADLT (ELECTROSURGICAL) ×8
ELECTRODE BLDE 4.0 EZ CLN MEGD (MISCELLANEOUS) ×3 IMPLANT
ELECTRODE REM PT RTRN 9FT ADLT (ELECTROSURGICAL) ×6 IMPLANT
FELT TEFLON 1X6 (MISCELLANEOUS) ×9 IMPLANT
GAUZE SPONGE 4X4 12PLY STRL (GAUZE/BANDAGES/DRESSINGS) ×8 IMPLANT
GAUZE SPONGE 4X4 12PLY STRL LF (GAUZE/BANDAGES/DRESSINGS) ×1 IMPLANT
GLOVE BIO SURGEON STRL SZ 6.5 (GLOVE) ×12 IMPLANT
GOWN STRL REUS W/ TWL LRG LVL3 (GOWN DISPOSABLE) ×12 IMPLANT
GOWN STRL REUS W/TWL LRG LVL3 (GOWN DISPOSABLE) ×16
HEMOSTAT POWDER SURGIFOAM 1G (HEMOSTASIS) ×12 IMPLANT
HEMOSTAT SURGICEL 2X14 (HEMOSTASIS) ×4 IMPLANT
KIT BASIN OR (CUSTOM PROCEDURE TRAY) ×4 IMPLANT
KIT CATH SUCT 8FR (CATHETERS) ×4 IMPLANT
KIT PREVENA INCISION MGT20CM45 (CANNISTER) ×1 IMPLANT
KIT SUCTION CATH 14FR (SUCTIONS) ×8 IMPLANT
KIT TURNOVER KIT B (KITS) ×4 IMPLANT
KIT VASOVIEW HEMOPRO 2 VH 4000 (KITS) ×4 IMPLANT
LEAD PACING MYOCARDI (MISCELLANEOUS) ×4 IMPLANT
LINE VENT (MISCELLANEOUS) ×1 IMPLANT
MARKER GRAFT CORONARY BYPASS (MISCELLANEOUS) ×12 IMPLANT
NS IRRIG 1000ML POUR BTL (IV SOLUTION) ×20 IMPLANT
PACK E OPEN HEART (SUTURE) ×4 IMPLANT
PACK OPEN HEART (CUSTOM PROCEDURE TRAY) ×4 IMPLANT
PAD ARMBOARD 7.5X6 YLW CONV (MISCELLANEOUS) ×8 IMPLANT
PAD ELECT DEFIB RADIOL ZOLL (MISCELLANEOUS) ×4 IMPLANT
PENCIL BUTTON HOLSTER BLD 10FT (ELECTRODE) ×4 IMPLANT
SET CARDIOPLEGIA MPS 5001102 (MISCELLANEOUS) ×1 IMPLANT
SOLUTION ANTI FOG 6CC (MISCELLANEOUS) ×1 IMPLANT
SPONGE LAP 18X18 RF (DISPOSABLE) ×2 IMPLANT
SUT BONE WAX W31G (SUTURE) ×4 IMPLANT
SUT ETHIBOND NAB MH 2-0 36IN (SUTURE) ×5 IMPLANT
SUT MNCRL AB 4-0 PS2 18 (SUTURE) ×1 IMPLANT
SUT PROLENE 3 0 SH1 36 (SUTURE) ×5 IMPLANT
SUT PROLENE 4 0 TF (SUTURE) ×8 IMPLANT
SUT PROLENE 6 0 CC (SUTURE) ×8 IMPLANT
SUT PROLENE 7 0 BV1 MDA (SUTURE) ×4 IMPLANT
SUT SILK 2 0 SH CR/8 (SUTURE) ×1 IMPLANT
SUT STEEL 6MS V (SUTURE) ×4 IMPLANT
SUT STEEL SZ 6 DBL 3X14 BALL (SUTURE) ×4 IMPLANT
SUT VIC AB 1 CTX 18 (SUTURE) ×8 IMPLANT
SUT VIC AB 2-0 CT1 27 (SUTURE) ×4
SUT VIC AB 2-0 CT1 TAPERPNT 27 (SUTURE) IMPLANT
SYSTEM SAHARA CHEST DRAIN ATS (WOUND CARE) ×4 IMPLANT
TAPE CLOTH SURG 4X10 WHT LF (GAUZE/BANDAGES/DRESSINGS) ×1 IMPLANT
TOWEL GREEN STERILE (TOWEL DISPOSABLE) ×4 IMPLANT
TOWEL GREEN STERILE FF (TOWEL DISPOSABLE) ×4 IMPLANT
TRAY FOLEY SLVR 16FR TEMP STAT (SET/KITS/TRAYS/PACK) ×4 IMPLANT
TUBING INSUFFLATION (TUBING) ×4 IMPLANT
UNDERPAD 30X30 (UNDERPADS AND DIAPERS) ×4 IMPLANT
WATER STERILE IRR 1000ML POUR (IV SOLUTION) ×8 IMPLANT

## 2018-08-24 NOTE — Progress Notes (Signed)
Patient ID: Marcus SpillerWilliam L Hockman Jr., male   DOB: 12-26-1970, 47 y.o.   MRN: [id number]  TCTS Evening Rounds:   Hemodynamically stable  CI = 2.4 on milrinone 0.3 and dop 3  Weaning on vent   Urine output good  CT output low  CBC    Component Value Date/Time   WBC 22.2 (H) 08/24/2018 1530   RBC 3.84 (L) 08/24/2018 1530   HGB 10.6 (L) 08/24/2018 1530   HCT 32.6 (L) 08/24/2018 1530   PLT 269 08/24/2018 1530   MCV 84.9 08/24/2018 1530   MCH 27.6 08/24/2018 1530   MCHC 32.5 08/24/2018 1530   RDW 13.2 08/24/2018 1530   LYMPHSABS 3.5 08/20/2018 0345   MONOABS 0.9 08/20/2018 0345   EOSABS 0.1 08/20/2018 0345   BASOSABS 0.0 08/20/2018 0345     BMET    Component Value Date/Time   NA 138 08/24/2018 1422   K 3.8 08/24/2018 1422   CL 103 08/24/2018 1422   CO2 25 08/24/2018 0403   GLUCOSE 219 (H) 08/24/2018 1422   BUN 9 08/24/2018 1422   CREATININE 0.70 08/24/2018 1422   CALCIUM 8.4 (L) 08/24/2018 0403   GFRNONAA >60 08/24/2018 0403   GFRAA >60 08/24/2018 0403     A/P:  Stable postop course. Continue current plans

## 2018-08-24 NOTE — Progress Notes (Addendum)
PROGRESS NOTE    Marcus SpillerWilliam L Sime Jr.  ZOX:[id number] DOB: 1970/11/25 DOA: 08/17/2018 PCP: [facility]   Brief Narrative: Patient is a 47 year old male with past medical history of insulin-dependent diabetes mellitus, right leg DVT, recurrent PE we stopped anticoagulation prematurely due to financial condition who presented to the emergency department yesterday night with complaints of acute onset of substernal chest pain, shortness of breath and right lower extremity numbness.  Patient was found to have small non-occlusive right upper lobe PE without right heart strain but also large left liver lesion ventricular thrombus.  Patient started on heparin drip.  Cardiology consulted.  Given the size of left ventricular thrombus we also consulted cardiothoracic surgery who recommended to do TEE. He was found to have decreased  arterial flow on his left lower extremity very concerning for right lower extremity thromboembolism.  There was no flow in SFA/popliteal/tibials on duplex. Consulted vascular surgery and he is underwent emergent vascular intervention/thrombectomy on 08/18/18.Underwent Cardiac MRI and left heart catheterization. Underwent CABG x2 and resection of left ventricular thrombus by cardiac surgery today.  Assessment & Plan:   Principal Problem:   Left ventricular thrombus without MI Active Problems:   Pulmonary embolism (HCC)   Type 2 diabetes mellitus with hyperglycemia (HCC)   CAP (community acquired pneumonia)   Left ventricular mural thrombus without myocardial infarction  Left ventricular thrombus: Underwent resection of left ventricular thrombus by cardiothoracic surgery on 08/24/18. Found to have large, mobile density concerning for thrombus in the left ventricle with apical hypokinesis.  No chest pain, troponins negative.  Cardiology, cardiothoracic surgery following.  Hypercoagulable workup mostly negative. Cardiac MRI showed very large  mobile thrombus 6 cm x 1.5 cm.  Moderate left ventricular enlargement with septal, apical and distal anterior wall hypokinesis.  Ejection fraction of 38%. He was on heparin drip until this morning .Oral anticoagulation will be started as per cardiology/cardiothoracic surgery recommendation.  Coronary artery disease: Left heart catheterization  On 12/24 showed CAD with totally occluded mid LAD lesion and high grade lesion in diagonal ostium.Mild diffuse CAD. CABG x2 by cardiothoracic surgery today.  Right lower extremity thromboembolism: Presented with right lower extremity numbness and tingling.  Found to have no flow in SFA/popliteal/tibials on duplex.   Underwent emergent thromboembolectomy by vascular surgery.  Was on  heparin drip.  S/P removal of drain on his right thigh.  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  He was on Xarelto but he stopped taking that due to financial conditions.  Presented at this time with shortness of breath and chest pain.  Found to have small nonocclusive PE involving right upper lobe without CT evidence of heart strain.  Was on heparin drip.  Due to presence of left ventricular thrombus, he might need to be put on Coumadin.  AntiCoagulation recommendation as per cardiothoracic surgery, cardiology.  Insulin-dependent diabetes mellitus: Hemoglobin was 11.2% in September 2019.  On Lantus and NovoLog at home.  Continue monitoring his blood sugars.  Continue Lantus and sliding his insulin.  Diabetic coordinator following.  Possible community-acquired pneumonia: Reported recent sore throat and rhinorrhea, bloody cough.Much improved now/  No fever or chills.  CT findings suggestive  of atypical pneumonia.  Antibiotics changed to oral and he completed the course.  Morbid obesity: Emphasized on the importance of balanced diet and exercise when he is discharged from the hospital.  Leukocytosis: Most likely reactive.  Improving.  Monitor the trend  As per  the nurse, the plan is to extubate later this evening. In case her remains intubated till tomorrow AM, will sign off.         DVT prophylaxis: Heparin Code Status: Full Family Communication: None present at the bedside Disposition Plan: Home after clearance  from cardiology, cardiothoracic surgery, vascular surgery   Consultants: Cardiology, cardiothoracic surgery, vascular surgery  Procedures: Thromboembolectomy, cardiac MRI, left heart catheterization, CABG, thrombectomy  Antimicrobials: Completed the course  Subjective: Patient seen and examined at the bedside in ICU after surgery. Currently is intubated.  Objective: Vitals:   08/24/18 1615 08/24/18 1630 08/24/18 1645 08/24/18 1700  BP:    92/66  Pulse: 100 99 98 97  Resp: 13 12 16 15   Temp: (!) 96.8 F (36 C) (!) 96.8 F (36 C) (!) 96.8 F (36 C) (!) 96.8 F (36 C)  TempSrc:      SpO2: 96% 97% 97% 97%  Weight:      Height:        Intake/Output Summary (Last 24 hours) at 08/24/2018 1724 Last data filed at 08/24/2018 1700 Gross per 24 hour  Intake 3813.81 ml  Output 2605 ml  Net 1208.81 ml   Filed Weights   08/21/18 0037  Weight: (!) 140.3 kg    Examination:  General exam: intubated,obese Respiratory system: Bilateral equal air entry, normal vesicular breath sounds, no wheezes or crackles  Cardiovascular system: S1 & S2 heard, RRR. No murmurs, rubs, gallops or clicks. No pedal edema.  Surgical wounds on the chest and upper abdomen covered by dressings. Gastrointestinal system: Abdomen is nondistended, soft and nontender.  Central nervous system:intubated Extremities: No edema, no clubbing ,no cyanosis, distal peripheral pulses palpable. Skin: No rashes, lesions or ulcers,no icterus ,no pallor    Data Reviewed: I have personally reviewed following labs and imaging studies  CBC: Recent Labs  Lab 08/19/18 0708 08/20/18 0345 08/21/18 0229 08/22/18 0315  08/23/18 0310 08/24/18 0403  08/24/18 1155 08/24/18 1222 08/24/18 1319 08/24/18 1422 08/24/18 1530  WBC 11.1* 11.1* 11.4* 21.4* 12.5* 11.3*  --   --   --   --   --  22.2*  NEUTROABS 8.3* 6.6  --   --   --   --   --   --   --   --   --   --   HGB 11.3* 11.8* 12.1* 12.8* 12.2* 12.2*   < > 10.5* 8.8* 9.5*  9.3* 10.2* 10.6*  HCT 35.7* 35.3* 35.7* 38.1* 38.0* 36.2*   < > 31.0* 26.0* 28.0*  28.8* 30.0* 32.6*  MCV 86.4 84.2 84.4 83.7 84.4 84.2  --   --   --   --   --  84.9  PLT 267 253 238 361 350 391  --   --   --  306  --  269   < > = values in this interval not displayed.   Basic Metabolic Panel: Recent Labs  Lab 08/18/18 0643 08/20/18 0345 08/21/18 0549 08/23/18 1843 08/24/18 0403 08/24/18 1019 08/24/18 1155 08/24/18 1222 08/24/18 1319 08/24/18 1422  NA 136 137 136 136 136 137 137 138 138 138   K 3.8 3.4* 3.8 3.9 3.7 3.7 3.6 3.8 4.1 3.8  CL 100 101 100 100 101 100 101 99 100 103   CO2 22 26 25 26 25   --   --   --   --   --   GLUCOSE 266* 181* 179* 211* 193* 151* 151* 134* 165* 219*  BUN 7 6 6  12 13 10 10 9 9 9   CREATININE 1.02 1.02 0.97 1.04 0.88 0.80 0.80 0.60* 0.60* 0.70  CALCIUM 8.4* 8.1* 8.9 8.6* 8.4*  --   --   --   --   --    GFR: Estimated Creatinine Clearance: 165.3 mL/min (by C-G formula based on SCr of 0.7 mg/dL). Liver Function Tests: Recent Labs  Lab 08/23/18 1843  AST 17  ALT 22  ALKPHOS 41  BILITOT 0.3  PROT 6.6  ALBUMIN 2.8*   No results for input(s): LIPASE, AMYLASE in the last 168 hours. No results for input(s): AMMONIA in the last 168 hours. Coagulation Profile: Recent Labs  Lab 08/20/18 0345 08/21/18 0229 08/22/18 0315 08/23/18 1843 08/24/18 1530  INR 1.07 1.08 1.06 1.02 1.18   Cardiac Enzymes: Recent Labs  Lab 08/18/18 1808  TROPONINI 0.04*   BNP (last 3 results) No results for input(s): PROBNP in the last 8760 hours. HbA1C: Recent Labs    08/23/18 1843  HGBA1C 9.6*   CBG: Recent Labs  Lab 08/23/18 0614 08/23/18 1122  08/23/18 1651 08/23/18 2057 08/24/18 0605  GLUCAP 174* 209* 189* 183* 142*   Lipid Profile: No results for input(s): CHOL, HDL, LDLCALC, TRIG, CHOLHDL, LDLDIRECT in the last 72 hours. Thyroid Function Tests: No results for input(s): TSH, T4TOTAL, FREET4, T3FREE, THYROIDAB in the last 72 hours. Anemia Panel: No results for input(s): VITAMINB12, FOLATE, FERRITIN, TIBC, IRON, RETICCTPCT in the last 72 hours. Sepsis Labs: No results for input(s): PROCALCITON, LATICACIDVEN in the last 168 hours.  Recent Results (from the past 240 hour(s))  Culture, sputum-assessment     Status: None   Collection Time: 08/18/18 10:19 PM  Result Value Ref Range Status   Specimen Description EXPECTORATED SPUTUM  Final   Special Requests Normal  Final   Sputum evaluation   Final    THIS SPECIMEN IS ACCEPTABLE FOR SPUTUM CULTURE Performed at [facility], [address] [place], [place] [address]    Report Status 08/19/2018 FINAL  Final  Culture, respiratory     Status: None   Collection Time: 08/18/18 10:19 PM  Result Value Ref Range Status   Specimen Description EXPECTORATED SPUTUM  Final   Special Requests Normal Reflexed from [id number]  Final   Gram Stain   Final    RARE WBC PRESENT, PREDOMINANTLY PMN RARE GRAM POSITIVE RODS Performed at [facility], [address], [place], [place] [address]    Culture FEW Consistent with normal respiratory flora.  Final   Report Status 08/21/2018 FINAL  Final  MRSA PCR Screening     Status: None   Collection Time: 08/23/18  8:14 PM  Result Value Ref Range Status   MRSA by PCR NEGATIVE NEGATIVE Final    Comment:        The GeneXpert MRSA Assay (FDA approved for NASAL specimens only), is one component of a comprehensive MRSA colonization surveillance program. It is not intended to diagnose MRSA infection nor to guide or monitor treatment for MRSA infections. Performed at [facility], [address] [place], [place] [address]     Aerobic/Anaerobic Culture (surgical/deep wound)     Status: None (Preliminary result)   Collection Time: 08/24/18 12:34 PM  Result Value Ref Range Status   Specimen Description TISSUE LEFT,VENTRICLE MASS  Final   Special Requests NONE  Final   Gram Stain   Final    FEW WBC PRESENT, PREDOMINANTLY PMN NO ORGANISMS SEEN Performed at [facility],  [address], [place], [place] [address]    Culture PENDING  Incomplete   Report Status PENDING  Incomplete         Radiology Studies: Dg Chest Port 1 View  Result Date: 08/24/2018 CLINICAL DATA:  Status post coronary artery bypass graft. EXAM: PORTABLE CHEST 1 VIEW COMPARISON:  Radiographs of August 17, 2018. FINDINGS: Endotracheal and nasogastric tubes are in grossly good position. Right internal jugular Swan-Ganz catheter is noted with tip directed toward right pulmonary artery. Left-sided chest tube is noted without pneumothorax. Right apical fluid collection is noted. Mild left upper lobe subsegmental atelectasis is noted. Bony thorax is unremarkable. IMPRESSION: Endotracheal and nasogastric tubes in grossly good position. Left-sided chest tube is noted without pneumothorax. Mild left upper lobe subsegmental atelectasis. Right apical fluid is noted which is new since prior exam; mediastinal hemorrhage can not be excluded. Electronically Signed   By: Lupita Raider, M.D.   On: 08/24/2018 16:11        Scheduled Meds: . [START ON 08/25/2018] acetaminophen  1,000 mg Oral Q6H   Or  . [START ON 08/25/2018] acetaminophen (TYLENOL) oral liquid 160 mg/5 mL  1,000 mg Per Tube Q6H  . [START ON 08/25/2018] aspirin EC  325 mg Oral Daily   Or  . [START ON 08/25/2018] aspirin  324 mg Per Tube Daily  . [START ON 08/25/2018] bisacodyl  10 mg Oral Daily   Or  . [START ON 08/25/2018] bisacodyl  10 mg Rectal Daily  . [START ON 08/25/2018] docusate sodium  200 mg Oral Daily  . insulin regular  0-10 Units Intravenous TID WC  .  metoCLOPramide (REGLAN) injection  10 mg Intravenous Q6H  . metoprolol tartrate  12.5 mg Oral BID   Or  . metoprolol tartrate  12.5 mg Per Tube BID  . [START ON 08/26/2018] pantoprazole  40 mg Oral Daily  . [START ON 08/25/2018] sodium chloride flush  3 mL Intravenous Q12H   Continuous Infusions: . sodium chloride Stopped (08/24/18 1632)  . [START ON 08/25/2018] sodium chloride    . sodium chloride 20 mL/hr at 08/24/18 1545  . albumin human    . cefUROXime (ZINACEF)  IV    . dexmedetomidine (PRECEDEX) IV infusion 0.7 mcg/kg/hr (08/24/18 1700)  . DOPamine 3 mcg/kg/min (08/24/18 1700)  . famotidine (PEPCID) IV 20 mg (08/24/18 1545)  . insulin 6.2 mL/hr at 08/24/18 1700  . lactated ringers    . lactated ringers    . lactated ringers 20 mL/hr at 08/24/18 1700  . magnesium sulfate 20 mL/hr at 08/24/18 1700  . milrinone 0.3 mcg/kg/min (08/24/18 1700)  . nitroGLYCERIN    . phenylephrine (NEO-SYNEPHRINE) Adult infusion 20 mcg/min (08/24/18 1700)  . potassium chloride 50 mL/hr at 08/24/18 1633  . vancomycin       LOS: 5 days    Time spent: 35 mins.More than 50% of that time was spent in counseling and/or coordination of care.      Burnadette Pop, MD [facility] Pager [phone]  If 7PM-7AM, please contact night-coverage [email or web address] Password [facility] 08/24/2018, 5:24 PM

## 2018-08-24 NOTE — Brief Op Note (Signed)
      [address]       [place] [id number]             [phone]   08/24/2018  3:24 PM  PATIENT:  Marcus SpillerWilliam L Picchi Jr.  47 y.o. male  PRE-OPERATIVE DIAGNOSIS:  LV CLOT AND CAD   POST-OPERATIVE DIAGNOSIS:  LV CLOT AND CAD   PROCEDURE:  Procedure(s):  CORONARY ARTERY BYPASS GRAFTING x 2 -LIMA to LAD -SVG to DIAGONAL  RESECTION OF LV THROMBUS  ENDOVEIN HARVEST OF GREATER SAPHENOUS VEIN  -Right Thigh  TRANSESOPHAGEAL ECHOCARDIOGRAM (TEE) (N/A)   SURGEON:  Surgeon(s) and Role:    * Delight OvensGerhardt, Siennah Barrasso B, MD - Primary  PHYSICIAN ASSISTANT: Lowella DandyErin Barrett PA-C  ANESTHESIA:   general  EBL:  400 mL   BLOOD ADMINISTERED: CELLSAVER  DRAINS: Left Pleural Chest Tubes, Mediastinal Chest Drains   LOCAL MEDICATIONS USED:  NONE  SPECIMEN:  Source of Specimen:  LV Thrombus  DISPOSITION OF SPECIMEN:  PATHOLOGY  COUNTS:  YES  DICTATION: .Dragon Dictation  PLAN OF CARE: Admit to inpatient   PATIENT DISPOSITION:  ICU - intubated and hemodynamically stable.   Delay start of Pharmacological VTE agent (>24hrs) due to surgical blood loss or risk of bleeding: yes

## 2018-08-24 NOTE — Procedures (Signed)
Extubation Procedure Note  Patient Details:   Name: Levonne SpillerWilliam L Kryder Jr. DOB: 01-Oct-1970 MRN: [id number]   Airway Documentation:  Airway 8 mm (Active)  Secured at (cm) 24 cm 08/24/2018  3:50 PM  Measured From Lips 08/24/2018  4:00 PM  Secured Location Right 08/24/2018  4:00 PM  Secured By Pink Tape 08/24/2018  4:00 PM  Site Condition Dry 08/24/2018  4:00 PM   Vent end date: (not recorded) Vent end time: (not recorded)   Evaluation  O2 sats: stable throughout Complications: No apparent complications Patient did tolerate procedure well. Bilateral Breath Sounds: Rhonchi, Diminished   Yes   Patient had a positive cuff leak VC .95 L NIF -40 After extubation was able to say his name No apparent complications at this time   Benson SettingMorgan R Kermit Arnette 08/24/2018, 7:58 PM

## 2018-08-24 NOTE — Anesthesia Procedure Notes (Addendum)
Procedure Name: Intubation Date/Time: 08/24/2018 10:04 AM Performed by: Jodell Ciproato, Melburn Treiber A, CRNA Pre-anesthesia Checklist: Patient identified, Emergency Drugs available, Suction available and Patient being monitored Patient Re-evaluated:Patient Re-evaluated prior to induction Oxygen Delivery Method: Circle System Utilized Preoxygenation: Pre-oxygenation with 100% oxygen Induction Type: IV induction Ventilation: Mask ventilation with difficulty and Oral airway inserted - appropriate to patient size Laryngoscope Size: Glidescope and 4 Grade View: Grade I Tube type: Oral Tube size: 8.0 mm Number of attempts: 1 Airway Equipment and Method: Oral airway,  Video-laryngoscopy and Rigid stylet Placement Confirmation: ETT inserted through vocal cords under direct vision,  positive ETCO2 and breath sounds checked- equal and bilateral Secured at: 22 (teeth) cm Tube secured with: Tape Dental Injury: Teeth and Oropharynx as per pre-operative assessment  Difficulty Due To: Difficulty was anticipated Comments: Hx morbid obesity. Glidescope used without issue. 2-handed mask ventilation with oral airway.

## 2018-08-24 NOTE — Transfer of Care (Signed)
Immediate Anesthesia Transfer of Care Note  Patient: Marcus SpillerWilliam L Vanterpool Jr.  Procedure(s) Performed: LEFT VENTRICULATOMY WITH REMOVAL OF LV CLOT,  CORONARY ARTERY BYPASS GRAFTING (CABG)  times TWO USING LEFT INTERNAL MAMMARY ARTERY TO LAD AND VEIN GRAFT TO 1ST DIAG. (N/A Chest) TRANSESOPHAGEAL ECHOCARDIOGRAM (TEE) (N/A ) ENDOVEIN HARVEST OF GREATER SAPHENOUS VEIN (Right Leg Lower)  Patient Location: SICU  Anesthesia Type:General  Level of Consciousness: sedated, unresponsive and Patient remains intubated per anesthesia plan  Airway & Oxygen Therapy: Patient remains intubated per anesthesia plan and Patient placed on Ventilator (see vital sign flow sheet for setting)  Post-op Assessment: Report given to RN and Post -op Vital signs reviewed and stable  Post vital signs: Reviewed and stable  Last Vitals:  Vitals Value Taken Time  BP 82/58 08/24/2018  3:56 PM  Temp 36 C 08/24/2018  3:58 PM  Pulse 99 08/24/2018  3:58 PM  Resp 18 08/24/2018  3:58 PM  SpO2 95 % 08/24/2018  3:58 PM  Vitals shown include unvalidated device data.  Last Pain:  Vitals:   08/24/18 0459  TempSrc: Oral  PainSc:       Patients Stated Pain Goal: 0 (08/20/18 0415)  Complications: No apparent anesthesia complications

## 2018-08-24 NOTE — Anesthesia Postprocedure Evaluation (Signed)
Anesthesia Post Note  Patient: Levonne SpillerWilliam L Audino Jr.  Procedure(s) Performed: LEFT VENTRICULATOMY WITH REMOVAL OF LV CLOT,  CORONARY ARTERY BYPASS GRAFTING (CABG)  times TWO USING LEFT INTERNAL MAMMARY ARTERY TO LAD AND VEIN GRAFT TO 1ST DIAG. (N/A Chest) TRANSESOPHAGEAL ECHOCARDIOGRAM (TEE) (N/A ) ENDOVEIN HARVEST OF GREATER SAPHENOUS VEIN (Right Leg Lower)     Patient location during evaluation: SICU Anesthesia Type: General Level of consciousness: awake Pain management: pain level controlled Vital Signs Assessment: post-procedure vital signs reviewed and stable Respiratory status: spontaneous breathing, nonlabored ventilation, respiratory function stable and patient connected to nasal cannula oxygen Cardiovascular status: blood pressure returned to baseline and stable Postop Assessment: no apparent nausea or vomiting Anesthetic complications: no    Last Vitals:  Vitals:   08/24/18 2045 08/24/18 2100  BP:  (!) 83/54  Pulse: 91 83  Resp: 20 (!) 26  Temp: (!) 36.3 C (!) 36.3 C  SpO2: 96% 97%    Last Pain:  Vitals:   08/24/18 2100  TempSrc: Core (Comment)  PainSc:                  Catheryn Baconyan P Ellender

## 2018-08-24 NOTE — Progress Notes (Signed)
Patient in OR this am with CT surgery for CABG and removal of LV thrombus.  Last seen yesterday with palpable R DP pulse and right groin incision c/d/i.  No new issues at this time.  Cephus Shellinghristopher J. Kaelob Persky, MD [facility] [place] Office: [phone] Pager: [phone]  Cephus Shellinghristopher J Osmany Azer

## 2018-08-24 NOTE — Anesthesia Procedure Notes (Signed)
Arterial Line Insertion Start/End12/27/2019 7:30 AM, 08/24/2018 7:45 AM Performed by: Jodell Ciproato, Luka Stohr A, CRNA, CRNA  Patient location: Pre-op. Preanesthetic checklist: patient identified, IV checked, site marked, risks and benefits discussed, surgical consent, monitors and equipment checked, pre-op evaluation, timeout performed and anesthesia consent Lidocaine 1% used for infiltration and patient sedated Left, radial was placed Catheter size: 20 G Hand hygiene performed  and maximum sterile barriers used  Allen's test indicative of satisfactory collateral circulation Attempts: 1 Procedure performed without using ultrasound guided technique. Following insertion, Biopatch and dressing applied. Post procedure assessment: normal  Patient tolerated the procedure well with no immediate complications.

## 2018-08-24 NOTE — Anesthesia Procedure Notes (Signed)
Central Venous Catheter Insertion Performed by: Myrtie Soman, MD, anesthesiologist Start/End12/27/2019 6:50 AM, 08/24/2018 7:15 AM Patient location: Pre-op. Preanesthetic checklist: patient identified, IV checked, site marked, risks and benefits discussed, surgical consent, monitors and equipment checked, pre-op evaluation, timeout performed and anesthesia consent Position: Trendelenburg Lidocaine 1% used for infiltration and patient sedated Hand hygiene performed  and maximum sterile barriers used  Catheter size: 9 Fr PA cath was placed.MAC introducer Swan type:thermodilution PA Cath depth:46 Procedure performed using ultrasound guided technique. Ultrasound Notes:anatomy identified, needle tip was noted to be adjacent to the nerve/plexus identified, no ultrasound evidence of intravascular and/or intraneural injection and image(s) printed for medical record Attempts: 1 Following insertion, line sutured, dressing applied and Biopatch. Post procedure assessment: blood return through all ports, free fluid flow and no air  Patient tolerated the procedure well with no immediate complications. Additional procedure comments: Incision made on neck in order to facilitate passage to .

## 2018-08-24 NOTE — Anesthesia Procedure Notes (Signed)
Anesthesia Procedure Image    

## 2018-08-24 NOTE — Progress Notes (Signed)
      [address]       [place] [id number]             [phone]     Pre Procedure note for inpatients:   Marcus SpillerWilliam L Labrosse Jr. has been scheduled for Procedure(s): CORONARY ARTERY BYPASS GRAFTING (CABG) with ventriculatomy with removal of LV clot and endovein harvest (N/A) TRANSESOPHAGEAL ECHOCARDIOGRAM (TEE) (N/A) today. The various methods of treatment have been discussed with the patient. After consideration of the risks, benefits and treatment options the patient has consented to the planned procedure.   The patient has been seen and labs reviewed. There are no changes in the patient's condition to prevent proceeding with the planned procedure today.  Recent labs:  Lab Results  Component Value Date   WBC 11.3 (H) 08/24/2018   HGB 12.2 (L) 08/24/2018   HCT 36.2 (L) 08/24/2018   PLT 391 08/24/2018   GLUCOSE 193 (H) 08/24/2018   ALT 22 08/23/2018   AST 17 08/23/2018   NA 136 08/24/2018   K 3.7 08/24/2018   CL 101 08/24/2018   CREATININE 0.88 08/24/2018   BUN 13 08/24/2018   CO2 25 08/24/2018   INR 1.02 08/23/2018   HGBA1C 9.6 (H) 08/23/2018    Marcus OvensEdward B Ebrahim Deremer, MD 08/24/2018 7:21 AM

## 2018-08-24 NOTE — Progress Notes (Signed)
Echocardiogram Transesophageal has been performed.  Marcus Joseph 08/24/2018, 10:35 AM

## 2018-08-25 ENCOUNTER — Inpatient Hospital Stay (HOSPITAL_COMMUNITY): Payer: Self-pay

## 2018-08-25 DIAGNOSIS — Z951 Presence of aortocoronary bypass graft: Secondary | ICD-10-CM

## 2018-08-25 LAB — CBC
HCT: 32.6 % — ABNORMAL LOW (ref 39.0–52.0)
HCT: 32.4 % — ABNORMAL LOW (ref 39.0–52.0)
HEMATOCRIT: 32.7 % — AB (ref 39.0–52.0)
Hemoglobin: 10.8 g/dL — ABNORMAL LOW (ref 13.0–17.0)
Hemoglobin: 10.6 g/dL — ABNORMAL LOW (ref 13.0–17.0)
Hemoglobin: 10.4 g/dL — ABNORMAL LOW (ref 13.0–17.0)
MCH: 27.9 pg (ref 26.0–34.0)
MCH: 27.1 pg (ref 26.0–34.0)
MCH: 27.4 pg (ref 26.0–34.0)
MCHC: 33.1 g/dL (ref 30.0–36.0)
MCHC: 32.4 g/dL (ref 30.0–36.0)
MCHC: 32.1 g/dL (ref 30.0–36.0)
MCV: 84.2 fL (ref 80.0–100.0)
MCV: 83.6 fL (ref 80.0–100.0)
MCV: 85.5 fL (ref 80.0–100.0)
Platelets: 305 10*3/uL (ref 150–400)
Platelets: 298 10*3/uL (ref 150–400)
Platelets: 310 10*3/uL (ref 150–400)
RBC: 3.87 MIL/uL — ABNORMAL LOW (ref 4.22–5.81)
RBC: 3.91 MIL/uL — ABNORMAL LOW (ref 4.22–5.81)
RBC: 3.79 MIL/uL — ABNORMAL LOW (ref 4.22–5.81)
RDW: 13.2 % (ref 11.5–15.5)
RDW: 13.2 % (ref 11.5–15.5)
RDW: 13.4 % (ref 11.5–15.5)
WBC: 19.3 10*3/uL — ABNORMAL HIGH (ref 4.0–10.5)
WBC: 19.2 10*3/uL — ABNORMAL HIGH (ref 4.0–10.5)
WBC: 20.5 10*3/uL — ABNORMAL HIGH (ref 4.0–10.5)
nRBC: 0 % (ref 0.0–0.2)
nRBC: 0 % (ref 0.0–0.2)
nRBC: 0 % (ref 0.0–0.2)

## 2018-08-25 LAB — BASIC METABOLIC PANEL
Anion gap: 8 (ref 5–15)
BUN: 7 mg/dL (ref 6–20)
CO2: 24 mmol/L (ref 22–32)
Calcium: 8.1 mg/dL — ABNORMAL LOW (ref 8.9–10.3)
Chloride: 103 mmol/L (ref 98–111)
Creatinine, Ser: 0.77 mg/dL (ref 0.61–1.24)
GFR calc Af Amer: >60 mL/min (ref >60)
GFR calc non Af Amer: >60 mL/min (ref >60)
Glucose, Bld: 114 mg/dL — ABNORMAL HIGH (ref 70–99)
Potassium: 4 mmol/L (ref 3.5–5.1)
Sodium: 135 mmol/L (ref 135–145)

## 2018-08-25 LAB — GLUCOSE, CAPILLARY
GLUCOSE-CAPILLARY: 103 mg/dL — AB (ref 70–99)
GLUCOSE-CAPILLARY: 117 mg/dL — AB (ref 70–99)
Glucose-Capillary: 101 mg/dL — ABNORMAL HIGH (ref 70–99)
Glucose-Capillary: 106 mg/dL — ABNORMAL HIGH (ref 70–99)
Glucose-Capillary: 107 mg/dL — ABNORMAL HIGH (ref 70–99)
Glucose-Capillary: 108 mg/dL — ABNORMAL HIGH (ref 70–99)
Glucose-Capillary: 110 mg/dL — ABNORMAL HIGH (ref 70–99)
Glucose-Capillary: 111 mg/dL — ABNORMAL HIGH (ref 70–99)
Glucose-Capillary: 128 mg/dL — ABNORMAL HIGH (ref 70–99)
Glucose-Capillary: 133 mg/dL — ABNORMAL HIGH (ref 70–99)
Glucose-Capillary: 190 mg/dL — ABNORMAL HIGH (ref 70–99)
Glucose-Capillary: 231 mg/dL — ABNORMAL HIGH (ref 70–99)
Glucose-Capillary: 93 mg/dL (ref 70–99)

## 2018-08-25 LAB — POCT I-STAT, CHEM 8
BUN: 11 mg/dL (ref 6–20)
Calcium, Ion: 1.14 mmol/L — ABNORMAL LOW (ref 1.15–1.40)
Chloride: 97 mmol/L — ABNORMAL LOW (ref 98–111)
Creatinine, Ser: 1.2 mg/dL (ref 0.61–1.24)
Glucose, Bld: 228 mg/dL — ABNORMAL HIGH (ref 70–99)
HCT: 32 % — ABNORMAL LOW (ref 39.0–52.0)
Hemoglobin: 10.9 g/dL — ABNORMAL LOW (ref 13.0–17.0)
Potassium: 4.2 mmol/L (ref 3.5–5.1)
Sodium: 132 mmol/L — ABNORMAL LOW (ref 135–145)
TCO2: 26 mmol/L (ref 22–32)

## 2018-08-25 LAB — CREATININE, SERUM
Creatinine, Ser: 0.92 mg/dL (ref 0.61–1.24)
Creatinine, Ser: 1.31 mg/dL — ABNORMAL HIGH (ref 0.61–1.24)
GFR calc Af Amer: >60 mL/min (ref >60)
GFR calc Af Amer: >60 mL/min (ref >60)
GFR calc non Af Amer: >60 mL/min (ref >60)
GFR calc non Af Amer: >60 mL/min (ref >60)

## 2018-08-25 LAB — ACID FAST SMEAR (AFB, MYCOBACTERIA): Acid Fast Smear: NEGATIVE

## 2018-08-25 LAB — MAGNESIUM
Magnesium: 2.7 mg/dL — ABNORMAL HIGH (ref 1.7–2.4)
Magnesium: 2.5 mg/dL — ABNORMAL HIGH (ref 1.7–2.4)
Magnesium: 2.2 mg/dL (ref 1.7–2.4)

## 2018-08-25 MED ORDER — INSULIN DETEMIR 100 UNIT/ML ~~LOC~~ SOLN: 20.0000 [IU] | Freq: Every day | SUBCUTANEOUS | Status: DC

## 2018-08-25 MED ORDER — HYDROMORPHONE HCL 2 MG PO TABS
2.0000 mg | ORAL_TABLET | ORAL | Status: DC | PRN
  Administered 2018-08-25 – 2018-08-31 (×18): 2 mg via ORAL
  Filled 2018-08-25 (×19): qty 1

## 2018-08-25 MED ORDER — INSULIN ASPART 100 UNIT/ML ~~LOC~~ SOLN
3.0000 [IU] | Freq: Three times a day (TID) | SUBCUTANEOUS | Status: DC
  Administered 2018-08-25 – 2018-08-26 (×2): 3 [IU] via SUBCUTANEOUS

## 2018-08-25 MED ORDER — PANTOPRAZOLE SODIUM 40 MG PO TBEC
40.0000 mg | DELAYED_RELEASE_TABLET | Freq: Every day | ORAL | Status: DC
  Administered 2018-08-25 – 2018-09-02 (×9): 40 mg via ORAL
  Filled 2018-08-25 (×9): qty 1

## 2018-08-25 MED ORDER — INSULIN DETEMIR 100 UNIT/ML ~~LOC~~ SOLN
20.0000 [IU] | Freq: Every day | SUBCUTANEOUS | Status: DC
  Administered 2018-08-25: 20 [IU] via SUBCUTANEOUS
  Filled 2018-08-25 (×2): qty 0.2

## 2018-08-25 MED ORDER — INSULIN ASPART 100 UNIT/ML ~~LOC~~ SOLN
0.0000 [IU] | SUBCUTANEOUS | Status: DC
  Administered 2018-08-25: 4 [IU] via SUBCUTANEOUS
  Administered 2018-08-25 (×2): 8 [IU] via SUBCUTANEOUS
  Administered 2018-08-26 (×2): 4 [IU] via SUBCUTANEOUS

## 2018-08-25 NOTE — Progress Notes (Signed)
  Progress Note    08/25/2018 10:16 AM 1 Day Post-Op  Subjective:  Says leg is fine but his chest hurts    Vitals:   08/25/18 0915 08/25/18 0930  BP:  (!) 90/49  Pulse: (!) 107 (!) 103  Resp: (!) 24 14  Temp:    SpO2: 97% 96%    Physical Exam: Cardiac:  regular Lungs:  Non labored Incisions:  Right groin is clean and dry Extremities:  Brisk DP doppler signal right    CBC    Component Value Date/Time   WBC 19.2 (H) 08/25/2018 0429   RBC 3.91 (L) 08/25/2018 0429   HGB 10.6 (L) 08/25/2018 0429   HCT 32.7 (L) 08/25/2018 0429   PLT 298 08/25/2018 0429   MCV 83.6 08/25/2018 0429   MCH 27.1 08/25/2018 0429   MCHC 32.4 08/25/2018 0429   RDW 13.2 08/25/2018 0429   LYMPHSABS 3.5 08/20/2018 0345   MONOABS 0.9 08/20/2018 0345   EOSABS 0.1 08/20/2018 0345   BASOSABS 0.0 08/20/2018 0345    BMET    Component Value Date/Time   NA 135 08/25/2018 0429   K 4.0 08/25/2018 0429   CL 103 08/25/2018 0429   CO2 24 08/25/2018 0429   GLUCOSE 114 (H) 08/25/2018 0429   BUN 7 08/25/2018 0429   CREATININE 0.77 08/25/2018 0429   CALCIUM 8.1 (L) 08/25/2018 0429   GFRNONAA >60 08/25/2018 0429   GFRAA >60 08/25/2018 0429    INR    Component Value Date/Time   INR 1.18 08/24/2018 1530     Intake/Output Summary (Last 24 hours) at 08/25/2018 1016 Last data filed at 08/25/2018 0900 Gross per 24 hour  Intake 5919.05 ml  Output 4068 ml  Net 1851.05 ml     Assessment:  47 y.o. male is s/p:  1.  Right iliac, femoral, popliteal, and tibial thromboembolectomy via right femoral cutdown 2. 19 Fr Blake drain placed in the right groin  7 days Post-Op And CABG x 2 and resection of LV thrombus 1 Day Post-Op  Plan: -pt with brisk right DP doppler signal -dry gauze placed in right groin.  Discussed groin wound care with pt and family member and he expressed understanding.    [name], PA-C [facility] [phone] 08/25/2018 10:16 AM

## 2018-08-25 NOTE — Progress Notes (Signed)
1 Day Post-Op Procedure(s) (LRB): LEFT VENTRICULATOMY WITH REMOVAL OF LV CLOT,  CORONARY ARTERY BYPASS GRAFTING (CABG)  times TWO USING LEFT INTERNAL MAMMARY ARTERY TO LAD AND VEIN GRAFT TO 1ST DIAG. (N/A) TRANSESOPHAGEAL ECHOCARDIOGRAM (TEE) (N/A) ENDOVEIN HARVEST OF GREATER SAPHENOUS VEIN (Right) Subjective:  Complains of expected postop pain.  Objective: Vital signs in last 24 hours: Temp:  [96.6 F (35.9 C)-99 F (37.2 C)] 99 F (37.2 C) (12/28 0800) Pulse Rate:  [83-103] 102 (12/28 0800) Cardiac Rhythm: Sinus tachycardia (12/28 0800) Resp:  [12-32] 21 (12/28 0800) BP: (82-155)/(40-75) 88/40 (12/28 0800) SpO2:  [93 %-100 %] 98 % (12/28 0800) Arterial Line BP: (100-180)/(47-88) 104/55 (12/28 0800) FiO2 (%):  [40 %-50 %] 40 % (12/27 1903) Weight:  [142.5 kg] 142.5 kg (12/28 0500)  Hemodynamic parameters for last 24 hours: PAP: (23-36)/(11-28) 30/26 CO:  [5.2 L/min-6.6 L/min] 6.6 L/min CI:  [2 L/min/m2-2.6 L/min/m2] 2.6 L/min/m2  Intake/Output from previous day: 12/27 0701 - 12/28 0700 In: 5719.7 [I.V.:4362.6; Blood:200; IV Piggyback:1157.1] Out: 3933 [Urine:3178; Blood:400; Chest Tube:330] Intake/Output this shift: Total I/O In: 123 [P.O.:60; I.V.:63] Out: 115 [Urine:85; Chest Tube:30]  General appearance: alert and cooperative Neurologic: intact Heart: regular rate and rhythm, S1, S2 normal, no murmur, click, rub or gallop Lungs: clear to auscultation bilaterally Extremities: mild edema Wound: dressings dry  Lab Results: Recent Labs    08/24/18 2257 08/25/18 0429  WBC 19.3* 19.2*  HGB 10.8* 10.6*  HCT 32.6* 32.7*  PLT 305 298   BMET:  Recent Labs    08/24/18 0403  08/24/18 2248 08/24/18 2257 08/25/18 0429  NA 136   < > 136  --  135  K 3.7   < > 4.1  --  4.0  CL 101   < > 103  --  103  CO2 25  --   --   --  24  GLUCOSE 193*   < > 142*  --  114*  BUN 13   < > 9  --  7  CREATININE 0.88   < > 0.80 0.92 0.77  CALCIUM 8.4*  --   --   --  8.1*   < > =  values in this interval not displayed.    PT/INR:  Recent Labs    08/24/18 1530  LABPROT 14.9  INR 1.18   ABG    Component Value Date/Time   PHART 7.414 08/24/2018 2104   HCO3 24.3 08/24/2018 2104   TCO2 25 08/24/2018 2248   ACIDBASEDEF 1.0 08/24/2018 1930   O2SAT 95.0 08/24/2018 2104   CBG (last 3)  Recent Labs    08/25/18 0425 08/25/18 0544 08/25/18 0644  GLUCAP 111* 93 107*   CXR: ok  ECG: sinus, no acute changes  Assessment/Plan: S/P Procedure(s) (LRB): LEFT VENTRICULATOMY WITH REMOVAL OF LV CLOT,  CORONARY ARTERY BYPASS GRAFTING (CABG)  times TWO USING LEFT INTERNAL MAMMARY ARTERY TO LAD AND VEIN GRAFT TO 1ST DIAG. (N/A) TRANSESOPHAGEAL ECHOCARDIOGRAM (TEE) (N/A) ENDOVEIN HARVEST OF GREATER SAPHENOUS VEIN (Right) Hemodynamically stable. EF 35%. Wean dopamine and milrinone slowly. DC beta blocker until off inotropes. Mobilize Diuresis  Diabetes control: start levemir and SSI, meal coverage. Poorly controlled preop. d/c tubes/lines Continue foley due to patient in ICU and urinary output monitoring See progression orders   LOS: 6 days    Alleen BorneBryan K Lacrecia Delval 08/25/2018

## 2018-08-25 NOTE — Plan of Care (Signed)
  Problem: Activity: Goal: Risk for activity intolerance will decrease Outcome: Progressing   Problem: Respiratory: Goal: Respiratory status will improve Outcome: Progressing   Patient now on RA and tolerating well. Ambulated around entire unit 370 feet with walker and 1 person assist.

## 2018-08-25 NOTE — Plan of Care (Signed)
  Problem: Education: Goal: Knowledge of General Education information will improve Description Including pain rating scale, medication(s)/side effects and non-pharmacologic comfort measures Outcome: Progressing   Problem: Health Behavior/Discharge Planning: Goal: Ability to manage health-related needs will improve Outcome: Progressing   Problem: Clinical Measurements: Goal: Ability to maintain clinical measurements within normal limits will improve Outcome: Progressing Goal: Will remain free from infection Outcome: Progressing Goal: Diagnostic test results will improve Outcome: Progressing Goal: Respiratory complications will improve Outcome: Progressing Goal: Cardiovascular complication will be avoided Outcome: Progressing   Problem: Activity: Goal: Risk for activity intolerance will decrease Outcome: Progressing   Problem: Coping: Goal: Level of anxiety will decrease Outcome: Progressing   Problem: Elimination: Goal: Will not experience complications related to bowel motility Outcome: Progressing Goal: Will not experience complications related to urinary retention Outcome: Progressing   Problem: Pain Managment: Goal: General experience of comfort will improve Outcome: Progressing   Problem: Safety: Goal: Ability to remain free from injury will improve Outcome: Progressing   Problem: Activity: Goal: Ability to return to baseline activity level will improve Outcome: Progressing   Problem: Cardiovascular: Goal: Ability to achieve and maintain adequate cardiovascular perfusion will improve Outcome: Progressing Goal: Vascular access site(s) Level 0-1 will be maintained Outcome: Progressing   Problem: Health Behavior/Discharge Planning: Goal: Ability to safely manage health-related needs after discharge will improve Outcome: Progressing   Problem: Education: Goal: Knowledge of disease or condition will improve Outcome: Progressing Goal: Knowledge of the  prescribed therapeutic regimen will improve Outcome: Progressing   Problem: Activity: Goal: Risk for activity intolerance will decrease Outcome: Progressing   Problem: Cardiac: Goal: Will achieve and/or maintain hemodynamic stability Outcome: Progressing   Problem: Clinical Measurements: Goal: Postoperative complications will be avoided or minimized Outcome: Progressing   Problem: Respiratory: Goal: Respiratory status will improve Outcome: Progressing   Problem: Skin Integrity: Goal: Wound healing without signs and symptoms of infection Outcome: Progressing Goal: Risk for impaired skin integrity will decrease Outcome: Progressing   Problem: Urinary Elimination: Goal: Ability to achieve and maintain adequate renal perfusion and functioning will improve Outcome: Progressing

## 2018-08-25 NOTE — Progress Notes (Signed)
Progress Note  Patient Name: Marcus Joseph Jr. Date of Encounter: 08/25/2018  Primary Cardiologist:   Olga MillersBrian Crenshaw, MD   Subjective   Awake and extubated.  Usual post op pain.   Inpatient Medications    Scheduled Meds: . acetaminophen  1,000 mg Oral Q6H   Or  . acetaminophen (TYLENOL) oral liquid 160 mg/5 mL  1,000 mg Per Tube Q6H  . aspirin EC  325 mg Oral Daily   Or  . aspirin  324 mg Per Tube Daily  . bisacodyl  10 mg Oral Daily   Or  . bisacodyl  10 mg Rectal Daily  . docusate sodium  200 mg Oral Daily  . insulin regular  0-10 Units Intravenous TID WC  . mouth rinse  15 mL Mouth Rinse BID  . metoCLOPramide (REGLAN) injection  10 mg Intravenous Q6H  . metoprolol tartrate  12.5 mg Oral BID   Or  . metoprolol tartrate  12.5 mg Per Tube BID  . [START ON 08/26/2018] pantoprazole  40 mg Oral Daily  . sodium chloride flush  3 mL Intravenous Q12H   Continuous Infusions: . sodium chloride 20 mL/hr at 08/25/18 0700  . sodium chloride    . sodium chloride 20 mL/hr at 08/24/18 1545  . albumin human 12.5 g (08/24/18 1819)  . cefUROXime (ZINACEF)  IV Stopped (08/25/18 0610)  . dexmedetomidine (PRECEDEX) IV infusion Stopped (08/24/18 1932)  . DOPamine 3 mcg/kg/min (08/25/18 0700)  . famotidine (PEPCID) IV 20 mg (08/24/18 1545)  . insulin 4.5 mL/hr at 08/25/18 0700  . lactated ringers    . lactated ringers    . lactated ringers 20 mL/hr at 08/25/18 0700  . milrinone 0.3 mcg/kg/min (08/25/18 0700)  . nitroGLYCERIN Stopped (08/24/18 1758)  . phenylephrine (NEO-SYNEPHRINE) Adult infusion Stopped (08/25/18 0607)   PRN Meds: sodium chloride, albumin human, lactated ringers, metoprolol tartrate, midazolam, morphine injection, ondansetron (ZOFRAN) IV, oxyCODONE, sodium chloride flush, traMADol   Vital Signs    Vitals:   08/25/18 0630 08/25/18 0645 08/25/18 0700 08/25/18 0745  BP:   (!) 89/47   Pulse: 96 100 99 100  Resp:  17 12 (!) 24  Temp: 98.8 F (37.1 C) 98.8  F (37.1 C) 98.8 F (37.1 C) 98.8 F (37.1 C)  TempSrc:      SpO2: 96% 93% 96% 96%  Weight:      Height:        Intake/Output Summary (Last 24 hours) at 08/25/2018 0806 Last data filed at 08/25/2018 0700 Gross per 24 hour  Intake 5719.68 ml  Output 3933 ml  Net 1786.68 ml   Filed Weights   08/21/18 0037 08/25/18 0500  Weight: (!) 140.3 kg (!) 142.5 kg    Telemetry    NSR, PVCs, ventricular triplet. - Personally Reviewed  ECG    NA - Personally Reviewed  Physical Exam   GEN: No acute distress.   Neck: No  JVD Cardiac: RRR, no murmurs, rubs, or gallops.  Respiratory:   Decreased breath sounds without crackles or wheezing GI: Soft, nontender, non-distended  MS: No mild right leg edema; No deformity. Neuro:  Nonfocal  Psych: Normal affect   Labs    Chemistry Recent Labs  Lab 08/23/18 1843 08/24/18 0403  08/24/18 1422 08/24/18 1559 08/24/18 2248 08/24/18 2257 08/25/18 0429  NA 136 136   < > 138 138 136  --  135  K 3.9 3.7   < > 3.8 3.7 4.1  --  4.0  CL  100 101   < > 103  --  103  --  103  CO2 26 25  --   --   --   --   --  24  GLUCOSE 211* 193*   < > 219* 183* 142*  --  114*  BUN 12 13   < > 9  --  9  --  7  CREATININE 1.04 0.88   < > 0.70  --  0.80 0.92 0.77  CALCIUM 8.6* 8.4*  --   --   --   --   --  8.1*  PROT 6.6  --   --   --   --   --   --   --   ALBUMIN 2.8*  --   --   --   --   --   --   --   AST 17  --   --   --   --   --   --   --   ALT 22  --   --   --   --   --   --   --   ALKPHOS 41  --   --   --   --   --   --   --   BILITOT 0.3  --   --   --   --   --   --   --   GFRNONAA >60 >60  --   --   --   --  >60 >60  GFRAA >60 >60  --   --   --   --  >60 >60  ANIONGAP 10 10  --   --   --   --   --  8   < > = values in this interval not displayed.     Hematology Recent Labs  Lab 08/24/18 1530  08/24/18 2248 08/24/18 2257 08/25/18 0429  WBC 22.2*  --   --  19.3* 19.2*  RBC 3.84*  --   --  3.87* 3.91*  HGB 10.6*   < > 10.5* 10.8* 10.6*    HCT 32.6*   < > 31.0* 32.6* 32.7*  MCV 84.9  --   --  84.2 83.6  MCH 27.6  --   --  27.9 27.1  MCHC 32.5  --   --  33.1 32.4  RDW 13.2  --   --  13.2 13.2  PLT 269  --   --  305 298   < > = values in this interval not displayed.    Cardiac Enzymes Recent Labs  Lab 08/18/18 1808  TROPONINI 0.04*   No results for input(s): TROPIPOC in the last 168 hours.   BNPNo results for input(s): BNP, PROBNP in the last 168 hours.   DDimer No results for input(s): DDIMER in the last 168 hours.   Radiology    Dg Chest Port 1 View  Result Date: 08/24/2018 CLINICAL DATA:  Status post coronary artery bypass graft. EXAM: PORTABLE CHEST 1 VIEW COMPARISON:  Radiographs of August 17, 2018. FINDINGS: Endotracheal and nasogastric tubes are in grossly good position. Right internal jugular Swan-Ganz catheter is noted with tip directed toward right pulmonary artery. Left-sided chest tube is noted without pneumothorax. Right apical fluid collection is noted. Mild left upper lobe subsegmental atelectasis is noted. Bony thorax is unremarkable. IMPRESSION: Endotracheal and nasogastric tubes in grossly good position. Left-sided chest tube is noted without pneumothorax. Mild left upper lobe subsegmental atelectasis.  Right apical fluid is noted which is new since prior exam; mediastinal hemorrhage can not be excluded. Electronically Signed   By: Lupita Raider, M.D.   On: 08/24/2018 16:11    Cardiac Studies   Cath:  12/26  Prox RCA lesion is 30% stenosed.  RPDA lesion is 30% stenosed.  Prox Cx to Mid Cx lesion is 30% stenosed.  Ost Ramus lesion is 30% stenosed.  Prox LAD lesion is 100% stenosed.  Ost 1st Diag lesion is 80% stenosed.  1st Diag lesion is 80% stenosed.    TTE 12/26  Study Conclusions  - Study data: Focused echocardiogram to assess LV thrombus. - Large apical thrombus is present. Largely unchanged appearance of   thrombus which measures 62 x 18 mm in approximate dimension.    Thrombus is mobile and extends toward the LVOT without   obstructing the mitral or aortic valves. - Left ventricle: The cavity size was moderately dilated. Wall   thickness was normal. Systolic function was moderately reduced.   The estimated ejection fraction was in the range of 35% to 40%. - Regional wall motion abnormality: Akinesis of the apical   myocardium. - Right atrium: The atrium was normal in size. - Pericardium, extracardiac: There was no pericardial effusion. - Critical findings discussed with Dr. Rennis Golden at 1345 on 08/23/18.    Patient Profile     47 y.o. male a hx of recurrent DVT/PE, DM2, GERD, arthritiswho is being seen for the evaluation of LV thrombusat the request of Dr. Elesa Massed.  He has had R iliac, femoral, popliteal, and tibial thromboembolectomy acutely this admission.  Also now status post CABG and LV thrombectomy.    Assessment & Plan    CAD/CABG:  Continue usual post op recovery.    RIGHT LOWER EXTREMITY THROMBECTOMY:  Discussed with the patient and Dr. Laneta Simmers.  Plan for warfarin for now. As an out patient we will discuss with him DOAC vs warfarin.  He is educated about the importance of lifelong anticoagulation.   LV THROMBECTOMY:  Was to be on lifelong anticoagulation for recurrent PE.  Unable to afford Xarelto.  See above.   CARDIOMYOPATHY:  TTE suggested EF of 35% pre CABG.  However, TEE yesterday listed EF as normal.  However, I reviewed the images from the TEE and the EF is moderately reduced.    For questions or updates, please contact [facility] Please consult [email or web address] for contact info under Cardiology/STEMI.   Signed, Rollene Rotunda, MD  08/25/2018, 8:06 AM

## 2018-08-25 NOTE — Progress Notes (Signed)
Patient ID: Marcus SpillerWilliam L Stella Jr., male   DOB: 1970-12-20, 47 y.o.   MRN: [id number] [facility] Evening Rounds:  Hemodynamically stable in sinus rhyhtm Milrinone 0.3, dop 3, minimal neo Urine output ok  BMET    Component Value Date/Time   NA 132 (L) 08/25/2018 1619   K 4.2 08/25/2018 1619   CL 97 (L) 08/25/2018 1619   CO2 24 08/25/2018 0429   GLUCOSE 228 (H) 08/25/2018 1619   BUN 11 08/25/2018 1619   CREATININE 1.20 08/25/2018 1619   CALCIUM 8.1 (L) 08/25/2018 0429   GFRNONAA >60 08/25/2018 1618   GFRAA >60 08/25/2018 1618   CBC    Component Value Date/Time   WBC 20.5 (H) 08/25/2018 1618   RBC 3.79 (L) 08/25/2018 1618   HGB 10.9 (L) 08/25/2018 1619   HCT 32.0 (L) 08/25/2018 1619   PLT 310 08/25/2018 1618   MCV 85.5 08/25/2018 1618   MCH 27.4 08/25/2018 1618   MCHC 32.1 08/25/2018 1618   RDW 13.4 08/25/2018 1618   LYMPHSABS 3.5 08/20/2018 0345   MONOABS 0.9 08/20/2018 0345   EOSABS 0.1 08/20/2018 0345   BASOSABS 0.0 08/20/2018 0345   A/P: stable. Continue inotropes for now and plan to wean in am.

## 2018-08-25 NOTE — Discharge Summary (Addendum)
Physician Discharge Summary  Patient ID: Marcus SpillerWilliam L Miler Jr. MRN: [id number] DOB/AGE: August 13, 1971 47 y.o.  Admit date: 08/17/2018 Discharge date: 09/02/2018  Admission Diagnoses:  Patient Active Problem List   Diagnosis Date Noted  . 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 Left ventricular thrombus without MI 08/18/2018  . CAP (community acquired pneumonia) 08/18/2018  . Chest pain 05/10/2018  . Atypical chest pain 05/10/2018  . Esophageal reflux   . Elevated BP   . Pleuritic chest pain 05/21/2016  . Upper airway cough syndrome 12/03/2014  . Faintness   . Syncope, tussive 11/01/2014  . Diabetes mellitus type 2, uncontrolled (HCC) 11/01/2014  . Hematuria   . PE (pulmonary embolism) 10/01/2014  . Hyperglycemia 10/01/2014  . Elevated troponin 10/01/2014  . Type 2 diabetes mellitus with hyperglycemia (HCC) 10/01/2014  . Morbid obesity (HCC) 10/01/2014  . Pulmonary embolism (HCC) 09/30/2014  . OA (osteoarthritis) of knee 06/02/2014   Discharge Diagnoses:   Patient Active Problem List   Diagnosis Date Noted  . S/P CABG x 2, Resection of LV Thrombus 08/25/2018  . 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 Left ventricular thrombus without MI 08/18/2018  . CAP (community acquired pneumonia) 08/18/2018  . Chest pain 05/10/2018  . Atypical chest pain 05/10/2018  . Esophageal reflux   . Elevated BP   . Pleuritic chest pain 05/21/2016  . Upper airway cough syndrome 12/03/2014  . Faintness   . Syncope, tussive 11/01/2014  . Diabetes mellitus type 2, uncontrolled (HCC) 11/01/2014  . Hematuria   . PE (pulmonary embolism) 10/01/2014  . Hyperglycemia 10/01/2014  . Elevated troponin 10/01/2014  . Type 2 diabetes mellitus with hyperglycemia (HCC) 10/01/2014  . Morbid obesity (HCC) 10/01/2014  . Pulmonary embolism (HCC) 09/30/2014  . OA (osteoarthritis) of knee 06/02/2014   Discharged Condition: good  History  of Present Illness:  [name] is a 47 yo morbidly obese AA male with known history of recurrent DVT/PE, Type 2 DM, GERD, and arthritis.  The patient was recently hospitalized in September of this year and was found to have a saddle PE.  He was placed on Xarelto, but recently stopped taking the medication due to cost.  He presented to the ED on 08/18/2018 with complaints of chest pain, dyspnea, and nausea.  Workup in the ED consisted of CTA that showed a non-occlusive RUL PE and a large LV Thrombus.  There was no evidence of right heart strain but there was evidence of PNA.  He was admitted to the hospitalist service started on Heparin and Cardiology consult was obtained.    Hospital Course:   They recommended the patient undergo CT surgery evaluation for possible resection.  He was also complaining of RLE leg pain.  LE duplex was ordered and showed no evidence of DVT, but there was no flow in the SFA popliteal or tibial arteries.  Vascular surgery consult was obtained and ultimately performed right iliac femoral, popliteal, and tibial thromboembolectomy.  He tolerated the procedure without difficulty.  He underwent repeat Echocardiogram which showed no improvement if LV thrombus despite Heparin administration.  It was felt surgical intervention would be required.  He underwent cardiac catheterization which showed LM disease.  He was evaluated by Dr. Tyrone SageGerhardt who felt coronary bypass grafting would be indicated as well as resection of LV thrombus.  The risks and benefits of the procedure were explained to the patient and he was agreeable to proceed.  He was taken to the operating  room on 08/24/2018.  He underwent CABG x 2 utilizing LIMA to LAD and SVG to Diagonal.  He underwent resection of LV thrombus and endoscopic harvest of greater saphenous vein from his left thigh.  He tolerated the procedure without difficulty and was taken to the SICU in stable condition.  He was extubated the evening of surgery.   The patient was weaned off Milrinone, Synephrine, and Dopamine.  His chest tubes and arterial lines were removed without difficulty.  He was started on coumadin for his H/O PE and LV thrombus.  He was in Sinus Tachycardia and his Lopressor dose was titrated.  He was ambulating independently and was medically stable for transfer to 4E on 08/29/2018.  He continued to make progress.  He remains in NSR and his pacing wires were removed without difficulty.  His last H and H was 8.7 and 26.7. His INR initially was slow to increase so Coumadin was increased accordingly. As of 01/05, his INR is up to 2.04.  As discussed with Dr. Donata Clay, patient will be discharged home on Coumadin 10 daily.  He still has some volume overload and will be given 5 more days of Lasix and potassium. His incisions are healing without evidence of infection. Of note, he has minor serous drainage from his lower sternal wound. This is from fat necrosis and there is no sign of infection. Patient was given betadine, gauze, and tape and told to change sternal dressing daily and PRN. If he develops increased drainage, redness, fever he is to call office asap.  He is ambulating independently.  As discussed with Dr. Donata Clay, he is medically stable for discharge home today.        Consults: vascular surgery  Significant Diagnostic Studies: angiography:    Prox RCA lesion is 30% stenosed.  RPDA lesion is 30% stenosed.  Prox Cx to Mid Cx lesion is 30% stenosed.  Ost Ramus lesion is 30% stenosed.  Prox LAD lesion is 100% stenosed.  Ost 1st Diag lesion is 80% stenosed.  1st Diag lesion is 80% stenosed.   Findings:  1. CAD with totally occluded mid LAD lesion and high grade lesion in diagonal ostium 2. Otherwise mild diffuse CAD 3. EF 35-40% by echo with large LV thrombus  Echocardiogram:  Study Conclusions  - Study data: Focused echocardiogram to assess LV thrombus. - Large apical thrombus is present. Largely unchanged  appearance of   thrombus which measures 62 x 18 mm in approximate dimension.   Thrombus is mobile and extends toward the LVOT without   obstructing the mitral or aortic valves. - Left ventricle: The cavity size was moderately dilated. Wall   thickness was normal. Systolic function was moderately reduced.   The estimated ejection fraction was in the range of 35% to 40%. - Regional wall motion abnormality: Akinesis of the apical   myocardium. - Right atrium: The atrium was normal in size. - Pericardium, extracardiac: There was no pericardial effusion. - Critical findings discussed with Dr. Rennis Golden at 1345 on 08/23/18.  Impressions:  - No significant change in thrombus appearance or size since   08/18/18.  Treatments: surgery:   CORONARY ARTERY BYPASS GRAFTING x 2 -LIMA to LAD -SVG to DIAGONAL  RESECTION OF LV THROMBUS  ENDOVEIN HARVEST OF GREATER SAPHENOUS VEIN  -Right Thigh  TRANSESOPHAGEAL ECHOCARDIOGRAM (TEE) (N/A)  Discharge Exam: Blood pressure 129/78, pulse 86, temperature 98.9 F (37.2 C), temperature source Oral, resp. rate (!) 21, height 5\' 11"  (1.803 m), weight (!) 141.9  kg, SpO2 100 %. Cardiovascular: RRR Pulmonary: Slightly diminished at bases Abdomen: Soft, non tender, bowel sounds present. Extremities: Mild bilateral lower extremity edema R>L Wounds: RLE wound is clean and dry.  No erythema or signs of infection. Trace serous drainage on lower sternal dressing but no sign of infection (likely fat necrosis).  Disposition: Discharge disposition: 01-Home or Self Care     Home  Discharge Medications:    Discharge Instructions    Amb Referral to Cardiac Rehabilitation   Complete by:  As directed    Diagnosis:  CABG   CABG X ___:  2     Allergies as of 09/02/2018      Reactions   Contrast Media [iodinated Diagnostic Agents] Itching   Pt states he thinks it makes him itch   Lovenox [enoxaparin Sodium] Itching   Itching over entire body   Percocet  [oxycodone-acetaminophen] Hives, Itching      Medication List    STOP taking these medications   ibuprofen 200 MG tablet Commonly known as:  ADVIL,MOTRIN   methocarbamol 500 MG tablet Commonly known as:  ROBAXIN   Rivaroxaban 15 & 20 MG Tbpk     TAKE these medications   aspirin EC 81 MG tablet Take 1 tablet (81 mg total) by mouth daily.   atorvastatin 80 MG tablet Commonly known as:  LIPITOR Take 1 tablet (80 mg total) by mouth daily at 6 PM.   furosemide 40 MG tablet Commonly known as:  LASIX Take 1 tablet (40 mg total) by mouth daily. For 5 days then stop.   insulin aspart 100 UNIT/ML FlexPen Commonly known as:  NOVOLOG Inject 6 Units into the skin 3 (three) times daily with meals.   Insulin Glargine 100 UNIT/ML Solostar Pen Commonly known as:  LANTUS Inject 35 Units into the skin daily. What changed:  how much to take   Insulin Pen Needle 32G X 4 MM Misc 1 Device by Does not apply route 4 (four) times daily. For use with insulin pens   losartan 25 MG tablet Commonly known as:  COZAAR Take 1 tablet (25 mg total) by mouth daily. Start taking on:  September 03, 2018   metoprolol tartrate 25 MG tablet Commonly known as:  LOPRESSOR Take 1 tablet (25 mg total) by mouth 2 (two) times daily.   pantoprazole 40 MG tablet Commonly known as:  PROTONIX Take 1 tablet (40 mg total) by mouth daily.   potassium chloride SA 20 MEQ tablet Commonly known as:  K-DUR,KLOR-CON Take 1 tablet (20 mEq total) by mouth daily. For 5 days then stop.   traMADol 50 MG tablet Commonly known as:  ULTRAM Take 1 tablet (50 mg total) by mouth every 4 (four) hours as needed for up to 7 days for moderate pain.   warfarin 10 MG tablet Commonly known as:  COUMADIN Take 1 tablet (10 mg total) by mouth daily. As directed     The patient has been discharged on:   1.Beta Blocker:  Yes [ X  ]                              No   [   ]                              If No, reason:  2.Ace  Inhibitor/ARB: Yes [  x ]  No  [   ]                                     If No, reason:  3.Statin:   Yes [ X ]                  No  [   ]                  If No, reason:  4.Ecasa:  Yes  [ X  ]                  No   [   ]                  If No, reason:  Follow-up Information    Delight OvensGerhardt, Edward B, MD Follow up on 09/27/2018.   Specialty:  Cardiothoracic Surgery Why:  Appointment is at 12:00, please get CX[facility] at 11:30 [facility] located on first floor of our office building Contact information: [address] [place] [address] [phone]        Cephus Shellinglark, Christopher J, MD. Go on 09/18/2018.   Specialty:  Vascular Surgery Why:  Appointment time is at 9:45 am Contact information: [address] [place] [address] [phone]        [facility] [address] [facility]. Go on 09/03/2018.   Specialty:  Cardiology Why:  Appointment time is at 8:50 am and is to have PT and INR checked (on Coumadin for recurrent PE,had LV thrombus removed; INR goal 2-2.5) Contact information: [address], [address] [place] [address] [phone]       Abelino DerrickKilroy, Luke K, [place]. Go on 09/11/2018.   Specialties:  Cardiology, Radiology Why:  Appointment time is at 10:00 am Contact information: [address] [place] [address] [phone]           Signed: Ardelle BallsDonielle M Zimmerman PA-C 09/02/2018, 10:10 AM

## 2018-08-26 ENCOUNTER — Inpatient Hospital Stay (HOSPITAL_COMMUNITY): Payer: Self-pay

## 2018-08-26 LAB — BASIC METABOLIC PANEL
Anion gap: 9 (ref 5–15)
BUN: 7 mg/dL (ref 6–20)
CO2: 23 mmol/L (ref 22–32)
Calcium: 7.9 mg/dL — ABNORMAL LOW (ref 8.9–10.3)
Chloride: 98 mmol/L (ref 98–111)
Creatinine, Ser: 0.95 mg/dL (ref 0.61–1.24)
GFR calc Af Amer: >60 mL/min (ref >60)
Glucose, Bld: 231 mg/dL — ABNORMAL HIGH (ref 70–99)
Potassium: 4.1 mmol/L (ref 3.5–5.1)
Sodium: 130 mmol/L — ABNORMAL LOW (ref 135–145)

## 2018-08-26 LAB — GLUCOSE, CAPILLARY
GLUCOSE-CAPILLARY: 188 mg/dL — AB (ref 70–99)
GLUCOSE-CAPILLARY: 198 mg/dL — AB (ref 70–99)
GLUCOSE-CAPILLARY: 122 mg/dL — AB (ref 70–99)
GLUCOSE-CAPILLARY: 148 mg/dL — AB (ref 70–99)
GLUCOSE-CAPILLARY: 164 mg/dL — AB (ref 70–99)

## 2018-08-26 LAB — CBC
HCT: 29.2 % — ABNORMAL LOW (ref 39.0–52.0)
Hemoglobin: 9.5 g/dL — ABNORMAL LOW (ref 13.0–17.0)
MCH: 27.9 pg (ref 26.0–34.0)
MCHC: 32.5 g/dL (ref 30.0–36.0)
MCV: 85.6 fL (ref 80.0–100.0)
Platelets: 261 10*3/uL (ref 150–400)
RBC: 3.41 MIL/uL — AB (ref 4.22–5.81)
RDW: 13.5 % (ref 11.5–15.5)
WBC: 17.9 10*3/uL — ABNORMAL HIGH (ref 4.0–10.5)
nRBC: 0 % (ref 0.0–0.2)

## 2018-08-26 MED ORDER — INSULIN ASPART 100 UNIT/ML ~~LOC~~ SOLN
6.0000 [IU] | Freq: Three times a day (TID) | SUBCUTANEOUS | Status: DC
  Administered 2018-08-26 – 2018-09-02 (×21): 6 [IU] via SUBCUTANEOUS

## 2018-08-26 MED ORDER — CHLORHEXIDINE GLUCONATE CLOTH 2 % EX PADS
6.0000 | MEDICATED_PAD | Freq: Every day | CUTANEOUS | Status: DC
  Administered 2018-08-26: 6 via TOPICAL

## 2018-08-26 MED ORDER — WARFARIN - PHYSICIAN DOSING INPATIENT
Freq: Every day | Status: DC
  Administered 2018-08-29: 17:00:00

## 2018-08-26 MED ORDER — SODIUM CHLORIDE 0.9% FLUSH
10.0000 mL | Freq: Two times a day (BID) | INTRAVENOUS | Status: DC
  Administered 2018-08-26 – 2018-08-28 (×2): 10 mL

## 2018-08-26 MED ORDER — INSULIN ASPART 100 UNIT/ML ~~LOC~~ SOLN
0.0000 [IU] | Freq: Three times a day (TID) | SUBCUTANEOUS | Status: DC
  Administered 2018-08-26: 2 [IU] via SUBCUTANEOUS
  Administered 2018-08-26: 8 [IU] via SUBCUTANEOUS
  Administered 2018-08-27: 4 [IU] via SUBCUTANEOUS
  Administered 2018-08-27 (×2): 2 [IU] via SUBCUTANEOUS
  Administered 2018-08-28: 8 [IU] via SUBCUTANEOUS
  Administered 2018-08-28: 2 [IU] via SUBCUTANEOUS
  Administered 2018-08-28: 8 [IU] via SUBCUTANEOUS
  Administered 2018-08-29: 4 [IU] via SUBCUTANEOUS
  Administered 2018-08-29: 2 [IU] via SUBCUTANEOUS
  Administered 2018-08-30: 4 [IU] via SUBCUTANEOUS
  Administered 2018-08-31: 2 [IU] via SUBCUTANEOUS
  Administered 2018-09-01 – 2018-09-02 (×3): 4 [IU] via SUBCUTANEOUS

## 2018-08-26 MED ORDER — INSULIN DETEMIR 100 UNIT/ML ~~LOC~~ SOLN
30.0000 [IU] | Freq: Every day | SUBCUTANEOUS | Status: DC
  Administered 2018-08-26 – 2018-08-28 (×3): 30 [IU] via SUBCUTANEOUS
  Filled 2018-08-26 (×5): qty 0.3

## 2018-08-26 MED ORDER — FUROSEMIDE 10 MG/ML IJ SOLN
40.0000 mg | Freq: Once | INTRAMUSCULAR | Status: AC
  Administered 2018-08-26: 40 mg via INTRAVENOUS
  Filled 2018-08-26: qty 4

## 2018-08-26 MED ORDER — SODIUM CHLORIDE 0.9% FLUSH: 10.0000 mL | INTRAVENOUS | Status: DC | PRN

## 2018-08-26 MED ORDER — WARFARIN SODIUM 5 MG PO TABS
5.0000 mg | ORAL_TABLET | Freq: Once | ORAL | Status: AC
  Administered 2018-08-26: 5 mg via ORAL
  Filled 2018-08-26: qty 1

## 2018-08-26 NOTE — Progress Notes (Signed)
Patient ID: Marcus SpillerWilliam L Fleeman Jr., male   DOB: Jun 17, 1971, 47 y.o.   MRN: [id number] Comfortable this morning. Right groin dry gauze dressing intact with no evidence of wound problem 2+ right dorsalis pedis pulse Stable from peripheral vascular standpoint

## 2018-08-26 NOTE — Progress Notes (Signed)
Patient ID: Marcus SpillerWilliam L Fauth Jr., male   DOB: 04-Apr-1971, 47 y.o.   MRN: [id number] [facility] Evening Rounds  Hemodynamically stable off milrinone and dopamine.  Diuresed a little today with lasix. BMET    Component Value Date/Time   NA 130 (L) 08/26/2018 0436   K 4.1 08/26/2018 0436   CL 98 08/26/2018 0436   CO2 23 08/26/2018 0436   GLUCOSE 231 (H) 08/26/2018 0436   BUN 7 08/26/2018 0436   CREATININE 0.95 08/26/2018 0436   CALCIUM 7.9 (L) 08/26/2018 0436   GFRNONAA >60 08/26/2018 0436   GFRAA >60 08/26/2018 0436   Ambulated.  Will start Coumadin tonight for LV thrombus, hx of DVT/PE.

## 2018-08-26 NOTE — Plan of Care (Signed)
  Problem: Activity: Goal: Risk for activity intolerance will decrease Outcome: Progressing   

## 2018-08-26 NOTE — Progress Notes (Signed)
2 Days Post-Op Procedure(s) (LRB): LEFT VENTRICULATOMY WITH REMOVAL OF LV CLOT,  CORONARY ARTERY BYPASS GRAFTING (CABG)  times TWO USING LEFT INTERNAL MAMMARY ARTERY TO LAD AND VEIN GRAFT TO 1ST DIAG. (N/A) TRANSESOPHAGEAL ECHOCARDIOGRAM (TEE) (N/A) ENDOVEIN HARVEST OF GREATER SAPHENOUS VEIN (Right) Subjective:  No complaints. Expected surgical pain. Ambulated two laps this am.  Objective: Vital signs in last 24 hours: Temp:  [98.4 F (36.9 C)-99.5 F (37.5 C)] 99.5 F (37.5 C) (12/29 0730) Pulse Rate:  [100-120] 113 (12/29 0900) Cardiac Rhythm: Sinus tachycardia (12/29 0806) Resp:  [0-32] 25 (12/29 0900) BP: (85-130)/(54-84) 107/69 (12/29 0900) SpO2:  [91 %-100 %] 91 % (12/29 0900) Arterial Line BP: (65-115)/(52-83) 98/54 (12/28 1130) Weight:  [144.6 kg] 144.6 kg (12/29 0500)  Hemodynamic parameters for last 24 hours:    Intake/Output from previous day: 12/28 0701 - 12/29 0700 In: 1975 [P.O.:900; I.V.:875; IV Piggyback:200] Out: 1217 [Urine:1167; Chest Tube:50] Intake/Output this shift: Total I/O In: 270.5 [P.O.:240; I.V.:30.5] Out: 15 [Urine:15]  General appearance: alert and cooperative Neurologic: intact Heart: regular rate and rhythm, S1, S2 normal, no murmur, click, rub or gallop Lungs: clear to auscultation bilaterally Extremities: extremities with mild edema Dressing dry  Lab Results: Recent Labs    08/25/18 1618 08/25/18 1619 08/26/18 0436  WBC 20.5*  --  17.9*  HGB 10.4* 10.9* 9.5*  HCT 32.4* 32.0* 29.2*  PLT 310  --  261   BMET:  Recent Labs    08/25/18 0429  08/25/18 1619 08/26/18 0436  NA 135  --  132* 130*  K 4.0  --  4.2 4.1  CL 103  --  97* 98  CO2 24  --   --  23  GLUCOSE 114*  --  228* 231*  BUN 7  --  11 7  CREATININE 0.77   < > 1.20 0.95  CALCIUM 8.1*  --   --  7.9*   < > = values in this interval not displayed.    PT/INR:  Recent Labs    08/24/18 1530  LABPROT 14.9  INR 1.18   ABG    Component Value Date/Time   PHART  7.414 08/24/2018 2104   HCO3 24.3 08/24/2018 2104   TCO2 26 08/25/2018 1619   ACIDBASEDEF 1.0 08/24/2018 1930   O2SAT 95.0 08/24/2018 2104   CBG (last 3)  Recent Labs    08/25/18 2303 08/26/18 0352 08/26/18 0736  GLUCAP 190* 188* 198*   CXR: ok  Assessment/Plan: S/P Procedure(s) (LRB): LEFT VENTRICULATOMY WITH REMOVAL OF LV CLOT,  CORONARY ARTERY BYPASS GRAFTING (CABG)  times TWO USING LEFT INTERNAL MAMMARY ARTERY TO LAD AND VEIN GRAFT TO 1ST DIAG. (N/A) TRANSESOPHAGEAL ECHOCARDIOGRAM (TEE) (N/A) ENDOVEIN HARVEST OF GREATER SAPHENOUS VEIN (Right)  Hemodynamically stable in sinus tachy rhythm. Wean milrinone and dopamine today. Preop EF 35%.  Volume excess: wt is 9 lbs over preop. Start diuresis.  DM: glucose still 190's. Will increase Levemir and change SSI [facility]. Increase meal coverage to 6 units. preop control was poor.  Continue IS, ambulation     LOS: 7 days    Alleen BorneBryan K Leanord Thibeau 08/26/2018

## 2018-08-27 ENCOUNTER — Telehealth: Payer: Self-pay | Admitting: Vascular Surgery

## 2018-08-27 ENCOUNTER — Encounter (HOSPITAL_COMMUNITY): Payer: Self-pay | Admitting: Cardiothoracic Surgery

## 2018-08-27 LAB — TYPE AND SCREEN
ABO/RH(D): B POS
Antibody Screen: NEGATIVE
Unit division: 0
Unit division: 0
Unit division: 0
Unit division: 0

## 2018-08-27 LAB — BPAM RBC
Blood Product Expiration Date: 202001272359
Blood Product Expiration Date: 202001272359
Blood Product Expiration Date: 202001272359
Blood Product Expiration Date: 202001272359
ISSUE DATE / TIME: 201912271055
ISSUE DATE / TIME: 201912271055
Unit Type and Rh: 7300
Unit Type and Rh: 7300
Unit Type and Rh: 7300
Unit Type and Rh: 7300

## 2018-08-27 LAB — PROTIME-INR
INR: 1.09
PROTHROMBIN TIME: 14 s (ref 11.4–15.2)

## 2018-08-27 LAB — GLUCOSE, CAPILLARY
Glucose-Capillary: 224 mg/dL — ABNORMAL HIGH (ref 70–99)
Glucose-Capillary: 152 mg/dL — ABNORMAL HIGH (ref 70–99)
Glucose-Capillary: 140 mg/dL — ABNORMAL HIGH (ref 70–99)
Glucose-Capillary: 160 mg/dL — ABNORMAL HIGH (ref 70–99)
Glucose-Capillary: 176 mg/dL — ABNORMAL HIGH (ref 70–99)
Glucose-Capillary: 129 mg/dL — ABNORMAL HIGH (ref 70–99)

## 2018-08-27 LAB — COOXEMETRY PANEL
Carboxyhemoglobin: 1.5 % (ref 0.5–1.5)
Methemoglobin: 1.5 % (ref 0.0–1.5)
O2 Saturation: 47.4 %
Total hemoglobin: 10.2 g/dL — ABNORMAL LOW (ref 12.0–16.0)

## 2018-08-27 MED ORDER — WARFARIN SODIUM 5 MG PO TABS
5.0000 mg | ORAL_TABLET | Freq: Every day | ORAL | Status: AC
  Administered 2018-08-27: 5 mg via ORAL
  Filled 2018-08-27: qty 1

## 2018-08-27 MED ORDER — METOPROLOL TARTRATE 12.5 MG HALF TABLET
12.5000 mg | ORAL_TABLET | Freq: Two times a day (BID) | ORAL | Status: DC
  Administered 2018-08-27 (×2): 12.5 mg via ORAL
  Filled 2018-08-27 (×2): qty 1

## 2018-08-27 MED ORDER — SODIUM CHLORIDE 0.9% FLUSH
3.0000 mL | Freq: Two times a day (BID) | INTRAVENOUS | Status: DC
  Administered 2018-08-27 – 2018-09-02 (×11): 3 mL via INTRAVENOUS

## 2018-08-27 MED ORDER — SODIUM CHLORIDE 0.9% FLUSH: 3.0000 mL | INTRAVENOUS | Status: DC | PRN

## 2018-08-27 MED ORDER — SODIUM CHLORIDE 0.9 % IV SOLN: 250.0000 mL | INTRAVENOUS | Status: DC | PRN

## 2018-08-27 MED ORDER — ENSURE MAX PROTEIN PO LIQD
11.0000 [oz_av] | Freq: Two times a day (BID) | ORAL | Status: DC
  Administered 2018-08-27 – 2018-09-02 (×11): 11 [oz_av] via ORAL
  Filled 2018-08-27 (×13): qty 330

## 2018-08-27 MED ORDER — ADULT MULTIVITAMIN W/MINERALS CH
1.0000 | ORAL_TABLET | Freq: Every day | ORAL | Status: DC
  Administered 2018-08-27: 1 via ORAL

## 2018-08-27 MED ORDER — ATORVASTATIN CALCIUM 80 MG PO TABS
80.0000 mg | ORAL_TABLET | Freq: Every day | ORAL | Status: DC
  Administered 2018-08-27 – 2018-09-01 (×6): 80 mg via ORAL
  Filled 2018-08-27 (×6): qty 1

## 2018-08-27 MED ORDER — POTASSIUM CHLORIDE CRYS ER 20 MEQ PO TBCR
20.0000 meq | EXTENDED_RELEASE_TABLET | Freq: Two times a day (BID) | ORAL | Status: DC
  Administered 2018-08-27 (×2): 20 meq via ORAL
  Filled 2018-08-27 (×3): qty 1

## 2018-08-27 MED ORDER — FUROSEMIDE 40 MG PO TABS
40.0000 mg | ORAL_TABLET | Freq: Two times a day (BID) | ORAL | Status: DC
  Administered 2018-08-27 – 2018-08-28 (×2): 40 mg via ORAL
  Filled 2018-08-27 (×2): qty 1

## 2018-08-27 MED ORDER — INSULIN ASPART 100 UNIT/ML ~~LOC~~ SOLN: 0.0000 [IU] | Freq: Three times a day (TID) | SUBCUTANEOUS | Status: DC

## 2018-08-27 MED ORDER — MOVING RIGHT ALONG BOOK
Freq: Once | Status: AC
  Administered 2018-08-27: 14:00:00
  Filled 2018-08-27: qty 1

## 2018-08-27 MED FILL — Heparin Sodium (Porcine) Inj 1000 Unit/ML: INTRAMUSCULAR | Qty: 30 | Status: AC

## 2018-08-27 MED FILL — Dexmedetomidine HCl in NaCl 0.9% IV Soln 400 MCG/100ML: INTRAVENOUS | Qty: 100 | Status: AC

## 2018-08-27 MED FILL — Potassium Chloride Inj 2 mEq/ML: INTRAVENOUS | Qty: 40 | Status: AC

## 2018-08-27 MED FILL — Magnesium Sulfate Inj 50%: INTRAMUSCULAR | Qty: 10 | Status: AC

## 2018-08-27 NOTE — Telephone Encounter (Signed)
sch appt lvm mld ltr 09/18/2018 945am p/o MD

## 2018-08-27 NOTE — Progress Notes (Signed)
Initial Nutrition Assessment  DOCUMENTATION CODES:   Morbid obesity  INTERVENTION:  - Will order Ensure Max BID, each supplement provides 150 kcal and 30 grams of protein. - Will order daily multivitamin with minerals. - Continue to encourage PO intakes.    NUTRITION DIAGNOSIS:   Inadequate oral intake related to acute illness, decreased appetite as evidenced by per patient/family report, meal completion < 25%.  GOAL:   Patient will meet greater than or equal to 90% of their needs  MONITOR:   PO intake, Supplement acceptance, Weight trends, Labs  REASON FOR ASSESSMENT:   Malnutrition Screening Tool  ASSESSMENT:   47 year old male with past medical history of insulin-dependent DM, R leg DVT, and recurrent PE. He presented to the ED with complaints of acute onset of substernal chest pain, SOB, and RLE numbness. Patient was found to have small non-occlusive RUL PE without R heart strain and large L liver lesion ventricular thrombus. Vascular surgery was consulted and he underwent emergent vascular intervention/thrombectomy on 12/21. He also underwent cardiac MRI and L heart catheterization. Underwent CABG x2 and resection of left ventricular thrombus by cardiac surgery 12/27.  Patient has been NPO or on CLD for nearly all of admission. Diet was advanced to Heart Healthy/Carb Modified on 12/28. Review of flow sheet shows patient ate 75% of lunch on 12/28, 25% of breakfast and lunch and 50% of dinner on 12/29. Patient reports eating ~25% of both breakfast and lunch today. He reports that appetite has not yet returned and that he has only had 1 BM in the past few days (12/27). He denies abdominal pain/pressure or nausea. He denies taste alterations. He denies chewing or swallowing issues.  Talked with patient about Ensure Max; he is willing to try it but is unsure if he will accept it after trying it.    Medications reviewed; 200 mg Colace/day, 40 mg oral Lasix BID, sliding scale  Novolog, 6 units Novolog TID, 30 units Levemir/day, 40 mg oral Protonix/day. Labs reviewed; CBGs: 152, 140, 160, and 176 mg/dL, Na: 454130 mmol/L, Ca: 7.9 mg/dL.     NUTRITION - FOCUSED PHYSICAL EXAM:  Completed; no muscle or fat wasting, mild edema to all extremities.   Diet Order:   Diet Order            Diet heart healthy/carb modified Room service appropriate? Yes; Fluid consistency: Thin  Diet effective now              EDUCATION NEEDS:   Not appropriate for education at this time  Skin:  Skin Assessment: Skin Integrity Issues: Skin Integrity Issues:: Incisions, Wound VAC Incisions: R groin, R leg, chest  Last BM:  12/27  Height:   Ht Readings from Last 1 Encounters:  08/21/18 5\' 11"  (1.803 m)    Weight:   Wt Readings from Last 1 Encounters:  08/27/18 (!) 144 kg    Ideal Body Weight:  78.18 kg  BMI:  Body mass index is 44.28 kg/m.  Estimated Nutritional Needs:   Kcal:  2160-2450 kcal  Protein:  120-130 grams  Fluid:  >/= 2.1 L/day     Trenton GammonJessica Francoise Chojnowski, MS, RD, LDN, [facility] Inpatient Clinical Dietitian Pager # [phone] After hours/weekend pager # [phone]

## 2018-08-27 NOTE — Progress Notes (Signed)
  Progress Note    08/27/2018 7:49 AM    Subjective:  Right foot feels good.  Remains in ICU after CABG.   Vitals:   08/27/18 0600 08/27/18 0700  BP: 118/83 125/81  Pulse: 100 99  Resp: (!) 22 (!) 24  Temp:  98.8 F (37.1 C)  SpO2: 100% 100%   Physical Exam: Incisions:  R groin incision c/d/i; Palpable R PT; soft R calf   CBC    Component Value Date/Time   WBC 17.9 (H) 08/26/2018 0436   RBC 3.41 (L) 08/26/2018 0436   HGB 9.5 (L) 08/26/2018 0436   HCT 29.2 (L) 08/26/2018 0436   PLT 261 08/26/2018 0436   MCV 85.6 08/26/2018 0436   MCH 27.9 08/26/2018 0436   MCHC 32.5 08/26/2018 0436   RDW 13.5 08/26/2018 0436   LYMPHSABS 3.5 08/20/2018 0345   MONOABS 0.9 08/20/2018 0345   EOSABS 0.1 08/20/2018 0345   BASOSABS 0.0 08/20/2018 0345    BMET    Component Value Date/Time   NA 130 (L) 08/26/2018 0436   K 4.1 08/26/2018 0436   CL 98 08/26/2018 0436   CO2 23 08/26/2018 0436   GLUCOSE 231 (H) 08/26/2018 0436   BUN 7 08/26/2018 0436   CREATININE 0.95 08/26/2018 0436   CALCIUM 7.9 (L) 08/26/2018 0436   GFRNONAA >60 08/26/2018 0436   GFRAA >60 08/26/2018 0436    INR    Component Value Date/Time   INR 1.09 08/27/2018 0312     Intake/Output Summary (Last 24 hours) at 08/27/2018 0749 Last data filed at 08/27/2018 0600 Gross per 24 hour  Intake 1175.79 ml  Output 1980 ml  Net -804.21 ml     Assessment/Plan:  47 y.o. male is s/p R iliac, femoral, popliteal, and tibial thromboembolectomy last weekend.  Right groin looks good.  Please keep dry gauze in right groin.  Palpable right pedal pulse.  Vascular will sign off now.  Call if questions or concerns.  Will arrange follow-up in clinic in 3 weeks for groin check.  On coumadin now.  Cephus Shellinghristopher J. Aislee Landgren, MD [facility] [place] Office: [phone] Pager: [phone]  Cephus Shellinghristopher J Camerin Jimenez

## 2018-08-27 NOTE — Plan of Care (Signed)
  Problem: Education: Goal: Knowledge of General Education information will improve Description Including pain rating scale, medication(s)/side effects and non-pharmacologic comfort measures Outcome: Progressing   Problem: Clinical Measurements: Goal: Ability to maintain clinical measurements within normal limits will improve Outcome: Progressing Goal: Will remain free from infection Outcome: Progressing Goal: Diagnostic test results will improve Outcome: Progressing Goal: Respiratory complications will improve Outcome: Progressing Goal: Cardiovascular complication will be avoided Outcome: Progressing   Problem: Pain Managment: Goal: General experience of comfort will improve Outcome: Progressing   Problem: Safety: Goal: Ability to remain free from injury will improve Outcome: Progressing   Problem: Activity: Goal: Ability to return to baseline activity level will improve Outcome: Progressing   Problem: Cardiovascular: Goal: Vascular access site(s) Level 0-1 will be maintained Outcome: Progressing

## 2018-08-27 NOTE — Telephone Encounter (Signed)
-----   Message from Dara LordsSamantha J Rhyne, [place]-C sent at 08/27/2018  7:54 AM EST ----- S/p Right iliac, femoral, popliteal, and tibial thromboembolectomy via right femoral cutdown.  F/u with Dr. Chestine Sporelark in 2-3 weeks.  Thanks

## 2018-08-27 NOTE — Progress Notes (Addendum)
TCTS DAILY ICU PROGRESS NOTE                   [address]            [place] [id number]          [phone]   3 Days Post-Op Procedure(s) (LRB): LEFT VENTRICULATOMY WITH REMOVAL OF LV CLOT,  CORONARY ARTERY BYPASS GRAFTING (CABG)  times TWO USING LEFT INTERNAL MAMMARY ARTERY TO LAD AND VEIN GRAFT TO 1ST DIAG. (N/A) TRANSESOPHAGEAL ECHOCARDIOGRAM (TEE) (N/A) ENDOVEIN HARVEST OF GREATER SAPHENOUS VEIN (Right)  Total Length of Stay:  LOS: 8 days   Subjective: Out of bed in the bedside chair having breakfast.  He has no complaints.  He ambulated 3 laps in the ICU yesterday.  He reports 1 bowel movement since surgery.  Pain control is adequate.  Objective: Vital signs in last 24 hours: Temp:  [97.4 F (36.3 C)-99.4 F (37.4 C)] 98.8 F (37.1 C) (12/30 0700) Pulse Rate:  [50-127] 99 (12/30 0700) Cardiac Rhythm: Sinus tachycardia (12/30 0700) Resp:  [12-48] 24 (12/30 0700) BP: (88-138)/(52-98) 125/81 (12/30 0700) SpO2:  [91 %-100 %] 100 % (12/30 0700) Weight:  [144 kg] 144 kg (12/30 0500)  Filed Weights   08/25/18 0500 08/26/18 0500 08/27/18 0500  Weight: (!) 142.5 kg (!) 144.6 kg (!) 144 kg    Weight change: -0.607 kg   Hemodynamic parameters for last 24 hours:    Intake/Output from previous day: 12/29 0701 - 12/30 0700 In: 1175.8 [P.O.:840; I.V.:335.8] Out: 1980 [Urine:1980]  Intake/Output this shift: No intake/output data recorded.  Current Meds: Scheduled Meds: . acetaminophen  1,000 mg Oral Q6H   Or  . acetaminophen (TYLENOL) oral liquid 160 mg/5 mL  1,000 mg Per Tube Q6H  . aspirin EC  325 mg Oral Daily   Or  . aspirin  324 mg Per Tube Daily  . bisacodyl  10 mg Oral Daily   Or  . bisacodyl  10 mg Rectal Daily  . Chlorhexidine Gluconate Cloth  6 each Topical Daily  . docusate sodium  200 mg Oral Daily  . insulin aspart  0-24 Units Subcutaneous TID WC  . insulin aspart  6 Units Subcutaneous TID WC  . insulin detemir  30 Units  Subcutaneous Daily  . pantoprazole  40 mg Oral Daily  . sodium chloride flush  10-40 mL Intracatheter Q12H  . sodium chloride flush  3 mL Intravenous Q12H  . Warfarin - Physician Dosing Inpatient   Does not apply q1800   Continuous Infusions: . sodium chloride Stopped (08/25/18 0837)  . sodium chloride Stopped (08/26/18 1619)  . sodium chloride Stopped (08/25/18 0845)  . DOPamine Stopped (08/26/18 1613)  . lactated ringers    . lactated ringers Stopped (08/27/18 0303)  . milrinone Stopped (08/26/18 1613)  . nitroGLYCERIN Stopped (08/24/18 1758)   PRN Meds:.sodium chloride, HYDROmorphone, morphine injection, ondansetron (ZOFRAN) IV, sodium chloride flush, sodium chloride flush, traMADol  Heart: Regular rate and rhythm.  Monitor review shows normal sinus rhythm with occasional mild sinus tachycardia. Chest: Breath sounds are clear to auscultation anterior and posterior.  Breath sounds are diminished in both bases.  A Prevena dressing is in place over the sternal incision and functioning appropriately.  Chest tubes have been removed Abdomen: Nontender Extremities: Right thigh EVH incision is intact and dry.  No palpable hematoma.  Right groin incision as per Dr. Ophelia Charterlark's description.  Lab Results: CBC: Recent Labs    08/25/18 1618 08/25/18  1619 08/26/18 0436  WBC 20.5*  --  17.9*  HGB 10.4* 10.9* 9.5*  HCT 32.4* 32.0* 29.2*  PLT 310  --  261   BMET:  Recent Labs    08/25/18 0429  08/25/18 1619 08/26/18 0436  NA 135  --  132* 130*  K 4.0  --  4.2 4.1  CL 103  --  97* 98  CO2 24  --   --  23  GLUCOSE 114*  --  228* 231*  BUN 7  --  11 7  CREATININE 0.77   < > 1.20 0.95  CALCIUM 8.1*  --   --  7.9*   < > = values in this interval not displayed.    CMET: Lab Results  Component Value Date   WBC 17.9 (H) 08/26/2018   HGB 9.5 (L) 08/26/2018   HCT 29.2 (L) 08/26/2018   PLT 261 08/26/2018   GLUCOSE 231 (H) 08/26/2018   ALT 22 08/23/2018   AST 17 08/23/2018   NA 130 (L)  08/26/2018   K 4.1 08/26/2018   CL 98 08/26/2018   CREATININE 0.95 08/26/2018   BUN 7 08/26/2018   CO2 23 08/26/2018   INR 1.09 08/27/2018   HGBA1C 9.6 (H) 08/23/2018      PT/INR:  Recent Labs    08/27/18 0312  LABPROT 14.0  INR 1.09   Radiology: No results found.   Assessment/Plan: S/P Procedure(s) (LRB): LEFT VENTRICULATOMY WITH REMOVAL OF LV CLOT,  CORONARY ARTERY BYPASS GRAFTING (CABG)  times TWO USING LEFT INTERNAL MAMMARY ARTERY TO LAD AND VEIN GRAFT TO 1ST DIAG. (N/A) TRANSESOPHAGEAL ECHOCARDIOGRAM (TEE) (N/A) ENDOVEIN HARVEST OF GREATER SAPHENOUS VEIN (Right)  Postop day 3 two-vessel CABG and left ventriculotomy for removal of apical thrombus.  Making satisfactory progress from CT surgery standpoint.  Will remove Foley catheter.  Transfer to cardiac telemetry.  Continue working on mobility.  Coumadin initiated on 08/26/2018.  INR is 1.09.  Continue Coumadin 5 mg for now and monitor INR daily.  Pulmonary: Still requiring nasal cannula oxygen.  Chest x-ray 12/29 shows bibasilar atelectasis.  Continue to encourage pulmonary hygiene and mobilization  Volume overload: Good response to a single dose of IV Lasix yesterday.  We will continue with oral Lasix p.o. twice daily.  Repeat chest x-ray in a.m.  Diabetes mellitus: Continue Levemir and sliding scale insulin  Expected acute blood loss anemia: Hematocrit stable, continue to monitor  Postoperative leukocytosis: No clinical evidence of infection, white blood count is trending down.  Continue to monitor.  DVT prophylaxis: Continue SCDs.  Patient has Lovenox allergy Leary RocaMyron G. Roddenberry, PA-C 08/27/2018 8:34 AM   Making good progress 3 days after CABG with removal of LV apical thrombus Sternal incision intact with Praveena to suction until postop day 5 Sinus rhythm, room air, ambulating Ready to transfer to 4 E. Today 2 view chest x-ray in a.m. Right groin incision clean and dry, vascular surgery signed off Continue  Coumadin load for INR 1.1.  Patient allergic to Lovenox. patient examined and medical record reviewed,agree with above note. Kathlee Nationseter Van Trigt III 08/27/2018

## 2018-08-27 NOTE — Progress Notes (Signed)
CT surgery p.m. Rounds  Patient had a stable day ambulated hallway Maintaining sinus rhythm Waiting for transfer to 4 E. Continue current care

## 2018-08-28 ENCOUNTER — Inpatient Hospital Stay (HOSPITAL_COMMUNITY): Payer: Self-pay

## 2018-08-28 LAB — CBC
HCT: 28.3 % — ABNORMAL LOW (ref 39.0–52.0)
Hemoglobin: 9.3 g/dL — ABNORMAL LOW (ref 13.0–17.0)
MCH: 28.1 pg (ref 26.0–34.0)
MCHC: 32.9 g/dL (ref 30.0–36.0)
MCV: 85.5 fL (ref 80.0–100.0)
Platelets: 384 10*3/uL (ref 150–400)
RBC: 3.31 MIL/uL — AB (ref 4.22–5.81)
RDW: 13.9 % (ref 11.5–15.5)
WBC: 16.8 10*3/uL — ABNORMAL HIGH (ref 4.0–10.5)
nRBC: 0 % (ref 0.0–0.2)

## 2018-08-28 LAB — COMPREHENSIVE METABOLIC PANEL
ALT: 21 U/L (ref 0–44)
AST: 41 U/L (ref 15–41)
Albumin: 2.6 g/dL — ABNORMAL LOW (ref 3.5–5.0)
Alkaline Phosphatase: 63 U/L (ref 38–126)
Anion gap: 11 (ref 5–15)
BUN: 12 mg/dL (ref 6–20)
CO2: 25 mmol/L (ref 22–32)
Calcium: 8.4 mg/dL — ABNORMAL LOW (ref 8.9–10.3)
Chloride: 98 mmol/L (ref 98–111)
Creatinine, Ser: 1.12 mg/dL (ref 0.61–1.24)
GFR calc Af Amer: >60 mL/min (ref >60)
GFR calc non Af Amer: >60 mL/min (ref >60)
Glucose, Bld: 185 mg/dL — ABNORMAL HIGH (ref 70–99)
Potassium: 4.9 mmol/L (ref 3.5–5.1)
Sodium: 134 mmol/L — ABNORMAL LOW (ref 135–145)
Total Bilirubin: 0.9 mg/dL (ref 0.3–1.2)
Total Protein: 6.7 g/dL (ref 6.5–8.1)

## 2018-08-28 LAB — PROTIME-INR
INR: 1.08
Prothrombin Time: 13.9 seconds (ref 11.4–15.2)

## 2018-08-28 LAB — GLUCOSE, CAPILLARY
GLUCOSE-CAPILLARY: 138 mg/dL — AB (ref 70–99)
GLUCOSE-CAPILLARY: 223 mg/dL — AB (ref 70–99)
Glucose-Capillary: 23 mg/dL — CL (ref 70–99)
Glucose-Capillary: 234 mg/dL — ABNORMAL HIGH (ref 70–99)

## 2018-08-28 MED ORDER — METOPROLOL TARTRATE 25 MG PO TABS
25.0000 mg | ORAL_TABLET | Freq: Two times a day (BID) | ORAL | Status: DC
  Administered 2018-08-28 – 2018-09-02 (×11): 25 mg via ORAL
  Filled 2018-08-28 (×11): qty 1

## 2018-08-28 MED ORDER — WARFARIN SODIUM 7.5 MG PO TABS
7.5000 mg | ORAL_TABLET | Freq: Every day | ORAL | Status: AC
  Administered 2018-08-28: 7.5 mg via ORAL
  Filled 2018-08-28: qty 1

## 2018-08-28 MED ORDER — FUROSEMIDE 40 MG PO TABS
40.0000 mg | ORAL_TABLET | Freq: Every day | ORAL | Status: AC
  Administered 2018-08-29 – 2018-08-31 (×3): 40 mg via ORAL
  Filled 2018-08-28 (×3): qty 1

## 2018-08-28 MED FILL — Sodium Chloride IV Soln 0.9%: INTRAVENOUS | Qty: 2000 | Status: AC

## 2018-08-28 MED FILL — Sodium Bicarbonate IV Soln 8.4%: INTRAVENOUS | Qty: 50 | Status: AC

## 2018-08-28 MED FILL — Heparin Sodium (Porcine) Inj 1000 Unit/ML: INTRAMUSCULAR | Qty: 60 | Status: AC

## 2018-08-28 MED FILL — Lidocaine HCl(Cardiac) IV PF Soln Pref Syr 100 MG/5ML (2%): INTRAVENOUS | Qty: 5 | Status: AC

## 2018-08-28 MED FILL — Electrolyte-R (PH 7.4) Solution: INTRAVENOUS | Qty: 3000 | Status: AC

## 2018-08-28 MED FILL — Mannitol IV Soln 20%: INTRAVENOUS | Qty: 500 | Status: AC

## 2018-08-28 MED FILL — Antithrombin III (Human) For Inj 500 Unit: INTRAVENOUS | Qty: 10 | Status: AC

## 2018-08-28 NOTE — Progress Notes (Addendum)
T[facility] DAILY ICU PROGRESS NOTE                   [address]            [place] [id number]          [phone]   4 Days Post-Op Procedure(s) (LRB): LEFT VENTRICULATOMY WITH REMOVAL OF LV CLOT,  CORONARY ARTERY BYPASS GRAFTING (CABG)  times TWO USING LEFT INTERNAL MAMMARY ARTERY TO LAD AND VEIN GRAFT TO 1ST DIAG. (N/A) TRANSESOPHAGEAL ECHOCARDIOGRAM (TEE) (N/A) ENDOVEIN HARVEST OF GREATER SAPHENOUS VEIN (Right)  Total Length of Stay:  LOS: 9 days   Subjective:      [address]       [place] [id number]             [phone]     Objective: Vital signs in last 24 hours: Temp:  [98.7 F (37.1 C)-100.1 F (37.8 C)] 100.1 F (37.8 C) (12/31 0829) Pulse Rate:  [55-108] 55 (12/31 0757) Cardiac Rhythm: Sinus tachycardia (12/31 0400) Resp:  [13-35] 26 (12/31 0757) BP: (91-128)/(61-84) 101/64 (12/31 0757) SpO2:  [95 %-99 %] 96 % (12/31 0757) Weight:  [142.5 kg] 142.5 kg (12/31 0400)  Filed Weights   08/26/18 0500 08/27/18 0500 08/28/18 0400  Weight: (!) 144.6 kg (!) 144 kg (!) 142.5 kg    Weight change: -1.5 kg   Hemodynamic parameters for last 24 hours:    Intake/Output from previous day: 12/30 0701 - 12/31 0700 In: 323 [P.O.:323] Out: 1000 [Urine:1000]  Intake/Output this shift: No intake/output data recorded.  Current Meds: Scheduled Meds: . acetaminophen  1,000 mg Oral Q6H  . aspirin EC  325 mg Oral Daily   Or  . aspirin  324 mg Per Tube Daily  . atorvastatin  80 mg Oral q1800  . bisacodyl  10 mg Oral Daily   Or  . bisacodyl  10 mg Rectal Daily  . docusate sodium  200 mg Oral Daily  . furosemide  40 mg Oral BID  . insulin aspart  0-24 Units Subcutaneous TID WC  . insulin aspart  6 Units Subcutaneous TID WC  . insulin detemir  30 Units Subcutaneous Daily  . metoprolol tartrate  25 mg Oral BID  . pantoprazole  40 mg Oral Daily  . potassium chloride  20 mEq Oral BID  . ENSURE MAX PROTEIN  11 oz Oral BID  . sodium  chloride flush  10-40 mL Intracatheter Q12H  . sodium chloride flush  3 mL Intravenous Q12H  . warfarin  7.5 mg Oral q1800  . Warfarin - Physician Dosing Inpatient   Does not apply q1800   Continuous Infusions: . sodium chloride     PRN Meds:.sodium chloride, HYDROmorphone, ondansetron (ZOFRAN) IV, sodium chloride flush, sodium chloride flush, traMADol  General appearance: alert, cooperative and no distress Heart: regular rate and rhythm Lungs: clear to auscultation bilaterally Abdomen: soft, non-tender; bowel sounds normal; no masses,  no organomegaly Extremities: edema minimal Wound: pravena in place on sternum, EVH site clean and dry  Lab Results: CBC: Recent Labs    08/26/18 0436 08/28/18 0324  WBC 17.9* 16.8*  HGB 9.5* 9.3*  HCT 29.2* 28.3*  PLT 261 384   BMET:  Recent Labs    08/26/18 0436 08/28/18 0627  NA 130* 134*  K 4.1 4.9  CL 98 98  CO2 23 25  GLUCOSE 231* 185*  BUN 7 12  CREATININE 0.95 1.12  CALCIUM 7.9* 8.4*  CMET: Lab Results  Component Value Date   WBC 16.8 (H) 08/28/2018   HGB 9.3 (L) 08/28/2018   HCT 28.3 (L) 08/28/2018   PLT 384 08/28/2018   GLUCOSE 185 (H) 08/28/2018   ALT 21 08/28/2018   AST 41 08/28/2018   NA 134 (L) 08/28/2018   K 4.9 08/28/2018   CL 98 08/28/2018   CREATININE 1.12 08/28/2018   BUN 12 08/28/2018   CO2 25 08/28/2018   INR 1.08 08/28/2018   HGBA1C 9.6 (H) 08/23/2018      PT/INR:  Recent Labs    08/28/18 0324  LABPROT 13.9  INR 1.08   Radiology: Dg Chest 2 View  Result Date: 08/28/2018 CLINICAL DATA:  CABG 4 days ago. EXAM: CHEST - 2 VIEW COMPARISON:  One-view chest x-ray 08/26/2018 FINDINGS: The heart is enlarged. Lung volumes are low. The right IJ sheath was removed. There is no edema or effusion. Mild bibasilar atelectasis remains. IMPRESSION: 1. Cardiomegaly without failure. 2. Interval removal of right IJ sheath. Electronically Signed   By: Marin Robertshristopher  Mattern M.D.   On: 08/28/2018 07:42      Assessment/Plan: S/P Procedure(s) (LRB): LEFT VENTRICULATOMY WITH REMOVAL OF LV CLOT,  CORONARY ARTERY BYPASS GRAFTING (CABG)  times TWO USING LEFT INTERNAL MAMMARY ARTERY TO LAD AND VEIN GRAFT TO 1ST DIAG. (N/A) TRANSESOPHAGEAL ECHOCARDIOGRAM (TEE) (N/A) ENDOVEIN HARVEST OF GREATER SAPHENOUS VEIN (Right)  1. CV- Sinus Tachycardia, BP labile at times- will increase Lopressor to 25 mg BID if BP can tolerate, parameters in place 2. INR 1.08. minimal response to 5 mg daily, increase dose to 7.5 mg daily, no Lovenox as patient is allergic 3. Pulm- continued atelectasis, off oxygen, continue IS 4. Renal- creatinine at 1.12, K WNL, minimal edema on exam, I/O have been negative, will d/c Lasix for now, repeat BMET in AM 5. DM-sugars controlled, continue current insulin regimen 6. Dispo- patient stable, will increase lopressor for better HR control as BP tolerates, d/c epw today, stop lasix, will repeat BMET in AM, transfer to 4E  Erin Barrett 08/28/2018 8:34 AM   We will transfer to 4 E. telemetry bed Remove Praveena wound VAC system today-no drainage postop Remove EP W's today  Continue with Coumadin loading for LV thrombus removed at surgery  patient examined and medical record reviewed,agree with above note. Kathlee Nationseter Van Trigt III 08/28/2018

## 2018-08-28 NOTE — Discharge Instructions (Addendum)
Please use Betadine, dry gauze, and tape to lower sternal wound (for drainage). Change dressing daily and PRN  Discharge Instructions:  1. You may shower, please wash incisions daily with soap and water and keep dry.  If you wish to cover wounds with dressing you may do so but please keep clean and change daily.  No tub baths or swimming until incisions have completely healed.  If your incisions become red or develop any drainage please call our office at [phone]  2. No Driving until cleared by Dr. Dennie Maizes office and you are no longer using narcotic pain medications  3. Monitor your weight daily.. Please use the same scale and weigh at same time... If you gain 5-10 lbs in 48 hours with associated lower extremity swelling, please contact our office at [phone]  4. Fever of 101.5 for at least 24 hours with no source, please contact our office at [phone]  5. Activity- up as tolerated, please walk at least 3 times per day.  Avoid strenuous activity, no lifting, pushing, or pulling with your arms over 8-10 lbs for a minimum of 6 weeks  6. If any questions or concerns arise, please do not hesitate to contact our office at [phone]    [facility]  Discharge instructions  Lower Extremity Bypass Surgery  Please refer to the following instruction for your post-procedure care. Your surgeon or physician assistant will discuss any changes with you.  Activity  You are encouraged to walk as much as you can. You can slowly return to normal activities during the month after your surgery. Avoid strenuous activity and heavy lifting until your doctor tells you it's OK. Avoid activities such as vacuuming or swinging a golf club. Do not drive until your doctor give the OK and you are no longer taking prescription pain medications. It is also normal to have difficulty with sleep habits, eating and bowel movement after surgery. These will go away with  time.  Bathing/Showering  Shower daily after you go home. Do not soak in a bathtub, hot tub, or swim until the incision heals completely.  Incision Care  Clean your incision with mild soap and water. Shower every day. Pat the area dry with a clean towel. You do not need a bandage unless otherwise instructed. Do not apply any ointments or creams to your incision. If you have open wounds you will be instructed how to care for them or a visiting nurse may be arranged for you. If you have staples or sutures along your incision they will be removed at your post-op appointment. You may have skin glue on your incision. Do not peel it off. It will come off on its own in about one week.  Wash the groin wound with soap and water daily and pat dry. (No tub bath-only shower)  Then put a dry gauze or washcloth in the groin to keep this area dry to help prevent wound infection.  Do this daily and as needed.  Do not use Vaseline or neosporin on your incisions.  Only use soap and water on your incisions and then protect and keep dry.  Diet  Resume your normal diet. There are no special food restrictions following this procedure. A low fat/ low cholesterol diet is recommended for all patients with vascular disease. In order to heal from your surgery, it is CRITICAL to get adequate nutrition. Your body requires vitamins, minerals, and protein. Vegetables are the best source of vitamins and minerals. Vegetables  also provide the perfect balance of protein. Processed food has little nutritional value, so try to avoid this.  Medications  Resume taking all your medications unless your doctor or physician assistant tells you not to. If your incision is causing pain, you may take over-the-counter pain relievers such as acetaminophen (Tylenol). If you were prescribed a stronger pain medication, please aware these medication can cause nausea and constipation. Prevent nausea by taking the medication with a snack or meal.  Avoid constipation by drinking plenty of fluids and eating foods with high amount of fiber, such as fruits, vegetables, and grains. Take Colace 100 mg (an over-the-counter stool softener) twice a day as needed for constipation.  Do not take Tylenol if you are taking prescription pain medications.  Follow Up  Our office will schedule a follow up appointment 2-3 weeks following discharge.  Please call us immediately for any of the following conditions  Severe or worsening pain in your legs or feet while at rest or while walking Increase pain, redness, warmth, or drainage (pus) from your incision site(s) Fever of 101 degree or higher The swelling in your leg with the bypass suddenly worsens and becomes more painful than when you were in the hospital If you have been instructed to feel your graft pulse then you should do so every day. If you can no longer feel this pulse, call the office immediately. Not all patients are given this instruction.  Leg swelling is common after leg bypass surgery.  The swelling should improve over a few months following surgery. To improve the swelling, you may elevate your legs above the level of your heart while you are sitting or resting. Your surgeon or physician assistant may ask you to apply an ACE wrap or wear compression (TED) stockings to help to reduce swelling.  Reduce your risk of vascular disease  Stop smoking. If you would like help call [facility] at 1-800-QUIT-NOW ((231)082-2043) or [place] at [phone].  Manage your cholesterol Maintain a desired weight Control your diabetes weight Control your diabetes Keep your blood pressure down  If you have any questions, please call the office at [phone]  Vitamin K Foods and Warfarin Warfarin is a blood thinner (anticoagulant). Anticoagulant medicines help prevent the formation of blood clots. These medicines work by decreasing the activity of vitamin K, which promotes normal blood  clotting. When you take warfarin, problems can occur from suddenly increasing or decreasing the amount of vitamin K that you eat from one day to the next. Problems may include:  Blood clots.  Bleeding. What general guidelines do I need to follow? To avoid problems when taking warfarin:  Eat a balanced diet that includes: ? Fresh fruits and vegetables. ? Whole grains. ? Low-fat dairy products. ? Lean proteins, such as fish, eggs, and lean cuts of meat.  Keep your intake of vitamin K consistent from day to day. To do this: ? Avoid eating large amounts of vitamin K one day and low amounts of vitamin K the next day. ? If you take a multivitamin that contains vitamin K, be sure to take it every day. ? Know which foods contain vitamin K. Use the lists below to understand serving sizes and the amount of vitamin K in one serving.  Avoid major changes in your diet. If you are going to change your diet, talk with your health care provider before making changes.  Work with a Dealer (dietitian) to develop a meal plan that works best for you.  High vitamin K foods Foods that are high in vitamin K contain more than 100 mcg (micrograms) per serving. These include:  Broccoli (cooked) -  cup has 110 mcg.  Brussels sprouts (cooked) -  cup has 109 mcg.  Greens, beet (cooked) -  cup has 350 mcg.  Greens, collard (cooked) -  cup has 418 mcg.  Greens, turnip (cooked) -  cup has 265 mcg.  Green onions or scallions -  cup has 105 mcg.  Kale (fresh or frozen) -  cup has 531 mcg.  Parsley (raw) - 10 sprigs has 164 mcg.  Spinach (cooked) -  cup has 444 mcg.  Swiss chard (cooked) -  cup has 287 mcg. Moderate vitamin K foods Foods that have a moderate amount of vitamin K contain 25-100 mcg per serving. These include:  Asparagus (cooked) - 5 spears have 38 mcg.  Black-eyed peas (dried) -  cup has 32 mcg.  Cabbage (cooked) -  cup has 37 mcg.  Kiwi fruit - 1 medium has  31 mcg.  Lettuce - 1 cup has 57-63 mcg.  Okra (frozen) -  cup has 44 mcg.  Prunes (dried) - 5 prunes have 25 mcg.  Watercress (raw) - 1 cup has 85 mcg. Low vitamin K foods Foods low in vitamin K contain less than 25 mcg per serving. These include:  Artichoke - 1 medium has 18 mcg.  Avocado - 1 oz. has 6 mcg.  Blueberries -  cup has 14 mcg.  Cabbage (raw) -  cup has 21 mcg.  Carrots (cooked) -  cup has 11 mcg.  Cauliflower (raw) -  cup has 11 mcg.  Cucumber with peel (raw) -  cup has 9 mcg.  Grapes -  cup has 12 mcg.  Mango - 1 medium has 9 mcg.  Nuts - 1 oz. has 15 mcg.  Pear - 1 medium has 8 mcg.  Peas (cooked) -  cup has 19 mcg.  Pickles - 1 spear has 14 mcg.  Pumpkin seeds - 1 oz. has 13 mcg.  Sauerkraut (canned) -  cup has 16 mcg.  Soybeans (cooked) -  cup has 16 mcg.  Tomato (raw) - 1 medium has 10 mcg.  Tomato sauce -  cup has 17 mcg. Vitamin K-free foods If a food contain less than 5 mcg per serving, it is considered to have no vitamin K. These foods include:  Bread and cereal products.  Cheese.  Eggs.  Fish and shellfish.  Meat and poultry.  Milk and dairy products.  Sunflower seeds. Actual amounts of vitamin K in foods may be different depending on processing. Talk with your dietitian about what foods you can eat and what foods you should avoid. This information is not intended to replace advice given to you by your health care provider. Make sure you discuss any questions you have with your health care provider. Document Released: 06/12/2009 Document Revised: 03/06/2016 Document Reviewed: 11/18/2015 Elsevier Interactive Patient Education  2019 [employer]  --------------------------- Information on my medicine - Coumadin   (Warfarin)  This medication education was reviewed with me or my healthcare representative as part of my discharge preparation.  The pharmacist that spoke with me during my hospital stay was:  Benny LennertMeyer,  Andrew David, [facility]  Why was Coumadin prescribed for you? Coumadin was prescribed for you because you have a blood clot or a medical condition that can cause an increased risk of forming blood clots. Blood clots can cause serious health problems by blocking the  flow of blood to the heart, lung, or brain. Coumadin can prevent harmful blood clots from forming. As a reminder your indication for Coumadin is:   Clot in the heart and history of clot in the leg and lung  What test will check on my response to Coumadin? While on Coumadin (warfarin) you will need to have an INR test regularly to ensure that your dose is keeping you in the desired range. The INR (international normalized ratio) number is calculated from the result of the laboratory test called prothrombin time (PT).  If an INR APPOINTMENT HAS NOT ALREADY BEEN MADE FOR YOU please schedule an appointment to have this lab work done by your health care provider within 7 days. Your INR goal is usually a number between:  2 to 3 or your provider may give you a more narrow range like 2-2.5.  Ask your health care provider during an office visit what your goal INR is.  What  do you need to  know  About  COUMADIN? Take Coumadin (warfarin) exactly as prescribed by your healthcare provider about the same time each day.  DO NOT stop taking without talking to the doctor who prescribed the medication.  Stopping without other blood clot prevention medication to take the place of Coumadin may increase your risk of developing a new clot or stroke.  Get refills before you run out.  What do you do if you miss a dose? If you miss a dose, take it as soon as you remember on the same day then continue your regularly scheduled regimen the next day.  Do not take two doses of Coumadin at the same time.  Important Safety Information A possible side effect of Coumadin (Warfarin) is an increased risk of bleeding. You should call your healthcare provider right away if you  experience any of the following: ? Bleeding from an injury or your nose that does not stop. ? Unusual colored urine (red or dark brown) or unusual colored stools (red or black). ? Unusual bruising for unknown reasons. ? A serious fall or if you hit your head (even if there is no bleeding).  Some foods or medicines interact with Coumadin (warfarin) and might alter your response to warfarin. To help avoid this: ? Eat a balanced diet, maintaining a consistent amount of Vitamin K. ? Notify your provider about major diet changes you plan to make. ? Avoid alcohol or limit your intake to 1 drink for women and 2 drinks for men per day. (1 drink is 5 oz. wine, 12 oz. beer, or 1.5 oz. liquor.)  Make sure that ANY health care provider who prescribes medication for you knows that you are taking Coumadin (warfarin).  Also make sure the healthcare provider who is monitoring your Coumadin knows when you have started a new medication including herbals and non-prescription products.  Coumadin (Warfarin)  Major Drug Interactions  Increased Warfarin Effect Decreased Warfarin Effect  Alcohol (large quantities) Antibiotics (esp. Septra/Bactrim, Flagyl, Cipro) Amiodarone (Cordarone) Aspirin (ASA) Cimetidine (Tagamet) Megestrol (Megace) NSAIDs (ibuprofen, naproxen, etc.) Piroxicam (Feldene) Propafenone (Rythmol SR) Propranolol (Inderal) Isoniazid (INH) Posaconazole (Noxafil) Barbiturates (Phenobarbital) Carbamazepine (Tegretol) Chlordiazepoxide (Librium) Cholestyramine (Questran) Griseofulvin Oral Contraceptives Rifampin Sucralfate (Carafate) Vitamin K   Coumadin (Warfarin) Major Herbal Interactions  Increased Warfarin Effect Decreased Warfarin Effect  Garlic Ginseng Ginkgo biloba Coenzyme Q10 Green tea St. Johns wort    Coumadin (Warfarin) FOOD Interactions  Eat a consistent number of servings per week of foods HIGH in Vitamin K (1 serving =  cup)  Collards (cooked, or boiled &  drained) Kale (cooked, or boiled & drained) Mustard greens (cooked, or boiled & drained) Parsley *serving size only =  cup Spinach (cooked, or boiled & drained) Swiss chard (cooked, or boiled & drained) Turnip greens (cooked, or boiled & drained)  Eat a consistent number of servings per week of foods MEDIUM-HIGH in Vitamin K (1 serving = 1 cup)  Asparagus (cooked, or boiled & drained) Broccoli (cooked, boiled & drained, or raw & chopped) Brussel sprouts (cooked, or boiled & drained) *serving size only =  cup Lettuce, raw (green leaf, endive, romaine) Spinach, raw Turnip greens, raw & chopped   These websites have more information on Coumadin (warfarin):  http://www.king-russell.com/www.coumadin.com; https://www.hines.net/www.ahrq.gov/consumer/coumadin.htm;

## 2018-08-29 DIAGNOSIS — Z951 Presence of aortocoronary bypass graft: Secondary | ICD-10-CM

## 2018-08-29 LAB — PROTIME-INR
INR: 1.12
Prothrombin Time: 14.3 seconds (ref 11.4–15.2)

## 2018-08-29 LAB — BASIC METABOLIC PANEL
Anion gap: 12 (ref 5–15)
BUN: 18 mg/dL (ref 6–20)
CO2: 24 mmol/L (ref 22–32)
Calcium: 8.2 mg/dL — ABNORMAL LOW (ref 8.9–10.3)
Chloride: 95 mmol/L — ABNORMAL LOW (ref 98–111)
Creatinine, Ser: 1.18 mg/dL (ref 0.61–1.24)
GFR calc Af Amer: >60 mL/min (ref >60)
GFR calc non Af Amer: >60 mL/min (ref >60)
Glucose, Bld: 174 mg/dL — ABNORMAL HIGH (ref 70–99)
Potassium: 4.4 mmol/L (ref 3.5–5.1)
SODIUM: 131 mmol/L — AB (ref 135–145)

## 2018-08-29 LAB — GLUCOSE, CAPILLARY
Glucose-Capillary: 131 mg/dL — ABNORMAL HIGH (ref 70–99)
Glucose-Capillary: 167 mg/dL — ABNORMAL HIGH (ref 70–99)
Glucose-Capillary: 144 mg/dL — ABNORMAL HIGH (ref 70–99)
Glucose-Capillary: 90 mg/dL (ref 70–99)
Glucose-Capillary: 121 mg/dL — ABNORMAL HIGH (ref 70–99)

## 2018-08-29 LAB — AEROBIC/ANAEROBIC CULTURE W GRAM STAIN (SURGICAL/DEEP WOUND)

## 2018-08-29 LAB — MAGNESIUM: MAGNESIUM: 2 mg/dL (ref 1.7–2.4)

## 2018-08-29 LAB — AEROBIC/ANAEROBIC CULTURE (SURGICAL/DEEP WOUND): CULTURE: NO GROWTH

## 2018-08-29 MED ORDER — LOSARTAN POTASSIUM 25 MG PO TABS
25.0000 mg | ORAL_TABLET | Freq: Every day | ORAL | Status: DC
  Administered 2018-08-29 – 2018-09-02 (×5): 25 mg via ORAL
  Filled 2018-08-29 (×5): qty 1

## 2018-08-29 MED ORDER — WARFARIN SODIUM 10 MG PO TABS
10.0000 mg | ORAL_TABLET | Freq: Every day | ORAL | Status: AC
  Administered 2018-08-29: 10 mg via ORAL
  Filled 2018-08-29: qty 1

## 2018-08-29 MED ORDER — INSULIN DETEMIR 100 UNIT/ML ~~LOC~~ SOLN
40.0000 [IU] | Freq: Every day | SUBCUTANEOUS | Status: DC
  Administered 2018-08-29 – 2018-09-02 (×5): 40 [IU] via SUBCUTANEOUS
  Filled 2018-08-29 (×5): qty 0.4

## 2018-08-29 NOTE — Progress Notes (Signed)
Progress Note  Patient Name: Marcus SpillerWilliam L Strahan Jr. Date of Encounter: 08/29/2018  Primary Cardiologist: Chrystie NoseKenneth C Hilty, MD   Subjective   Chest pain with cough.  Denies dyspnea.  Inpatient Medications    Scheduled Meds: . acetaminophen  1,000 mg Oral Q6H  . aspirin EC  325 mg Oral Daily   Or  . aspirin  324 mg Per Tube Daily  . atorvastatin  80 mg Oral q1800  . bisacodyl  10 mg Oral Daily   Or  . bisacodyl  10 mg Rectal Daily  . docusate sodium  200 mg Oral Daily  . furosemide  40 mg Oral Daily  . insulin aspart  0-24 Units Subcutaneous TID WC  . insulin aspart  6 Units Subcutaneous TID WC  . insulin detemir  30 Units Subcutaneous Daily  . metoprolol tartrate  25 mg Oral BID  . pantoprazole  40 mg Oral Daily  . ENSURE MAX PROTEIN  11 oz Oral BID  . sodium chloride flush  10-40 mL Intracatheter Q12H  . sodium chloride flush  3 mL Intravenous Q12H  . Warfarin - Physician Dosing Inpatient   Does not apply q1800   Continuous Infusions: . sodium chloride     PRN Meds: sodium chloride, HYDROmorphone, ondansetron (ZOFRAN) IV, sodium chloride flush, sodium chloride flush, traMADol   Vital Signs    Vitals:   08/29/18 0500 08/29/18 0600 08/29/18 0700 08/29/18 0733  BP: 127/86 (!) 127/94 131/85   Pulse: 93 (!) 108 93   Resp: (!) 25 (!) 30 (!) 24   Temp:    98.3 F (36.8 C)  TempSrc:    Oral  SpO2: 97% 92% 98%   Weight: (!) 143.4 kg     Height:        Intake/Output Summary (Last 24 hours) at 08/29/2018 0813 Last data filed at 08/29/2018 0734 Gross per 24 hour  Intake 1060 ml  Output 1520 ml  Net -460 ml   Filed Weights   08/27/18 0500 08/28/18 0400 08/29/18 0500  Weight: (!) 144 kg (!) 142.5 kg (!) 143.4 kg    Telemetry    Sinus with NSVT- Personally Reviewed   Physical Exam   GEN: No acute distress.   Neck: No JVD Cardiac: RRR, no murmurs, rubs, or gallops.  Respiratory: Clear to auscultation bilaterally. S/P sternotomy GI: Soft, nontender,  non-distended  MS: Trace edema Neuro:  Nonfocal  Psych: Normal affect   Labs    Chemistry Recent Labs  Lab 08/23/18 1843  08/26/18 0436 08/28/18 0627 08/29/18 0103  NA 136   < > 130* 134* 131*  K 3.9   < > 4.1 4.9 4.4  CL 100   < > 98 98 95*  CO2 26   < > 23 25 24   GLUCOSE 211*   < > 231* 185* 174*  BUN 12   < > 7 12 18   CREATININE 1.04   < > 0.95 1.12 1.18  CALCIUM 8.6*   < > 7.9* 8.4* 8.2*  PROT 6.6  --   --  6.7  --   ALBUMIN 2.8*  --   --  2.6*  --   AST 17  --   --  41  --   ALT 22  --   --  21  --   ALKPHOS 41  --   --  63  --   BILITOT 0.3  --   --  0.9  --   GFRNONAA >60   < > >  60 >60 >60  GFRAA >60   < > >60 >60 >60  ANIONGAP 10   < > 9 11 12    < > = values in this interval not displayed.     Hematology Recent Labs  Lab 08/25/18 1618 08/25/18 1619 08/26/18 0436 08/28/18 0324  WBC 20.5*  --  17.9* 16.8*  RBC 3.79*  --  3.41* 3.31*  HGB 10.4* 10.9* 9.5* 9.3*  HCT 32.4* 32.0* 29.2* 28.3*  MCV 85.5  --  85.6 85.5  MCH 27.4  --  27.9 28.1  MCHC 32.1  --  32.5 32.9  RDW 13.4  --  13.5 13.9  PLT 310  --  261 384    Radiology    Dg Chest 2 View  Result Date: 08/28/2018 CLINICAL DATA:  CABG 4 days ago. EXAM: CHEST - 2 VIEW COMPARISON:  One-view chest x-ray 08/26/2018 FINDINGS: The heart is enlarged. Lung volumes are low. The right IJ sheath was removed. There is no edema or effusion. Mild bibasilar atelectasis remains. IMPRESSION: 1. Cardiomegaly without failure. 2. Interval removal of right IJ sheath. Electronically Signed   By: Marin Robertshristopher  Mattern M.D.   On: 08/28/2018 07:42    Patient Profile     48 y.o. male a hx of recurrent DVT/PE, DM2, GERD, arthritiswho is being seen for the evaluation of LV thrombusat the request of Dr. Elesa MassedWard.  He has had R iliac, femoral, popliteal, and tibial thromboembolectomy acutely this admission.  Also now status post CABG and LV thrombectomy.    Assessment & Plan    1 coronary artery disease status post coronary  artery bypass graft-plan to continue aspirin and statin.  2 ischemic cardiomyopathy-ejection fraction 35 to 40% by echocardiogram and 38% by cardiac MRI.  Continue present dose of metoprolol.  Transition to Toprol prior to discharge.  I will add losartan 25 mg daily.  Follow blood pressure and advance regimen as needed.  He will need repeat echocardiogram 3 months following discharge to reassess LV function.  If ejection fraction less than 35% would need to consider ICD.  3 LV thrombus-status post thrombectomy.  Continue Coumadin.  Given history of recurrent DVT/pulmonary emboli and now LV thrombus patient will need lifelong anticoagulation.  Will consider transitioning to DOAC as outpatient if patient can afford.  4 status post embolic event to right lower extremity-status post thrombectomy.  Follow-up vascular surgery.  Continue Coumadin.  For questions or updates, please contact [facility] Please consult [email or web address] for contact info under        Signed, Olga MillersBrian Emmalene Kattner, MD  08/29/2018, 8:13 AM

## 2018-08-29 NOTE — Progress Notes (Signed)
5 Days Post-Op Procedure(s) (LRB): LEFT VENTRICULATOMY WITH REMOVAL OF LV CLOT,  CORONARY ARTERY BYPASS GRAFTING (CABG)  times TWO USING LEFT INTERNAL MAMMARY ARTERY TO LAD AND VEIN GRAFT TO 1ST DIAG. (N/A) TRANSESOPHAGEAL ECHOCARDIOGRAM (TEE) (N/A) ENDOVEIN HARVEST OF GREATER SAPHENOUS VEIN (Right) Subjective:  No complaints. Ambulating well  Objective: Vital signs in last 24 hours: Temp:  [98.3 F (36.8 C)-100.2 F (37.9 C)] 98.3 F (36.8 C) (01/01 0733) Pulse Rate:  [53-108] 96 (01/01 0849) Cardiac Rhythm: Normal sinus rhythm (01/01 0821) Resp:  [15-41] 17 (01/01 0821) BP: (105-133)/(69-96) 118/88 (01/01 0849) SpO2:  [11 %-100 %] 100 % (01/01 0821) Weight:  [143.4 kg] 143.4 kg (01/01 0500)  Hemodynamic parameters for last 24 hours:    Intake/Output from previous day: 12/31 0701 - 01/01 0700 In: 1060 [P.O.:1060] Out: 1460 [Urine:1460] Intake/Output this shift: Total I/O In: 240 [P.O.:240] Out: 310 [Urine:310]  General appearance: alert and cooperative Heart: regular rate and rhythm, S1, S2 normal, no murmur, click, rub or gallop Lungs: clear to auscultation bilaterally Extremities: edema trace Wound: chest incision ok  Lab Results: Recent Labs    08/28/18 0324  WBC 16.8*  HGB 9.3*  HCT 28.3*  PLT 384   BMET:  Recent Labs    08/28/18 0627 08/29/18 0103  NA 134* 131*  K 4.9 4.4  CL 98 95*  CO2 25 24  GLUCOSE 185* 174*  BUN 12 18  CREATININE 1.12 1.18  CALCIUM 8.4* 8.2*    PT/INR:  Recent Labs    08/29/18 0103  LABPROT 14.3  INR 1.12   ABG    Component Value Date/Time   PHART 7.414 08/24/2018 2104   HCO3 24.3 08/24/2018 2104   TCO2 26 08/25/2018 1619   ACIDBASEDEF 1.0 08/24/2018 1930   O2SAT 47.4 08/27/2018 0329   CBG (last 3)  Recent Labs    08/28/18 0636 08/28/18 1221 08/28/18 1634  GLUCAP 138* 223* 234*    Assessment/Plan: S/P Procedure(s) (LRB): LEFT VENTRICULATOMY WITH REMOVAL OF LV CLOT,  CORONARY ARTERY BYPASS GRAFTING  (CABG)  times TWO USING LEFT INTERNAL MAMMARY ARTERY TO LAD AND VEIN GRAFT TO 1ST DIAG. (N/A) TRANSESOPHAGEAL ECHOCARDIOGRAM (TEE) (N/A) ENDOVEIN HARVEST OF GREATER SAPHENOUS VEIN (Right)  POD 5 Hemodynamically stable on Lopressor. Cozaar added today by cardiology. EF 35-40%. Transition to Toprol prior to discharge.  INR has not bumped yet. Will give 10 mg Coumadin today. 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 Will need lifelong anticoagulation. Plan is Coumadin for now and eventual transition to DOAC if feasible.  DM: glucose still low 200's at times. Will increase Levemir to 40.  Awaiting bed on 4E.   LOS: 10 days    Alleen BorneBryan K Tian Mcmurtrey 08/29/2018

## 2018-08-30 LAB — PROTIME-INR
INR: 1.41
Prothrombin Time: 17.1 seconds — ABNORMAL HIGH (ref 11.4–15.2)

## 2018-08-30 LAB — GLUCOSE, CAPILLARY
Glucose-Capillary: 102 mg/dL — ABNORMAL HIGH (ref 70–99)
Glucose-Capillary: 170 mg/dL — ABNORMAL HIGH (ref 70–99)
Glucose-Capillary: 159 mg/dL — ABNORMAL HIGH (ref 70–99)
Glucose-Capillary: 111 mg/dL — ABNORMAL HIGH (ref 70–99)
Glucose-Capillary: 128 mg/dL — ABNORMAL HIGH (ref 70–99)

## 2018-08-30 MED ORDER — WARFARIN SODIUM 10 MG PO TABS
10.0000 mg | ORAL_TABLET | Freq: Every day | ORAL | Status: AC
  Administered 2018-08-30: 10 mg via ORAL
  Filled 2018-08-30: qty 1

## 2018-08-30 MED ORDER — GUAIFENESIN-DM 100-10 MG/5ML PO SYRP
5.0000 mL | ORAL_SOLUTION | ORAL | Status: DC | PRN
  Administered 2018-08-30: 5 mL via ORAL
  Filled 2018-08-30: qty 5

## 2018-08-30 NOTE — Op Note (Signed)
NAME: Marcus Joseph, Marcus Joseph MEDICAL RECORD [id number] ACCOUNT [id number] DATE OF BIRTH:1970/09/19 FACILITY: [facility] LOCATION: [facility] PHYSICIAN:Kimiyo Carmicheal Bari Finnigan Warriner, MD  OPERATIVE REPORT  DATE OF PROCEDURE:  08/24/2018  PREOPERATIVE DIAGNOSES:   1.  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 2.  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  POSTOPERATIVE DIAGNOSES:  1.  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 2.  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   SURGICAL PROCEDURE:   1.  Left ventricular ventriculotomy with removal of large mobile left ventricular clot 2.  Coronary artery bypass grafting x2 with the left internal mammary to the left anterior descending coronary artery and reverse saphenous vein graft to the diagonal coronary artery with right thigh endoscopic vein harvesting of the right greater  saphenous vein.  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  He had been intermittently anticoagulated over the years, but at the time of admission, he had discontinued regular usage of Xarelto.  The  patient had a previous history of DVT several months after a right knee replacement 4 years previously.  He presented with a saddle pulmonary embolus in 04/2018 and was started on Xarelto again at that time; however, his ability to obtain this medication  was erratic then presented the week prior to surgery with pain in his right calf.  He came to the emergency room thinking that he likely had DVT again.  Further evaluation revealed decreased blood flow in his right  distal vessels at the ankle.  In  addition, CT scan of the chest suggested a large LV clot.  The patient was initially heparinized.  He was seen by Dr. Chestine Spore who performed an embolectomy and removed clot from the right leg from the femoral artery with restoration of palpable pulses.  The  patient underwent cardiac catheterization that revealed a chronically occluded LAD with some collateral filling from the right and also 80% stenosis of a relatively small diagonal branch.  LV function was depressed at approximately 35% ejection  fraction.  With the patient's 5 cm mobile clot with concomitant coronary artery disease, it was felt most likely that he had an at-hospital myocardial infarction in the past, at least several months ago.  We recommended to him that we proceed with  coronary artery bypass grafting and left ventricular laparotomy and removal of the clot.  The patient agreed and signed informed consent.  DESCRIPTION OF PROCEDURE:  With Swan-Ganz and arterial line monitors in place, the patient underwent general endotracheal anesthesia.  TEE probe was placed by Dr. Bradley Ferris.  This confirmed the preoperative studies of the large mobile 5.5 cm x 1.5 cm  mobile clot.  With a TEE, we could discern an area of attachment at the apex and also along the septum.  Ejection fraction was depressed with anterior apical hypokinesis.  The patient had no valvular disease by echo.  The skin of the chest and legs was  prepped with Betadine, draped in the usual sterile manner.  Appropriate timeout was performed and we proceeded with harvesting a segment of reverse saphenous vein graft, right greater saphenous, from the right thigh.  Median sternotomy was  performed.   Left internal mammary artery was dissected down as a pedicle graft.  The distal artery was divided and had good free flow.  Pericardium was opened.  Overall, ventricular function appeared as suggested on echo with some anterior hypokinesis.  Though the   anterior wall was not scarred, but appeared to have viable muscle.  The patient was systemically heparinized.  The ascending aorta was cannulated.  The right atrium was cannulated.  An aortic root vent cardioplegia needle was introduced into the  ascending aorta.  The patient was placed on cardiopulmonary bypass at 2.4 liters per minute per meter square.  A 22 metal tipped right angle arterial cannula had been used to obtain higher flows with the patient's large body size.  We were careful not to  manipulate or lift heart and more than necessary at this point.  The patient's body temperature was cooled to 32 degrees.  Aortic crossclamp was applied; 500 mL cold blood potassium cardioplegia was administered with diastolic arrest of the heart.   Based on the TEE, a 2 cm ventriculotomy was performed toward the apex of the heart based on our findings from the TEE and points of attachment.  This gave good visualization of a large clot.  There was trap width within the trabeculations along the  septum and at the apex with division of some of some very small chordae that ran through the clot.  We were able to free the clot and remove it intact in its entirety.  The ventricle was carefully flushed for any loose debris.  We then closed the  ventriculotomy over felt strips with horizontal mattress #2 Ethibond sutures and then a second layer of running Prolene suture.  With the ventriculotomy closed, we then turned our attention to the coronary artery bypass grafting.  The LAD was opened in  its midportion and was a good quality vessel.  The diagonal was opened and was a smaller vessel, but admitted 1.5 mm probe distally.  Using a segment of reverse saphenous vein graft, anastomosis to the diagonal coronary artery was performed.  We then  anastomosed to left anterior descending coronary artery to the LAD.  With the clamp still in place, a single punch aortotomy was performed and the diagonal graft was anastomosed to  the ascending aorta.  The heart was allowed to passively fill and deair  through the proximal anastomosis.  The bulldog on the mammary artery was removed with prompt rise in myocardial septal temperature.  Proximal anastomosis was completed and aortic crossclamp was removed.  The patient spontaneously converted to a sinus  rhythm.  Sites of anastomoses were inspected and tracheotomy site was inspected and was intact.  The patient was started on low dose dopamine and milrinone with the body temperature rewarmed to 37 degrees.  He was then ventilated and weaned from  cardiopulmonary bypass without difficulty.  He was decannulated in the usual fashion.  Protamine sulfate was administered with operative field hemostatic, atrial and ventricular pacing wires were applied.  A graft marker was applied.  A left pleural tube  and a Blake mediastinal drain were left in place.  Pericardium was loosely reapproximated.  Sternum was closed with #6 stainless steel wire.  Fascia closed with interrupted 0 Vicryl, running 3-0 Vicryl, subcutaneous tissue and 4-0 subcuticular stitches  in the skin edges.  Dry dressings were applied.  Sponge and needle count was reported as correct at completion of procedure.  The patient tolerated the procedure without obvious complication and  was transferred to the surgical intensive care unit for  further postoperative care.  RF tag scanning reported clear.  The patient did not require any blood products during the operative procedure.                                    TN/NUANCE  D:08/30/2018 T:08/30/2018 JOB:[id number]

## 2018-08-30 NOTE — Progress Notes (Signed)
Spoke with patient about his diabetes and insulin.  Patient states that he has Lantus and Novolog in the vials at home that he just received before coming to the hospital. Will be calling for a follow up appointment with his PCP at discharge. Waiting on getting Medicaid for insurance coverage. Would like to use insulin pen, but it was suggested to him that he use what insulin he has at home for now.    Smith MinceKendra Argyle Gustafson RN BSN CDE Diabetes Coordinator Pager: [phone]  8am-5pm

## 2018-08-30 NOTE — Progress Notes (Signed)
CARDIAC REHAB PHASE I   PRE:  Rate/Rhythm: 93 SR  BP:  Sitting: 128/71      SaO2: 95 RA  MODE:  Ambulation: 470 ft 118 peak HR  POST:  Rate/Rhythm: 95 SR  BP:  Sitting: 131/82    SaO2: 94 RA   Pt ambulated 4670ft in hallway assist of one with rollator. Pt took one sitting rest break. Pt demonstrated 1100 on IS. Encouraged continued IS use and coughs. Reinforced sternal precautions. Talked with pt about importance of coumadin. Will continue to follow and encourage ambulation.  [id number] Marcus Boweneresa  [name], RN BSN 08/30/2018 10:33 AM

## 2018-08-30 NOTE — Plan of Care (Signed)
  Problem: Education: Goal: Knowledge of General Education information will improve Description: Including pain rating scale, medication(s)/side effects and non-pharmacologic comfort measures Outcome: Progressing   Problem: Health Behavior/Discharge Planning: Goal: Ability to manage health-related needs will improve Outcome: Progressing   Problem: Clinical Measurements: Goal: Ability to maintain clinical measurements within normal limits will improve Outcome: Progressing Goal: Will remain free from infection Outcome: Progressing Goal: Diagnostic test results will improve Outcome: Progressing Goal: Respiratory complications will improve Outcome: Progressing Goal: Cardiovascular complication will be avoided Outcome: Progressing   Problem: Activity: Goal: Risk for activity intolerance will decrease Outcome: Progressing   Problem: Elimination: Goal: Will not experience complications related to bowel motility Outcome: Progressing Goal: Will not experience complications related to urinary retention Outcome: Progressing   Problem: Pain Managment: Goal: General experience of comfort will improve Outcome: Progressing   

## 2018-08-30 NOTE — Progress Notes (Signed)
Pt ambulated in halls this am approx 940 ft w/ walker. Tolerated well. Pt changed own gauze dressing on midsternal incision. Scan serosanguinous drainage. Will continue to monitor.  Margarito Liner, RN

## 2018-08-30 NOTE — Progress Notes (Signed)
[address]       [place] [id number]             [phone]      6 Days Post-Op Procedure(s) (LRB): LEFT VENTRICULATOMY WITH REMOVAL OF LV CLOT,  CORONARY ARTERY BYPASS GRAFTING (CABG)  times TWO USING LEFT INTERNAL MAMMARY ARTERY TO LAD AND VEIN GRAFT TO 1ST DIAG. (N/A) TRANSESOPHAGEAL ECHOCARDIOGRAM (TEE) (N/A) ENDOVEIN HARVEST OF GREATER SAPHENOUS VEIN (Right) Subjective: Some productive cough Some incisional discomfort   Objective: Vital signs in last 24 hours: Temp:  [98 F (36.7 C)-98.8 F (37.1 C)] 98.8 F (37.1 C) (01/02 0411) Pulse Rate:  [88-105] 89 (01/02 0411) Cardiac Rhythm: Normal sinus rhythm (01/01 1900) Resp:  [16-21] 20 (01/02 0411) BP: (102-135)/(70-93) 135/93 (01/02 0411) SpO2:  [96 %-100 %] 100 % (01/02 0411) Weight:  [142.2 kg] 142.2 kg (01/02 0411)  Hemodynamic parameters for last 24 hours:    Intake/Output from previous day: 01/01 0701 - 01/02 0700 In: 440 [P.O.:440] Out: 1460 [Urine:1460] Intake/Output this shift: No intake/output data recorded.  General appearance: alert, cooperative and no distress Heart: regular rate and rhythm Lungs: min dim in bases Abdomen: soft, nontender, + BS Extremities: + LE edema Wound: incis healing well  Lab Results: Recent Labs    08/28/18 0324  WBC 16.8*  HGB 9.3*  HCT 28.3*  PLT 384   BMET:  Recent Labs    08/28/18 0627 08/29/18 0103  NA 134* 131*  K 4.9 4.4  CL 98 95*  CO2 25 24  GLUCOSE 185* 174*  BUN 12 18  CREATININE 1.12 1.18  CALCIUM 8.4* 8.2*    PT/INR:  Recent Labs    08/30/18 0247  LABPROT 17.1*  INR 1.41   ABG    Component Value Date/Time   PHART 7.414 08/24/2018 2104   HCO3 24.3 08/24/2018 2104   TCO2 26 08/25/2018 1619   ACIDBASEDEF 1.0 08/24/2018 1930   O2SAT 47.4 08/27/2018 0329   CBG (last 3)  Recent Labs    08/29/18 1623 08/29/18 2148 08/30/18 0606  GLUCAP 90 121* 102*    Meds Scheduled Meds: . aspirin EC  325 mg Oral  Daily   Or  . aspirin  324 mg Per Tube Daily  . atorvastatin  80 mg Oral q1800  . bisacodyl  10 mg Oral Daily   Or  . bisacodyl  10 mg Rectal Daily  . docusate sodium  200 mg Oral Daily  . furosemide  40 mg Oral Daily  . insulin aspart  0-24 Units Subcutaneous TID WC  . insulin aspart  6 Units Subcutaneous TID WC  . insulin detemir  40 Units Subcutaneous Daily  . losartan  25 mg Oral Daily  . metoprolol tartrate  25 mg Oral BID  . pantoprazole  40 mg Oral Daily  . ENSURE MAX PROTEIN  11 oz Oral BID  . sodium chloride flush  3 mL Intravenous Q12H  . Warfarin - Physician Dosing Inpatient   Does not apply q1800   Continuous Infusions: . sodium chloride     PRN Meds:.sodium chloride, HYDROmorphone, ondansetron (ZOFRAN) IV, sodium chloride flush, traMADol  Xrays Dg Chest 2 View  Result Date: 08/28/2018 CLINICAL DATA:  CABG 4 days ago. EXAM: CHEST - 2 VIEW COMPARISON:  One-view chest x-ray 08/26/2018 FINDINGS: The heart is enlarged. Lung volumes are low. The right IJ sheath was removed. There is no edema or effusion. Mild bibasilar atelectasis remains. IMPRESSION: 1. Cardiomegaly without  failure. 2. Interval removal of right IJ sheath. Electronically Signed   By: Marin Roberts M.D.   On: 08/28/2018 07:42    Assessment/Plan: S/P Procedure(s) (LRB): LEFT VENTRICULATOMY WITH REMOVAL OF LV CLOT,  CORONARY ARTERY BYPASS GRAFTING (CABG)  times TWO USING LEFT INTERNAL MAMMARY ARTERY TO LAD AND VEIN GRAFT TO 1ST DIAG. (N/A) TRANSESOPHAGEAL ECHOCARDIOGRAM (TEE) (N/A) ENDOVEIN HARVEST OF GREATER SAPHENOUS VEIN (Right)  1 doing well overall 2 hemodyn stable, sinus with PVC's 3 sats good on RA 4 BS control is good on current RX, poor preop control- will ask DM coordinator to see and assist with management 5 INR 1.41- will give 10 mg coumadin again today as just starting to rise 6 routine pulm toilet/rehab 7 repeat labs in am  LOS: 11 days    Rowe Clack [facility] 08/30/2018 Pager [phone]

## 2018-08-30 NOTE — Progress Notes (Signed)
Pt ambulated 300 feet with the assist of a rollator. Pt tolerated activity well and is now resting in bed. Will continue to monitor.

## 2018-08-31 LAB — BASIC METABOLIC PANEL
Anion gap: 10 (ref 5–15)
BUN: 20 mg/dL (ref 6–20)
CO2: 27 mmol/L (ref 22–32)
Calcium: 8.3 mg/dL — ABNORMAL LOW (ref 8.9–10.3)
Chloride: 97 mmol/L — ABNORMAL LOW (ref 98–111)
Creatinine, Ser: 1.01 mg/dL (ref 0.61–1.24)
GFR calc Af Amer: >60 mL/min (ref >60)
GLUCOSE: 155 mg/dL — AB (ref 70–99)
Potassium: 4.3 mmol/L (ref 3.5–5.1)
Sodium: 134 mmol/L — ABNORMAL LOW (ref 135–145)

## 2018-08-31 LAB — GLUCOSE, CAPILLARY
Glucose-Capillary: 113 mg/dL — ABNORMAL HIGH (ref 70–99)
Glucose-Capillary: 137 mg/dL — ABNORMAL HIGH (ref 70–99)
Glucose-Capillary: 100 mg/dL — ABNORMAL HIGH (ref 70–99)
Glucose-Capillary: 131 mg/dL — ABNORMAL HIGH (ref 70–99)

## 2018-08-31 LAB — CBC
HCT: 26.7 % — ABNORMAL LOW (ref 39.0–52.0)
HEMOGLOBIN: 8.7 g/dL — AB (ref 13.0–17.0)
MCH: 28 pg (ref 26.0–34.0)
MCHC: 32.6 g/dL (ref 30.0–36.0)
MCV: 85.9 fL (ref 80.0–100.0)
Platelets: 545 10*3/uL — ABNORMAL HIGH (ref 150–400)
RBC: 3.11 MIL/uL — ABNORMAL LOW (ref 4.22–5.81)
RDW: 14.1 % (ref 11.5–15.5)
WBC: 13.5 10*3/uL — ABNORMAL HIGH (ref 4.0–10.5)
nRBC: 0 % (ref 0.0–0.2)

## 2018-08-31 LAB — PROTIME-INR
INR: 1.39
Prothrombin Time: 16.9 seconds — ABNORMAL HIGH (ref 11.4–15.2)

## 2018-08-31 MED ORDER — WARFARIN SODIUM 2.5 MG PO TABS
12.5000 mg | ORAL_TABLET | Freq: Every day | ORAL | Status: AC
  Administered 2018-08-31: 12.5 mg via ORAL
  Filled 2018-08-31: qty 1

## 2018-08-31 NOTE — Progress Notes (Signed)
Nutrition Follow Up  DOCUMENTATION CODES:   Morbid obesity  INTERVENTION:    Ensure Max po BID, each supplement provides 150 kcal and 30 grams of protein  NEW NUTRITION DIAGNOSIS:   Increased nutrient needs related to acute illness as evidenced by estimated needs, ongoing  GOAL:   Patient will meet greater than or equal to 90% of their needs, met  MONITOR:   PO intake, Supplement acceptance, Weight trends, Labs  ASSESSMENT:   [name] with PMH of insulin-dependent DM, R leg DVT, and recurrent PE. He presented to the ED with complaints of acute onset of substernal chest pain, SOB, and RLE numbness.   12/27 s/p CABG x 2  Pt continues on a HH/Carbohydrate Modified diet. PO intake much improved; 100% per flowsheet records. He is drinking his Ensure Max nutrition supplements. Labs & medications reviewed. Na 134 (L). CBG's [phone].  Diet Order:   Diet Order            Diet heart healthy/carb modified Room service appropriate? Yes; Fluid consistency: Thin  Diet effective now             EDUCATION NEEDS:   Not appropriate for education at this time  Skin:  Wound VAC discontinued  Last BM:  1/1  Height:   Ht Readings from Last 1 Encounters:  08/21/18 _0  (1.803 m)   Weight:   Wt Readings from Last 1 Encounters:  08/31/18 (!) 141.8 kg   Ideal Body Weight:  78.18 kg  BMI:  Body mass index is 43.6 kg/m.  Estimated Nutritional Needs:   Kcal:  2200-2400  Protein:  120-135 gm  Fluid:  2.2-2.4 L  Arthur Holms, RD, LDN Pager #: [phone] After-Hours Pager #: [phone]

## 2018-08-31 NOTE — Progress Notes (Signed)
CARDIAC REHAB PHASE I   PRE:  Rate/Rhythm: 91 SR PVCs  BP:  Sitting: 136/80      SaO2: 98% RA  MODE:  Ambulation: 470 ft   POST:  Rate/Rhythm: 97 SR PVCs  BP:  Sitting: 155/90      SaO2: 98% RA  Pt ambulated 470 ft with rollator and standby assist. Pt complained of foot numbness. Educated Pt and family on sternal precautions, incision care, restrictions, exercise guidelines, IS use, nutrition, and CRPII. Referral sent to [facility] CRPII.   [phone]  Tomasita Crumble BS, ACSM CEP  10:02 AM 08/31/2018

## 2018-08-31 NOTE — Progress Notes (Addendum)
[address]       [place] [address]             [phone]      7 Days Post-Op Procedure(s) (LRB): LEFT VENTRICULATOMY WITH REMOVAL OF LV CLOT,  CORONARY ARTERY BYPASS GRAFTING (CABG)  times TWO USING LEFT INTERNAL MAMMARY ARTERY TO LAD AND VEIN GRAFT TO 1ST DIAG. (N/A) TRANSESOPHAGEAL ECHOCARDIOGRAM (TEE) (N/A) ENDOVEIN HARVEST OF GREATER SAPHENOUS VEIN (Right) Subjective: Feels well overall  Objective: Vital signs in last 24 hours: Temp:  [97.4 F (36.3 C)-99.1 F (37.3 C)] 98.8 F (37.1 C) (01/03 0356) Pulse Rate:  [79-90] 86 (01/03 0359) Cardiac Rhythm: Normal sinus rhythm (01/03 0700) Resp:  [17-26] 18 (01/03 0356) BP: (106-139)/(74-99) 130/99 (01/03 0359) SpO2:  [99 %-100 %] 100 % (01/03 0359) Weight:  [141.8 kg] 141.8 kg (01/03 0359)  Hemodynamic parameters for last 24 hours:    Intake/Output from previous day: 01/02 0701 - 01/03 0700 In: -  Out: 1975 [Urine:1975] Intake/Output this shift: No intake/output data recorded.  General appearance: alert, cooperative and no distress Heart: regular rate and rhythm and occas extrasystole Lungs: clear to auscultation bilaterally Abdomen: obese, benign Extremities: + edema Wound: incis healing well  Lab Results: Recent Labs    08/31/18 0403  WBC 13.5*  HGB 8.7*  HCT 26.7*  PLT 545*   BMET:  Recent Labs    08/29/18 0103 08/31/18 0403  NA 131* 134*  K 4.4 4.3  CL 95* 97*  CO2 24 27  GLUCOSE 174* 155*  BUN 18 20  CREATININE 1.18 1.01  CALCIUM 8.2* 8.3*    PT/INR:  Recent Labs    08/31/18 0403  LABPROT 16.9*  INR 1.39   ABG    Component Value Date/Time   PHART 7.414 08/24/2018 2104   HCO3 24.3 08/24/2018 2104   TCO2 26 08/25/2018 1619   ACIDBASEDEF 1.0 08/24/2018 1930   O2SAT 47.4 08/27/2018 0329   CBG (last 3)  Recent Labs    08/30/18 1629 08/30/18 2236 08/31/18 0630  GLUCAP 111* 128* 113*    Meds Scheduled Meds: . aspirin EC  325 mg Oral Daily   Or  .  aspirin  324 mg Per Tube Daily  . atorvastatin  80 mg Oral q1800  . bisacodyl  10 mg Oral Daily   Or  . bisacodyl  10 mg Rectal Daily  . docusate sodium  200 mg Oral Daily  . furosemide  40 mg Oral Daily  . insulin aspart  0-24 Units Subcutaneous TID WC  . insulin aspart  6 Units Subcutaneous TID WC  . insulin detemir  40 Units Subcutaneous Daily  . losartan  25 mg Oral Daily  . metoprolol tartrate  25 mg Oral BID  . pantoprazole  40 mg Oral Daily  . ENSURE MAX PROTEIN  11 oz Oral BID  . sodium chloride flush  3 mL Intravenous Q12H  . Warfarin - Physician Dosing Inpatient   Does not apply q1800   Continuous Infusions: . sodium chloride     PRN Meds:.sodium chloride, guaiFENesin-dextromethorphan, HYDROmorphone, ondansetron (ZOFRAN) IV, sodium chloride flush, traMADol  Xrays No results found.  Assessment/Plan: S/P Procedure(s) (LRB): LEFT VENTRICULATOMY WITH REMOVAL OF LV CLOT,  CORONARY ARTERY BYPASS GRAFTING (CABG)  times TWO USING LEFT INTERNAL MAMMARY ARTERY TO LAD AND VEIN GRAFT TO 1ST DIAG. (N/A) TRANSESOPHAGEAL ECHOCARDIOGRAM (TEE) (N/A) ENDOVEIN HARVEST OF GREATER SAPHENOUS VEIN (Right)  1 conts to progress 2 hemodyn stable in  sinus with PVC's, recent magnesium and Potassium ok, transition to toprol per cardiol  3 renal fxn normal, cont daily lasix with some cont volume overload 4 INR dropped a little, was trending in right direction, will go to 12.5 mg Needs to be closer to >2.0 with significant H/O clotting 5 BS controlled - plan outlined by DM coordinator to use lantus and novolog that he has at home 6 cont rehab and pulm toilet  LOS: 12 days    Rowe ClackWayne E Gold PA-C 08/31/2018 Pager [phone]  patient examined and medical record reviewed,agree with above note. Kathlee Nationseter Van Trigt III 08/31/2018

## 2018-08-31 NOTE — Progress Notes (Signed)
Patient ambulated twice in the hall on this shift, ambulation well tolerated will continue to monitor.  

## 2018-08-31 NOTE — Progress Notes (Signed)
Progress Note  Patient Name: Marcus Joseph. Date of Encounter: 08/31/2018  Primary Cardiologist:   Chrystie Nose, MD   Subjective   He denies any acute complaints.  Mild incisional chest pain.  No acute SOB.   Inpatient Medications    Scheduled Meds: . aspirin EC  325 mg Oral Daily   Or  . aspirin  324 mg Per Tube Daily  . atorvastatin  80 mg Oral q1800  . bisacodyl  10 mg Oral Daily   Or  . bisacodyl  10 mg Rectal Daily  . docusate sodium  200 mg Oral Daily  . insulin aspart  0-24 Units Subcutaneous TID WC  . insulin aspart  6 Units Subcutaneous TID WC  . insulin detemir  40 Units Subcutaneous Daily  . losartan  25 mg Oral Daily  . metoprolol tartrate  25 mg Oral BID  . pantoprazole  40 mg Oral Daily  . ENSURE MAX PROTEIN  11 oz Oral BID  . sodium chloride flush  3 mL Intravenous Q12H  . warfarin  12.5 mg Oral q1800  . Warfarin - Physician Dosing Inpatient   Does not apply q1800   Continuous Infusions: . sodium chloride     PRN Meds: sodium chloride, guaiFENesin-dextromethorphan, HYDROmorphone, ondansetron (ZOFRAN) IV, sodium chloride flush, traMADol   Vital Signs    Vitals:   08/31/18 0356 08/31/18 0359 08/31/18 0759 08/31/18 1123  BP: (!) 130/99 (!) 130/99 132/83 118/87  Pulse: 86 86 91 88  Resp: 18  (!) 21 19  Temp: 98.8 F (37.1 C)  98.4 F (36.9 C) 98.8 F (37.1 C)  TempSrc: Oral  Oral Oral  SpO2: 99% 100% 98% 98%  Weight:  (!) 141.8 kg    Height:        Intake/Output Summary (Last 24 hours) at 08/31/2018 1133 Last data filed at 08/31/2018 1124 Gross per 24 hour  Intake 240 ml  Output 2450 ml  Net -2210 ml   Filed Weights   08/29/18 0500 08/30/18 0411 08/31/18 0359  Weight: (!) 143.4 kg (!) 142.2 kg (!) 141.8 kg    Telemetry    NSR with rare PVCs. - Personally Reviewed  ECG    NA - Personally Reviewed  Physical Exam   GEN: No  acute distress.   Neck: No  JVD Cardiac: RRR, no murmurs, rubs, or gallops.  Respiratory: Clear   to auscultation bilaterally. GI: Soft, nontender, non-distended, normal bowel sounds  MS:  Mild leg edema; No deformity. Neuro:   Nonfocal  Psych: Oriented and appropriate   Labs    Chemistry Recent Labs  Lab 08/28/18 0627 08/29/18 0103 08/31/18 0403  NA 134* 131* 134*  K 4.9 4.4 4.3  CL 98 95* 97*  CO2 25 24 27   GLUCOSE 185* 174* 155*  BUN 12 18 20   CREATININE 1.12 1.18 1.01  CALCIUM 8.4* 8.2* 8.3*  PROT 6.7  --   --   ALBUMIN 2.6*  --   --   AST 41  --   --   ALT 21  --   --   ALKPHOS 63  --   --   BILITOT 0.9  --   --   GFRNONAA >60 >60 >60  GFRAA >60 >60 >60  ANIONGAP 11 12 10      Hematology Recent Labs  Lab 08/26/18 0436 08/28/18 0324 08/31/18 0403  WBC 17.9* 16.8* 13.5*  RBC 3.41* 3.31* 3.11*  HGB 9.5* 9.3* 8.7*  HCT 29.2* 28.3*  26.7*  MCV 85.6 85.5 85.9  MCH 27.9 28.1 28.0  MCHC 32.5 32.9 32.6  RDW 13.5 13.9 14.1  PLT 261 384 545*    Cardiac Enzymes No results for input(s): TROPONINI in the last 168 hours. No results for input(s): TROPIPOC in the last 168 hours.   BNPNo results for input(s): BNP, PROBNP in the last 168 hours.   DDimer No results for input(s): DDIMER in the last 168 hours.   Radiology    No results found.  Cardiac Studies   Cath:  12/26  Prox RCA lesion is 30% stenosed.  RPDA lesion is 30% stenosed.  Prox Cx to Mid Cx lesion is 30% stenosed.  Ost Ramus lesion is 30% stenosed.  Prox LAD lesion is 100% stenosed.  Ost 1st Diag lesion is 80% stenosed.  1st Diag lesion is 80% stenosed.    TTE 12/26  Study Conclusions  - Study data: Focused echocardiogram to assess LV thrombus. - Large apical thrombus is present. Largely unchanged appearance of   thrombus which measures 62 x 18 mm in approximate dimension.   Thrombus is mobile and extends toward the LVOT without   obstructing the mitral or aortic valves. - Left ventricle: The cavity size was moderately dilated. Wall   thickness was normal. Systolic function was  moderately reduced.   The estimated ejection fraction was in the range of 35% to 40%. - Regional wall motion abnormality: Akinesis of the apical   myocardium. - Right atrium: The atrium was normal in size. - Pericardium, extracardiac: There was no pericardial effusion. - Critical findings discussed with Dr. Rennis Golden at 1345 on 08/23/18.    Patient Profile     48 y.o. male a hx of recurrent DVT/PE, DM2, GERD, arthritiswho is being seen for the evaluation of LV thrombusat the request of Dr. Elesa Massed.  He has had R iliac, femoral, popliteal, and tibial thromboembolectomy acutely this admission.  Also now status post CABG and LV thrombectomy.    Assessment & Plan    CAD/CABG:  Doing well post up.  Per primary team.   RIGHT LOWER EXTREMITY THROMBECTOMY:  On warfarin. Dose increased today as below.    LV THROMBECTOMY:  Was to be on lifelong anticoagulation for recurrent PE.  Unable to afford Xarelto.  Continue warfarin.   INR has remained subtherapeutic and dose being adjusted by TCTS.    CARDIOMYOPATHY:  TTE suggested EF of 35% pre CABG.  However, TEE yesterday listed EF as normal.  However, I would suggest from my review of the TTE that the EF is about 40%.  Continue PO diuresis.  Continue low dose Losartan and beta blocker.   For questions or updates, please contact [facility] Please consult [email or web address] for contact info under Cardiology/STEMI.   Signed, Rollene Rotunda, MD  08/31/2018, 11:33 AM

## 2018-09-01 LAB — GLUCOSE, CAPILLARY
Glucose-Capillary: 117 mg/dL — ABNORMAL HIGH (ref 70–99)
Glucose-Capillary: 176 mg/dL — ABNORMAL HIGH (ref 70–99)
Glucose-Capillary: 111 mg/dL — ABNORMAL HIGH (ref 70–99)
Glucose-Capillary: 137 mg/dL — ABNORMAL HIGH (ref 70–99)

## 2018-09-01 LAB — PROTIME-INR
INR: 1.75
Prothrombin Time: 20.2 seconds — ABNORMAL HIGH (ref 11.4–15.2)

## 2018-09-01 MED ORDER — FUROSEMIDE 40 MG PO TABS
40.0000 mg | ORAL_TABLET | Freq: Every day | ORAL | Status: DC
  Administered 2018-09-01 – 2018-09-02 (×2): 40 mg via ORAL
  Filled 2018-09-01 (×2): qty 1

## 2018-09-01 MED ORDER — POTASSIUM CHLORIDE CRYS ER 20 MEQ PO TBCR
20.0000 meq | EXTENDED_RELEASE_TABLET | Freq: Every day | ORAL | Status: DC
  Administered 2018-09-01 – 2018-09-02 (×2): 20 meq via ORAL
  Filled 2018-09-01 (×2): qty 1

## 2018-09-01 MED ORDER — TRAMADOL HCL 50 MG PO TABS: 50.0000 mg | ORAL_TABLET | ORAL | 0 refills | Status: AC | PRN

## 2018-09-01 MED ORDER — WARFARIN SODIUM 10 MG PO TABS: 10.0000 mg | ORAL_TABLET | Freq: Once | ORAL | Status: DC

## 2018-09-01 MED ORDER — WARFARIN SODIUM 10 MG PO TABS: 10.0000 mg | ORAL_TABLET | Freq: Every day | ORAL | Status: DC

## 2018-09-01 MED ORDER — ATORVASTATIN CALCIUM 80 MG PO TABS: 80.0000 mg | ORAL_TABLET | Freq: Every day | ORAL | 1 refills | Status: DC

## 2018-09-01 MED ORDER — WARFARIN SODIUM 7.5 MG PO TABS
7.5000 mg | ORAL_TABLET | Freq: Once | ORAL | Status: AC
  Administered 2018-09-01: 7.5 mg via ORAL
  Filled 2018-09-01: qty 1

## 2018-09-01 MED ORDER — INSULIN GLARGINE 100 UNIT/ML SOLOSTAR PEN: 35.0000 [IU] | PEN_INJECTOR | Freq: Every day | SUBCUTANEOUS | 0 refills | Status: DC

## 2018-09-01 NOTE — Progress Notes (Signed)
CARDIAC REHAB PHASE I   PRE:  Rate/Rhythm: 94 SR  BP:  Supine:   Sitting: 124/81  Standing:    SaO2: 100% RA  MODE:  Ambulation: 470 ft   POST:  Rate/Rhythm: 117 ST  BP:  Supine:   Sitting: 174/82  Standing:    SaO2: 100% RA  [id number] Patient tolerated ambulation well pushing rollator. Gait fast paced, steady. To chair after walk, BP elevated. Pt very anxious to go home. Reviewed sternal precautions, IS use, and activity progression.   Artist Pais, MS, ACSM CEP

## 2018-09-01 NOTE — Progress Notes (Addendum)
      [address]       [place] [address]             [phone]        8 Days Post-Op Procedure(s) (LRB): LEFT VENTRICULATOMY WITH REMOVAL OF LV CLOT,  CORONARY ARTERY BYPASS GRAFTING (CABG)  times TWO USING LEFT INTERNAL MAMMARY ARTERY TO LAD AND VEIN GRAFT TO 1ST DIAG. (N/A) TRANSESOPHAGEAL ECHOCARDIOGRAM (TEE) (N/A) ENDOVEIN HARVEST OF GREATER SAPHENOUS VEIN (Right)  Subjective: Patient eating breakfast without complaints this am  Objective: Vital signs in last 24 hours: Temp:  [98 F (36.7 C)-98.8 F (37.1 C)] 98 F (36.7 C) (01/04 0440) Pulse Rate:  [79-91] 80 (01/04 0440) Cardiac Rhythm: Normal sinus rhythm (01/04 0700) Resp:  [13-25] 22 (01/04 0440) BP: (108-133)/(67-87) 132/85 (01/04 0440) SpO2:  [98 %-100 %] 100 % (01/04 0440) Weight:  [143.2 kg] 143.2 kg (01/04 0440)  Pre op weight  140.3 kg Current Weight  09/01/18 (!) 143.2 kg       Intake/Output from previous day: 01/03 0701 - 01/04 0700 In: 480 [P.O.:480] Out: 2411 [Urine:2410; Stool:1]   Physical Exam:  Cardiovascular: RRR Pulmonary: Slightly diminished at bases Abdomen: Soft, non tender, bowel sounds present. Extremities: Mild bilateral lower extremity edema R>L Wounds: RLE wound is clean and dry.  No erythema or signs of infection. Trace sero sanguinous drainage on lower sternal dressing but no sign of infection (likely fat necrosis).  Lab Results: CBC: Recent Labs    08/31/18 0403  WBC 13.5*  HGB 8.7*  HCT 26.7*  PLT 545*   BMET:  Recent Labs    08/31/18 0403  NA 134*  K 4.3  CL 97*  CO2 27  GLUCOSE 155*  BUN 20  CREATININE 1.01  CALCIUM 8.3*    PT/INR:  Lab Results  Component Value Date   INR 1.75 09/01/2018   INR 1.39 08/31/2018   INR 1.41 08/30/2018   ABG:  INR: Will add last result for INR, ABG once components are confirmed Will add last 4 CBG results once components are confirmed  Assessment/Plan:  1. CV - SR,PVCs in the 90's this am.  On  Lopressor 25 mg bid and Coumadin. INR increased from 1.39 to 1.75 (10 mg of Coumadin) so will give 7.5 mg tonight. 2.  Pulmonary - On room air. Encourage incentive spirometer 3.  Acute blood loss anemia - H and H yesterday 8.7 and 26.7 4. DM-CBGs 100/131/117. On Insulin 5. Likely discharge once INR closer to 2 and can determine Coumadin dose;hopefully, in am. Also, will decrease ecasa to 81 mg at dishcarge  Donielle M ZimmermanPA-C 09/01/2018,7:18 AM [phone]  Coumadin 7.5 mg tonight and check INR in a.m. Plan to discharge home tomorrow patient examined and medical record reviewed,agree with above note. Kathlee Nations Trigt III 09/01/2018

## 2018-09-02 LAB — GLUCOSE, CAPILLARY
GLUCOSE-CAPILLARY: 179 mg/dL — AB (ref 70–99)
Glucose-Capillary: 190 mg/dL — ABNORMAL HIGH (ref 70–99)

## 2018-09-02 LAB — PROTIME-INR
INR: 2.04
Prothrombin Time: 22.7 seconds — ABNORMAL HIGH (ref 11.4–15.2)

## 2018-09-02 MED ORDER — POTASSIUM CHLORIDE CRYS ER 20 MEQ PO TBCR: 20.0000 meq | EXTENDED_RELEASE_TABLET | Freq: Every day | ORAL | 0 refills | Status: DC

## 2018-09-02 MED ORDER — FUROSEMIDE 40 MG PO TABS: 40.0000 mg | ORAL_TABLET | Freq: Every day | ORAL | 0 refills | Status: DC

## 2018-09-02 MED ORDER — LOSARTAN POTASSIUM 25 MG PO TABS: 25.0000 mg | ORAL_TABLET | Freq: Every day | ORAL | 1 refills | Status: DC

## 2018-09-02 MED ORDER — WARFARIN SODIUM 10 MG PO TABS: 10.0000 mg | ORAL_TABLET | Freq: Every day | ORAL | 1 refills | Status: DC

## 2018-09-02 MED ORDER — METOPROLOL TARTRATE 25 MG PO TABS: 25.0000 mg | ORAL_TABLET | Freq: Two times a day (BID) | ORAL | 1 refills | Status: AC

## 2018-09-02 NOTE — Progress Notes (Addendum)
      [address]       [place] [address]             [phone]        9 Days Post-Op Procedure(s) (LRB): LEFT VENTRICULATOMY WITH REMOVAL OF LV CLOT,  CORONARY ARTERY BYPASS GRAFTING (CABG)  times TWO USING LEFT INTERNAL MAMMARY ARTERY TO LAD AND VEIN GRAFT TO 1ST DIAG. (N/A) TRANSESOPHAGEAL ECHOCARDIOGRAM (TEE) (N/A) ENDOVEIN HARVEST OF GREATER SAPHENOUS VEIN (Right)  Subjective: Patient without specific complaints this am. Parents at bedside and mom had several questions, all of which were answered.  Objective: Vital signs in last 24 hours: Temp:  [98 F (36.7 C)-98.5 F (36.9 C)] 98.5 F (36.9 C) (01/05 0538) Pulse Rate:  [72-84] 81 (01/05 0538) Cardiac Rhythm: Normal sinus rhythm (01/04 1900) Resp:  [15-25] 21 (01/05 0538) BP: (112-132)/(66-73) 132/66 (01/05 0538) SpO2:  [98 %-100 %] 100 % (01/05 0538) Weight:  [141.9 kg] 141.9 kg (01/05 0538)  Pre op weight  140.3 kg Current Weight  09/02/18 (!) 141.9 kg      Intake/Output from previous day: 01/04 0701 - 01/05 0700 In: 600 [P.O.:600] Out: 2350 [Urine:2350]   Physical Exam:  Cardiovascular: RRR Pulmonary: Slightly diminished at bases Abdomen: Soft, non tender, bowel sounds present. Extremities: Mild bilateral lower extremity edema R>L Wounds: RLE wound is clean and dry.  No erythema or signs of infection. Trace seroous drainage on lower sternal dressing but no sign of infection (likely fat necrosis).  Lab Results: CBC: Recent Labs    08/31/18 0403  WBC 13.5*  HGB 8.7*  HCT 26.7*  PLT 545*   BMET:  Recent Labs    08/31/18 0403  NA 134*  K 4.3  CL 97*  CO2 27  GLUCOSE 155*  BUN 20  CREATININE 1.01  CALCIUM 8.3*    PT/INR:  Lab Results  Component Value Date   INR 2.04 09/02/2018   INR 1.75 09/01/2018   INR 1.39 08/31/2018   ABG:  INR: Will add last result for INR, ABG once components are confirmed Will add last 4 CBG results once components are  confirmed  Assessment/Plan:  1. CV - SR,PVCs in the 90's this am. On  Lopressor 25 mg bid and Coumadin. INR increased from 1.75 to 2.04 (12.5 mg of Coumadin) so will discharge on 10 mg daily. Will decrease ecasa to 81 mg at dishcarge 2.  Pulmonary - On room air. Encourage incentive spirometer 3.  Acute blood loss anemia - Last H and H 8.7 and 26.7 4. DM-CBGs 100/131/117. On Insulin 5. Likely discharge   Lelon Huh ZimmermanPA-C 09/02/2018,7:23 AM [phone] Patient examined, INR now therapeutic Patient ready for discharge Small area of fat necrosis in midportion of sternal incision with minimal skin separation and drainage Instructed on instructions for wound care, activity limits, medications, and medications. Ready for discharge today

## 2018-09-02 NOTE — Progress Notes (Signed)
Chest tube sutures removed. Benzoin and steri strips applied. Patient tolerated well.

## 2018-09-02 NOTE — Progress Notes (Addendum)
IV and telemetry discontinued at this time. CCMD notified. Discharge instructions reviewed with patient. All questions answered.   1330: Patient states that he is supposed to go home with a walker. Left a message for case management call back.   1520: Left message for Morrie Sheldon, CM on call at this time.

## 2018-09-02 NOTE — Progress Notes (Signed)
Patient states he has some tingling in his finger tips on both hands. No other symptoms or problems. Doree Fudge, PA notified and aware.   Ernestina Columbia, RN

## 2018-09-02 NOTE — Care Management (Signed)
Pt requests RW. Pt is uninsured and will require a charity application.  [facility] will deliver to room when available.

## 2018-09-03 ENCOUNTER — Ambulatory Visit (INDEPENDENT_AMBULATORY_CARE_PROVIDER_SITE_OTHER): Payer: Self-pay | Admitting: Pharmacist

## 2018-09-03 DIAGNOSIS — I240 Acute coronary thrombosis not resulting in myocardial infarction: Secondary | ICD-10-CM

## 2018-09-03 DIAGNOSIS — D6859 Other primary thrombophilia: Secondary | ICD-10-CM | POA: Insufficient documentation

## 2018-09-03 DIAGNOSIS — Z7901 Long term (current) use of anticoagulants: Secondary | ICD-10-CM

## 2018-09-03 DIAGNOSIS — I513 Intracardiac thrombosis, not elsewhere classified: Secondary | ICD-10-CM

## 2018-09-03 LAB — POCT INR: INR: 3.1 — AB (ref 2.0–3.0)

## 2018-09-10 ENCOUNTER — Encounter: Payer: Self-pay | Admitting: Cardiology

## 2018-09-11 ENCOUNTER — Ambulatory Visit (INDEPENDENT_AMBULATORY_CARE_PROVIDER_SITE_OTHER): Payer: Self-pay | Admitting: Pharmacist

## 2018-09-11 ENCOUNTER — Ambulatory Visit (INDEPENDENT_AMBULATORY_CARE_PROVIDER_SITE_OTHER): Payer: Self-pay | Admitting: Cardiology

## 2018-09-11 ENCOUNTER — Encounter: Payer: Self-pay | Admitting: Cardiology

## 2018-09-11 VITALS — BP 90/62 | HR 97 | Ht 71.0 in | Wt 303.0 lb

## 2018-09-11 DIAGNOSIS — I513 Intracardiac thrombosis, not elsewhere classified: Secondary | ICD-10-CM

## 2018-09-11 DIAGNOSIS — I743 Embolism and thrombosis of arteries of the lower extremities: Secondary | ICD-10-CM

## 2018-09-11 DIAGNOSIS — Z7901 Long term (current) use of anticoagulants: Secondary | ICD-10-CM

## 2018-09-11 DIAGNOSIS — D6859 Other primary thrombophilia: Secondary | ICD-10-CM

## 2018-09-11 DIAGNOSIS — I240 Acute coronary thrombosis not resulting in myocardial infarction: Secondary | ICD-10-CM

## 2018-09-11 DIAGNOSIS — I255 Ischemic cardiomyopathy: Secondary | ICD-10-CM

## 2018-09-11 DIAGNOSIS — I2510 Atherosclerotic heart disease of native coronary artery without angina pectoris: Secondary | ICD-10-CM

## 2018-09-11 LAB — POCT INR: INR: 5.7 — AB (ref 2.0–3.0)

## 2018-09-11 MED ORDER — FUROSEMIDE 40 MG PO TABS: 40.0000 mg | ORAL_TABLET | Freq: Every day | ORAL | 0 refills | Status: DC

## 2018-09-11 MED ORDER — POTASSIUM CHLORIDE CRYS ER 20 MEQ PO TBCR: 20.0000 meq | EXTENDED_RELEASE_TABLET | Freq: Every day | ORAL | 0 refills | Status: DC

## 2018-09-11 MED ORDER — LOSARTAN POTASSIUM 25 MG PO TABS: 25.0000 mg | ORAL_TABLET | Freq: Every day | ORAL | 0 refills | Status: DC

## 2018-09-11 NOTE — Assessment & Plan Note (Signed)
EF 35-40% pre op

## 2018-09-11 NOTE — Assessment & Plan Note (Signed)
Coumadin for multiple recurrent PE/DVT and LVT

## 2018-09-11 NOTE — Assessment & Plan Note (Signed)
LIMA-LAD, SVG-Dx and LVT resection 08/24/18

## 2018-09-11 NOTE — Assessment & Plan Note (Signed)
S/P Rt iliac, femoral, popliteal, and tibial embolectomy 08/18/18- pre CABG/ LVT thrombectomy

## 2018-09-11 NOTE — Progress Notes (Signed)
09/11/2018 Marcus Joseph.   Oct 17, 1970  [id number]  [facility] Primary Cardiologist: Marcus Joseph  HPI: Marcus Joseph is a pleasant 48 year old male who was seen in the office today as a postop follow-up.  The patient has a history of pulmonary embolism in 2016.  He was placed on Xarelto but only took it for about 30 days.  In September 2019 he presented with a DVT and pulmonary embolism.  He was again put on Xarelto.  He was seen by hematologist, Marcus Joseph at [facility] in September.  He did not follow-up.  He apparently came off his Xarelto as an outpatient because of cost.  He presented to the emergency room 08/17/2018 with chest pain and shortness of breath.  CAT scan revealed an acute pulmonary embolism and a large left ventricular thrombus.  He was admitted and placed on heparin.  Vascular exam revealed extensive arterial occlusion in the right leg.  On 08/18/2018 he underwent right iliac femoral popliteal and tibial thrombectomy.  Catheterization done 08/21/2018 revealed occlusion of his LAD after the diagonal and an 80% diagonal lesion.  There was 30% proximal RCA, 30% circumflex and 80% OM.  He underwent bypass grafting and left ventricular from ectomy on 08/24/2018.  He received an LIMA to the LAD and a vein graft to the diagonal.  His ejection fraction preop was 35 to 40%.  He is discharged on Coumadin therapy.  He is seen in the office today in follow-up.  Since discharge he has had one episode where he became short of breath at rest, it was pretty significant, they almost took him back to the emergency room.  It did not ended up resolving spontaneously.  He denies any tachycardia.  Has not had orthopnea.  He does have some lower extremity edema.  He was discharged on 5 days of diuretic but has finished this.   Current Outpatient Medications  Medication Sig Dispense Refill  . aspirin EC 81 MG tablet Take 1 tablet (81 mg total) by mouth daily.  30 tablet 1  . atorvastatin (LIPITOR) 80 MG tablet Take 1 tablet (80 mg total) by mouth daily at 6 PM. 30 tablet 1  . furosemide (LASIX) 40 MG tablet Take 1 tablet (40 mg total) by mouth daily. For 5 days then stop. 5 tablet 0  . insulin aspart (NOVOLOG) 100 UNIT/ML FlexPen Inject 6 Units into the skin 3 (three) times daily with meals. 15 mL 0  . Insulin Glargine (LANTUS) 100 UNIT/ML Solostar Pen Inject 35 Units into the skin daily. 15 mL 0  . Insulin Pen Needle 32G X 4 MM MISC 1 Device by Does not apply route 4 (four) times daily. For use with insulin pens 100 each 0  . losartan (COZAAR) 25 MG tablet Take 1 tablet (25 mg total) by mouth at bedtime. 30 tablet 0  . metoprolol tartrate (LOPRESSOR) 25 MG tablet Take 1 tablet (25 mg total) by mouth 2 (two) times daily. 60 tablet 1  . traMADol (ULTRAM) 50 MG tablet Take 50 mg by mouth as needed.    . warfarin (COUMADIN) 10 MG tablet Take 1 tablet (10 mg total) by mouth daily. As directed 30 tablet 1  . potassium chloride SA (K-DUR,KLOR-CON) 20 MEQ tablet Take 1 tablet (20 mEq total) by mouth daily. For 5 days then stop. 5 tablet 0   No current facility-administered medications for this visit.     Allergies  Allergen Reactions  . Contrast  Media [Iodinated Diagnostic Agents] Itching    Pt states he thinks it makes him itch  . Lovenox [Enoxaparin Sodium] Itching    Itching over entire body  . Percocet [Oxycodone-Acetaminophen] Hives and Itching    Past Medical History:  Diagnosis Date  . Anemia age 89 or 7   none since  . Arthritis    oa  . Diabetes mellitus without complication (HCC)    newly diagnosed  . DVT (deep venous thrombosis) (HCC)   . GERD (gastroesophageal reflux disease)   . Head problem    back of top of head area swells at times, had glass in wound at times, pt instrcuted to follow up with md, if any pus seen  . Headache(784.0)    migraine  . Morbid obesity (HCC)   . PE (pulmonary embolism) 10/01/2014  . Prediabetes on  04-28-14    Social History   Socioeconomic History  . Marital status: Divorced    Spouse name: Not on file  . Number of children: Not on file  . Years of education: Not on file  . Highest education level: Not on file  Occupational History  . Not on file  Social Needs  . Financial resource strain: Not on file  . Food insecurity:    Worry: Not on file    Inability: Not on file  . Transportation needs:    Medical: Not on file    Non-medical: Not on file  Tobacco Use  . Smoking status: Never Smoker  . Smokeless tobacco: Never Used  Substance and Sexual Activity  . Alcohol use: Yes    Alcohol/week: 0.0 standard drinks    Comment: 2 x per month  . Drug use: No  . Sexual activity: Not on file  Lifestyle  . Physical activity:    Days per week: Not on file    Minutes per session: Not on file  . Stress: Not on file  Relationships  . Social connections:    Talks on phone: Not on file    Gets together: Not on file    Attends religious service: Not on file    Active member of club or organization: Not on file    Attends meetings of clubs or organizations: Not on file    Relationship status: Not on file  . Intimate partner violence:    Fear of current or ex partner: Not on file    Emotionally abused: Not on file    Physically abused: Not on file    Forced sexual activity: Not on file  Other Topics Concern  . Not on file  Social History Narrative  . Not on file     Family History  Problem Relation Age of Onset  . Hypertension Other   . Obesity Other   . Deep vein thrombosis Mother      Review of Systems: General: negative for chills, fever, night sweats or weight changes.  Cardiovascular: negative for chest pain, dyspnea on exertion, edema, orthopnea, palpitations, paroxysmal nocturnal dyspnea Dermatological: negative for rash Respiratory: negative for cough or wheezing Urologic: negative for hematuria Abdominal: negative for nausea, vomiting, diarrhea, bright red  blood per rectum, melena, or hematemesis Neurologic: negative for visual changes, syncope, or dizziness All other systems reviewed and are otherwise negative except as noted above.    Blood pressure 90/62, pulse 97, height 5\' 11"  (1.803 m), weight (!) 303 lb (137.4 kg), SpO2 99 %.  General appearance: alert, cooperative, no distress and morbidly obese Neck: no carotid  bruit and no JVD Lungs: clear to auscultation bilaterally Heart: regular rate and rhythm Extremities: 1+ LE edema Skin: serous drainage from pacer wire site and Rt leg site - no swelling or redness, no warmth Neurologic: Grossly normal  EKG NSR, HR 78, diffuse TWI, PVC, poor anterior RW  ASSESSMENT AND PLAN:   Long term (current) use of anticoagulants Coumadin for multiple recurrent PE/DVT and LVT  S/P CABG x 2, Resection of LV Thrombus LIMA-LAD, SVG-Dx and LVT resection 08/24/18   Type 2 diabetes mellitus with hyperglycemia (HCC) On Insulin  Morbid obesity (HCC) BMI 42  Ischemic cardiomyopathy EF 35-40% pre op  Arterial embolism of right leg (HCC) S/P Rt iliac, femoral, popliteal, and tibial embolectomy 08/18/18- pre CABG/ LVT thrombectomy   PLAN  Change losartan to Q HS secondary to low B/P. Resume lasix 40 mg and K+ 20 Meq x 5 days. F/U in 2-3 weeks, consider transition to [facility].   I did not think his wound sites were infected. They know to let us know if he develops any redness or swell at these sites.  He has a f/u with the surgeon next week.    Check INR today.  I encouraged the patient to f/u with his Hematologist.   Corine Shelter PA-C 09/11/2018 10:36 AM

## 2018-09-11 NOTE — Assessment & Plan Note (Signed)
On Insulin 

## 2018-09-11 NOTE — Patient Instructions (Addendum)
Medication Instructions:   RESTART LASIX 40 MG ONCE A DAY FOR FIVE DAYS RESTART POTASSIUM 20 MEQ ONCE A DAY FOR FIVE DAYS START LOSARTAN daily at bedtime   If you need a refill on your cardiac medications before your next appointment, please call your pharmacy.   Lab work: NONE  Testing/Procedures: NONE  Follow-Up:  Your physician recommends that you schedule a follow-up appointment to see Corine Shelter, PA-C in 3-4 week and You will need a follow up appointment in 3 months to see Chrystie Nose, MD

## 2018-09-11 NOTE — Assessment & Plan Note (Signed)
BMI 42 

## 2018-09-12 ENCOUNTER — Telehealth (HOSPITAL_COMMUNITY): Payer: Self-pay

## 2018-09-12 ENCOUNTER — Encounter (HOSPITAL_COMMUNITY): Payer: Self-pay

## 2018-09-12 NOTE — Telephone Encounter (Signed)
Attempted to contact pt in regards to CR and his insurance, unable to leave voicemail.  Mailed letter

## 2018-09-18 ENCOUNTER — Ambulatory Visit (INDEPENDENT_AMBULATORY_CARE_PROVIDER_SITE_OTHER): Payer: Self-pay | Admitting: Vascular Surgery

## 2018-09-18 ENCOUNTER — Encounter: Payer: Self-pay | Admitting: Vascular Surgery

## 2018-09-18 ENCOUNTER — Other Ambulatory Visit: Payer: Self-pay

## 2018-09-18 VITALS — BP 108/69 | HR 86 | Temp 97.2°F | Resp 20 | Ht 71.0 in | Wt 303.0 lb

## 2018-09-18 DIAGNOSIS — I743 Embolism and thrombosis of arteries of the lower extremities: Secondary | ICD-10-CM
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
## 2018-09-20 ENCOUNTER — Other Ambulatory Visit: Payer: Self-pay

## 2018-09-20 DIAGNOSIS — I743 Embolism and thrombosis of arteries of the lower extremities: Secondary | ICD-10-CM

## 2018-09-21 ENCOUNTER — Ambulatory Visit (INDEPENDENT_AMBULATORY_CARE_PROVIDER_SITE_OTHER): Payer: Self-pay | Admitting: *Deleted

## 2018-09-21 DIAGNOSIS — I240 Acute coronary thrombosis not resulting in myocardial infarction: Secondary | ICD-10-CM

## 2018-09-21 DIAGNOSIS — Z7901 Long term (current) use of anticoagulants: Secondary | ICD-10-CM

## 2018-09-21 DIAGNOSIS — I513 Intracardiac thrombosis, not elsewhere classified: Secondary | ICD-10-CM

## 2018-09-21 DIAGNOSIS — D6859 Other primary thrombophilia: Secondary | ICD-10-CM

## 2018-09-21 LAB — POCT INR: INR: 1.6 — AB (ref 2.0–3.0)

## 2018-09-21 NOTE — Patient Instructions (Signed)
Description   Today take 1 tablet, then start taking 1/2 tablet daily except 1 tablet on Sundays, Tuesdays and Thursdays. Recheck in one week. Call us with any medication changes #[phone] or concerns.

## 2018-09-25 ENCOUNTER — Other Ambulatory Visit: Payer: Self-pay | Admitting: Cardiothoracic Surgery

## 2018-09-25 DIAGNOSIS — Z951 Presence of aortocoronary bypass graft: Secondary | ICD-10-CM

## 2018-09-25 LAB — FUNGUS CULTURE WITH STAIN

## 2018-09-25 LAB — FUNGUS CULTURE RESULT

## 2018-09-25 LAB — FUNGAL ORGANISM REFLEX

## 2018-09-27 ENCOUNTER — Ambulatory Visit
Admission: RE | Admit: 2018-09-27 | Discharge: 2018-09-27 | Disposition: A | Discharge status: Home / Self Care | Payer: Self-pay | Source: Ambulatory Visit | Attending: Cardiothoracic Surgery | Admitting: Cardiothoracic Surgery

## 2018-09-27 ENCOUNTER — Ambulatory Visit (INDEPENDENT_AMBULATORY_CARE_PROVIDER_SITE_OTHER): Payer: Self-pay | Admitting: Cardiothoracic Surgery

## 2018-09-27 VITALS — BP 100/64 | HR 70 | Resp 20 | Ht 71.0 in | Wt 303.0 lb

## 2018-09-27 DIAGNOSIS — Z951 Presence of aortocoronary bypass graft: Secondary | ICD-10-CM

## 2018-09-27 NOTE — Addendum Note (Signed)
Addended by: Tommye Standard F on: 09/27/2018 02:05 PM   Modules accepted: Orders

## 2018-09-27 NOTE — Progress Notes (Signed)
[address]       [place] [address]             [phone]      [name]. [place] Medical Record #[id number] Date of Birth: 1971-01-19  Referring: Ernest Mallick, MD Primary Care: [facility] Primary Cardiologist: Chrystie Nose, MD   Chief Complaint:   POST OP FOLLOW UP DATE OF PROCEDURE:  08/24/2018  PREOPERATIVE DIAGNOSES:   1.  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 2.  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  POSTOPERATIVE DIAGNOSES:  1.  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 2.  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   SURGICAL PROCEDURE:   1.  Left ventricular ventriculotomy with removal of large mobile left ventricular clot 2.  Coronary artery bypass grafting x2 with the left internal mammary to the left anterior descending coronary artery and reverse saphenous vein graft to the diagonal coronary artery with right thigh endoscopic vein harvesting of the right greater  saphenous vein.  SURGEON:  Sheliah Plane, MD History of Present Illness:      Patient doing well since discharged.  I spent attention to his Coumadin which he continues to be diligent about taking.  He notes some neuropathy symptoms involving the right foot.  But is ambulating without difficulty.    Past Medical History:  Diagnosis Date  . Anemia age 24 or 7   none since  . Arthritis    oa  . Diabetes mellitus without complication (HCC)    newly diagnosed  . DVT (deep venous thrombosis) (HCC)   . GERD (gastroesophageal reflux disease)   . Head problem    back of top of head area swells at times, had glass in wound at  times, pt instrcuted to follow up with md, if any pus seen  . Headache(784.0)    migraine  . Morbid obesity (HCC)   . PE (pulmonary embolism) 10/01/2014  . Prediabetes on 04-28-14     Social History   Tobacco Use  Smoking Status Never Smoker  Smokeless Tobacco Never Used    Social History   Substance and Sexual Activity  Alcohol Use Yes  . Alcohol/week: 0.0 standard drinks   Comment: 2 x per month     Allergies  Allergen Reactions  . Contrast Media [Iodinated Diagnostic Agents] Itching    Pt states he thinks it makes him itch  . Lovenox [Enoxaparin Sodium] Itching    Itching over entire body  . Percocet [Oxycodone-Acetaminophen] Hives and Itching    Current Outpatient Medications  Medication Sig Dispense Refill  . aspirin EC 81 MG tablet Take 1 tablet (81 mg total) by mouth daily. 30 tablet 1  . atorvastatin (LIPITOR) 80 MG tablet Take 1 tablet (80 mg total) by mouth daily at 6 PM. 30 tablet 1  . insulin aspart (NOVOLOG) 100 UNIT/ML FlexPen Inject 6 Units into the skin 3 (three) times daily with meals. 15 mL 0  . Insulin Glargine (LANTUS) 100 UNIT/ML Solostar Pen Inject 35 Units into the skin daily. 15 mL 0  . Insulin Pen Needle 32G X 4 MM MISC 1 Device by Does not apply route 4 (four) times daily. For use with insulin  pens 100 each 0  . losartan (COZAAR) 25 MG tablet Take 1 tablet (25 mg total) by mouth at bedtime. 30 tablet 0  . metoprolol tartrate (LOPRESSOR) 25 MG tablet Take 1 tablet (25 mg total) by mouth 2 (two) times daily. 60 tablet 1  . traMADol (ULTRAM) 50 MG tablet Take 50 mg by mouth as needed.    . warfarin (COUMADIN) 10 MG tablet Take 1 tablet (10 mg total) by mouth daily. As directed 30 tablet 1   No current facility-administered medications for this visit.        Physical Exam: BP 100/64 (BP Location: Left Arm, Cuff Size: Large)   Pulse 70   Resp 20   Ht 5\' 11"  (1.803 m)   Wt (!) 303 lb (137.4 kg)   SpO2 95% Comment: RA  BMI 42.26 kg/m    General appearance: alert and cooperative Neurologic: intact Heart: regular rate and rhythm, S1, S2 normal, no murmur, click, rub or gallop Lungs: clear to auscultation bilaterally Abdomen: soft, non-tender; bowel sounds normal; no masses,  no organomegaly Extremities: extremities normal, atraumatic, no cyanosis or edema and Homans sign is negative, no sign of DVT Wound: Sternum is stable and well-healed Palpable DP and PT pulses bilaterally Diagnostic Studies & Laboratory data:     Recent Radiology Findings:   Dg Chest 2 View  Result Date: 09/27/2018 CLINICAL DATA:  Coronary artery disease post CABG in December 2019, some shortness of breath, history diabetes mellitus, ischemic cardiomyopathy, pulmonary embolism EXAM: CHEST - 2 VIEW COMPARISON:  08/28/2018 FINDINGS: Upper normal heart size post CABG. Mediastinal contours and pulmonary vascularity normal. Lungs clear. No pulmonary infiltrate, pleural effusion or pneumothorax. Improved lung volumes versus prior study. Bones unremarkable. IMPRESSION: Post CABG. No acute abnormalities. Electronically Signed   By: Ulyses Southward M.D.   On: 09/27/2018 11:42      Recent Lab Findings: Lab Results  Component Value Date   WBC 13.5 (H) 08/31/2018   HGB 8.7 (L) 08/31/2018   HCT 26.7 (L) 08/31/2018   PLT 545 (H) 08/31/2018   GLUCOSE 155 (H) 08/31/2018   ALT 21 08/28/2018   AST 41 08/28/2018   NA 134 (L) 08/31/2018   K 4.3 08/31/2018   CL 97 (L) 08/31/2018   CREATININE 1.01 08/31/2018   BUN 20 08/31/2018   CO2 27 08/31/2018   INR 1.6 (A) 09/21/2018   HGBA1C 9.6 (H) 08/23/2018      Assessment / Plan:      Patient stable after recent coronary artery bypass grafting and removal of left ventricular clot.-Patient has previous history of pulmonary emboli on 2 different occasions, and embolization of clot from the left ventricle to the right leg.  He continues on Coumadin  He is making good progress following his cardiac surgery we have  allowed him to return to driving and he will enroll in the cardiac rehab program in the near future  Plan see him back in 4 to 5 weeks      Delight Ovens MD      [address] [place] [address] Office [phone]   Beeper [phone]  09/27/2018 1:29 PM

## 2018-09-28 ENCOUNTER — Other Ambulatory Visit: Payer: Self-pay

## 2018-09-28 ENCOUNTER — Ambulatory Visit (INDEPENDENT_AMBULATORY_CARE_PROVIDER_SITE_OTHER): Payer: Self-pay | Admitting: *Deleted

## 2018-09-28 ENCOUNTER — Encounter (HOSPITAL_COMMUNITY): Payer: Self-pay

## 2018-09-28 ENCOUNTER — Emergency Department (HOSPITAL_COMMUNITY): Payer: Self-pay

## 2018-09-28 ENCOUNTER — Emergency Department (HOSPITAL_COMMUNITY)
Admission: EM | Admit: 2018-09-28 | Discharge: 2018-09-28 | Disposition: A | Discharge status: Home / Self Care | Payer: Self-pay | Attending: Emergency Medicine | Admitting: Emergency Medicine

## 2018-09-28 DIAGNOSIS — I513 Intracardiac thrombosis, not elsewhere classified: Secondary | ICD-10-CM

## 2018-09-28 DIAGNOSIS — E119 Type 2 diabetes mellitus without complications: Secondary | ICD-10-CM | POA: Insufficient documentation

## 2018-09-28 DIAGNOSIS — R079 Chest pain, unspecified: Secondary | ICD-10-CM | POA: Insufficient documentation

## 2018-09-28 DIAGNOSIS — Z7901 Long term (current) use of anticoagulants: Secondary | ICD-10-CM

## 2018-09-28 DIAGNOSIS — R0789 Other chest pain: Secondary | ICD-10-CM

## 2018-09-28 DIAGNOSIS — Z794 Long term (current) use of insulin: Secondary | ICD-10-CM | POA: Insufficient documentation

## 2018-09-28 DIAGNOSIS — I240 Acute coronary thrombosis not resulting in myocardial infarction: Secondary | ICD-10-CM

## 2018-09-28 DIAGNOSIS — Z7982 Long term (current) use of aspirin: Secondary | ICD-10-CM | POA: Insufficient documentation

## 2018-09-28 DIAGNOSIS — D6859 Other primary thrombophilia: Secondary | ICD-10-CM

## 2018-09-28 DIAGNOSIS — Z79899 Other long term (current) drug therapy: Secondary | ICD-10-CM | POA: Insufficient documentation

## 2018-09-28 LAB — CBC WITH DIFFERENTIAL/PLATELET
Abs Immature Granulocytes: 0.01 10*3/uL (ref 0.00–0.07)
Basophils Absolute: 0 10*3/uL (ref 0.0–0.1)
Basophils Relative: 0 %
Eosinophils Absolute: 0.2 10*3/uL (ref 0.0–0.5)
Eosinophils Relative: 3 %
HCT: 36.6 % — ABNORMAL LOW (ref 39.0–52.0)
HEMOGLOBIN: 11.5 g/dL — AB (ref 13.0–17.0)
Immature Granulocytes: 0 %
Lymphocytes Relative: 41 %
Lymphs Abs: 2.8 10*3/uL (ref 0.7–4.0)
MCH: 25.7 pg — ABNORMAL LOW (ref 26.0–34.0)
MCHC: 31.4 g/dL (ref 30.0–36.0)
MCV: 81.7 fL (ref 80.0–100.0)
Monocytes Absolute: 0.6 10*3/uL (ref 0.1–1.0)
Monocytes Relative: 9 %
NEUTROS ABS: 3.1 10*3/uL (ref 1.7–7.7)
Neutrophils Relative %: 47 %
Platelets: 428 10*3/uL — ABNORMAL HIGH (ref 150–400)
RBC: 4.48 MIL/uL (ref 4.22–5.81)
RDW: 14.2 % (ref 11.5–15.5)
WBC: 6.7 10*3/uL (ref 4.0–10.5)
nRBC: 0 % (ref 0.0–0.2)

## 2018-09-28 LAB — PROTIME-INR
INR: 1.88
Prothrombin Time: 21.4 seconds — ABNORMAL HIGH (ref 11.4–15.2)

## 2018-09-28 LAB — I-STAT TROPONIN, ED: Troponin i, poc: 0.03 ng/mL (ref 0.00–0.08)

## 2018-09-28 LAB — COMPREHENSIVE METABOLIC PANEL
ALT: 20 U/L (ref 0–44)
AST: 21 U/L (ref 15–41)
Albumin: 3.1 g/dL — ABNORMAL LOW (ref 3.5–5.0)
Alkaline Phosphatase: 68 U/L (ref 38–126)
Anion gap: 8 (ref 5–15)
BUN: 7 mg/dL (ref 6–20)
CO2: 27 mmol/L (ref 22–32)
Calcium: 8.6 mg/dL — ABNORMAL LOW (ref 8.9–10.3)
Chloride: 103 mmol/L (ref 98–111)
Creatinine, Ser: 0.83 mg/dL (ref 0.61–1.24)
GFR calc Af Amer: >60 mL/min (ref >60)
GFR calc non Af Amer: >60 mL/min (ref >60)
Glucose, Bld: 146 mg/dL — ABNORMAL HIGH (ref 70–99)
Potassium: 3.8 mmol/L (ref 3.5–5.1)
Sodium: 138 mmol/L (ref 135–145)
Total Bilirubin: 0.6 mg/dL (ref 0.3–1.2)
Total Protein: 7 g/dL (ref 6.5–8.1)

## 2018-09-28 LAB — POCT INR: INR: 2.2 (ref 2.0–3.0)
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
## 2018-09-28 NOTE — Discharge Instructions (Signed)
Please read and follow all provided instructions.  Your diagnoses today include:  1. Chest tightness     Tests performed today include:  An EKG of your heart  Cardiac enzymes - a blood test for heart muscle damage  Blood counts and electrolytes  Coumadin level is 1.88 here  Vital signs. See below for your results today.   Medications prescribed:   None  Take any prescribed medications only as directed.  Follow-up instructions: Please follow-up with your primary care provider as soon as you can for further evaluation of your symptoms.   Return instructions:  SEEK IMMEDIATE MEDICAL ATTENTION IF:  You have severe chest pain, especially if the pain is crushing or pressure-like and spreads to the arms, back, neck, or jaw, or if you have sweating, nausea (feeling sick to your stomach), or shortness of breath. THIS IS AN EMERGENCY. Don't wait to see if the pain will go away. Get medical help at once. Call 911 or 0 (operator). DO NOT drive yourself to the hospital.   Your chest pain gets worse and does not go away with rest.   You have an attack of chest pain lasting longer than usual, despite rest and treatment with the medications your caregiver has prescribed.   You wake from sleep with chest pain or shortness of breath.  You feel dizzy or faint.  You have chest pain not typical of your usual pain for which you originally saw your caregiver.   You have any other emergent concerns regarding your health.  Additional Information: Chest pain comes from many different causes. Your caregiver has diagnosed you as having chest pain that is not specific for one problem, but does not require admission.  You are at low risk for an acute heart condition or other serious illness.   Your vital signs today were: BP 112/78    Pulse 64    Temp 97.9 F (36.6 C) (Oral)    Resp (!) 22    Ht 5\' 11"  (1.803 m)    Wt (!) 137 kg    SpO2 99%    BMI 42.12 kg/m  If your blood pressure (BP) was  elevated above 135/85 this visit, please have this repeated by your doctor within one month. --------------

## 2018-09-28 NOTE — ED Notes (Signed)
Patient verbalizes understanding of discharge instructions. Opportunity for questioning and answers were provided. Armband removed by staff, pt discharged from ED. Wheeled out to lobby  

## 2018-09-28 NOTE — ED Triage Notes (Signed)
Pt c/o non radiating mid sternal cp and sob that began this morning when he was eating breakfast ; denies any chills or fevers; recent open heart surgery ; pt alert and oriented x4 ; no s/s of distress noted

## 2018-09-28 NOTE — Patient Instructions (Signed)
Description   Continue taking 1/2 tablet daily except 1 tablet on Sundays, Tuesdays and Thursdays. Recheck in 1 week. Call us with any medication changes #[phone] or concerns.

## 2018-10-02 ENCOUNTER — Ambulatory Visit (INDEPENDENT_AMBULATORY_CARE_PROVIDER_SITE_OTHER): Payer: Self-pay | Admitting: Cardiology

## 2018-10-02 ENCOUNTER — Encounter: Payer: Self-pay | Admitting: Cardiology

## 2018-10-02 VITALS — BP 138/86 | HR 78 | Ht 71.0 in | Wt 306.2 lb

## 2018-10-02 DIAGNOSIS — I10 Essential (primary) hypertension: Secondary | ICD-10-CM

## 2018-10-02 MED ORDER — HYDRALAZINE HCL 25 MG PO TABS: 12.5000 mg | ORAL_TABLET | Freq: Every day | ORAL | 3 refills | Status: DC

## 2018-10-02 NOTE — Progress Notes (Signed)
10/02/2018 Marcus FieldWilliam L DicksonSprings Jr.   03-11-1971  [id number]  [facility] Primary Cardiologist: Marcus Marcus GoldenHilty   HPI:  [name] is a pleasant 48 year old morbidly obese AA male a history of pulmonary embolism in 2016.  He was placed on Xarelto then but only took it for about 30 days.  In September 2019 he presented with a DVT and pulmonary embolism.  He was again put on Xarelto.  He was seen by a hematologist, [facility] Joseph at [facility] in September.  The patient did not follow-up.  He apparently came off his Xarelto as an outpatient because of cost.  He presented to the emergency room 08/17/2018 with chest pain and shortness of breath.  CT scan revealed an acute pulmonary embolism and a large left ventricular thrombus.  He was admitted and placed on heparin.  Vascular exam revealed extensive arterial occlusion in the right leg.  On 08/18/2018 he underwent right iliac femoral popliteal and tibial thrombectomy.  Catheterization done 08/21/2018 revealed occlusion of his LAD after the diagonal and an 80% diagonal lesion.  There was 30% proximal RCA, 30% circumflex and 80% OM.  He underwent CABG and left ventricular thrombectomy on 08/24/2018.  He received an LIMA to the LAD and a vein graft to the diagonal.  His ejection fraction preop was 35 to 40%.  He was discharged on Coumadin therapy and is followed in the Coumadin Clinic.  I saw him in follow up 09/11/2018.  He was doing OK, a little volume overloaded and I gave him a course of 5 days of Lasix.  He saw Marcus Joseph for vascular follow up 09/18/2018 and Marcus Joseph 09/27/2018.  The day after he say Marcus Joseph he had an episode of SOB associated with anxiety and sweating.  He was sent to the ED from the Coumadin Clinic.  Work up there was negative and he was discharged.  He has not had a recurrent episode.  He denies any associated tachycardia with that episode.      Current Outpatient Medications  Medication Sig  Dispense Refill  . aspirin EC 81 MG tablet Take 1 tablet (81 mg total) by mouth daily. 30 tablet 1  . atorvastatin (LIPITOR) 80 MG tablet Take 1 tablet (80 mg total) by mouth daily at 6 PM. 30 tablet 1  . insulin aspart (NOVOLOG) 100 UNIT/ML FlexPen Inject 6 Units into the skin 3 (three) times daily with meals. (Patient taking differently: Inject 2-4 Units into the skin 3 (three) times daily with meals. Using sliding scale for dosing) 15 mL 0  . Insulin Glargine (LANTUS) 100 UNIT/ML Solostar Pen Inject 35 Units into the skin daily. (Patient taking differently: Inject 30 Units into the skin daily. ) 15 mL 0  . Insulin Pen Needle 32G X 4 MM MISC 1 Device by Does not apply route 4 (four) times daily. For use with insulin pens 100 each 0  . losartan (COZAAR) 25 MG tablet Take 1 tablet (25 mg total) by mouth at bedtime. 30 tablet 0  . metoprolol tartrate (LOPRESSOR) 25 MG tablet Take 1 tablet (25 mg total) by mouth 2 (two) times daily. 60 tablet 1  . traMADol (ULTRAM) 50 MG tablet Take 50 mg by mouth every 8 (eight) hours as needed for moderate pain.     Marland Kitchen. warfarin (COUMADIN) 10 MG tablet Take 1 tablet (10 mg total) by mouth daily. As directed (Patient taking differently: Take 5-10 mg by mouth daily. As directed.  Taking 1 tablet (10mg ) on Sunday, Wed, Fri. All other days 1/2 tablet (5mg )) 30 tablet 1   No current facility-administered medications for this visit.     Allergies  Allergen Reactions  . Contrast Media [Iodinated Diagnostic Agents] Itching    Pt states he thinks it makes him itch  . Lovenox [Enoxaparin Sodium] Itching    Itching over entire body  . Percocet [Oxycodone-Acetaminophen] Hives and Itching    Past Medical History:  Diagnosis Date  . Anemia age 46 or 7   none since  . Arthritis    oa  . Diabetes mellitus without complication (HCC)    newly diagnosed  . DVT (deep venous thrombosis) (HCC)   . GERD (gastroesophageal reflux disease)   . Head problem    back of top of  head area swells at times, had glass in wound at times, pt instrcuted to follow up with md, if any pus seen  . Headache(784.0)    migraine  . Morbid obesity (HCC)   . PE (pulmonary embolism) 10/01/2014  . Prediabetes on 04-28-14    Social History   Socioeconomic History  . Marital status: Divorced    Spouse name: Not on file  . Number of children: Not on file  . Years of education: Not on file  . Highest education level: Not on file  Occupational History  . Not on file  Social Needs  . Financial resource strain: Not on file  . Food insecurity:    Worry: Not on file    Inability: Not on file  . Transportation needs:    Medical: Not on file    Non-medical: Not on file  Tobacco Use  . Smoking status: Never Smoker  . Smokeless tobacco: Never Used  Substance and Sexual Activity  . Alcohol use: Yes    Alcohol/week: 0.0 standard drinks    Comment: 2 x per month  . Drug use: No  . Sexual activity: Not on file  Lifestyle  . Physical activity:    Days per week: Not on file    Minutes per session: Not on file  . Stress: Not on file  Relationships  . Social connections:    Talks on phone: Not on file    Gets together: Not on file    Attends religious service: Not on file    Active member of club or organization: Not on file    Attends meetings of clubs or organizations: Not on file    Relationship status: Not on file  . Intimate partner violence:    Fear of current or ex partner: Not on file    Emotionally abused: Not on file    Physically abused: Not on file    Forced sexual activity: Not on file  Other Topics Concern  . Not on file  Social History Narrative  . Not on file     Family History  Problem Relation Age of Onset  . Hypertension Other   . Obesity Other   . Deep vein thrombosis Mother      Review of Systems: General: negative for chills, fever, night sweats or weight changes.  Cardiovascular: negative for chest pain, dyspnea on exertion, orthopnea,  palpitations Dermatological: negative for rash Respiratory: negative for cough or wheezing Urologic: negative for hematuria Abdominal: negative for nausea, vomiting, diarrhea, bright red blood per rectum, melena, or hematemesis Neurologic: negative for visual changes, syncope, or dizziness All other systems reviewed and are otherwise negative except as noted above.  Blood pressure 138/86, pulse 78, height 5\' 11"  (1.803 m), weight (!) 306 lb 4 oz (138.9 kg), SpO2 96 %.  General appearance: alert, cooperative, no distress and morbidly obese Lungs: clear to auscultation bilaterally Heart: regular rate and rhythm Extremities: 1+ bilateral LE edema Skin: Skin color, texture, turgor normal. No rashes or lesions Neurologic: Grossly normal   ASSESSMENT AND PLAN:   SOB- I wonder if this episode wasn't an anxiety attack. He denies any tachycardia. Its also possible he had CHF though this wouldn't have resolved without Rx.  He is a little volume overloaded on exam with LE edema.  His last BUN was 7.  Long term (current) use of anticoagulants Coumadin for multiple recurrent PE/DVT and LVT  S/P CABG x 2, Resection of LV Thrombus LIMA-LAD, SVG-Dx and LVT resection 08/24/18   Type 2 diabetes mellitus with hyperglycemia (HCC) On Insulin  Morbid obesity (HCC) BMI 42  Ischemic cardiomyopathy EF 35-40% pre op  Arterial embolism of right leg (HCC) S/P Rt iliac, femoral, popliteal, and tibial embolectomy 08/18/18- pre CABG/ LVT thrombectomy  PLAN  Add HCTZ 12.5 mg daily. BMP two weeks, f/u 3-4 weeks. If he has another episode he may need a monitor placed.   Corine Shelter PA-C 10/02/2018 4:13 PM

## 2018-10-02 NOTE — Patient Instructions (Addendum)
Medication Instructions:  START Hydrochlorothiazide 12.5mg  Tablet comes in 25mg  you will take half a tablet once a day  For your first dose take 1 whole tablet then the next day start half a tablet once a day   If you need a refill on your cardiac medications before your next appointment, please call your pharmacy.   Lab work: Your physician recommends that you return for lab work PZ:WCHE IN 2-3 WEEKS (10/16/2018-10/26/2018) If you have labs (blood work) drawn today and your tests are completely normal, you will receive your results only by: Marland Kitchen MyChart Message (if you have MyChart) OR . A paper copy in the mail If you have any lab test that is abnormal or we need to change your treatment, we will call you to review the results.  Testing/Procedures: NONE   Follow-Up: . At [facility], you and your health needs are our priority.  As part of our continuing mission to provide you with exceptional heart care, we have created designated Provider Care Teams.  These Care Teams include your primary Cardiologist (physician) and Advanced Practice Providers (APPs -  Physician Assistants and Nurse Practitioners) who all work together to provide you with the care you need, when you need it.  . Your physician recommends that you schedule a follow-up appointment in: 3-4 WEEKS WITH Jonne Ply, PA-C  Any Other Special Instructions Will Be Listed Below (If Applicable).

## 2018-10-03 NOTE — Telephone Encounter (Signed)
Pt called and stated he is still waiting on his Medicaid, but he will like to participate in our CR maintenance program. Will pass information to our CR maintenance coord.  Closed referral

## 2018-10-05 ENCOUNTER — Ambulatory Visit (INDEPENDENT_AMBULATORY_CARE_PROVIDER_SITE_OTHER): Payer: Self-pay | Admitting: Pharmacist Clinician (PhC)/ Clinical Pharmacy Specialist

## 2018-10-05 DIAGNOSIS — I513 Intracardiac thrombosis, not elsewhere classified: Secondary | ICD-10-CM

## 2018-10-05 DIAGNOSIS — Z7901 Long term (current) use of anticoagulants: Secondary | ICD-10-CM

## 2018-10-05 DIAGNOSIS — I240 Acute coronary thrombosis not resulting in myocardial infarction: Secondary | ICD-10-CM

## 2018-10-05 DIAGNOSIS — D6859 Other primary thrombophilia: Secondary | ICD-10-CM

## 2018-10-05 LAB — POCT INR: INR: 2.3 (ref 2.0–3.0)

## 2018-10-08 LAB — ACID FAST CULTURE WITH REFLEXED SENSITIVITIES (MYCOBACTERIA): Acid Fast Culture: NEGATIVE

## 2018-10-12 ENCOUNTER — Other Ambulatory Visit: Payer: Self-pay | Admitting: Physician Assistant

## 2018-10-15 ENCOUNTER — Ambulatory Visit (INDEPENDENT_AMBULATORY_CARE_PROVIDER_SITE_OTHER): Payer: Self-pay | Admitting: *Deleted

## 2018-10-15 DIAGNOSIS — I240 Acute coronary thrombosis not resulting in myocardial infarction: Secondary | ICD-10-CM

## 2018-10-15 DIAGNOSIS — Z7901 Long term (current) use of anticoagulants: Secondary | ICD-10-CM

## 2018-10-15 DIAGNOSIS — D6859 Other primary thrombophilia: Secondary | ICD-10-CM

## 2018-10-15 DIAGNOSIS — I513 Intracardiac thrombosis, not elsewhere classified: Secondary | ICD-10-CM

## 2018-10-15 LAB — POCT INR: INR: 1.7 — AB (ref 2.0–3.0)

## 2018-10-15 MED ORDER — WARFARIN SODIUM 10 MG PO TABS: 10.0000 mg | ORAL_TABLET | ORAL | 3 refills | Status: DC

## 2018-10-15 NOTE — Patient Instructions (Signed)
Description   Today take 1 tablet, then change your dose to 1 tablet daily except 1/2 tablet on Mondays, Wednesdays and Fridays. Recheck in 10 days.  Call us with any medication changes #[phone] or concerns.

## 2018-10-22 ENCOUNTER — Telehealth: Payer: Self-pay

## 2018-10-22 ENCOUNTER — Other Ambulatory Visit: Payer: Self-pay

## 2018-10-22 MED ORDER — HYDROCHLOROTHIAZIDE 12.5 MG PO TABS: 12.5000 mg | ORAL_TABLET | Freq: Every day | ORAL | 3 refills | Status: DC

## 2018-10-22 MED ORDER — HYDROCHLOROTHIAZIDE 25 MG PO TABS: 25.0000 mg | ORAL_TABLET | Freq: Every day | ORAL | 6 refills | Status: DC

## 2018-10-22 NOTE — Telephone Encounter (Signed)
Contacted patient to remind him to complete his labs in the office sometime this week. Advised patient to contact office with any questions or concerns.

## 2018-10-22 NOTE — Progress Notes (Signed)
Updated medication list

## 2018-10-25 ENCOUNTER — Ambulatory Visit: Payer: Self-pay | Admitting: Cardiology

## 2018-10-26 ENCOUNTER — Encounter: Payer: Self-pay | Admitting: *Deleted

## 2018-10-26 ENCOUNTER — Ambulatory Visit (INDEPENDENT_AMBULATORY_CARE_PROVIDER_SITE_OTHER): Payer: Self-pay | Admitting: Cardiology

## 2018-10-26 ENCOUNTER — Encounter: Payer: Self-pay | Admitting: Cardiology

## 2018-10-26 ENCOUNTER — Ambulatory Visit: Payer: Self-pay | Admitting: Cardiology

## 2018-10-26 ENCOUNTER — Ambulatory Visit (INDEPENDENT_AMBULATORY_CARE_PROVIDER_SITE_OTHER): Payer: Self-pay | Admitting: Pharmacist

## 2018-10-26 VITALS — BP 137/91 | HR 93 | Ht 71.0 in | Wt 307.0 lb

## 2018-10-26 DIAGNOSIS — Z951 Presence of aortocoronary bypass graft: Secondary | ICD-10-CM

## 2018-10-26 DIAGNOSIS — I240 Acute coronary thrombosis not resulting in myocardial infarction: Secondary | ICD-10-CM

## 2018-10-26 DIAGNOSIS — G4730 Sleep apnea, unspecified: Secondary | ICD-10-CM

## 2018-10-26 DIAGNOSIS — I513 Intracardiac thrombosis, not elsewhere classified: Secondary | ICD-10-CM

## 2018-10-26 DIAGNOSIS — I255 Ischemic cardiomyopathy: Secondary | ICD-10-CM

## 2018-10-26 DIAGNOSIS — D6859 Other primary thrombophilia: Secondary | ICD-10-CM

## 2018-10-26 DIAGNOSIS — Z7901 Long term (current) use of anticoagulants: Secondary | ICD-10-CM

## 2018-10-26 LAB — BASIC METABOLIC PANEL
BUN/Creatinine Ratio: 10 (ref 9–20)
BUN: 8 mg/dL (ref 6–24)
CO2: 26 mmol/L (ref 20–29)
Calcium: 8.6 mg/dL — ABNORMAL LOW (ref 8.7–10.2)
Chloride: 99 mmol/L (ref 96–106)
Creatinine, Ser: 0.82 mg/dL (ref 0.76–1.27)
GFR calc Af Amer: 123 mL/min/{1.73_m2} (ref >59)
GFR calc non Af Amer: 106 mL/min/{1.73_m2} (ref >59)
Glucose: 251 mg/dL — ABNORMAL HIGH (ref 65–99)
Potassium: 4 mmol/L (ref 3.5–5.2)
Sodium: 138 mmol/L (ref 134–144)

## 2018-10-26 LAB — POCT INR: INR: 3.2 — AB (ref 2.0–3.0)

## 2018-10-26 NOTE — Progress Notes (Signed)
10/26/2018 Marcus Joseph.   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  The patient has a history of pulmonary embolism in 2016.  He was placed on Xarelto but only took it for about 30 days.  In September 2019 he presented with a DVT and pulmonary embolism.  He was again put on Xarelto.  He was seen by hematologist, Dr. Neil Crouch at [facility] in September. He apparently came off his Xarelto as an outpatient because of cost.  He presented to the emergency room 08/17/2018 with chest pain and shortness of breath.  CT scan revealed an acute pulmonary embolism and a large left ventricular thrombus.  He was admitted and placed on heparin.  Vascular exam revealed extensive arterial occlusion in the right leg.  On 08/18/2018 he underwent right iliac femoral popliteal and tibial thrombectomy.  Catheterization done 08/21/2018 revealed occlusion of his LAD after the diagonal and an 80% diagonal lesion.  There was 30% proximal RCA, 30% circumflex and 80% OM.  He underwent bypass grafting and left ventricular thrombectomy on 08/24/2018.  He received an LIMA to the LAD and a vein graft to the diagonal.  His ejection fraction preop was 35 to 40%.  He was discharged on Coumadin.  He was seen in the office 09/11/2018 in follow-up.  His B/P has been borderline low.  He had an episode of SOB and I added HCTZ 12.5 mg daily.  He returns today for follow up.  He feels well, no unusual dyspnea.  He is tolerating his medications.      Current Outpatient Medications  Medication Sig Dispense Refill  . aspirin EC 81 MG tablet Take 1 tablet (81 mg total) by mouth daily. 30 tablet 1  . atorvastatin (LIPITOR) 80 MG tablet Take 1 tablet (80 mg total) by mouth daily at 6 PM. 30 tablet 1  . hydrochlorothiazide (HYDRODIURIL) 12.5 MG tablet Take 1 tablet (12.5 mg total) by mouth  daily. 30 tablet 3  . insulin aspart (NOVOLOG) 100 UNIT/ML FlexPen Inject 6 Units into the skin 3 (three) times daily with meals. (Patient taking differently: Inject 2-4 Units into the skin 3 (three) times daily with meals. Using sliding scale for dosing) 15 mL 0  . Insulin Glargine (LANTUS) 100 UNIT/ML Solostar Pen Inject 35 Units into the skin daily. (Patient taking differently: Inject 30 Units into the skin daily. ) 15 mL 0  . Insulin Pen Needle 32G X 4 MM MISC 1 Device by Does not apply route 4 (four) times daily. For use with insulin pens 100 each 0  . losartan (COZAAR) 25 MG tablet Take 1 tablet (25 mg total) by mouth at bedtime. 30 tablet 0  . metoprolol tartrate (LOPRESSOR) 25 MG tablet Take 1 tablet (25 mg total) by mouth 2 (two) times daily. 60 tablet 1  . traMADol (ULTRAM) 50 MG tablet Take 50 mg by mouth every 8 (eight) hours as needed for moderate pain.     Marland Kitchen warfarin (COUMADIN) 10 MG tablet Take 1 tablet (10 mg total) by mouth as directed. As directed 30 tablet 3   No current facility-administered medications for this visit.     Allergies  Allergen Reactions  . Contrast Media [Iodinated Diagnostic Agents] Itching    Pt states he thinks it makes him itch  . Lovenox [Enoxaparin Sodium] Itching    Itching over entire body  .  Percocet [Oxycodone-Acetaminophen] Hives and Itching    Past Medical History:  Diagnosis Date  . Anemia age 9 or 7   none since  . Arthritis    oa  . Diabetes mellitus without complication (HCC)    newly diagnosed  . DVT (deep venous thrombosis) (HCC)   . GERD (gastroesophageal reflux disease)   . Head problem    back of top of head area swells at times, had glass in wound at times, pt instrcuted to follow up with md, if any pus seen  . Headache(784.0)    migraine  . Morbid obesity (HCC)   . PE (pulmonary embolism) 10/01/2014  . Prediabetes on 04-28-14    Social History   Socioeconomic History  . Marital status: Divorced    Spouse name: Not  on file  . Number of children: Not on file  . Years of education: Not on file  . Highest education level: Not on file  Occupational History  . Not on file  Social Needs  . Financial resource strain: Not on file  . Food insecurity:    Worry: Not on file    Inability: Not on file  . Transportation needs:    Medical: Not on file    Non-medical: Not on file  Tobacco Use  . Smoking status: Never Smoker  . Smokeless tobacco: Never Used  Substance and Sexual Activity  . Alcohol use: Yes    Alcohol/week: 0.0 standard drinks    Comment: 2 x per month  . Drug use: No  . Sexual activity: Not on file  Lifestyle  . Physical activity:    Days per week: Not on file    Minutes per session: Not on file  . Stress: Not on file  Relationships  . Social connections:    Talks on phone: Not on file    Gets together: Not on file    Attends religious service: Not on file    Active member of club or organization: Not on file    Attends meetings of clubs or organizations: Not on file    Relationship status: Not on file  . Intimate partner violence:    Fear of current or ex partner: Not on file    Emotionally abused: Not on file    Physically abused: Not on file    Forced sexual activity: Not on file  Other Topics Concern  . Not on file  Social History Narrative  . Not on file     Family History  Problem Relation Age of Onset  . Hypertension Other   . Obesity Other   . Deep vein thrombosis Mother      Review of Systems: General: negative for chills, fever, night sweats or weight changes.  Cardiovascular: negative for chest pain, dyspnea on exertion, edema, orthopnea, palpitations, paroxysmal nocturnal dyspnea or shortness of breath Dermatological: negative for rash Respiratory: negative for cough or wheezing Urologic: negative for hematuria Abdominal: negative for nausea, vomiting, diarrhea, bright red blood per rectum, melena, or hematemesis Neurologic: negative for visual changes,  syncope, or dizziness All other systems reviewed and are otherwise negative except as noted above.    Blood pressure (!) 137/91, pulse 93, height  (1.803 m), weight (!) 307 lb (139.3 kg).  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 Skura is doing well.  I considered adding low-dose carvedilol but his blood pressure is running a little bit low.  We may want to consider this in the future.   He is applying for disability and Medicare.  Currently he is paying cash for his medications.  He would be a good candidate for an SGLT2, he may want to wait until he gets on Medicare for this.  I have asked him to follow-up with his primary care provider and ask her about this.  I have also asked him to contact his PCP about his setting up his split-night study.  He has an appointment with Dr. Tyrone Sage in March.  We will get an echo later in March and then he will see Dr. Rennis Golden in April.  Corine Shelter PA-C 10/26/2018 11:46 AM

## 2018-10-26 NOTE — Patient Instructions (Signed)
Medication Instructions:  Your physician recommends that you continue on your current medications as directed. Please refer to the Current Medication list given to you today. If you need a refill on your cardiac medications before your next appointment, please call your pharmacy.   Lab work: NONE  If you have labs (blood work) drawn today and your tests are completely normal, you will receive your results only by: Marland Kitchen MyChart Message (if you have MyChart) OR . A paper copy in the mail If you have any lab test that is abnormal or we need to change your treatment, we will call you to review the results.  Testing/Procedures: Your physician has requested that you have an echocardiogram. Echocardiography is a painless test that uses sound waves to create images of your heart. It provides your doctor with information about the size and shape of your heart and how well your heart's chambers and valves are working. This procedure takes approximately one hour. There are no restrictions for this procedure. [address]  Follow-Up: At [facility], you and your health needs are our priority.  As part of our continuing mission to provide you with exceptional heart care, we have created designated Provider Care Teams.  These Care Teams include your primary Cardiologist (physician) and Advanced Practice Providers (APPs -  Physician Assistants and Nurse Practitioners) who all work together to provide you with the care you need, when you need it. . FOLLOW UP WITH DR HILTY AS SCHEDULED  Any Other Special Instructions Will Be Listed Below (If Applicable). [facility] RECOMMENDS YOU FOLLOW UP WITH   1. YOUR SLEEP STUDY   2. YOUR HEMATOLOGIST  3. YOUR PCP

## 2018-10-26 NOTE — Assessment & Plan Note (Signed)
Work up in [employer]- not completed secondary to CABG

## 2018-10-26 NOTE — Assessment & Plan Note (Signed)
LIMA-LAD, SVG-Dx and LVT resection 08/24/18

## 2018-10-29 ENCOUNTER — Telehealth (HOSPITAL_COMMUNITY): Payer: Self-pay | Admitting: *Deleted

## 2018-10-31 ENCOUNTER — Emergency Department (HOSPITAL_BASED_OUTPATIENT_CLINIC_OR_DEPARTMENT_OTHER)
Admission: EM | Admit: 2018-10-31 | Discharge: 2018-10-31 | Disposition: A | Discharge status: Home / Self Care | Payer: Self-pay | Attending: Emergency Medicine | Admitting: Emergency Medicine

## 2018-10-31 ENCOUNTER — Other Ambulatory Visit (HOSPITAL_COMMUNITY): Payer: Self-pay

## 2018-10-31 ENCOUNTER — Other Ambulatory Visit: Payer: Self-pay

## 2018-10-31 ENCOUNTER — Encounter (HOSPITAL_BASED_OUTPATIENT_CLINIC_OR_DEPARTMENT_OTHER): Payer: Self-pay | Admitting: Emergency Medicine

## 2018-10-31 DIAGNOSIS — Z7901 Long term (current) use of anticoagulants: Secondary | ICD-10-CM | POA: Insufficient documentation

## 2018-10-31 DIAGNOSIS — Z96652 Presence of left artificial knee joint: Secondary | ICD-10-CM | POA: Insufficient documentation

## 2018-10-31 DIAGNOSIS — E119 Type 2 diabetes mellitus without complications: Secondary | ICD-10-CM | POA: Insufficient documentation

## 2018-10-31 DIAGNOSIS — J111 Influenza due to unidentified influenza virus with other respiratory manifestations: Secondary | ICD-10-CM | POA: Insufficient documentation

## 2018-10-31 DIAGNOSIS — Z951 Presence of aortocoronary bypass graft: Secondary | ICD-10-CM | POA: Insufficient documentation

## 2018-10-31 DIAGNOSIS — Z7982 Long term (current) use of aspirin: Secondary | ICD-10-CM | POA: Insufficient documentation

## 2018-10-31 DIAGNOSIS — Z794 Long term (current) use of insulin: Secondary | ICD-10-CM | POA: Insufficient documentation

## 2018-10-31 DIAGNOSIS — Z79899 Other long term (current) drug therapy: Secondary | ICD-10-CM | POA: Insufficient documentation

## 2018-10-31 MED ORDER — OSELTAMIVIR PHOSPHATE 75 MG PO CAPS: 75.0000 mg | ORAL_CAPSULE | Freq: Two times a day (BID) | ORAL | 0 refills | Status: DC

## 2018-10-31 MED ORDER — OSELTAMIVIR PHOSPHATE 75 MG PO CAPS
75.0000 mg | ORAL_CAPSULE | Freq: Once | ORAL | Status: AC
  Administered 2018-10-31: 75 mg via ORAL
  Filled 2018-10-31: qty 1

## 2018-10-31 MED FILL — OSELTAMIVIR PHOSPHATE 75 MG: 75 | 5 days supply | Qty: 10 | Fill #0

## 2018-10-31 NOTE — ED Triage Notes (Signed)
Pt having flu like symptoms since last night with chills and body aches.
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
## 2018-11-01 ENCOUNTER — Other Ambulatory Visit: Payer: Self-pay | Admitting: Physician Assistant

## 2018-11-01 ENCOUNTER — Ambulatory Visit: Payer: Self-pay | Admitting: Cardiothoracic Surgery

## 2018-11-01 ENCOUNTER — Other Ambulatory Visit: Payer: Self-pay | Admitting: Cardiology

## 2018-11-02 MED FILL — METOPROLOL TARTRATE 25 MG T: 25 | 30 days supply | Qty: 60 | Fill #0

## 2018-11-02 MED FILL — ATORVASTATIN 80 MG TABLET: 80 | 30 days supply | Qty: 30 | Fill #0

## 2018-11-02 MED FILL — LOSARTAN POTASSIUM 25 MG TA: 25 | 30 days supply | Qty: 30 | Fill #0

## 2018-11-07 ENCOUNTER — Telehealth (HOSPITAL_COMMUNITY): Payer: Self-pay | Admitting: *Deleted

## 2018-11-07 NOTE — Telephone Encounter (Signed)
Patient was unable to keep follow-up appt with surgeon because onset of flu last week. Pt had some concerns about beginning the cardiac rehab maintenance program because of clots he says he has in his leg and lung. Pt follow-up visit rescheduled with Dr. Tyrone Sage on 4/9 and Dr. Rennis Golden on 4/16. Pt will call back after those office visits to discuss readiness to start cardiac rehab maintenance program. Artist Pais, MS, ACSM CEP

## 2018-11-09 ENCOUNTER — Other Ambulatory Visit: Payer: Self-pay

## 2018-11-09 ENCOUNTER — Ambulatory Visit (INDEPENDENT_AMBULATORY_CARE_PROVIDER_SITE_OTHER): Payer: Self-pay | Admitting: *Deleted

## 2018-11-09 DIAGNOSIS — I240 Acute coronary thrombosis not resulting in myocardial infarction: Secondary | ICD-10-CM

## 2018-11-09 DIAGNOSIS — Z7901 Long term (current) use of anticoagulants: Secondary | ICD-10-CM

## 2018-11-09 DIAGNOSIS — D6859 Other primary thrombophilia: Secondary | ICD-10-CM

## 2018-11-09 DIAGNOSIS — I513 Intracardiac thrombosis, not elsewhere classified: Secondary | ICD-10-CM

## 2018-11-09 LAB — POCT INR: INR: 3 (ref 2.0–3.0)

## 2018-11-09 NOTE — Patient Instructions (Signed)
Description   Continue taking 1 tablet daily except 1/2 tablet on Mondays, Wednesdays and Fridays. Recheck in 2 weeks.  Call us with any medication changes #[phone] or concerns.

## 2018-11-13 ENCOUNTER — Other Ambulatory Visit: Payer: Self-pay

## 2018-11-13 ENCOUNTER — Ambulatory Visit (HOSPITAL_COMMUNITY): Payer: Self-pay | Attending: Cardiovascular Disease

## 2018-11-13 DIAGNOSIS — I255 Ischemic cardiomyopathy: Secondary | ICD-10-CM | POA: Insufficient documentation

## 2018-11-13 LAB — ECHOCARDIOGRAM COMPLETE

## 2018-11-13 MED ORDER — PERFLUTREN LIPID MICROSPHERE
1.0000 mL | INTRAVENOUS | Status: AC | PRN
  Administered 2018-11-13: 3 mL via INTRAVENOUS

## 2018-11-15 DIAGNOSIS — Z0271 Encounter for disability determination: Secondary | ICD-10-CM

## 2018-11-21 ENCOUNTER — Telehealth: Payer: Self-pay

## 2018-11-21 NOTE — Telephone Encounter (Signed)
LMOM FOR SCREEN AND DRIVE THRU 

## 2018-11-22 ENCOUNTER — Telehealth: Payer: Self-pay | Admitting: Pharmacist

## 2018-11-22 NOTE — Telephone Encounter (Signed)
LMOM FOR COVID SCREEN DRIVE THRU AND RESCHEDULE

## 2018-11-22 NOTE — Telephone Encounter (Signed)

## 2018-11-23 ENCOUNTER — Ambulatory Visit (INDEPENDENT_AMBULATORY_CARE_PROVIDER_SITE_OTHER): Payer: Self-pay | Admitting: *Deleted

## 2018-11-23 DIAGNOSIS — I240 Acute coronary thrombosis not resulting in myocardial infarction: Secondary | ICD-10-CM

## 2018-11-23 DIAGNOSIS — D6859 Other primary thrombophilia: Secondary | ICD-10-CM

## 2018-11-23 DIAGNOSIS — Z7901 Long term (current) use of anticoagulants: Secondary | ICD-10-CM

## 2018-11-23 DIAGNOSIS — I513 Intracardiac thrombosis, not elsewhere classified: Secondary | ICD-10-CM

## 2018-11-23 LAB — POCT INR: INR: 3.8 — AB (ref 2.0–3.0)

## 2018-11-23 NOTE — Patient Instructions (Signed)
Description   Do not take any Coumadin then continue taking 1 tablet daily except 1/2 tablet on Mondays, Wednesdays and Fridays. Recheck in 2 weeks.  Call us with any medication changes #[phone] or concerns.

## 2018-11-27 DIAGNOSIS — Z0279 Encounter for issue of other medical certificate: Secondary | ICD-10-CM

## 2018-11-29 MED FILL — METOPROLOL TARTRATE 25 MG T: 25 | 30 days supply | Qty: 60 | Fill #1

## 2018-11-29 MED FILL — LOSARTAN POTASSIUM 25 MG TA: 25 | 30 days supply | Qty: 30 | Fill #1

## 2018-11-29 MED FILL — ATORVASTATIN 80 MG TABLET: 80 | 30 days supply | Qty: 30 | Fill #1

## 2018-11-29 MED FILL — HYDROCHLOROTHIAZIDE 12.5 MG: 12.5 | 30 days supply | Qty: 30 | Fill #0

## 2018-12-04 ENCOUNTER — Telehealth: Payer: Self-pay | Admitting: Cardiothoracic Surgery
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
## 2018-12-06 ENCOUNTER — Telehealth: Payer: Self-pay

## 2018-12-06 ENCOUNTER — Ambulatory Visit: Payer: Self-pay | Admitting: Cardiothoracic Surgery

## 2018-12-06 NOTE — Telephone Encounter (Signed)

## 2018-12-06 NOTE — Telephone Encounter (Signed)
lmom for prescreen/drive tru

## 2018-12-06 NOTE — Telephone Encounter (Signed)
Contacted patient to reschedule appointment if agreed.  SPOKE WITH PATIENT HE STATED THAT HE WAS INDEED FELLING WELL AND THAT I COULD RESCHEDULE HIS APPOINTMENT WHICH I DID FOR July 30TH AT 1:30 PM     Cardiac Questionnaire:    Since your last visit or hospitalization:    1. Have you been having new or worsening chest pain? NO   2. Have you been having new or worsening shortness of breath? NO 3. Have you been having new or worsening leg swelling, wt gain, or increase in abdominal girth (pants fitting more tightly)? NO   4. Have you had any passing out spells? NO

## 2018-12-07 ENCOUNTER — Ambulatory Visit (INDEPENDENT_AMBULATORY_CARE_PROVIDER_SITE_OTHER): Payer: Self-pay | Admitting: *Deleted

## 2018-12-07 ENCOUNTER — Other Ambulatory Visit: Payer: Self-pay

## 2018-12-07 DIAGNOSIS — D6859 Other primary thrombophilia: Secondary | ICD-10-CM

## 2018-12-07 DIAGNOSIS — I513 Intracardiac thrombosis, not elsewhere classified: Secondary | ICD-10-CM

## 2018-12-07 DIAGNOSIS — Z7901 Long term (current) use of anticoagulants: Secondary | ICD-10-CM

## 2018-12-07 DIAGNOSIS — I240 Acute coronary thrombosis not resulting in myocardial infarction: Secondary | ICD-10-CM

## 2018-12-07 LAB — POCT INR: INR: 4.2 — AB (ref 2.0–3.0)

## 2018-12-13 ENCOUNTER — Ambulatory Visit: Payer: Self-pay | Admitting: Internal Medicine

## 2018-12-18 ENCOUNTER — Encounter (HOSPITAL_COMMUNITY): Payer: Self-pay

## 2018-12-18 ENCOUNTER — Ambulatory Visit: Payer: Self-pay | Admitting: Vascular Surgery

## 2018-12-19 ENCOUNTER — Telehealth: Payer: Self-pay

## 2018-12-19 NOTE — Telephone Encounter (Signed)
1. Do you currently have a fever?NO(yes = cancel and refer to pcp for e-visit) 2. Have you recently travelled on a cruise, internationally, or to [employer], [place], [place], [place], [place], or [place], [place] [employer]) ? NO (yes = cancel, stay home, monitor symptoms, and contact pcp or initiate e-visit if symptoms develop) 3. Have you been in contact with someone that is currently pending confirmation of Covid19 testing or has been confirmed to have the Covid19 virus?  NO (yes = cancel, stay home, away from tested individual, monitor symptoms, and contact pcp or initiate e-visit if symptoms develop) 4. Are you currently experiencing fatigue or cough? NO (yes = pt should be prepared to have a mask placed at the time of their visit).  Pt. Advised that we are restricting visitors at this time and anyone present in the vehicle should meet the above criteria as well. Advised that visit will be at curbside for finger stick ONLY and will receive call with instructions. Pt also advised to please bring own pen for signature of arrival document. Marland Kitchen3

## 2018-12-21 ENCOUNTER — Other Ambulatory Visit: Payer: Self-pay

## 2018-12-21 ENCOUNTER — Ambulatory Visit (INDEPENDENT_AMBULATORY_CARE_PROVIDER_SITE_OTHER): Payer: PRIVATE HEALTH INSURANCE

## 2018-12-21 DIAGNOSIS — D6859 Other primary thrombophilia: Secondary | ICD-10-CM

## 2018-12-21 DIAGNOSIS — Z7901 Long term (current) use of anticoagulants: Secondary | ICD-10-CM

## 2018-12-21 DIAGNOSIS — I240 Acute coronary thrombosis not resulting in myocardial infarction: Secondary | ICD-10-CM

## 2018-12-21 DIAGNOSIS — I513 Intracardiac thrombosis, not elsewhere classified: Secondary | ICD-10-CM

## 2018-12-21 LAB — POCT INR: INR: 2.9 (ref 2.0–3.0)

## 2018-12-21 NOTE — Patient Instructions (Signed)
Description   Called spoke with pt, advised to continue on same dosage 1/2 pill everyday except 1 pill on Sundays, Tuesdays, and Thursdays . Recheck in 3 weeks.  Call us with any medication changes #[phone] or concerns.

## 2019-01-01 DIAGNOSIS — Z0279 Encounter for issue of other medical certificate: Secondary | ICD-10-CM | POA: Diagnosis not present

## 2019-01-02 MED FILL — WARFARIN NA 10 MG TAB: 10 | 30 days supply | Qty: 30 | Fill #0

## 2019-01-02 MED FILL — ATORVASTATIN 80 MG TABLET: 80 | 30 days supply | Qty: 30 | Fill #2

## 2019-01-02 MED FILL — LOSARTAN POTASSIUM 25 MG TA: 25 | 30 days supply | Qty: 30 | Fill #2

## 2019-01-02 MED FILL — hydrALAZINE HCL 25 MG TABS: 25 | 30 days supply | Qty: 15 | Fill #0

## 2019-01-02 MED FILL — METOPROLOL TARTRATE 25 MG T: 25 | 30 days supply | Qty: 60 | Fill #2

## 2019-01-10 ENCOUNTER — Telehealth: Payer: Self-pay

## 2019-01-10 NOTE — Telephone Encounter (Signed)
lmom for prescreen  

## 2019-01-11 ENCOUNTER — Other Ambulatory Visit: Payer: Self-pay

## 2019-01-11 ENCOUNTER — Ambulatory Visit (INDEPENDENT_AMBULATORY_CARE_PROVIDER_SITE_OTHER): Payer: Self-pay | Admitting: *Deleted

## 2019-01-11 DIAGNOSIS — I240 Acute coronary thrombosis not resulting in myocardial infarction: Secondary | ICD-10-CM

## 2019-01-11 DIAGNOSIS — D6859 Other primary thrombophilia: Secondary | ICD-10-CM

## 2019-01-11 DIAGNOSIS — I513 Intracardiac thrombosis, not elsewhere classified: Secondary | ICD-10-CM

## 2019-01-11 DIAGNOSIS — Z7901 Long term (current) use of anticoagulants: Secondary | ICD-10-CM

## 2019-01-11 LAB — POCT INR: INR: 2 (ref 2.0–3.0)

## 2019-01-11 NOTE — Patient Instructions (Signed)
Description   Spoke with pt and advised to take 1 pill today then continue on same dosage 1/2 pill everyday except 1 pill on Sundays, Tuesdays, and Thursdays. Recheck in 4 weeks.  Call us with any medication changes #[phone] or concerns.

## 2019-01-29 DIAGNOSIS — Z736 Limitation of activities due to disability: Secondary | ICD-10-CM

## 2019-01-31 ENCOUNTER — Telehealth: Payer: Self-pay

## 2019-01-31 NOTE — Telephone Encounter (Signed)
lmom for prescreen  

## 2019-01-31 NOTE — Telephone Encounter (Signed)
1. COVID-19 Pre-Screening Questions:  . In the past 7 to 10 days have you had a cough,  shortness of breath, headache, congestion, fever (100 or greater) body aches, chills, sore throat, or sudden loss of taste or sense of smell?  No . Have you been around anyone with known Covid 19.  No . Have you been around anyone who is awaiting Covid 19 test results in the past 7 to 10 days?  No . Have you been around anyone who has been exposed to Covid 19, or has mentioned symptoms of Covid 19 within the past 7 to 10 days?  No    2. Pt advised of visitor restrictions (no visitors allowed except if needed to conduct the visit). Also advised to arrive at appointment time and wear a mask.   Pt is a [facility] pt, moved appt to [facility].

## 2019-02-04 MED FILL — LOSARTAN POTASSIUM 25 MG TA: 25 | 30 days supply | Qty: 30 | Fill #3

## 2019-02-04 MED FILL — METOPROLOL TARTRATE 25 MG T: 25 | 30 days supply | Qty: 60 | Fill #3

## 2019-02-04 MED FILL — ATORVASTATIN 80 MG TABLET: 80 | 30 days supply | Qty: 30 | Fill #3

## 2019-02-04 MED FILL — hydrALAZINE HCL 25 MG TABS: 25 | 30 days supply | Qty: 15 | Fill #1

## 2019-02-04 MED FILL — HYDROCHLOROTHIAZIDE 12.5 MG: 12.5 | 30 days supply | Qty: 30 | Fill #1

## 2019-02-08 ENCOUNTER — Other Ambulatory Visit: Payer: Self-pay

## 2019-02-08 ENCOUNTER — Ambulatory Visit (INDEPENDENT_AMBULATORY_CARE_PROVIDER_SITE_OTHER): Payer: Self-pay | Admitting: *Deleted

## 2019-02-08 DIAGNOSIS — Z7901 Long term (current) use of anticoagulants: Secondary | ICD-10-CM

## 2019-02-08 DIAGNOSIS — I240 Acute coronary thrombosis not resulting in myocardial infarction: Secondary | ICD-10-CM

## 2019-02-08 DIAGNOSIS — I513 Intracardiac thrombosis, not elsewhere classified: Secondary | ICD-10-CM

## 2019-02-08 DIAGNOSIS — D6859 Other primary thrombophilia: Secondary | ICD-10-CM

## 2019-02-08 LAB — POCT INR: INR: 1.9 — AB (ref 2.0–3.0)

## 2019-02-08 NOTE — Patient Instructions (Signed)
Description   Start taking 1 tablet daily except 1/2 tablet on Mondays, Wednesdays and Fridays. Recheck in 2-3 weeks.  Call us with any medication changes [phone] or concerns.

## 2019-02-19 ENCOUNTER — Telehealth: Payer: Self-pay

## 2019-02-19 NOTE — Telephone Encounter (Signed)
Unable to lmom for prescreen no availability on the voicemail

## 2019-02-26 ENCOUNTER — Ambulatory Visit (INDEPENDENT_AMBULATORY_CARE_PROVIDER_SITE_OTHER): Payer: Self-pay | Admitting: Pharmacist

## 2019-02-26 ENCOUNTER — Other Ambulatory Visit: Payer: Self-pay

## 2019-02-26 DIAGNOSIS — I240 Acute coronary thrombosis not resulting in myocardial infarction: Secondary | ICD-10-CM

## 2019-02-26 DIAGNOSIS — I513 Intracardiac thrombosis, not elsewhere classified: Secondary | ICD-10-CM

## 2019-02-26 DIAGNOSIS — Z7901 Long term (current) use of anticoagulants: Secondary | ICD-10-CM

## 2019-02-26 DIAGNOSIS — D6859 Other primary thrombophilia: Secondary | ICD-10-CM

## 2019-02-26 LAB — POCT INR: INR: 2.4 (ref 2.0–3.0)

## 2019-03-07 MED FILL — WARFARIN NA 10 MG TAB: 10 | 30 days supply | Qty: 30 | Fill #1

## 2019-03-07 MED FILL — LOSARTAN POTASSIUM 25 MG TA: 25 | 30 days supply | Qty: 30 | Fill #0

## 2019-03-07 MED FILL — ATORVASTATIN 80 MG TABLET: 80 | 30 days supply | Qty: 30 | Fill #0

## 2019-03-07 MED FILL — METOPROLOL TARTRATE 25 MG T: 25 | 30 days supply | Qty: 60 | Fill #0

## 2019-03-28 ENCOUNTER — Encounter: Payer: Self-pay | Admitting: Internal Medicine

## 2019-03-28 ENCOUNTER — Ambulatory Visit (INDEPENDENT_AMBULATORY_CARE_PROVIDER_SITE_OTHER): Payer: Self-pay | Admitting: Internal Medicine

## 2019-03-28 ENCOUNTER — Other Ambulatory Visit: Payer: Self-pay

## 2019-03-28 VITALS — BP 110/76 | HR 52 | Temp 97.7°F | Ht 72.0 in | Wt 312.0 lb

## 2019-03-28 DIAGNOSIS — I2510 Atherosclerotic heart disease of native coronary artery without angina pectoris: Secondary | ICD-10-CM

## 2019-03-28 DIAGNOSIS — I255 Ischemic cardiomyopathy: Secondary | ICD-10-CM

## 2019-03-28 DIAGNOSIS — D6859 Other primary thrombophilia: Secondary | ICD-10-CM

## 2019-03-28 DIAGNOSIS — G4730 Sleep apnea, unspecified: Secondary | ICD-10-CM

## 2019-03-28 DIAGNOSIS — Z951 Presence of aortocoronary bypass graft: Secondary | ICD-10-CM

## 2019-03-28 DIAGNOSIS — I10 Essential (primary) hypertension: Secondary | ICD-10-CM
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
## 2019-03-28 NOTE — Patient Instructions (Signed)
Medication Instructions:  Your physician recommends that you continue on your current medications as directed. Please refer to the Current Medication list given to you today.  If you need a refill on your cardiac medications before your next appointment, please call your pharmacy.   Follow-Up: At [facility], you and your health needs are our priority.  As part of our continuing mission to provide you with exceptional heart care, we have created designated Provider Care Teams.  These Care Teams include your primary Cardiologist (physician) and Advanced Practice Providers (APPs -  Physician Assistants and Nurse Practitioners) who all work together to provide you with the care you need, when you need it. You will need a follow up appointment in 6 months.  Please call our office 2 months in advance to schedule this appointment.  You may see Kenneth C Hilty, MD or one of the following Advanced Practice Providers on your designated Care Team: Hao Meng, PA-C . Angela Duke, PA-C  Any Other Special Instructions Will Be Listed Below (If Applicable).    

## 2019-04-02 ENCOUNTER — Other Ambulatory Visit: Payer: Self-pay

## 2019-04-02 ENCOUNTER — Ambulatory Visit (INDEPENDENT_AMBULATORY_CARE_PROVIDER_SITE_OTHER): Payer: Self-pay | Admitting: Pharmacist Clinician (PhC)/ Clinical Pharmacy Specialist

## 2019-04-02 DIAGNOSIS — I240 Acute coronary thrombosis not resulting in myocardial infarction: Secondary | ICD-10-CM

## 2019-04-02 DIAGNOSIS — D6859 Other primary thrombophilia: Secondary | ICD-10-CM

## 2019-04-02 DIAGNOSIS — Z7901 Long term (current) use of anticoagulants: Secondary | ICD-10-CM

## 2019-04-02 DIAGNOSIS — I513 Intracardiac thrombosis, not elsewhere classified: Secondary | ICD-10-CM

## 2019-04-02 LAB — POCT INR: INR: 3.1 — AB (ref 2.0–3.0)

## 2019-04-09 ENCOUNTER — Encounter: Payer: Self-pay | Admitting: Internal Medicine

## 2019-04-09 MED FILL — ATORVASTATIN 80 MG TABLET: 80 | 30 days supply | Qty: 30 | Fill #1

## 2019-04-09 MED FILL — LOSARTAN POTASSIUM 25 MG TA: 25 | 30 days supply | Qty: 30 | Fill #1

## 2019-04-22 MED FILL — METOPROLOL TARTRATE 25 MG T: 25 | 30 days supply | Qty: 60 | Fill #1

## 2019-04-22 MED FILL — WARFARIN NA 10 MG TAB: 10 | 30 days supply | Qty: 30 | Fill #2

## 2019-05-13 MED FILL — LOSARTAN POTASSIUM 25 MG TA: 25 | 30 days supply | Qty: 30 | Fill #2

## 2019-05-13 MED FILL — ATORVASTATIN 80 MG TABLET: 80 | 30 days supply | Qty: 30 | Fill #2

## 2019-05-15 ENCOUNTER — Ambulatory Visit (INDEPENDENT_AMBULATORY_CARE_PROVIDER_SITE_OTHER): Payer: Self-pay | Admitting: Pharmacist

## 2019-05-15 ENCOUNTER — Other Ambulatory Visit: Payer: Self-pay

## 2019-05-15 DIAGNOSIS — I240 Acute coronary thrombosis not resulting in myocardial infarction: Secondary | ICD-10-CM

## 2019-05-15 DIAGNOSIS — I513 Intracardiac thrombosis, not elsewhere classified: Secondary | ICD-10-CM

## 2019-05-15 DIAGNOSIS — Z7901 Long term (current) use of anticoagulants: Secondary | ICD-10-CM

## 2019-05-15 DIAGNOSIS — D6859 Other primary thrombophilia: Secondary | ICD-10-CM

## 2019-05-15 LAB — POCT INR: INR: 3.1 — AB (ref 2.0–3.0)

## 2019-06-05 IMAGING — DX DG CHEST 2V
2 series · 2 of 2 positions shown · non-contrast
Comparison: 08/28/2018

CLINICAL DATA: Coronary artery disease post CABG in July 2018,
some shortness of breath, history diabetes mellitus, ischemic
cardiomyopathy, pulmonary embolism

EXAM:
CHEST - 2 VIEW

[dg chest 2 view (1 of 2)]
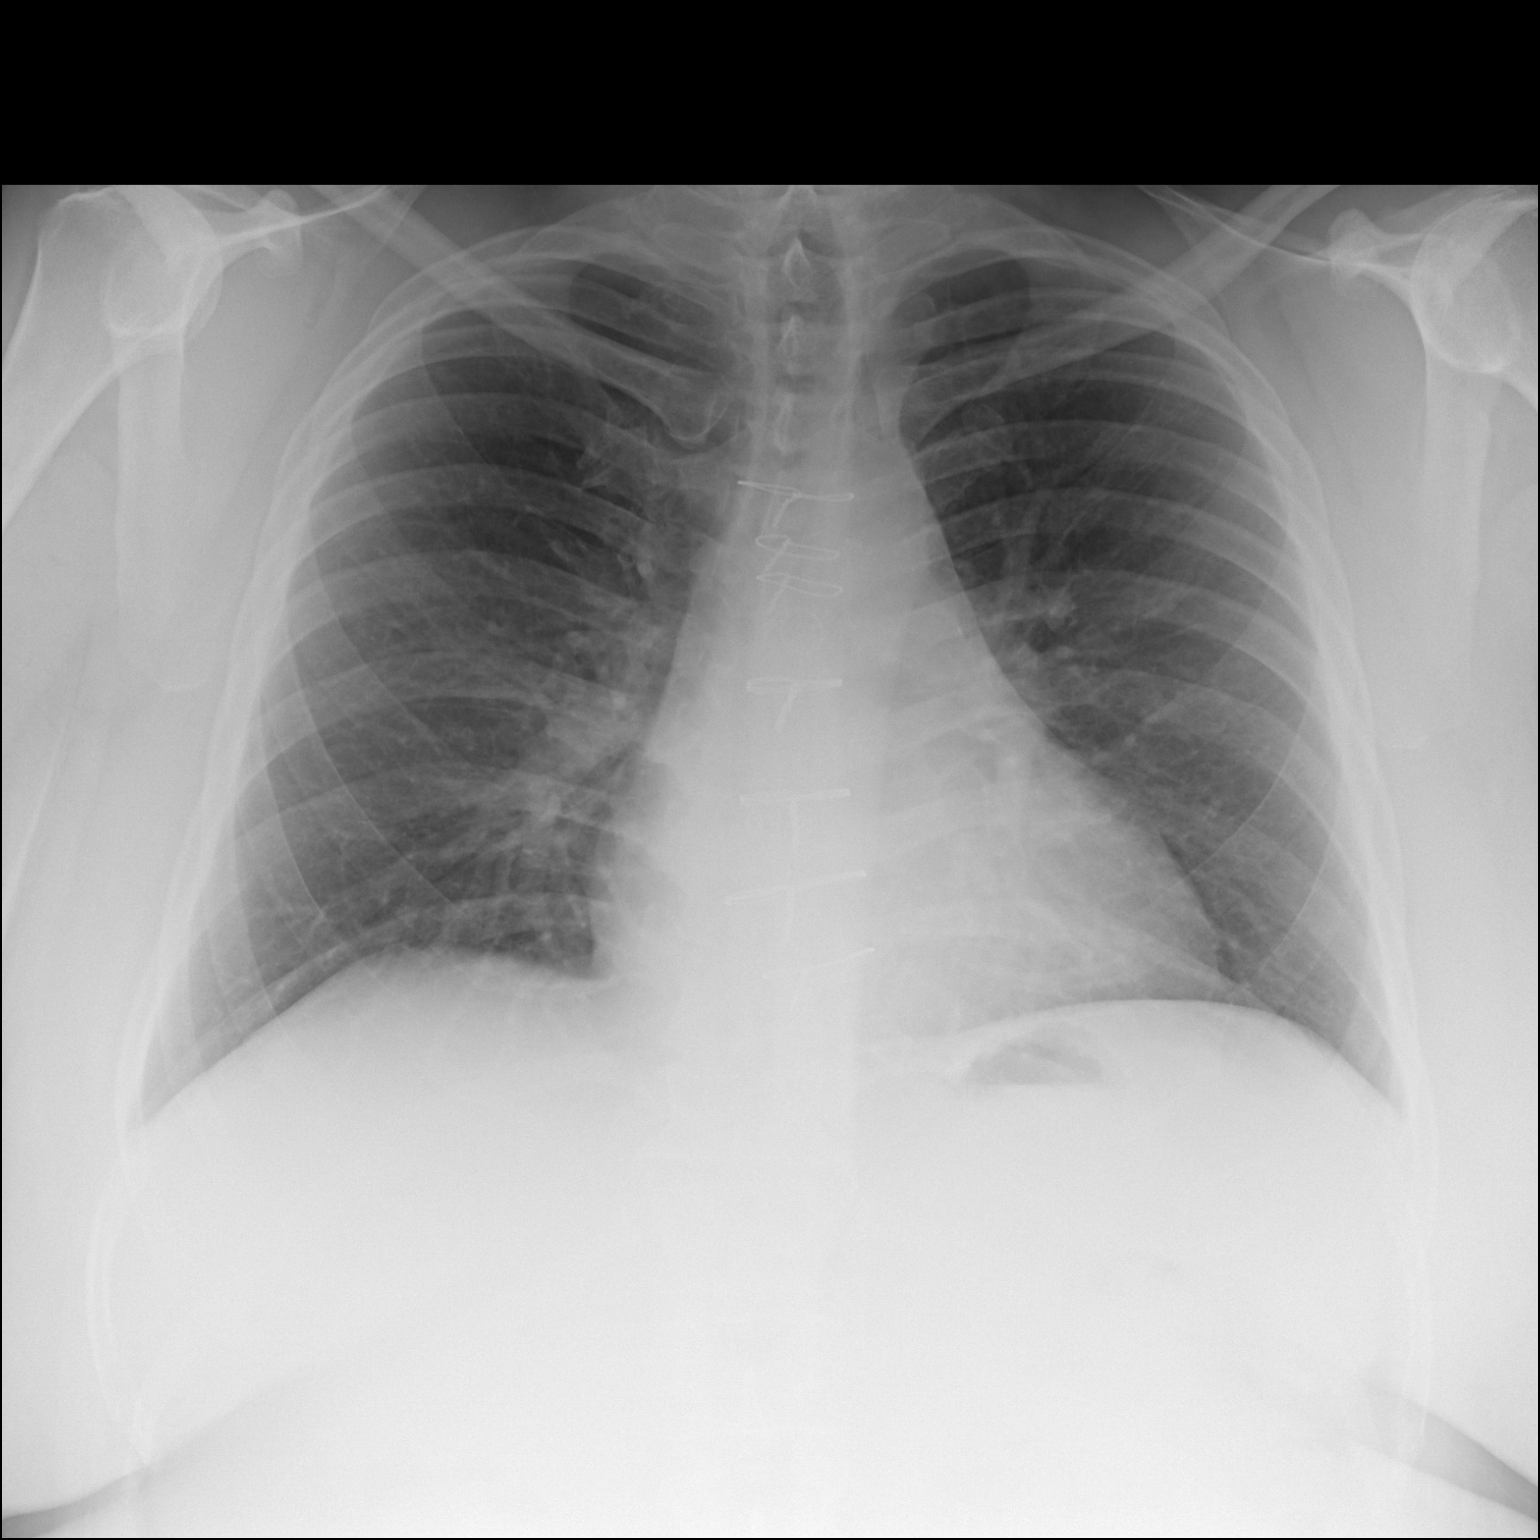

[dg chest 2 view (2 of 2)]
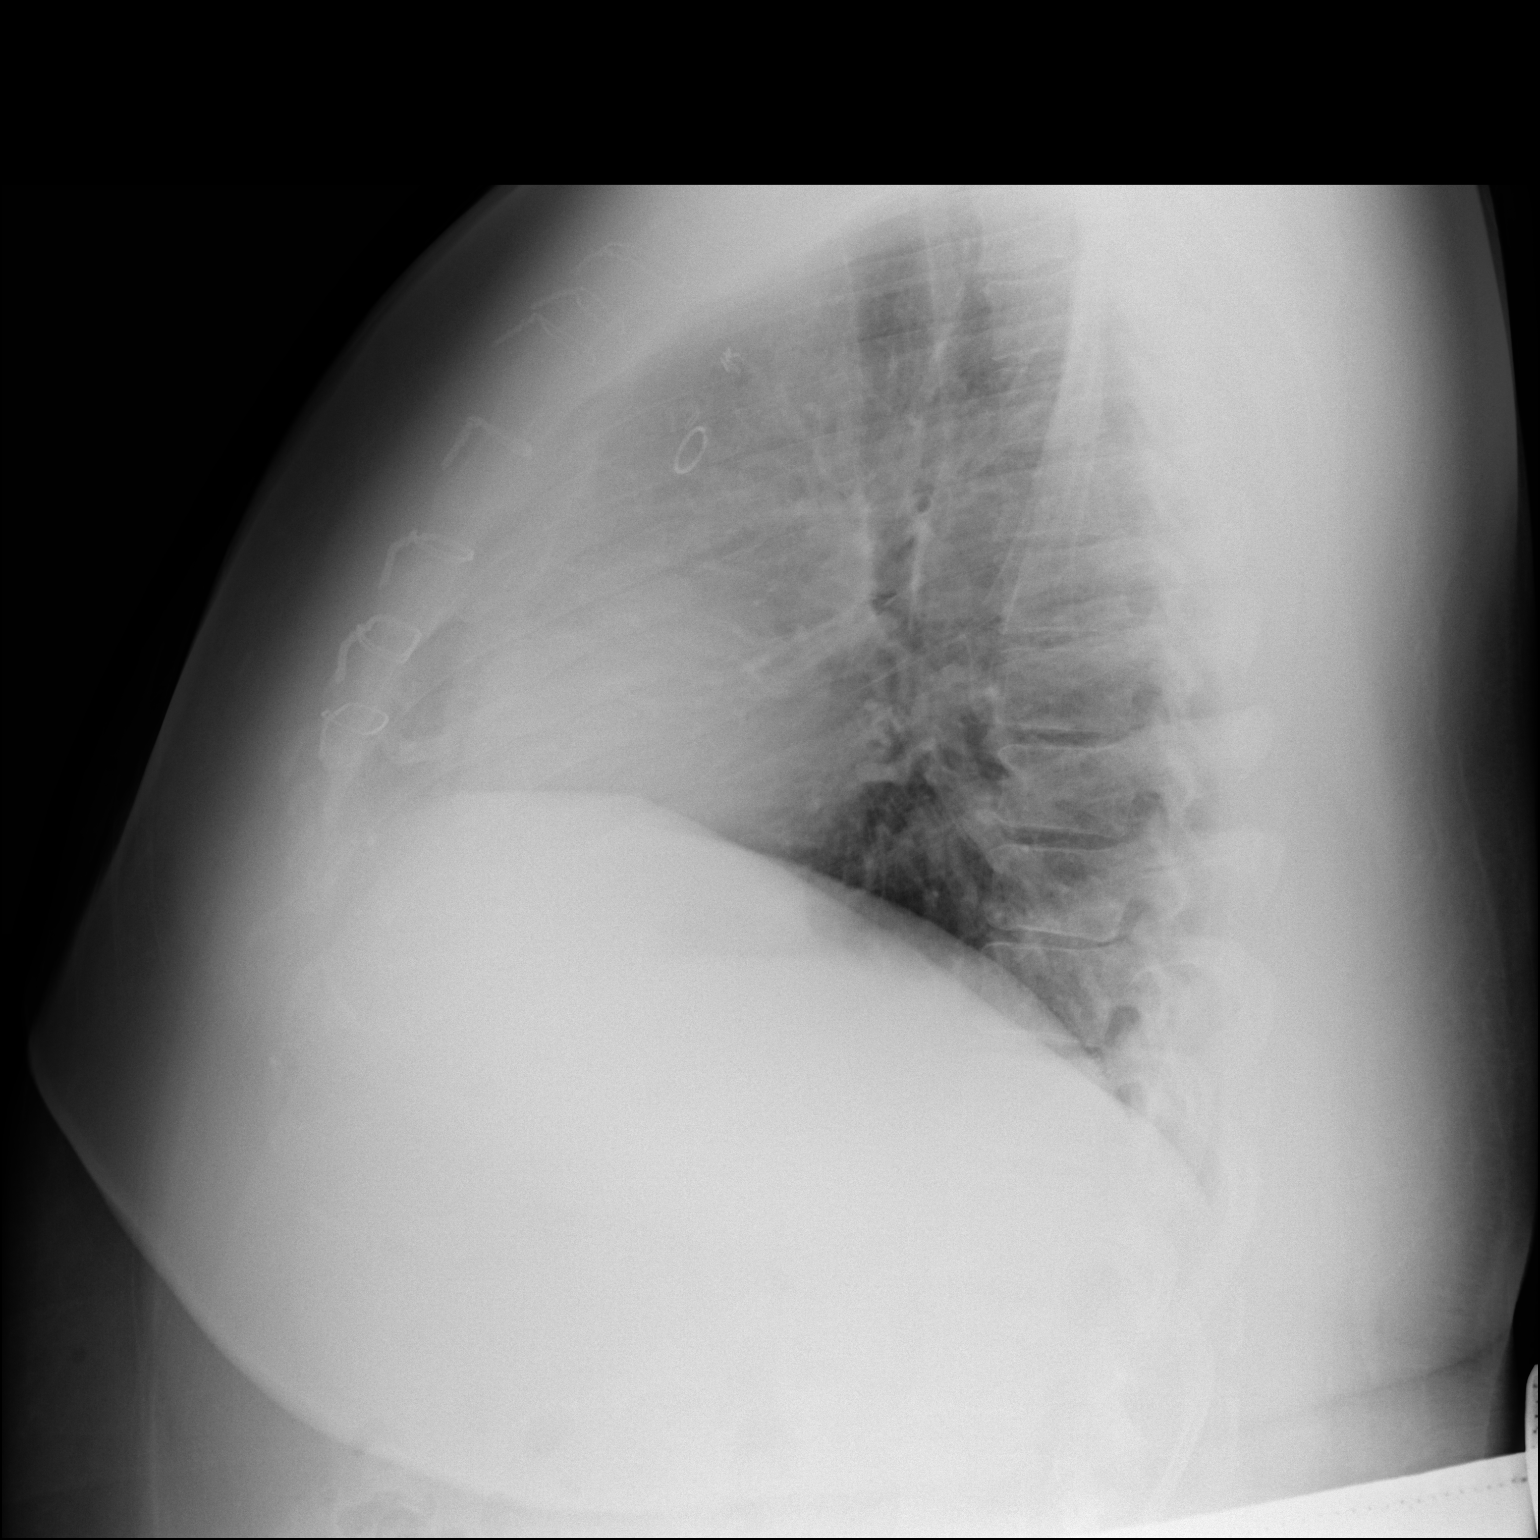

[2 of 2 positions shown; findings below may reference images not displayed]

FINDINGS: Upper normal heart size post CABG.

Mediastinal contours and pulmonary vascularity normal.

Lungs clear.

No pulmonary infiltrate, pleural effusion or pneumothorax.

Improved lung volumes versus prior study.

Bones unremarkable.
IMPRESSION: Post CABG.

No acute abnormalities.

## 2019-06-07 MED FILL — WARFARIN NA 10 MG TAB: 10 | 30 days supply | Qty: 30 | Fill #3

## 2019-06-26 ENCOUNTER — Ambulatory Visit (INDEPENDENT_AMBULATORY_CARE_PROVIDER_SITE_OTHER): Payer: Self-pay | Admitting: Pharmacist

## 2019-06-26 ENCOUNTER — Other Ambulatory Visit: Payer: Self-pay

## 2019-06-26 DIAGNOSIS — I240 Acute coronary thrombosis not resulting in myocardial infarction: Secondary | ICD-10-CM

## 2019-06-26 DIAGNOSIS — D6859 Other primary thrombophilia: Secondary | ICD-10-CM

## 2019-06-26 DIAGNOSIS — Z7901 Long term (current) use of anticoagulants: Secondary | ICD-10-CM

## 2019-06-26 LAB — POCT INR: INR: 2.1 (ref 2.0–3.0)

## 2019-08-05 ENCOUNTER — Other Ambulatory Visit: Payer: Self-pay | Admitting: Internal Medicine

## 2019-08-06 ENCOUNTER — Other Ambulatory Visit: Payer: Self-pay | Admitting: Internal Medicine

## 2019-08-06 MED FILL — WARFARIN NA 10 MG TAB: 10 | 30 days supply | Qty: 30 | Fill #0

## 2019-08-14 ENCOUNTER — Other Ambulatory Visit: Payer: Self-pay

## 2019-08-14 ENCOUNTER — Ambulatory Visit (INDEPENDENT_AMBULATORY_CARE_PROVIDER_SITE_OTHER): Payer: Self-pay | Admitting: Pharmacist Clinician (PhC)/ Clinical Pharmacy Specialist

## 2019-08-14 DIAGNOSIS — I2699 Other pulmonary embolism without acute cor pulmonale: Secondary | ICD-10-CM

## 2019-08-14 DIAGNOSIS — D6859 Other primary thrombophilia: Secondary | ICD-10-CM

## 2019-08-14 DIAGNOSIS — I240 Acute coronary thrombosis not resulting in myocardial infarction: Secondary | ICD-10-CM

## 2019-08-14 DIAGNOSIS — Z7901 Long term (current) use of anticoagulants: Secondary | ICD-10-CM

## 2019-08-14 LAB — POCT INR: INR: 1.7 — AB (ref 2.0–3.0)

## 2019-09-04 ENCOUNTER — Ambulatory Visit (INDEPENDENT_AMBULATORY_CARE_PROVIDER_SITE_OTHER): Payer: PRIVATE HEALTH INSURANCE | Admitting: Pharmacist

## 2019-09-04 ENCOUNTER — Other Ambulatory Visit: Payer: Self-pay

## 2019-09-04 DIAGNOSIS — I240 Acute coronary thrombosis not resulting in myocardial infarction: Secondary | ICD-10-CM | POA: Diagnosis not present

## 2019-09-04 DIAGNOSIS — D6859 Other primary thrombophilia: Secondary | ICD-10-CM

## 2019-09-04 DIAGNOSIS — Z7901 Long term (current) use of anticoagulants: Secondary | ICD-10-CM | POA: Diagnosis not present

## 2019-09-04 DIAGNOSIS — I2699 Other pulmonary embolism without acute cor pulmonale: Secondary | ICD-10-CM | POA: Diagnosis not present

## 2019-09-04 LAB — POCT INR: INR: 1.9 — AB (ref 2.0–3.0)

## 2019-09-06 MED FILL — LOSARTAN POTASSIUM 25 MG TA: 25 | 30 days supply | Qty: 30 | Fill #3

## 2019-09-06 MED FILL — HYDROCHLOROTHIAZIDE 12.5 MG: 12.5 | 30 days supply | Qty: 30 | Fill #2

## 2019-09-06 MED FILL — hydrALAZINE HCL 25 MG TABS: 25 | 30 days supply | Qty: 15 | Fill #2

## 2019-09-06 MED FILL — METOPROLOL TARTRATE 25 MG T: 25 | 30 days supply | Qty: 60 | Fill #2

## 2019-09-24 MED FILL — WARFARIN NA 10 MG TAB: 10 | 30 days supply | Qty: 30 | Fill #0

## 2019-10-02 ENCOUNTER — Ambulatory Visit (INDEPENDENT_AMBULATORY_CARE_PROVIDER_SITE_OTHER): Payer: Self-pay | Admitting: Pharmacist Clinician (PhC)/ Clinical Pharmacy Specialist

## 2019-10-02 ENCOUNTER — Other Ambulatory Visit: Payer: Self-pay

## 2019-10-02 DIAGNOSIS — D6859 Other primary thrombophilia: Secondary | ICD-10-CM

## 2019-10-02 DIAGNOSIS — Z7901 Long term (current) use of anticoagulants: Secondary | ICD-10-CM

## 2019-10-02 DIAGNOSIS — I2699 Other pulmonary embolism without acute cor pulmonale: Secondary | ICD-10-CM

## 2019-10-02 DIAGNOSIS — I240 Acute coronary thrombosis not resulting in myocardial infarction: Secondary | ICD-10-CM

## 2019-10-02 LAB — POCT INR: INR: 2.7 (ref 2.0–3.0)

## 2019-10-09 ENCOUNTER — Ambulatory Visit: Payer: Self-pay | Admitting: Internal Medicine

## 2019-10-30 ENCOUNTER — Ambulatory Visit (INDEPENDENT_AMBULATORY_CARE_PROVIDER_SITE_OTHER): Payer: Self-pay | Admitting: Internal Medicine

## 2019-10-30 ENCOUNTER — Other Ambulatory Visit: Payer: Self-pay

## 2019-10-30 ENCOUNTER — Encounter: Payer: Self-pay | Admitting: Internal Medicine

## 2019-10-30 VITALS — BP 126/90 | HR 89 | Ht 71.0 in | Wt 315.0 lb

## 2019-10-30 DIAGNOSIS — D6859 Other primary thrombophilia: Secondary | ICD-10-CM

## 2019-10-30 DIAGNOSIS — I10 Essential (primary) hypertension: Secondary | ICD-10-CM

## 2019-10-30 DIAGNOSIS — I255 Ischemic cardiomyopathy: Secondary | ICD-10-CM

## 2019-10-30 DIAGNOSIS — Z7901 Long term (current) use of anticoagulants: Secondary | ICD-10-CM

## 2019-10-30 DIAGNOSIS — Z951 Presence of aortocoronary bypass graft: Secondary | ICD-10-CM

## 2019-10-30 NOTE — Patient Instructions (Signed)
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
## 2019-11-12 MED FILL — WARFARIN NA 10 MG TAB: 10 | 30 days supply | Qty: 30 | Fill #1

## 2019-11-13 ENCOUNTER — Ambulatory Visit (INDEPENDENT_AMBULATORY_CARE_PROVIDER_SITE_OTHER): Payer: Self-pay | Admitting: Pharmacist Clinician (PhC)/ Clinical Pharmacy Specialist

## 2019-11-13 ENCOUNTER — Other Ambulatory Visit: Payer: Self-pay

## 2019-11-13 DIAGNOSIS — I2699 Other pulmonary embolism without acute cor pulmonale: Secondary | ICD-10-CM

## 2019-11-13 DIAGNOSIS — I240 Acute coronary thrombosis not resulting in myocardial infarction: Secondary | ICD-10-CM

## 2019-11-13 DIAGNOSIS — Z7901 Long term (current) use of anticoagulants: Secondary | ICD-10-CM

## 2019-11-13 DIAGNOSIS — D6859 Other primary thrombophilia: Secondary | ICD-10-CM

## 2019-11-13 LAB — POCT INR: INR: 2.7 (ref 2.0–3.0)

## 2019-11-18 ENCOUNTER — Encounter: Payer: Self-pay | Admitting: Internal Medicine

## 2019-12-25 ENCOUNTER — Other Ambulatory Visit: Payer: Self-pay

## 2019-12-25 ENCOUNTER — Ambulatory Visit (INDEPENDENT_AMBULATORY_CARE_PROVIDER_SITE_OTHER): Payer: Self-pay | Admitting: Pharmacist Clinician (PhC)/ Clinical Pharmacy Specialist

## 2019-12-25 DIAGNOSIS — D6859 Other primary thrombophilia: Secondary | ICD-10-CM

## 2019-12-25 DIAGNOSIS — Z7901 Long term (current) use of anticoagulants: Secondary | ICD-10-CM

## 2019-12-25 DIAGNOSIS — I2699 Other pulmonary embolism without acute cor pulmonale: Secondary | ICD-10-CM

## 2019-12-25 DIAGNOSIS — I240 Acute coronary thrombosis not resulting in myocardial infarction: Secondary | ICD-10-CM

## 2019-12-25 LAB — POCT INR: INR: 1.8 — AB (ref 2.0–3.0)

## 2019-12-30 ENCOUNTER — Other Ambulatory Visit: Payer: Self-pay | Admitting: Internal Medicine

## 2019-12-30 MED FILL — WARFARIN NA 10 MG TAB: 10 | 30 days supply | Qty: 30 | Fill #0

## 2020-01-15 ENCOUNTER — Other Ambulatory Visit: Payer: Self-pay

## 2020-01-15 ENCOUNTER — Ambulatory Visit (INDEPENDENT_AMBULATORY_CARE_PROVIDER_SITE_OTHER): Payer: Self-pay | Admitting: Pharmacist Clinician (PhC)/ Clinical Pharmacy Specialist

## 2020-01-15 DIAGNOSIS — Z7901 Long term (current) use of anticoagulants: Secondary | ICD-10-CM

## 2020-01-15 DIAGNOSIS — I240 Acute coronary thrombosis not resulting in myocardial infarction: Secondary | ICD-10-CM

## 2020-01-15 DIAGNOSIS — D6859 Other primary thrombophilia: Secondary | ICD-10-CM

## 2020-01-15 DIAGNOSIS — I2699 Other pulmonary embolism without acute cor pulmonale: Secondary | ICD-10-CM

## 2020-01-15 LAB — POCT INR: INR: 4.2 — AB (ref 2.0–3.0)

## 2020-01-15 NOTE — Patient Instructions (Signed)
No warfarin today Wednesday May 19, then just 1/2 tablet Thursday May 20.  Then decrease dose to 1 tablet daily except 1/2 tablet on Mondays, Wednesdays and Fridays. Recheck in 2 weeks.  Call us with any medication changes #[phone] or concerns.

## 2020-01-23 ENCOUNTER — Encounter (HOSPITAL_BASED_OUTPATIENT_CLINIC_OR_DEPARTMENT_OTHER): Payer: Self-pay

## 2020-01-23 ENCOUNTER — Other Ambulatory Visit: Payer: Self-pay

## 2020-01-23 ENCOUNTER — Emergency Department (HOSPITAL_BASED_OUTPATIENT_CLINIC_OR_DEPARTMENT_OTHER)
Admission: EM | Admit: 2020-01-23 | Discharge: 2020-01-23 | Disposition: A | Discharge status: Home / Self Care | Payer: PRIVATE HEALTH INSURANCE | Attending: Emergency Medicine | Admitting: Emergency Medicine

## 2020-01-23 DIAGNOSIS — Z888 Allergy status to other drugs, medicaments and biological substances status: Secondary | ICD-10-CM | POA: Insufficient documentation

## 2020-01-23 DIAGNOSIS — E119 Type 2 diabetes mellitus without complications: Secondary | ICD-10-CM | POA: Insufficient documentation

## 2020-01-23 DIAGNOSIS — Z951 Presence of aortocoronary bypass graft: Secondary | ICD-10-CM | POA: Insufficient documentation

## 2020-01-23 DIAGNOSIS — Z86711 Personal history of pulmonary embolism: Secondary | ICD-10-CM | POA: Insufficient documentation

## 2020-01-23 DIAGNOSIS — Z79899 Other long term (current) drug therapy: Secondary | ICD-10-CM | POA: Insufficient documentation

## 2020-01-23 DIAGNOSIS — I824Y2 Acute embolism and thrombosis of unspecified deep veins of left proximal lower extremity: Secondary | ICD-10-CM

## 2020-01-23 DIAGNOSIS — M7989 Other specified soft tissue disorders: Secondary | ICD-10-CM

## 2020-01-23 DIAGNOSIS — R2242 Localized swelling, mass and lump, left lower limb: Secondary | ICD-10-CM | POA: Insufficient documentation

## 2020-01-23 DIAGNOSIS — Z885 Allergy status to narcotic agent status: Secondary | ICD-10-CM | POA: Insufficient documentation

## 2020-01-23 DIAGNOSIS — Z7901 Long term (current) use of anticoagulants: Secondary | ICD-10-CM | POA: Insufficient documentation

## 2020-01-23 DIAGNOSIS — Z794 Long term (current) use of insulin: Secondary | ICD-10-CM | POA: Insufficient documentation

## 2020-01-23 DIAGNOSIS — Z86718 Personal history of other venous thrombosis and embolism: Secondary | ICD-10-CM | POA: Insufficient documentation

## 2020-01-23 DIAGNOSIS — I252 Old myocardial infarction: Secondary | ICD-10-CM | POA: Insufficient documentation

## 2020-01-23 DIAGNOSIS — Z7982 Long term (current) use of aspirin: Secondary | ICD-10-CM | POA: Insufficient documentation

## 2020-01-23 HISTORY — DX: Sciatica, unspecified side: M54.30

## 2020-01-23 NOTE — Discharge Instructions (Addendum)
Please return for your ultrasound.  If your symptoms worsen, you develop new shortness of breath or chest pain or any other concerns please seek additional medical care and evaluation.  Come back tomorrow at 1030.
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
## 2020-01-23 NOTE — ED Triage Notes (Signed)
Pt reports yesterday he noticed 2 knots on his L lower leg. Pt states they are soft and not painful to touch.

## 2020-01-24 ENCOUNTER — Ambulatory Visit (HOSPITAL_BASED_OUTPATIENT_CLINIC_OR_DEPARTMENT_OTHER)
Admission: RE | Admit: 2020-01-24 | Discharge: 2020-01-24 | Disposition: A | Discharge status: Home / Self Care | Payer: Self-pay | Source: Ambulatory Visit | Attending: Physician Assistant | Admitting: Physician Assistant

## 2020-01-24 DIAGNOSIS — I824Y2 Acute embolism and thrombosis of unspecified deep veins of left proximal lower extremity: Secondary | ICD-10-CM | POA: Insufficient documentation

## 2020-02-03 ENCOUNTER — Ambulatory Visit (INDEPENDENT_AMBULATORY_CARE_PROVIDER_SITE_OTHER): Payer: Self-pay | Admitting: Pharmacist Clinician (PhC)/ Clinical Pharmacy Specialist

## 2020-02-03 ENCOUNTER — Other Ambulatory Visit: Payer: Self-pay

## 2020-02-03 DIAGNOSIS — D6859 Other primary thrombophilia: Secondary | ICD-10-CM

## 2020-02-03 DIAGNOSIS — Z7901 Long term (current) use of anticoagulants: Secondary | ICD-10-CM

## 2020-02-03 DIAGNOSIS — I240 Acute coronary thrombosis not resulting in myocardial infarction: Secondary | ICD-10-CM

## 2020-02-03 DIAGNOSIS — Z86711 Personal history of pulmonary embolism: Secondary | ICD-10-CM

## 2020-02-03 DIAGNOSIS — I2699 Other pulmonary embolism without acute cor pulmonale: Secondary | ICD-10-CM

## 2020-02-03 LAB — POCT INR: INR: 2.4 (ref 2.0–3.0)

## 2020-02-03 NOTE — Patient Instructions (Signed)
Continue with 1 tablet daily except 1/2 tablet on Mondays, Wednesdays and Fridays. Recheck in 2 weeks.  Call us with any medication changes #[phone] or concerns.

## 2020-02-17 ENCOUNTER — Ambulatory Visit (INDEPENDENT_AMBULATORY_CARE_PROVIDER_SITE_OTHER): Payer: Self-pay | Admitting: Pharmacist Clinician (PhC)/ Clinical Pharmacy Specialist

## 2020-02-17 ENCOUNTER — Other Ambulatory Visit: Payer: Self-pay

## 2020-02-17 DIAGNOSIS — D6859 Other primary thrombophilia: Secondary | ICD-10-CM

## 2020-02-17 DIAGNOSIS — Z7901 Long term (current) use of anticoagulants: Secondary | ICD-10-CM

## 2020-02-17 DIAGNOSIS — I240 Acute coronary thrombosis not resulting in myocardial infarction: Secondary | ICD-10-CM

## 2020-02-17 DIAGNOSIS — I2699 Other pulmonary embolism without acute cor pulmonale: Secondary | ICD-10-CM

## 2020-02-17 LAB — POCT INR: INR: 2.7 (ref 2.0–3.0)

## 2020-03-03 MED FILL — METFORMIN HCL 500 MG TABS: 500 | 30 days supply | Qty: 30 | Fill #0

## 2020-03-06 MED FILL — TADALAFIL 5 MG TABS: 5 | 30 days supply | Qty: 30 | Fill #0

## 2020-03-16 ENCOUNTER — Ambulatory Visit (INDEPENDENT_AMBULATORY_CARE_PROVIDER_SITE_OTHER): Payer: Self-pay | Admitting: Pharmacist Clinician (PhC)/ Clinical Pharmacy Specialist

## 2020-03-16 ENCOUNTER — Other Ambulatory Visit: Payer: Self-pay

## 2020-03-16 DIAGNOSIS — I2699 Other pulmonary embolism without acute cor pulmonale: Secondary | ICD-10-CM

## 2020-03-16 DIAGNOSIS — I240 Acute coronary thrombosis not resulting in myocardial infarction: Secondary | ICD-10-CM

## 2020-03-16 DIAGNOSIS — Z7901 Long term (current) use of anticoagulants: Secondary | ICD-10-CM

## 2020-03-16 DIAGNOSIS — D6859 Other primary thrombophilia: Secondary | ICD-10-CM

## 2020-03-16 LAB — POCT INR: INR: 1.8 — AB (ref 2.0–3.0)

## 2020-03-16 NOTE — Patient Instructions (Signed)
Take 1 tablet today Monday July 19, then continue with 1 tablet daily except 1/2 tablet on Mondays, Wednesdays and Fridays. Recheck in 3 weeks.  Call us with any medication changes #[phone] or concerns.

## 2020-03-27 ENCOUNTER — Telehealth: Payer: Self-pay | Admitting: Internal Medicine

## 2020-03-27 NOTE — Telephone Encounter (Signed)
FMLA paperwork was left in Dr.Hilty mailbox on 03/27/2020. 

## 2020-04-06 ENCOUNTER — Ambulatory Visit (INDEPENDENT_AMBULATORY_CARE_PROVIDER_SITE_OTHER): Payer: Self-pay

## 2020-04-06 ENCOUNTER — Other Ambulatory Visit: Payer: Self-pay

## 2020-04-06 DIAGNOSIS — I240 Acute coronary thrombosis not resulting in myocardial infarction: Secondary | ICD-10-CM

## 2020-04-06 DIAGNOSIS — D6859 Other primary thrombophilia: Secondary | ICD-10-CM

## 2020-04-06 DIAGNOSIS — Z5181 Encounter for therapeutic drug level monitoring: Secondary | ICD-10-CM

## 2020-04-06 DIAGNOSIS — I2699 Other pulmonary embolism without acute cor pulmonale: Secondary | ICD-10-CM

## 2020-04-06 DIAGNOSIS — Z7901 Long term (current) use of anticoagulants: Secondary | ICD-10-CM

## 2020-04-06 LAB — POCT INR: INR: 2.5 (ref 2.0–3.0)

## 2020-04-06 NOTE — Patient Instructions (Signed)
continue with 1 tablet daily except 1/2 tablet on Mondays, Wednesdays and Fridays. Recheck in 5 weeks.  Call us with any medication changes #[phone] or concerns.

## 2020-04-13 ENCOUNTER — Other Ambulatory Visit: Payer: Self-pay | Admitting: Internal Medicine

## 2020-04-13 ENCOUNTER — Other Ambulatory Visit: Payer: Self-pay | Admitting: Cardiology

## 2020-04-13 MED FILL — METOPROLOL TARTRATE 25 MG T: 25 | 30 days supply | Qty: 60 | Fill #0

## 2020-04-13 MED FILL — WARFARIN NA 10 MG TAB: 10 | 30 days supply | Qty: 30 | Fill #0 | Status: TO

## 2020-04-14 MED FILL — hydrALAZINE HCL 25 MG TABS: 25 | 30 days supply | Qty: 15 | Fill #0

## 2020-04-14 MED FILL — HYDROCHLOROTHIAZIDE 12.5 MG: 12.5 | 30 days supply | Qty: 30 | Fill #0

## 2020-04-14 MED FILL — LOSARTAN POTASSIUM 25 MG TA: 25 | 30 days supply | Qty: 30 | Fill #0

## 2020-04-22 ENCOUNTER — Telehealth: Payer: Self-pay | Admitting: Internal Medicine

## 2020-04-22 NOTE — Telephone Encounter (Signed)
Completed Attending physician statement forms were faxed to [employer] on 04/21/20. Tried to contact patient on 04/21/20 but mailbox was full.  Patient was notified on 04/22/20 forms were faxed.04/22/20 fsw

## 2020-05-11 ENCOUNTER — Ambulatory Visit (INDEPENDENT_AMBULATORY_CARE_PROVIDER_SITE_OTHER): Payer: Self-pay

## 2020-05-11 ENCOUNTER — Other Ambulatory Visit: Payer: Self-pay

## 2020-05-11 DIAGNOSIS — Z7901 Long term (current) use of anticoagulants: Secondary | ICD-10-CM

## 2020-05-11 DIAGNOSIS — I240 Acute coronary thrombosis not resulting in myocardial infarction: Secondary | ICD-10-CM

## 2020-05-11 DIAGNOSIS — I2699 Other pulmonary embolism without acute cor pulmonale: Secondary | ICD-10-CM

## 2020-05-11 LAB — POCT INR: INR: 3.4 — AB (ref 2.0–3.0)

## 2020-05-11 NOTE — Patient Instructions (Signed)
Hold today and then continue with 1 tablet daily except 1/2 tablet on Mondays, Wednesdays and Fridays. Recheck in 4 weeks.  Call us with any medication changes #[phone] or concerns.

## 2020-05-18 ENCOUNTER — Other Ambulatory Visit: Payer: Self-pay

## 2020-05-18 ENCOUNTER — Inpatient Hospital Stay (HOSPITAL_BASED_OUTPATIENT_CLINIC_OR_DEPARTMENT_OTHER)
Admission: EM | Admit: 2020-05-18 | Discharge: 2020-05-29 | DRG: 177 | Disposition: A | Discharge status: Home / Self Care | Payer: HRSA Program | Attending: Internal Medicine | Admitting: Internal Medicine

## 2020-05-18 ENCOUNTER — Emergency Department (HOSPITAL_BASED_OUTPATIENT_CLINIC_OR_DEPARTMENT_OTHER): Payer: HRSA Program

## 2020-05-18 ENCOUNTER — Encounter (HOSPITAL_BASED_OUTPATIENT_CLINIC_OR_DEPARTMENT_OTHER): Payer: Self-pay | Admitting: *Deleted

## 2020-05-18 DIAGNOSIS — E1165 Type 2 diabetes mellitus with hyperglycemia: Secondary | ICD-10-CM | POA: Diagnosis present

## 2020-05-18 DIAGNOSIS — Z6841 Body Mass Index (BMI) 40.0 and over, adult: Secondary | ICD-10-CM

## 2020-05-18 DIAGNOSIS — Y929 Unspecified place or not applicable: Secondary | ICD-10-CM

## 2020-05-18 DIAGNOSIS — R040 Epistaxis: Secondary | ICD-10-CM | POA: Diagnosis present

## 2020-05-18 DIAGNOSIS — Z79899 Other long term (current) drug therapy: Secondary | ICD-10-CM

## 2020-05-18 DIAGNOSIS — J9601 Acute respiratory failure with hypoxia: Secondary | ICD-10-CM | POA: Diagnosis present

## 2020-05-18 DIAGNOSIS — J9600 Acute respiratory failure, unspecified whether with hypoxia or hypercapnia: Secondary | ICD-10-CM

## 2020-05-18 DIAGNOSIS — Z86718 Personal history of other venous thrombosis and embolism: Secondary | ICD-10-CM

## 2020-05-18 DIAGNOSIS — Z951 Presence of aortocoronary bypass graft: Secondary | ICD-10-CM

## 2020-05-18 DIAGNOSIS — Z86711 Personal history of pulmonary embolism: Secondary | ICD-10-CM

## 2020-05-18 DIAGNOSIS — Z7901 Long term (current) use of anticoagulants: Secondary | ICD-10-CM

## 2020-05-18 DIAGNOSIS — Z8249 Family history of ischemic heart disease and other diseases of the circulatory system: Secondary | ICD-10-CM

## 2020-05-18 DIAGNOSIS — Z96652 Presence of left artificial knee joint: Secondary | ICD-10-CM | POA: Diagnosis present

## 2020-05-18 DIAGNOSIS — J1282 Pneumonia due to coronavirus disease 2019: Secondary | ICD-10-CM | POA: Diagnosis present

## 2020-05-18 DIAGNOSIS — U071 COVID-19: Principal | ICD-10-CM | POA: Diagnosis present

## 2020-05-18 DIAGNOSIS — Z91041 Radiographic dye allergy status: Secondary | ICD-10-CM

## 2020-05-18 DIAGNOSIS — Z7982 Long term (current) use of aspirin: Secondary | ICD-10-CM

## 2020-05-18 DIAGNOSIS — F419 Anxiety disorder, unspecified: Secondary | ICD-10-CM | POA: Diagnosis present

## 2020-05-18 DIAGNOSIS — I10 Essential (primary) hypertension: Secondary | ICD-10-CM | POA: Diagnosis present

## 2020-05-18 DIAGNOSIS — T380X5A Adverse effect of glucocorticoids and synthetic analogues, initial encounter: Secondary | ICD-10-CM | POA: Diagnosis present

## 2020-05-18 DIAGNOSIS — K219 Gastro-esophageal reflux disease without esophagitis: Secondary | ICD-10-CM | POA: Diagnosis present

## 2020-05-18 DIAGNOSIS — Z794 Long term (current) use of insulin: Secondary | ICD-10-CM

## 2020-05-18 DIAGNOSIS — R0902 Hypoxemia: Secondary | ICD-10-CM

## 2020-05-18 DIAGNOSIS — Z888 Allergy status to other drugs, medicaments and biological substances status: Secondary | ICD-10-CM

## 2020-05-18 HISTORY — DX: Disease of blood and blood-forming organs, unspecified: D75.9

## 2020-05-18 LAB — URINALYSIS, ROUTINE W REFLEX MICROSCOPIC
Bilirubin Urine: NEGATIVE
Glucose, UA: >=500 mg/dL — AB
Ketones, ur: 40 mg/dL — AB
Leukocytes,Ua: NEGATIVE
Nitrite: NEGATIVE
Protein, ur: NEGATIVE mg/dL
Specific Gravity, Urine: 1.01 (ref 1.005–1.030)
pH: 5.5 (ref 5.0–8.0)

## 2020-05-18 LAB — HEPATIC FUNCTION PANEL
ALT: 68 U/L — ABNORMAL HIGH (ref 0–44)
AST: 96 U/L — ABNORMAL HIGH (ref 15–41)
Albumin: 2.9 g/dL — ABNORMAL LOW (ref 3.5–5.0)
Alkaline Phosphatase: 60 U/L (ref 38–126)
Bilirubin, Direct: 0.2 mg/dL (ref 0.0–0.2)
Indirect Bilirubin: 0.8 mg/dL (ref 0.3–0.9)
Total Bilirubin: 1 mg/dL (ref 0.3–1.2)
Total Protein: 6.8 g/dL (ref 6.5–8.1)

## 2020-05-18 LAB — C-REACTIVE PROTEIN: CRP: 32.6 mg/dL — ABNORMAL HIGH (ref <1.0)

## 2020-05-18 LAB — CBC WITH DIFFERENTIAL/PLATELET
Abs Immature Granulocytes: 0.03 10*3/uL (ref 0.00–0.07)
Basophils Absolute: 0 10*3/uL (ref 0.0–0.1)
Basophils Relative: 0 %
Eosinophils Absolute: 0 10*3/uL (ref 0.0–0.5)
Eosinophils Relative: 0 %
HCT: 44 % (ref 39.0–52.0)
Hemoglobin: 14.4 g/dL (ref 13.0–17.0)
Immature Granulocytes: 1 %
Lymphocytes Relative: 14 %
Lymphs Abs: 0.8 10*3/uL (ref 0.7–4.0)
MCH: 27.3 pg (ref 26.0–34.0)
MCHC: 32.7 g/dL (ref 30.0–36.0)
MCV: 83.5 fL (ref 80.0–100.0)
Monocytes Absolute: 0.4 10*3/uL (ref 0.1–1.0)
Monocytes Relative: 6 %
Neutro Abs: 4.5 10*3/uL (ref 1.7–7.7)
Neutrophils Relative %: 79 %
Platelets: 173 10*3/uL (ref 150–400)
RBC: 5.27 MIL/uL (ref 4.22–5.81)
RDW: 13.8 % (ref 11.5–15.5)
WBC: 5.7 10*3/uL (ref 4.0–10.5)
nRBC: 0 % (ref 0.0–0.2)

## 2020-05-18 LAB — LACTIC ACID, PLASMA
Lactic Acid, Venous: 2.1 mmol/L (ref 0.5–1.9)
Lactic Acid, Venous: 2.4 mmol/L (ref 0.5–1.9)

## 2020-05-18 LAB — SARS CORONAVIRUS 2 BY RT PCR (HOSPITAL ORDER, PERFORMED IN ~~LOC~~ HOSPITAL LAB): SARS Coronavirus 2: POSITIVE — AB

## 2020-05-18 LAB — BASIC METABOLIC PANEL
Anion gap: 15 (ref 5–15)
BUN: 19 mg/dL (ref 6–20)
CO2: 21 mmol/L — ABNORMAL LOW (ref 22–32)
Calcium: 8.4 mg/dL — ABNORMAL LOW (ref 8.9–10.3)
Chloride: 91 mmol/L — ABNORMAL LOW (ref 98–111)
Creatinine, Ser: 1.11 mg/dL (ref 0.61–1.24)
GFR calc Af Amer: >60 mL/min (ref >60)
GFR calc non Af Amer: >60 mL/min (ref >60)
Glucose, Bld: 553 mg/dL (ref 70–99)
Potassium: 5.1 mmol/L (ref 3.5–5.1)
Sodium: 127 mmol/L — ABNORMAL LOW (ref 135–145)

## 2020-05-18 LAB — LACTATE DEHYDROGENASE: LDH: 515 U/L — ABNORMAL HIGH (ref 98–192)

## 2020-05-18 LAB — PROTIME-INR
INR: 1 (ref 0.8–1.2)
Prothrombin Time: 12.5 seconds (ref 11.4–15.2)

## 2020-05-18 LAB — TRIGLYCERIDES: Triglycerides: 271 mg/dL — ABNORMAL HIGH (ref <150)

## 2020-05-18 LAB — URINALYSIS, MICROSCOPIC (REFLEX)

## 2020-05-18 LAB — FERRITIN: Ferritin: 1356 ng/mL — ABNORMAL HIGH (ref 24–336)

## 2020-05-18 LAB — CBG MONITORING, ED: Glucose-Capillary: 491 mg/dL — ABNORMAL HIGH (ref 70–99)

## 2020-05-18 LAB — FIBRINOGEN: Fibrinogen: 670 mg/dL — ABNORMAL HIGH (ref 210–475)

## 2020-05-18 LAB — D-DIMER, QUANTITATIVE: D-Dimer, Quant: 0.67 ug/mL-FEU — ABNORMAL HIGH (ref 0.00–0.50)

## 2020-05-18 LAB — PROCALCITONIN: Procalcitonin: 0.42 ng/mL

## 2020-05-18 MED ORDER — INSULIN GLARGINE 100 UNIT/ML ~~LOC~~ SOLN
30.0000 [IU] | Freq: Once | SUBCUTANEOUS | Status: AC
  Administered 2020-05-18: 30 [IU] via SUBCUTANEOUS
  Filled 2020-05-18: qty 1

## 2020-05-18 MED ORDER — SODIUM CHLORIDE 0.9 % IV BOLUS
500.0000 mL | Freq: Once | INTRAVENOUS | Status: AC
  Administered 2020-05-19: 500 mL via INTRAVENOUS

## 2020-05-18 MED ORDER — SODIUM CHLORIDE 0.9 % IV SOLN
100.0000 mg | Freq: Once | INTRAVENOUS | Status: AC
  Administered 2020-05-18: 100 mg via INTRAVENOUS
  Filled 2020-05-18: qty 20

## 2020-05-18 MED ORDER — SODIUM CHLORIDE 0.9 % IV SOLN
100.0000 mg | Freq: Every day | INTRAVENOUS | Status: AC
  Administered 2020-05-19 – 2020-05-22 (×4): 100 mg via INTRAVENOUS
  Filled 2020-05-18 (×4): qty 20

## 2020-05-18 MED ORDER — METHYLPREDNISOLONE SODIUM SUCC 125 MG IJ SOLR
60.0000 mg | Freq: Once | INTRAMUSCULAR | Status: AC
  Administered 2020-05-18: 60 mg via INTRAVENOUS
  Filled 2020-05-18: qty 2

## 2020-05-18 NOTE — ED Triage Notes (Addendum)
Sob for a week. His MD sent him here to r/o Covid. Hx of PE for the past year. He takes Coumadin.

## 2020-05-18 NOTE — ED Notes (Signed)
Patient SpO2 in room 72-74 on r/a.  BBS decreased, + DOE.  Placed on HFNC @ 11l/m SpO2 89-91%.

## 2020-05-18 NOTE — ED Notes (Signed)
Date and time results received: 05/18/20 1959  Test: glucose Critical Value: 553  Name of Provider Notified: [place] PA  Orders Received? Or Actions Taken?: awaiting new orders

## 2020-05-18 NOTE — ED Notes (Signed)
Pt able to speak full sentences, no acute resp distress noted.

## 2020-05-18 NOTE — ED Notes (Signed)
Date and time results received: 05/18/20 2130  Test: lactic acid Critical Value: 2.1  Name of Provider Notified: [place] PA  Orders Received? Or Actions Taken?: no new orders
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
## 2020-05-19 DIAGNOSIS — J1282 Pneumonia due to Coronavirus disease 2019: Secondary | ICD-10-CM | POA: Diagnosis present

## 2020-05-19 DIAGNOSIS — Z91041 Radiographic dye allergy status: Secondary | ICD-10-CM | POA: Diagnosis not present

## 2020-05-19 DIAGNOSIS — Z96652 Presence of left artificial knee joint: Secondary | ICD-10-CM | POA: Diagnosis present

## 2020-05-19 DIAGNOSIS — Z86718 Personal history of other venous thrombosis and embolism: Secondary | ICD-10-CM | POA: Diagnosis not present

## 2020-05-19 DIAGNOSIS — E1165 Type 2 diabetes mellitus with hyperglycemia: Secondary | ICD-10-CM | POA: Diagnosis not present

## 2020-05-19 DIAGNOSIS — U071 COVID-19: Secondary | ICD-10-CM | POA: Diagnosis present

## 2020-05-19 DIAGNOSIS — I10 Essential (primary) hypertension: Secondary | ICD-10-CM | POA: Diagnosis present

## 2020-05-19 DIAGNOSIS — Y929 Unspecified place or not applicable: Secondary | ICD-10-CM | POA: Diagnosis not present

## 2020-05-19 DIAGNOSIS — Z6841 Body Mass Index (BMI) 40.0 and over, adult: Secondary | ICD-10-CM | POA: Diagnosis not present

## 2020-05-19 DIAGNOSIS — Z79899 Other long term (current) drug therapy: Secondary | ICD-10-CM | POA: Diagnosis not present

## 2020-05-19 DIAGNOSIS — R0902 Hypoxemia: Secondary | ICD-10-CM | POA: Diagnosis not present

## 2020-05-19 DIAGNOSIS — Z888 Allergy status to other drugs, medicaments and biological substances status: Secondary | ICD-10-CM | POA: Diagnosis not present

## 2020-05-19 DIAGNOSIS — F419 Anxiety disorder, unspecified: Secondary | ICD-10-CM | POA: Diagnosis present

## 2020-05-19 DIAGNOSIS — J9601 Acute respiratory failure with hypoxia: Secondary | ICD-10-CM | POA: Diagnosis present

## 2020-05-19 DIAGNOSIS — R040 Epistaxis: Secondary | ICD-10-CM | POA: Diagnosis present

## 2020-05-19 DIAGNOSIS — Z794 Long term (current) use of insulin: Secondary | ICD-10-CM | POA: Diagnosis not present

## 2020-05-19 DIAGNOSIS — Z7982 Long term (current) use of aspirin: Secondary | ICD-10-CM | POA: Diagnosis not present

## 2020-05-19 DIAGNOSIS — K219 Gastro-esophageal reflux disease without esophagitis: Secondary | ICD-10-CM | POA: Diagnosis present

## 2020-05-19 DIAGNOSIS — Z86711 Personal history of pulmonary embolism: Secondary | ICD-10-CM | POA: Diagnosis not present

## 2020-05-19 DIAGNOSIS — Z8249 Family history of ischemic heart disease and other diseases of the circulatory system: Secondary | ICD-10-CM | POA: Diagnosis not present

## 2020-05-19 DIAGNOSIS — Z7901 Long term (current) use of anticoagulants: Secondary | ICD-10-CM | POA: Diagnosis not present

## 2020-05-19 DIAGNOSIS — Z951 Presence of aortocoronary bypass graft: Secondary | ICD-10-CM | POA: Diagnosis not present

## 2020-05-19 DIAGNOSIS — J9600 Acute respiratory failure, unspecified whether with hypoxia or hypercapnia: Secondary | ICD-10-CM | POA: Diagnosis not present

## 2020-05-19 DIAGNOSIS — T380X5A Adverse effect of glucocorticoids and synthetic analogues, initial encounter: Secondary | ICD-10-CM | POA: Diagnosis present

## 2020-05-19 LAB — BETA-HYDROXYBUTYRIC ACID: Beta-Hydroxybutyric Acid: 3.31 mmol/L — ABNORMAL HIGH (ref 0.05–0.27)

## 2020-05-19 LAB — CBG MONITORING, ED
Glucose-Capillary: 476 mg/dL — ABNORMAL HIGH (ref 70–99)
Glucose-Capillary: 461 mg/dL — ABNORMAL HIGH (ref 70–99)
Glucose-Capillary: 394 mg/dL — ABNORMAL HIGH (ref 70–99)
Glucose-Capillary: 432 mg/dL — ABNORMAL HIGH (ref 70–99)
Glucose-Capillary: 433 mg/dL — ABNORMAL HIGH (ref 70–99)
Glucose-Capillary: 422 mg/dL — ABNORMAL HIGH (ref 70–99)

## 2020-05-19 LAB — HEMOGLOBIN A1C
Hgb A1c MFr Bld: 14.4 % — ABNORMAL HIGH (ref 4.8–5.6)
Mean Plasma Glucose: 366.58 mg/dL

## 2020-05-19 LAB — PROTIME-INR
INR: 1 (ref 0.8–1.2)
Prothrombin Time: 12.7 seconds (ref 11.4–15.2)

## 2020-05-19 LAB — GLUCOSE, CAPILLARY: Glucose-Capillary: 325 mg/dL — ABNORMAL HIGH (ref 70–99)

## 2020-05-19 LAB — HEPARIN LEVEL (UNFRACTIONATED): Heparin Unfractionated: 0.54 IU/mL (ref 0.30–0.70)

## 2020-05-19 MED ORDER — HEPARIN BOLUS VIA INFUSION
5000.0000 [IU] | Freq: Once | INTRAVENOUS | Status: AC
  Administered 2020-05-19: 5000 [IU] via INTRAVENOUS

## 2020-05-19 MED ORDER — WARFARIN SODIUM 5 MG PO TABS
5.0000 mg | ORAL_TABLET | Freq: Every day | ORAL | Status: DC
  Administered 2020-05-19 – 2020-05-20 (×2): 5 mg via ORAL
  Filled 2020-05-19 (×2): qty 1

## 2020-05-19 MED ORDER — INSULIN GLARGINE 100 UNIT/ML ~~LOC~~ SOLN
35.0000 [IU] | Freq: Every day | SUBCUTANEOUS | Status: DC
  Administered 2020-05-19: 35 [IU] via SUBCUTANEOUS
  Filled 2020-05-19 (×2): qty 0.35

## 2020-05-19 MED ORDER — DEXAMETHASONE SODIUM PHOSPHATE 10 MG/ML IJ SOLN
6.0000 mg | Freq: Once | INTRAMUSCULAR | Status: AC
  Administered 2020-05-19: 6 mg via INTRAVENOUS
  Filled 2020-05-19: qty 1

## 2020-05-19 MED ORDER — INSULIN ASPART 100 UNIT/ML ~~LOC~~ SOLN
5.0000 [IU] | Freq: Once | SUBCUTANEOUS | Status: AC
  Administered 2020-05-19: 5 [IU] via SUBCUTANEOUS
  Filled 2020-05-19: qty 5

## 2020-05-19 MED ORDER — INSULIN ASPART 100 UNIT/ML ~~LOC~~ SOLN
0.0000 [IU] | Freq: Three times a day (TID) | SUBCUTANEOUS | Status: DC
  Administered 2020-05-19 (×3): 20 [IU] via SUBCUTANEOUS
  Filled 2020-05-19 (×3): qty 20

## 2020-05-19 MED ORDER — HEPARIN (PORCINE) 25000 UT/250ML-% IV SOLN
1500.0000 [IU]/h | INTRAVENOUS | Status: AC
  Administered 2020-05-19 – 2020-05-20 (×2): 1500 [IU]/h via INTRAVENOUS
  Filled 2020-05-19 (×2): qty 250

## 2020-05-19 NOTE — ED Provider Notes (Signed)
Notify the patient's blood sugar is still elevated.  Patient is a diabetic being admitted for Covid.  He has been started on steroids.  He was given 5 units of insulin at 6 AM but patient does not have any his home meds ordered.  I will order a sliding scale insulin protocol   Linwood Dibbles, MD 05/19/20 [phone]

## 2020-05-19 NOTE — Progress Notes (Addendum)
   05/19/20 2254  Provider Notification  Provider Name/Title V. Rathore  Date Provider Notified 05/19/20  Time Provider Notified 2254  Notification Type Page  Notification Reason Other (Comment) (BG-325,severe headache,burning in his nose)  Response Other (Comment) (waiting for order)     05/20/2020 0019:  I received a callback from Dr Loney Loh and was instructed to contact the admitter for the night.

## 2020-05-19 NOTE — ED Notes (Signed)
[facility] at bedside for pt's transfer

## 2020-05-19 NOTE — Progress Notes (Signed)
ANTICOAGULATION CONSULT NOTE - Initial Consult  Pharmacy Consult for heparin Indication: VTE prophylaxis  Allergies  Allergen Reactions  . Contrast Media [Iodinated Diagnostic Agents] Itching    Pt states he thinks it makes him itch  . Lovenox [Enoxaparin Sodium] Itching    Itching over entire body  . Percocet [Oxycodone-Acetaminophen] Hives and Itching    Patient Measurements: Height: 5\' 10"  (177.8 cm) Weight: (!) 143.8 kg (317 lb) IBW/kg (Calculated) : 73 Heparin Dosing Weight: 107 kg   Vital Signs: BP: 104/60 (09/21 1330) Pulse Rate: 83 (09/21 1330)  Labs: Recent Labs    05/18/20 1925 05/19/20 0807  HGB 14.4  --   HCT 44.0  --   PLT 173  --   LABPROT 12.5 12.7  INR 1.0 1.0  CREATININE 1.11  --     Estimated Creatinine Clearance: 116.6 mL/min (by C-G formula based on SCr of 1.11 mg/dL).   Medical History: Past Medical History:  Diagnosis Date  . Anemia age 83 or 7   none since  . Arthritis    oa  . Diabetes mellitus without complication (HCC)    newly diagnosed  . DVT (deep venous thrombosis) (HCC)   . GERD (gastroesophageal reflux disease)   . Head problem    back of top of head area swells at times, had glass in wound at times, pt instrcuted to follow up with md, if any pus seen  . Headache(784.0)    migraine  . Morbid obesity (HCC)   . PE (pulmonary embolism) 10/01/2014  . Prediabetes on 04-28-14  . Sciatica     Medications:  Infusions:  . heparin    . remdesivir 100 mg in NS 100 mL Stopped (05/19/20 1036)    Assessment:  Patient with history of recurrent clots, on long-term warfarin - current INR = 1.0 (down from 3.4 eight days ago); patient also has itching allergy to lovenox but has received heparin at [facility] facilities in the recent past.  Goal of Therapy:  Heparin level 0.3-0.7 units/ml Monitor platelets by anticoagulation protocol: Yes   Plan:  Give 5000 units bolus x 1 Start heparin infusion at 1500 units/hr Check anti-Xa level in  6 hours and daily while on heparin Continue to monitor H&H and platelets  [facility] [name] 05/19/2020,2:35 PM

## 2020-05-19 NOTE — Progress Notes (Signed)
2240: Patient is complaining of burning in his nose and headache and is not tolerating the heated highflow. I called RT and  Instructed  to turned down O2 from 60LPM to 40LPM to see if it's help.   2300:Current SPO2 @ 94%, Pt on 40LPM FIO2 @100 %

## 2020-05-19 NOTE — ED Notes (Signed)
Desat to 80% on 15l HFNC with exertion changing sheets, placed on NRB and 15L HFNC.

## 2020-05-19 NOTE — Progress Notes (Signed)
ANTICOAGULATION CONSULT NOTE - Follow Up Consult  Pharmacy Consult for heparin Indication: h/o recurrent VTE  Labs: Recent Labs    05/18/20 1925 05/19/20 0807 05/19/20 2210  HGB 14.4  --   --   HCT 44.0  --   --   PLT 173  --   --   LABPROT 12.5 12.7  --   INR 1.0 1.0  --   HEPARINUNFRC  --   --  0.54  CREATININE 1.11  --   --     Assessment/Plan:  49yo male therapeutic on heparin with initial dosing while INR low. Will continue gtt at current rate and confirm stable with am labs.   Vernard Gambles, PharmD, BCPS  05/19/2020,11:42 PM

## 2020-05-19 NOTE — ED Notes (Signed)
Checked CBG 476 RN Harvin Hazel informed

## 2020-05-20 ENCOUNTER — Encounter (HOSPITAL_COMMUNITY): Payer: Self-pay | Admitting: Internal Medicine

## 2020-05-20 DIAGNOSIS — R0902 Hypoxemia: Secondary | ICD-10-CM

## 2020-05-20 DIAGNOSIS — E1165 Type 2 diabetes mellitus with hyperglycemia: Secondary | ICD-10-CM

## 2020-05-20 DIAGNOSIS — U071 COVID-19: Principal | ICD-10-CM

## 2020-05-20 DIAGNOSIS — J1282 Pneumonia due to coronavirus disease 2019: Secondary | ICD-10-CM

## 2020-05-20 DIAGNOSIS — Z86711 Personal history of pulmonary embolism: Secondary | ICD-10-CM

## 2020-05-20 DIAGNOSIS — Z794 Long term (current) use of insulin: Secondary | ICD-10-CM

## 2020-05-20 LAB — BLOOD GAS, ARTERIAL
Acid-base deficit: 1.8 mmol/L (ref 0.0–2.0)
Bicarbonate: 21.7 mmol/L (ref 20.0–28.0)
Drawn by: 51702
FIO2: 100
O2 Saturation: 79.8 %
Patient temperature: 37.1
pCO2 arterial: 32.4 mmHg (ref 32.0–48.0)
pH, Arterial: 7.441 (ref 7.350–7.450)
pO2, Arterial: 45.4 mmHg — ABNORMAL LOW (ref 83.0–108.0)

## 2020-05-20 LAB — CBC
HCT: 44.7 % (ref 39.0–52.0)
Hemoglobin: 14.6 g/dL (ref 13.0–17.0)
MCH: 27.1 pg (ref 26.0–34.0)
MCHC: 32.7 g/dL (ref 30.0–36.0)
MCV: 83.1 fL (ref 80.0–100.0)
Platelets: 225 10*3/uL (ref 150–400)
RBC: 5.38 MIL/uL (ref 4.22–5.81)
RDW: 13.8 % (ref 11.5–15.5)
WBC: 11.9 10*3/uL — ABNORMAL HIGH (ref 4.0–10.5)
nRBC: 0 % (ref 0.0–0.2)

## 2020-05-20 LAB — URINALYSIS, ROUTINE W REFLEX MICROSCOPIC
Bilirubin Urine: NEGATIVE
Glucose, UA: >=500 mg/dL — AB
Ketones, ur: 15 mg/dL — AB
Leukocytes,Ua: NEGATIVE
Nitrite: NEGATIVE
Protein, ur: NEGATIVE mg/dL
Specific Gravity, Urine: 1.025 (ref 1.005–1.030)
pH: 5 (ref 5.0–8.0)

## 2020-05-20 LAB — URINALYSIS, MICROSCOPIC (REFLEX)

## 2020-05-20 LAB — GLUCOSE, CAPILLARY
Glucose-Capillary: 313 mg/dL — ABNORMAL HIGH (ref 70–99)
Glucose-Capillary: 324 mg/dL — ABNORMAL HIGH (ref 70–99)
Glucose-Capillary: 343 mg/dL — ABNORMAL HIGH (ref 70–99)
Glucose-Capillary: 376 mg/dL — ABNORMAL HIGH (ref 70–99)
Glucose-Capillary: 450 mg/dL — ABNORMAL HIGH (ref 70–99)

## 2020-05-20 LAB — HEPARIN LEVEL (UNFRACTIONATED): Heparin Unfractionated: 0.42 IU/mL (ref 0.30–0.70)

## 2020-05-20 LAB — HIV ANTIBODY (ROUTINE TESTING W REFLEX): HIV Screen 4th Generation wRfx: NONREACTIVE

## 2020-05-20 MED ORDER — LINAGLIPTIN 5 MG PO TABS
5.0000 mg | ORAL_TABLET | Freq: Every day | ORAL | Status: DC
  Administered 2020-05-20 – 2020-05-29 (×10): 5 mg via ORAL
  Filled 2020-05-20 (×10): qty 1

## 2020-05-20 MED ORDER — SODIUM CHLORIDE 0.9 % IV SOLN
2.0000 g | INTRAVENOUS | Status: DC
  Administered 2020-05-20: 2 g via INTRAVENOUS
  Filled 2020-05-20: qty 20

## 2020-05-20 MED ORDER — SODIUM CHLORIDE 0.9 % IV SOLN
1.0000 g | INTRAVENOUS | Status: DC
  Filled 2020-05-20: qty 10

## 2020-05-20 MED ORDER — INSULIN ASPART 100 UNIT/ML ~~LOC~~ SOLN: 0.0000 [IU] | Freq: Three times a day (TID) | SUBCUTANEOUS | Status: DC

## 2020-05-20 MED ORDER — FONDAPARINUX SODIUM 10 MG/0.8ML ~~LOC~~ SOLN
10.0000 mg | SUBCUTANEOUS | Status: DC
  Administered 2020-05-20: 10 mg via SUBCUTANEOUS
  Filled 2020-05-20 (×2): qty 0.8

## 2020-05-20 MED ORDER — PANTOPRAZOLE SODIUM 40 MG PO TBEC
40.0000 mg | DELAYED_RELEASE_TABLET | Freq: Every day | ORAL | Status: DC
  Administered 2020-05-20 – 2020-05-24 (×5): 40 mg via ORAL
  Filled 2020-05-20 (×5): qty 1

## 2020-05-20 MED ORDER — SODIUM CHLORIDE 0.9 % IV SOLN
1.0000 mg/kg | Freq: Two times a day (BID) | INTRAVENOUS | Status: DC
  Administered 2020-05-20 – 2020-05-21 (×3): 140 mg via INTRAVENOUS
  Filled 2020-05-20 (×6): qty 1.12

## 2020-05-20 MED ORDER — SODIUM CHLORIDE 0.9 % IV SOLN: 2.0000 g | INTRAVENOUS | Status: DC

## 2020-05-20 MED ORDER — ASPIRIN EC 81 MG PO TBEC
81.0000 mg | DELAYED_RELEASE_TABLET | Freq: Every day | ORAL | Status: DC
  Administered 2020-05-20 – 2020-05-29 (×10): 81 mg via ORAL
  Filled 2020-05-20 (×10): qty 1

## 2020-05-20 MED ORDER — GUAIFENESIN-DM 100-10 MG/5ML PO SYRP
10.0000 mL | ORAL_SOLUTION | ORAL | Status: DC | PRN
  Administered 2020-05-20 – 2020-05-26 (×7): 10 mL via ORAL
  Filled 2020-05-20 (×7): qty 10

## 2020-05-20 MED ORDER — ALBUTEROL SULFATE HFA 108 (90 BASE) MCG/ACT IN AERS
2.0000 | INHALATION_SPRAY | RESPIRATORY_TRACT | Status: DC | PRN
  Administered 2020-05-26: 2 via RESPIRATORY_TRACT
  Filled 2020-05-20: qty 6.7

## 2020-05-20 MED ORDER — WARFARIN - PHYSICIAN DOSING INPATIENT: Freq: Every day | Status: DC

## 2020-05-20 MED ORDER — INSULIN ASPART 100 UNIT/ML ~~LOC~~ SOLN
5.0000 [IU] | Freq: Three times a day (TID) | SUBCUTANEOUS | Status: DC
  Administered 2020-05-20 (×3): 5 [IU] via SUBCUTANEOUS

## 2020-05-20 MED ORDER — SODIUM CHLORIDE 0.9 % IV SOLN: 200.0000 mg | Freq: Once | INTRAVENOUS | Status: DC

## 2020-05-20 MED ORDER — INSULIN ASPART 100 UNIT/ML ~~LOC~~ SOLN
0.0000 [IU] | SUBCUTANEOUS | Status: DC
  Administered 2020-05-20 (×2): 15 [IU] via SUBCUTANEOUS
  Administered 2020-05-20 (×2): 20 [IU] via SUBCUTANEOUS
  Administered 2020-05-21: 11 [IU] via SUBCUTANEOUS
  Administered 2020-05-21: 7 [IU] via SUBCUTANEOUS
  Administered 2020-05-21: 20 [IU] via SUBCUTANEOUS
  Administered 2020-05-21: 15 [IU] via SUBCUTANEOUS
  Administered 2020-05-21: 20 [IU] via SUBCUTANEOUS
  Administered 2020-05-21 – 2020-05-22 (×2): 4 [IU] via SUBCUTANEOUS
  Administered 2020-05-22: 7 [IU] via SUBCUTANEOUS

## 2020-05-20 MED ORDER — PREDNISONE 20 MG PO TABS: 50.0000 mg | ORAL_TABLET | Freq: Every day | ORAL | Status: DC

## 2020-05-20 MED ORDER — SALINE SPRAY 0.65 % NA SOLN
1.0000 | NASAL | Status: DC | PRN
  Administered 2020-05-20: 1 via NASAL
  Filled 2020-05-20: qty 44

## 2020-05-20 MED ORDER — SENNOSIDES-DOCUSATE SODIUM 8.6-50 MG PO TABS: 1.0000 | ORAL_TABLET | Freq: Every evening | ORAL | Status: DC | PRN

## 2020-05-20 MED ORDER — LOSARTAN POTASSIUM 50 MG PO TABS: 25.0000 mg | ORAL_TABLET | Freq: Every day | ORAL | Status: DC

## 2020-05-20 MED ORDER — SODIUM CHLORIDE 0.9 % IV SOLN: 100.0000 mg | Freq: Every day | INTRAVENOUS | Status: DC

## 2020-05-20 MED ORDER — INSULIN GLARGINE 100 UNIT/ML ~~LOC~~ SOLN
30.0000 [IU] | Freq: Every day | SUBCUTANEOUS | Status: DC
  Filled 2020-05-20: qty 0.3

## 2020-05-20 MED ORDER — WARFARIN SODIUM 5 MG PO TABS
10.0000 mg | ORAL_TABLET | Freq: Once | ORAL | Status: AC
  Administered 2020-05-20: 10 mg via ORAL
  Filled 2020-05-20: qty 2

## 2020-05-20 MED ORDER — INSULIN GLARGINE 100 UNIT/ML ~~LOC~~ SOLN
25.0000 [IU] | Freq: Two times a day (BID) | SUBCUTANEOUS | Status: DC
  Administered 2020-05-20: 25 [IU] via SUBCUTANEOUS
  Filled 2020-05-20 (×2): qty 0.25

## 2020-05-20 MED ORDER — BARICITINIB 2 MG PO TABS
4.0000 mg | ORAL_TABLET | Freq: Every day | ORAL | Status: DC
  Administered 2020-05-20 – 2020-05-29 (×10): 4 mg via ORAL
  Filled 2020-05-20 (×12): qty 2

## 2020-05-20 MED ORDER — ACETAMINOPHEN 325 MG PO TABS
650.0000 mg | ORAL_TABLET | Freq: Four times a day (QID) | ORAL | Status: DC | PRN
  Administered 2020-05-20 – 2020-05-26 (×4): 650 mg via ORAL
  Filled 2020-05-20 (×4): qty 2

## 2020-05-20 MED ORDER — INSULIN GLARGINE 100 UNIT/ML ~~LOC~~ SOLN
20.0000 [IU] | Freq: Once | SUBCUTANEOUS | Status: AC
  Administered 2020-05-20: 20 [IU] via SUBCUTANEOUS
  Filled 2020-05-20: qty 0.2

## 2020-05-20 MED ORDER — WARFARIN - PHARMACIST DOSING INPATIENT
Freq: Every day | Status: DC
  Administered 2020-05-23: 1

## 2020-05-20 MED ORDER — SODIUM CHLORIDE 0.9 % IV SOLN
500.0000 mg | INTRAVENOUS | Status: AC
  Administered 2020-05-20 – 2020-05-24 (×5): 500 mg via INTRAVENOUS
  Filled 2020-05-20 (×5): qty 500

## 2020-05-20 MED ORDER — INSULIN ASPART 100 UNIT/ML ~~LOC~~ SOLN
0.0000 [IU] | Freq: Every day | SUBCUTANEOUS | Status: DC
  Administered 2020-05-20: 0 [IU] via SUBCUTANEOUS

## 2020-05-20 MED ORDER — INSULIN ASPART 100 UNIT/ML ~~LOC~~ SOLN
10.0000 [IU] | Freq: Once | SUBCUTANEOUS | Status: AC
  Administered 2020-05-20: 10 [IU] via SUBCUTANEOUS

## 2020-05-20 MED ORDER — INSULIN GLARGINE 100 UNIT/ML ~~LOC~~ SOLN
40.0000 [IU] | Freq: Two times a day (BID) | SUBCUTANEOUS | Status: DC
  Administered 2020-05-20: 40 [IU] via SUBCUTANEOUS
  Filled 2020-05-20 (×4): qty 0.4

## 2020-05-20 NOTE — Plan of Care (Signed)
  Problem: Education: Goal: Knowledge of risk factors and measures for prevention of condition will improve Outcome: Progressing   Problem: Coping: Goal: Psychosocial and spiritual needs will be supported Outcome: Progressing   Problem: Respiratory: Goal: Will maintain a patent airway Outcome: Progressing Goal: Complications related to the disease process, condition or treatment will be avoided or minimized Outcome: Progressing   Problem: Education: Goal: Knowledge of General Education information will improve Description: Including pain rating scale, medication(s)/side effects and non-pharmacologic comfort measures Outcome: Progressing   Problem: Health Behavior/Discharge Planning: Goal: Ability to manage health-related needs will improve Outcome: Progressing   Problem: Clinical Measurements: Goal: Ability to maintain clinical measurements within normal limits will improve Outcome: Progressing Goal: Will remain free from infection Outcome: Progressing Goal: Respiratory complications will improve Outcome: Progressing   Problem: Activity: Goal: Risk for activity intolerance will decrease Outcome: Progressing   Problem: Coping: Goal: Level of anxiety will decrease Outcome: Progressing   Problem: Elimination: Goal: Will not experience complications related to bowel motility Outcome: Progressing   Problem: Safety: Goal: Ability to remain free from injury will improve Outcome: Progressing   Problem: Skin Integrity: Goal: Risk for impaired skin integrity will decrease Outcome: Progressing   

## 2020-05-20 NOTE — Progress Notes (Signed)
PROGRESS NOTE                                                                                                                                                                                                             Patient Demographics:    Marcus Joseph, is a 49 y.o. male, DOB - 1970/12/07, WSF:[id number]  Admit date - 05/18/2020   Admitting Physician Carlton Adam, MD  Outpatient Primary MD for the patient is [facility]  LOS - 1   Chief Complaint  Patient presents with  . Shortness of Breath       Brief Narrative    This is a no charge note as patient was seen and admitted earlier today  HPI: Marcus Olmeda. is a 49 y.o. 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  He presented to [facility] emergency room 2 days ago with complaint of shortness of breath and not feeling well.  He reportedly had some mild nausea and diarrhea over the few days previous to his presentation.  He states he has not had any fever, sore throat or change in his smell or taste.  He denies having any body aches.  He states he had his INR checked 2 weeks ago and it was 3.4 and he had his Coumadin held temporarily.  In the emergency room his INR is 1.0.  In the emergency room he was found to be Covid positive and hypoxic with O2 sat in the 70-72 range on room air.  He was placed on supplemental oxygen and started on remdesivir and given Decadron.  He was also found to have hyperglycemia with blood sugars greater than 500.  He was insulin to lower his blood sugar level.  He did have a hemoglobin A1c level done and it was over 14.  Was transferred to [facility] for further management of his Covid pneumonia with hypoxia.  He is on heated high flow oxygen at this time. He has not had the COVID-19 vaccine.  He has no known COVID-19  exposures.  He spent over 24 hours at [facility] ER awaiting a bed to become available at [facility].    Subjective:    [facility]  today he denies any chest pain, he reports dyspnea on cough, denies nausea or vomiting.     Assessment  & Plan :    Active Problems:   History of recurrent pulmonary embolism   Morbid obesity (HCC)   Long term (current) use of anticoagulants   Pneumonia due to COVID-19 virus   Diabetes mellitus with hyperglycemia (HCC)   Hypoxia  Acute hypoxic respiratory failure due to COVID-19 pneumonia -Patient is unvaccinated. -Chest x-ray significant for multifocal pneumonia bilaterally. -She is a severe case of COVID-19 pneumonia, with significant oxygen requirement, he is on 3 L heated high flow nasal cannula and 15 L NRB -Continue with IV steroids -Continue with IV remdesivir. -I have discussed with the patient, no history of tuberculosis, diverticulitis, bowel perforation or viral hepatitis infection, explained risks and benefits for baricitinib, he understands, agreeable to proceed, started on baricitinib. -He was encouraged with incentive spirometry, flutter valve, out of bed to chair, to do proning/modified proning. -Continue to trend inflammatory markers. -procalcitonin 0.42, will start on IV Rocephin and azithromycin.     SpO2: 92 % O2 Flow Rate (L/min): 35 L/min FiO2 (%): 100 %     COVID-19 Labs  Recent Labs    05/18/20 1925  DDIMER 0.67*  FERRITIN 1,356*  LDH 515*  CRP 32.6*    Lab Results  Component Value Date   SARSCOV2NAA POSITIVE (A) 05/18/2020   History of recurrent PE -He is on warfarin, with subtherapeutic INR on presentation, report he has been compliant with his warfarin. -Elevated D-dimers, need to bridge till his INR is therapeutic, for now transition from heparin GTT to Arixtra full anticoagulation dose(given history of allergy to Lovenox).  Morbid obesity (HCC)  - Long term (current) use of  anticoagulants  Diabetes mellitus -Well-controlled, A1c of 8.4 -Start on Tradjenta, increase Lantus to 40 units twice daily, will add resistant sliding scale every 4 hours, and will start on premeal NovoLog 6 units.  Hypertension -Soft blood pressure, hold Cozaar  Code Status : Full  Disposition Plan  :  Status is: Inpatient  Remains inpatient appropriate because:IV treatments appropriate due to intensity of illness or inability to take PO   Dispo: The patient is from: Home              Anticipated d/c is to: Home              Anticipated d/c date is: > 3 days              Patient currently is not medically stable to d/c.     Consults  :  None  Procedures  : none  DVT Prophylaxis  :  Heparin >>Arixtra(till INR is therapeutic).  Lab Results  Component Value Date   PLT 225 05/20/2020    Antibiotics  :    Anti-infectives (From admission, onward)   Start     Dose/Rate Route Frequency Ordered Stop   05/21/20 1230  cefTRIAXone (ROCEPHIN) 2 g in sodium chloride 0.9 % 100 mL IVPB  Status:  Discontinued        2 g 200 mL/hr over 30 Minutes Intravenous Every 24 hours 05/20/20 1346 05/20/20 1349   05/21/20 1000  remdesivir 100 mg in sodium chloride 0.9 % 100 mL IVPB  Status:  Discontinued       "Followed by" Linked Group Details   100 mg 200 mL/hr over 30 Minutes Intravenous Daily 05/20/20 0139 05/20/20 0142   05/20/20 1349  cefTRIAXone (ROCEPHIN) 2 g in  sodium chloride 0.9 % 100 mL IVPB        2 g 200 mL/hr over 30 Minutes Intravenous Every 24 hours 05/20/20 1349     05/20/20 1300  azithromycin (ZITHROMAX) 500 mg in sodium chloride 0.9 % 250 mL IVPB        500 mg 250 mL/hr over 60 Minutes Intravenous Every 24 hours 05/20/20 1155     05/20/20 1230  cefTRIAXone (ROCEPHIN) 1 g in sodium chloride 0.9 % 100 mL IVPB  Status:  Discontinued        1 g 200 mL/hr over 30 Minutes Intravenous Every 24 hours 05/20/20 1155 05/20/20 1346   05/20/20 0139  remdesivir 200 mg in sodium  chloride 0.9% 250 mL IVPB  Status:  Discontinued       "Followed by" Linked Group Details   200 mg 580 mL/hr over 30 Minutes Intravenous Once 05/20/20 0139 05/20/20 0142   05/19/20 1000  remdesivir 100 mg in sodium chloride 0.9 % 100 mL IVPB       "Followed by" Linked Group Details   100 mg 200 mL/hr over 30 Minutes Intravenous Daily 05/18/20 2059 05/23/20 0959   05/18/20 2130  remdesivir 100 mg in sodium chloride 0.9 % 100 mL IVPB       "Followed by" Linked Group Details   100 mg 200 mL/hr over 30 Minutes Intravenous  Once 05/18/20 2059 05/18/20 2242   05/18/20 2100  remdesivir 100 mg in sodium chloride 0.9 % 100 mL IVPB       "Followed by" Linked Group Details   100 mg 200 mL/hr over 30 Minutes Intravenous  Once 05/18/20 2059 05/18/20 2205        Objective:   Vitals:   05/20/20 0733 05/20/20 0807 05/20/20 1202 05/20/20 1355  BP: 105/70  (!) 131/114   Pulse: 82  80 79  Resp: (!) 21  18 (!) 26  Temp: 98.1 F (36.7 C)  97.9 F (36.6 C)   TempSrc: Oral  Oral   SpO2: 91% 90% 92% 92%  Weight:      Height:        Wt Readings from Last 3 Encounters:  05/20/20 (!) 143 kg  01/23/20 (!) 141.5 kg  10/30/19 (!) 142.9 kg     Intake/Output Summary (Last 24 hours) at 05/20/2020 1418 Last data filed at 05/20/2020 1100 Gross per 24 hour  Intake 941.94 ml  Output 700 ml  Net 241.94 ml     Physical Exam  Awake Alert, Oriented X 3, No new F.N deficits, Normal affect Symmetrical Chest wall movement, Good air movement bilaterally, CTAB RRR,No Gallops,Rubs or new Murmurs, No Parasternal Heave +ve B.Sounds, Abd Soft, No tenderness, No rebound - guarding or rigidity. No Cyanosis, Clubbing or edema, No new Rash or bruise      Data Review:    CBC Recent Labs  Lab 05/18/20 1925 05/20/20 0853  WBC 5.7 11.9*  HGB 14.4 14.6  HCT 44.0 44.7  PLT 173 225  MCV 83.5 83.1  MCH 27.3 27.1  MCHC 32.7 32.7  RDW 13.8 13.8  LYMPHSABS 0.8  --   MONOABS 0.4  --   EOSABS 0.0  --     BASOSABS 0.0  --     Chemistries  Recent Labs  Lab 05/18/20 1925  NA 127*  K 5.1  CL 91*  CO2 21*  GLUCOSE 553*  BUN 19  CREATININE 1.11  CALCIUM 8.4*  AST 96*  ALT 68*  ALKPHOS 60  BILITOT 1.0   ------------------------------------------------------------------------------------------------------------------ Recent Labs    05/18/20 1925  TRIG 271*    Lab Results  Component Value Date   HGBA1C 14.4 (H) 05/19/2020   ------------------------------------------------------------------------------------------------------------------ No results for input(s): TSH, T4TOTAL, T3FREE, THYROIDAB in the last 72 hours.  Invalid input(s): FREET3 ------------------------------------------------------------------------------------------------------------------ Recent Labs    05/18/20 1925  FERRITIN 1,356*    Coagulation profile Recent Labs  Lab 05/18/20 1925 05/19/20 0807  INR 1.0 1.0    Recent Labs    05/18/20 1925  DDIMER 0.67*    Cardiac Enzymes No results for input(s): CKMB, TROPONINI, MYOGLOBIN in the last 168 hours.  Invalid input(s): CK ------------------------------------------------------------------------------------------------------------------    Component Value Date/Time   BNP 14.5 06/07/2015 1455    Inpatient Medications  Scheduled Meds: . aspirin EC  81 mg Oral Daily  . baricitinib  4 mg Oral Daily  . insulin aspart  0-20 Units Subcutaneous Q4H  . insulin aspart  5 Units Subcutaneous TID WC  . insulin glargine  40 Units Subcutaneous BID  . linagliptin  5 mg Oral Daily  . pantoprazole  40 mg Oral Daily  . [START ON 05/23/2020] predniSONE  50 mg Oral Daily  . warfarin  5 mg Oral Daily  . Warfarin - Physician Dosing Inpatient   Does not apply q1600   Continuous Infusions: . azithromycin 500 mg (05/20/20 1400)  . cefTRIAXone (ROCEPHIN)  IV    . heparin 1,500 Units/hr (05/20/20 0654)  . methylPREDNISolone (SOLU-MEDROL) injection Stopped  (05/20/20 0447)  . remdesivir 100 mg in NS 100 mL 100 mg (05/20/20 0931)   PRN Meds:.acetaminophen, albuterol, guaiFENesin-dextromethorphan, senna-docusate  Micro Results Recent Results (from the past 240 hour(s))  SARS Coronavirus 2 by RT PCR (hospital order, performed in [facility] hospital lab) Nasopharyngeal Nasopharyngeal Swab     Status: Abnormal   Collection Time: 05/18/20  6:51 PM   Specimen: Nasopharyngeal Swab  Result Value Ref Range Status   SARS Coronavirus 2 POSITIVE (A) NEGATIVE Final    Comment: RESULT CALLED TO, READ BACK BY AND VERIFIED WITH: CALLED TO K.NEAL RN AT 1938 BY SROY ON [id number] (NOTE) SARS-CoV-2 target nucleic acids are DETECTED  SARS-CoV-2 RNA is generally detectable in upper respiratory specimens  during the acute phase of infection.  Positive results are indicative  of the presence of the identified virus, but do not rule out bacterial infection or co-infection with other pathogens not detected by the test.  Clinical correlation with patient history and  other diagnostic information is necessary to determine patient infection status.  The expected result is negative.  Fact Sheet for Patients:   BoilerBrush.com.cy   Fact Sheet for Healthcare Providers:   https://pope.com/    This test is not yet approved or cleared by the [place] FDA and  has been authorized for detection and/or diagnosis of SARS-CoV-2 by FDA under an Emergency Use Authorization (EUA).  This EUA will remain in effect (mean ing this test can be used) for the duration of  the COVID-19 declaration under Section 564(b)(1) of the Act, 21 U.S.C. section 360-bbb-3(b)(1), unless the authorization is terminated or revoked sooner.  Performed at [facility], [address] [place], [place] [address]   Blood Culture (routine x 2)     Status: None (Preliminary result)   Collection Time: 05/18/20  7:26 PM   Specimen: BLOOD    Result Value Ref Range Status   Specimen Description   Final    BLOOD LEFT ANTECUBITAL Performed at  [facility], [address], [place], [place] [address]    Special Requests   Final    BOTTLES DRAWN AEROBIC AND ANAEROBIC Blood Culture adequate volume Performed at [facility], [address] [place], [place] [address]    Culture   Final    NO GROWTH 2 DAYS Performed at [facility], [address] [place], [place] [address]    Report Status PENDING  Incomplete  Blood Culture (routine x 2)     Status: None (Preliminary result)   Collection Time: 05/18/20  9:39 PM   Specimen: BLOOD LEFT WRIST  Result Value Ref Range Status   Specimen Description   Final    BLOOD LEFT WRIST Performed at [facility], [address] [place], [place] [address]    Special Requests   Final    BOTTLES DRAWN AEROBIC AND ANAEROBIC Blood Culture adequate volume Performed at [facility], [address], [place], [place] [address]    Culture   Final    NO GROWTH 1 DAY Performed at [facility], [address] [place], [place] [address]    Report Status PENDING  Incomplete    Radiology Reports DG Chest Portable 1 View  Result Date: 05/18/2020 CLINICAL DATA:  Shortness of breath, rule out COVID. EXAM: PORTABLE CHEST 1 VIEW COMPARISON:  Chest x-ray 09/27/2018. FINDINGS: Sternotomy wires are again noted. The heart size and mediastinal contours are within normal limits. Diffuse hazy airspace opacities that are more prominent in the peripheral left lung. No pulmonary edema. No pleural effusion. No pneumothorax. No acute osseous abnormality. IMPRESSION: Multifocal pneumonia likely representing known COVID-19 infection Electronically Signed   By: Tish Frederickson M.D.   On: 05/18/2020 20:17     Huey Bienenstock M.D on 05/20/2020 at 2:18 PM    [facility] -  Office  [phone]

## 2020-05-20 NOTE — Progress Notes (Addendum)
ANTICOAGULATION CONSULT NOTE - Initial Consult  Pharmacy Consult for heparin>>Arixtra Indication: Hx DVT/PE  Allergies  Allergen Reactions  . Contrast Media [Iodinated Diagnostic Agents] Itching    Pt states he thinks it makes him itch  . Lovenox [Enoxaparin Sodium] Itching    Itching over entire body  . Percocet [Oxycodone-Acetaminophen] Hives and Itching    Patient Measurements: Height: 5\' 10"  (177.8 cm) Weight: (!) 143 kg (315 lb 4.1 oz) IBW/kg (Calculated) : 73 Heparin Dosing Weight: 107 kg   Vital Signs: Temp: 97.9 F (36.6 C) (09/22 1202) Temp Source: Oral (09/22 1202) BP: 131/114 (09/22 1202) Pulse Rate: 80 (09/22 1202)  Labs: Recent Labs    05/18/20 1925 05/19/20 0807 05/19/20 2210 05/20/20 0853  HGB 14.4  --   --  14.6  HCT 44.0  --   --  44.7  PLT 173  --   --  225  LABPROT 12.5 12.7  --   --   INR 1.0 1.0  --   --   HEPARINUNFRC  --   --  0.54 0.42  CREATININE 1.11  --   --   --     Estimated Creatinine Clearance: 116.3 mL/min (by C-G formula based on SCr of 1.11 mg/dL).   Medical History: Past Medical History:  Diagnosis Date  . Anemia age 54 or 7   none since  . Arthritis    oa  . Blood dyscrasia   . Diabetes mellitus without complication (HCC)    newly diagnosed  . DVT (deep venous thrombosis) (HCC)   . GERD (gastroesophageal reflux disease)   . Head problem    back of top of head area swells at times, had glass in wound at times, pt instrcuted to follow up with md, if any pus seen  . Headache(784.0)    migraine  . Morbid obesity (HCC)   . PE (pulmonary embolism) 10/01/2014  . Prediabetes on 04-28-14  . Sciatica     Medications:  Infusions:  . azithromycin    . cefTRIAXone (ROCEPHIN)  IV    . heparin 1,500 Units/hr (05/20/20 0654)  . methylPREDNISolone (SOLU-MEDROL) injection Stopped (05/20/20 0447)  . remdesivir 100 mg in NS 100 mL 100 mg (05/20/20 0931)    Assessment:  Patient with history of recurrent clots, on long-term  warfarin - current INR = 1.0 (down from 3.4 eight days ago); patient also has itching allergy to lovenox but has received heparin at [facility] facilities in the recent past.  Heparin level came back therapeutic. Plan is to convert pt to NOAC if possible. Coumadin is not per pharmacy at this point. INR was not done today but likely not close to therapeutic since it was 1 yesterday.    Home coumadin 10mg  PO qday except 5mg  MWF  Addendum:  Ok to do Arixtra for bridging per [facility]. [facility] since he is has a Lovenox "allergy". Rx will dose coumadin  Goal of Therapy:  Heparin level 0.3-0.7 units/ml  INR 2-3 Monitor platelets by anticoagulation protocol: Yes   Plan:  Dc heparin Arixtra 10mg  SQ q24 Coumadin 10mg  PO x1 Daily CBC/INR  , PharmD, [place], AAHIVP, CPP Infectious Disease Pharmacist 05/20/2020 1:59 PM
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
## 2020-05-20 NOTE — Plan of Care (Signed)
  Problem: Education: Goal: Knowledge of risk factors and measures for prevention of condition will improve Outcome: Progressing   Problem: Respiratory: Goal: Will maintain a patent airway Outcome: Progressing   Problem: Coping: Goal: Psychosocial and spiritual needs will be supported Outcome: Progressing   Problem: Activity: Goal: Risk for activity intolerance will decrease Outcome: Progressing   Problem: Clinical Measurements: Goal: Respiratory complications will improve Outcome: Progressing   Problem: Skin Integrity: Goal: Risk for impaired skin integrity will decrease Outcome: Progressing   Problem: Safety: Goal: Ability to remain free from injury will improve Outcome: Progressing

## 2020-05-20 NOTE — Progress Notes (Signed)
   05/20/20 0034  Provider Notification  Provider Name/Title Dr Rachael Darby  Date Provider Notified 05/20/20  Time Provider Notified [phone]  Notification Type Page  Notification Reason Other (Comment) (BG-325,headache,cough,sinus burning)  Response Other (Comment) (came to bedside, order received to give 8 u of reg insulin)  Date of Provider Response 05/20/20  Time of Provider Response 0040

## 2020-05-21 DIAGNOSIS — J9600 Acute respiratory failure, unspecified whether with hypoxia or hypercapnia: Secondary | ICD-10-CM

## 2020-05-21 LAB — CBC WITH DIFFERENTIAL/PLATELET
Abs Immature Granulocytes: 0.14 10*3/uL — ABNORMAL HIGH (ref 0.00–0.07)
Basophils Absolute: 0.1 10*3/uL (ref 0.0–0.1)
Basophils Relative: 1 %
Eosinophils Absolute: 0.1 10*3/uL (ref 0.0–0.5)
Eosinophils Relative: 1 %
HCT: 44.3 % (ref 39.0–52.0)
Hemoglobin: 14.9 g/dL (ref 13.0–17.0)
Immature Granulocytes: 1 %
Lymphocytes Relative: 10 %
Lymphs Abs: 1.3 10*3/uL (ref 0.7–4.0)
MCH: 27.8 pg (ref 26.0–34.0)
MCHC: 33.6 g/dL (ref 30.0–36.0)
MCV: 82.6 fL (ref 80.0–100.0)
Monocytes Absolute: 0.7 10*3/uL (ref 0.1–1.0)
Monocytes Relative: 5 %
Neutro Abs: 11.5 10*3/uL — ABNORMAL HIGH (ref 1.7–7.7)
Neutrophils Relative %: 82 %
Platelets: 271 10*3/uL (ref 150–400)
RBC: 5.36 MIL/uL (ref 4.22–5.81)
RDW: 13.9 % (ref 11.5–15.5)
WBC Morphology: INCREASED
WBC: 13.9 10*3/uL — ABNORMAL HIGH (ref 4.0–10.5)
nRBC: 0 % (ref 0.0–0.2)

## 2020-05-21 LAB — COMPREHENSIVE METABOLIC PANEL
ALT: 38 U/L (ref 0–44)
AST: 54 U/L — ABNORMAL HIGH (ref 15–41)
Albumin: 2.3 g/dL — ABNORMAL LOW (ref 3.5–5.0)
Alkaline Phosphatase: 63 U/L (ref 38–126)
Anion gap: 15 (ref 5–15)
BUN: 27 mg/dL — ABNORMAL HIGH (ref 6–20)
CO2: 21 mmol/L — ABNORMAL LOW (ref 22–32)
Calcium: 8 mg/dL — ABNORMAL LOW (ref 8.9–10.3)
Chloride: 97 mmol/L — ABNORMAL LOW (ref 98–111)
Creatinine, Ser: 0.97 mg/dL (ref 0.61–1.24)
GFR calc Af Amer: >60 mL/min (ref >60)
GFR calc non Af Amer: >60 mL/min (ref >60)
Glucose, Bld: 182 mg/dL — ABNORMAL HIGH (ref 70–99)
Potassium: 4.7 mmol/L (ref 3.5–5.1)
Sodium: 133 mmol/L — ABNORMAL LOW (ref 135–145)
Total Bilirubin: 0.7 mg/dL (ref 0.3–1.2)
Total Protein: 6.4 g/dL — ABNORMAL LOW (ref 6.5–8.1)

## 2020-05-21 LAB — D-DIMER, QUANTITATIVE: D-Dimer, Quant: 0.93 ug/mL-FEU — ABNORMAL HIGH (ref 0.00–0.50)

## 2020-05-21 LAB — GLUCOSE, CAPILLARY
Glucose-Capillary: 232 mg/dL — ABNORMAL HIGH (ref 70–99)
Glucose-Capillary: 182 mg/dL — ABNORMAL HIGH (ref 70–99)
Glucose-Capillary: 299 mg/dL — ABNORMAL HIGH (ref 70–99)
Glucose-Capillary: 360 mg/dL — ABNORMAL HIGH (ref 70–99)
Glucose-Capillary: 441 mg/dL — ABNORMAL HIGH (ref 70–99)

## 2020-05-21 LAB — PROTIME-INR
INR: 1.3 — ABNORMAL HIGH (ref 0.8–1.2)
Prothrombin Time: 15.9 seconds — ABNORMAL HIGH (ref 11.4–15.2)

## 2020-05-21 MED ORDER — INSULIN ASPART 100 UNIT/ML ~~LOC~~ SOLN
8.0000 [IU] | Freq: Three times a day (TID) | SUBCUTANEOUS | Status: DC
  Administered 2020-05-21 – 2020-05-23 (×8): 8 [IU] via SUBCUTANEOUS

## 2020-05-21 MED ORDER — INSULIN GLARGINE 100 UNIT/ML ~~LOC~~ SOLN
45.0000 [IU] | Freq: Two times a day (BID) | SUBCUTANEOUS | Status: DC
  Administered 2020-05-21: 40 [IU] via SUBCUTANEOUS
  Filled 2020-05-21 (×2): qty 0.45

## 2020-05-21 MED ORDER — WARFARIN SODIUM 5 MG PO TABS
10.0000 mg | ORAL_TABLET | Freq: Once | ORAL | Status: AC
  Administered 2020-05-21: 10 mg via ORAL
  Filled 2020-05-21: qty 2

## 2020-05-21 MED ORDER — INSULIN GLARGINE 100 UNIT/ML ~~LOC~~ SOLN
40.0000 [IU] | Freq: Two times a day (BID) | SUBCUTANEOUS | Status: DC
  Administered 2020-05-21: 40 [IU] via SUBCUTANEOUS
  Filled 2020-05-21 (×3): qty 0.4

## 2020-05-21 MED ORDER — FONDAPARINUX SODIUM 10 MG/0.8ML ~~LOC~~ SOLN
10.0000 mg | SUBCUTANEOUS | Status: DC
  Administered 2020-05-21 – 2020-05-22 (×2): 10 mg via SUBCUTANEOUS
  Filled 2020-05-21 (×3): qty 0.8

## 2020-05-21 MED ORDER — SODIUM CHLORIDE 0.9 % IV SOLN
2.0000 g | INTRAVENOUS | Status: AC
  Administered 2020-05-21 – 2020-05-24 (×4): 2 g via INTRAVENOUS
  Filled 2020-05-21 (×4): qty 20

## 2020-05-21 MED ORDER — METHYLPREDNISOLONE SODIUM SUCC 125 MG IJ SOLR
60.0000 mg | Freq: Four times a day (QID) | INTRAMUSCULAR | Status: DC
  Administered 2020-05-21 – 2020-05-24 (×13): 60 mg via INTRAVENOUS
  Filled 2020-05-21 (×13): qty 2

## 2020-05-21 NOTE — Progress Notes (Signed)
PROGRESS NOTE                                                                                                                                                                                                             Patient Demographics:    Marcus Joseph, is a 49 y.o. male, DOB - 10-15-1970, ZOX:[id number]  Admit date - 05/18/2020   Admitting Physician Carlton Adam, MD  Outpatient Primary MD for the patient is [facility]  LOS - 2   Chief Complaint  Patient presents with  . Shortness of Breath       Brief Narrative     HPI: Marcus Strite. is a 49 y.o. 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  He presented to [facility] emergency room 2 days ago with complaint of shortness of breath and not feeling well.  He reportedly had some mild nausea and diarrhea over the few days previous to his presentation.  He states he has not had any fever, sore throat or change in his smell or taste.  He denies having any body aches.  He states he had his INR checked 2 weeks ago and it was 3.4 and he had his Coumadin held temporarily.  In the emergency room his INR is 1.0.  In the emergency room he was found to be Covid positive and hypoxic with O2 sat in the 70-72 range on room air.  He was placed on supplemental oxygen and started on remdesivir and given Decadron.  He was also found to have hyperglycemia with blood sugars greater than 500.  He was insulin to lower his blood sugar level.  He did have a hemoglobin A1c level done and it was over 14.  Was transferred to [facility] for further management of his Covid pneumonia with hypoxia.  He is on heated high flow oxygen at this time. He has not had the COVID-19 vaccine.  He has no known COVID-19 exposures.  He spent over 24 hours at [facility] ER awaiting a  bed to become available at [facility].    Subjective:    [facility] today reports some dyspnea ,worsening with  acitivity, reports cough as well.  Assessment  & Plan :    Active Problems:   History of recurrent pulmonary embolism   Morbid obesity (HCC)   Long term (current) use of anticoagulants   Pneumonia due to COVID-19 virus   Diabetes mellitus with hyperglycemia (HCC)   Hypoxia  Acute hypoxic respiratory failure due to COVID-19 pneumonia -Patient is unvaccinated. -Chest x-ray significant for multifocal pneumonia bilaterally. -She is a severe case of COVID-19 pneumonia, with significant oxygen requirement,he is on 30 L HFNC, he is on intermittently 15L NRB as well. -Continue with IV steroids -Continue with IV remdesivir. -started on Baricitinib. -Continue to trend inflammatory markers. -procalcitonin 0.42, Started on IV Rocephin and azithromycin.     SpO2: 98 % O2 Flow Rate (L/min): 35 L/min FiO2 (%): 100 %     COVID-19 Labs  Recent Labs    05/18/20 1925 05/21/20 0659  DDIMER 0.67* 0.93*  FERRITIN 1,356*  --   LDH 515*  --   CRP 32.6*  --     Lab Results  Component Value Date   SARSCOV2NAA POSITIVE (A) 05/18/2020   History of recurrent PE -He is on warfarin, with subtherapeutic INR on presentation, report he has been compliant with his warfarin, reports he failed Xarelto as outpatient in the past. -Elevated D-dimers, need to bridge till his INR is therapeutic, transitioned from heparin GTT to Arixtra full anticoagulation dose(given history of allergy to Lovenox).  Morbid obesity (HCC)  - Long term (current) use of anticoagulants  Diabetes mellitus -Well-controlled, A1c of 14.4 -Started on Tradjenta. - continue with lantus 40 bid, will change sliding scale to [facility], continue with pre meal novolg  Hypertension -Soft blood pressure, hold Cozaar  Code Status : Full  Family communication: patient is appropriate, coherent, he was  updated in detail, all his questions answered.  Disposition Plan  :  Status is: Inpatient  Remains inpatient appropriate because:IV treatments appropriate due to intensity of illness or inability to take PO   Dispo: The patient is from: Home              Anticipated d/c is to: Home              Anticipated d/c date is: > 3 days              Patient currently is not medically stable to d/c.    Consults  :  None  Procedures  : none  DVT Prophylaxis  :  Heparin >>Arixtra(till INR is therapeutic).  Lab Results  Component Value Date   PLT 271 05/21/2020    Antibiotics  :    Anti-infectives (From admission, onward)   Start     Dose/Rate Route Frequency Ordered Stop   05/21/20 1300  cefTRIAXone (ROCEPHIN) 2 g in sodium chloride 0.9 % 100 mL IVPB        2 g 200 mL/hr over 30 Minutes Intravenous Every 24 hours 05/21/20 0901 05/25/20 1259   05/21/20 1230  cefTRIAXone (ROCEPHIN) 2 g in sodium chloride 0.9 % 100 mL IVPB  Status:  Discontinued        2 g 200 mL/hr over 30 Minutes Intravenous Every 24 hours 05/20/20 1346 05/20/20 1349   05/21/20 1000  remdesivir 100 mg in sodium chloride 0.9 % 100 mL IVPB  Status:  Discontinued       "Followed by" Linked Group Details   100 mg 200 mL/hr over 30 Minutes Intravenous Daily 05/20/20 0139 05/20/20 0142   05/20/20 1349  cefTRIAXone (ROCEPHIN) 2 g  in sodium chloride 0.9 % 100 mL IVPB  Status:  Discontinued        2 g 200 mL/hr over 30 Minutes Intravenous Every 24 hours 05/20/20 1349 05/21/20 0901   05/20/20 1300  azithromycin (ZITHROMAX) 500 mg in sodium chloride 0.9 % 250 mL IVPB        500 mg 250 mL/hr over 60 Minutes Intravenous Every 24 hours 05/20/20 1155 05/24/20 2359   05/20/20 1230  cefTRIAXone (ROCEPHIN) 1 g in sodium chloride 0.9 % 100 mL IVPB  Status:  Discontinued        1 g 200 mL/hr over 30 Minutes Intravenous Every 24 hours 05/20/20 1155 05/20/20 1346   05/20/20 0139  remdesivir 200 mg in sodium chloride 0.9% 250 mL IVPB   Status:  Discontinued       "Followed by" Linked Group Details   200 mg 580 mL/hr over 30 Minutes Intravenous Once 05/20/20 0139 05/20/20 0142   05/19/20 1000  remdesivir 100 mg in sodium chloride 0.9 % 100 mL IVPB       "Followed by" Linked Group Details   100 mg 200 mL/hr over 30 Minutes Intravenous Daily 05/18/20 2059 05/23/20 0959   05/18/20 2130  remdesivir 100 mg in sodium chloride 0.9 % 100 mL IVPB       "Followed by" Linked Group Details   100 mg 200 mL/hr over 30 Minutes Intravenous  Once 05/18/20 2059 05/18/20 2242   05/18/20 2100  remdesivir 100 mg in sodium chloride 0.9 % 100 mL IVPB       "Followed by" Linked Group Details   100 mg 200 mL/hr over 30 Minutes Intravenous  Once 05/18/20 2059 05/18/20 2205        Objective:   Vitals:   05/21/20 0747 05/21/20 0856 05/21/20 1142 05/21/20 1200  BP: 100/64  100/69   Pulse: 65 70 60   Resp: 20  19   Temp: 98.2 F (36.8 C)  98.4 F (36.9 C)   TempSrc: Oral  Oral   SpO2: 91%  93% 98%  Weight:      Height:        Wt Readings from Last 3 Encounters:  05/20/20 (!) 143 kg  01/23/20 (!) 141.5 kg  10/30/19 (!) 142.9 kg     Intake/Output Summary (Last 24 hours) at 05/21/2020 1409 Last data filed at 05/21/2020 0455 Gross per 24 hour  Intake 1355.28 ml  Output 1025 ml  Net 330.28 ml     Physical Exam  Awake Alert, Oriented X 3, No new F.N deficits, Normal affect Symmetrical Chest wall movement, Good air movement bilaterally, CTAB RRR,No Gallops,Rubs or new Murmurs, No Parasternal Heave +ve B.Sounds, Abd Soft, No tenderness, No rebound - guarding or rigidity. No Cyanosis, Clubbing or edema, No new Rash or bruise      Data Review:    CBC Recent Labs  Lab 05/18/20 1925 05/20/20 0853 05/21/20 0659  WBC 5.7 11.9* 13.9*  HGB 14.4 14.6 14.9  HCT 44.0 44.7 44.3  PLT 173 225 271  MCV 83.5 83.1 82.6  MCH 27.3 27.1 27.8  MCHC 32.7 32.7 33.6  RDW 13.8 13.8 13.9  LYMPHSABS 0.8  --  1.3  MONOABS 0.4  --  0.7    EOSABS 0.0  --  0.1  BASOSABS 0.0  --  0.1    Chemistries  Recent Labs  Lab 05/18/20 1925 05/21/20 0659  NA 127* 133*  K 5.1 4.7  CL 91* 97*  CO2 21* 21*  GLUCOSE 553* 182*  BUN 19 27*  CREATININE 1.11 0.97  CALCIUM 8.4* 8.0*  AST 96* 54*  ALT 68* 38  ALKPHOS 60 63  BILITOT 1.0 0.7   ------------------------------------------------------------------------------------------------------------------ Recent Labs    05/18/20 1925  TRIG 271*    Lab Results  Component Value Date   HGBA1C 14.4 (H) 05/19/2020   ------------------------------------------------------------------------------------------------------------------ No results for input(s): TSH, T4TOTAL, T3FREE, THYROIDAB in the last 72 hours.  Invalid input(s): FREET3 ------------------------------------------------------------------------------------------------------------------ Recent Labs    05/18/20 1925  FERRITIN 1,356*    Coagulation profile Recent Labs  Lab 05/18/20 1925 05/19/20 0807 05/21/20 0659  INR 1.0 1.0 1.3*    Recent Labs    05/18/20 1925 05/21/20 0659  DDIMER 0.67* 0.93*    Cardiac Enzymes No results for input(s): CKMB, TROPONINI, MYOGLOBIN in the last 168 hours.  Invalid input(s): CK ------------------------------------------------------------------------------------------------------------------    Component Value Date/Time   BNP 14.5 06/07/2015 1455    Inpatient Medications  Scheduled Meds: . aspirin EC  81 mg Oral Daily  . baricitinib  4 mg Oral Daily  . fondaparinux (ARIXTRA) injection  10 mg Subcutaneous Q24H  . insulin aspart  0-20 Units Subcutaneous Q4H  . insulin aspart  8 Units Subcutaneous TID WC  . insulin glargine  45 Units Subcutaneous BID  . linagliptin  5 mg Oral Daily  . pantoprazole  40 mg Oral Daily  . [START ON 05/23/2020] predniSONE  50 mg Oral Daily  . warfarin  10 mg Oral ONCE-1600  . Warfarin - Pharmacist Dosing Inpatient   Does not apply  q1600   Continuous Infusions: . azithromycin 500 mg (05/21/20 1225)  . cefTRIAXone (ROCEPHIN)  IV 2 g (05/21/20 1230)  . methylPREDNISolone (SOLU-MEDROL) injection 140 mg (05/21/20 0455)  . remdesivir 100 mg in NS 100 mL 100 mg (05/21/20 0928)   PRN Meds:.acetaminophen, albuterol, guaiFENesin-dextromethorphan, senna-docusate, sodium chloride  Micro Results Recent Results (from the past 240 hour(s))  SARS Coronavirus 2 by RT PCR (hospital order, performed in [facility] hospital lab) Nasopharyngeal Nasopharyngeal Swab     Status: Abnormal   Collection Time: 05/18/20  6:51 PM   Specimen: Nasopharyngeal Swab  Result Value Ref Range Status   SARS Coronavirus 2 POSITIVE (A) NEGATIVE Final    Comment: RESULT CALLED TO, READ BACK BY AND VERIFIED WITH: CALLED TO K.NEAL RN AT 1938 BY SROY ON [id number] (NOTE) SARS-CoV-2 target nucleic acids are DETECTED  SARS-CoV-2 RNA is generally detectable in upper respiratory specimens  during the acute phase of infection.  Positive results are indicative  of the presence of the identified virus, but do not rule out bacterial infection or co-infection with other pathogens not detected by the test.  Clinical correlation with patient history and  other diagnostic information is necessary to determine patient infection status.  The expected result is negative.  Fact Sheet for Patients:   BoilerBrush.com.cy   Fact Sheet for Healthcare Providers:   https://pope.com/    This test is not yet approved or cleared by the [place] FDA and  has been authorized for detection and/or diagnosis of SARS-CoV-2 by FDA under an Emergency Use Authorization (EUA).  This EUA will remain in effect (mean ing this test can be used) for the duration of  the COVID-19 declaration under Section 564(b)(1) of the Act, 21 U.S.C. section 360-bbb-3(b)(1), unless the authorization is terminated or revoked sooner.  Performed at [facility], [address] [place], [place] [address]   Blood Culture (routine x  2)     Status: None (Preliminary result)   Collection Time: 05/18/20  7:26 PM   Specimen: BLOOD  Result Value Ref Range Status   Specimen Description   Final    BLOOD LEFT ANTECUBITAL Performed at [facility], [address] [place], [place] [address]    Special Requests   Final    BOTTLES DRAWN AEROBIC AND ANAEROBIC Blood Culture adequate volume Performed at [facility], [address] [place], [place] [address]    Culture   Final    NO GROWTH 3 DAYS Performed at [facility], [address], [place], [place] [address]    Report Status PENDING  Incomplete  Blood Culture (routine x 2)     Status: None (Preliminary result)   Collection Time: 05/18/20  9:39 PM   Specimen: BLOOD LEFT WRIST  Result Value Ref Range Status   Specimen Description   Final    BLOOD LEFT WRIST Performed at [facility], [address] [place], [place] [address]    Special Requests   Final    BOTTLES DRAWN AEROBIC AND ANAEROBIC Blood Culture adequate volume Performed at [facility], [address] [place], [place] [address]    Culture   Final    NO GROWTH 2 DAYS Performed at [facility], [address], [place], [place] [address]    Report Status PENDING  Incomplete    Radiology Reports DG Chest Portable 1 View  Result Date: 05/18/2020 CLINICAL DATA:  Shortness of breath, rule out COVID. EXAM: PORTABLE CHEST 1 VIEW COMPARISON:  Chest x-ray 09/27/2018. FINDINGS: Sternotomy wires are again noted. The heart size and mediastinal contours are within normal limits. Diffuse hazy airspace opacities that are more prominent in the peripheral left lung. No pulmonary edema. No pleural effusion. No pneumothorax. No acute osseous abnormality. IMPRESSION: Multifocal pneumonia likely representing known COVID-19 infection Electronically Signed   By: Tish Frederickson M.D.    On: 05/18/2020 20:17     Huey Bienenstock M.D on 05/21/2020 at 2:09 PM    [facility] -  Office  [phone]

## 2020-05-21 NOTE — Progress Notes (Signed)
ANTICOAGULATION CONSULT NOTE  Pharmacy Consult for heparin>>Arixtra Indication: Hx DVT/PE  Allergies  Allergen Reactions  . Contrast Media [Iodinated Diagnostic Agents] Itching    Pt states he thinks it makes him itch  . Lovenox [Enoxaparin Sodium] Itching    Itching over entire body  . Percocet [Oxycodone-Acetaminophen] Hives and Itching    Patient Measurements: Height: 5\' 10"  (177.8 cm) Weight: (!) 143 kg (315 lb 4.1 oz) IBW/kg (Calculated) : 73 Heparin Dosing Weight: 107 kg   Vital Signs: Temp: 98.2 F (36.8 C) (09/23 0747) Temp Source: Oral (09/23 0747) BP: 100/64 (09/23 0747) Pulse Rate: 65 (09/23 0747)  Labs: Recent Labs    05/18/20 1925 05/18/20 1925 05/19/20 0807 05/19/20 2210 05/20/20 0853 05/21/20 0659  HGB 14.4   < >  --   --  14.6 14.9  HCT 44.0  --   --   --  44.7 44.3  PLT 173  --   --   --  225 271  LABPROT 12.5  --  12.7  --   --  15.9*  INR 1.0  --  1.0  --   --  1.3*  HEPARINUNFRC  --   --   --  0.54 0.42  --   CREATININE 1.11  --   --   --   --  0.97   < > = values in this interval not displayed.    Estimated Creatinine Clearance: 133 mL/min (by C-G formula based on SCr of 0.97 mg/dL).    Assessment: Patient with history of recurrent clots, on long-term warfarin - admission INR = 1.0 (down from 3.4 eight days ago); patient also has itching allergy to lovenox but has received heparin at [facility] facilities in the recent past.   Continues on Arixtra for bridge and warfarin INR today 1.3 (trending up) No bleeding noted  Goal of Therapy:  INR 2-3 Monitor platelets by anticoagulation protocol: Yes   Plan:  Continue Arixtra 10mg  SQ q24 Repeat Coumadin 10mg  PO x1 Daily CBC/INR  Thank you [facility], PharmD [phone] 05/21/2020 8:48 AM

## 2020-05-21 NOTE — Plan of Care (Signed)
  Problem: Respiratory: Goal: Will maintain a patent airway Outcome: Progressing Goal: Complications related to the disease process, condition or treatment will be avoided or minimized Outcome: Progressing   Problem: Health Behavior/Discharge Planning: Goal: Ability to manage health-related needs will improve Outcome: Progressing   Problem: Clinical Measurements: Goal: Will remain free from infection Outcome: Progressing

## 2020-05-22 LAB — PROTIME-INR
INR: 1.9 — ABNORMAL HIGH (ref 0.8–1.2)
Prothrombin Time: 20.8 seconds — ABNORMAL HIGH (ref 11.4–15.2)

## 2020-05-22 LAB — CBC WITH DIFFERENTIAL/PLATELET
Abs Immature Granulocytes: 0.2 10*3/uL — ABNORMAL HIGH (ref 0.00–0.07)
Basophils Absolute: 0.1 10*3/uL (ref 0.0–0.1)
Basophils Relative: 1 %
Eosinophils Absolute: 0 10*3/uL (ref 0.0–0.5)
Eosinophils Relative: 0 %
HCT: 47.4 % (ref 39.0–52.0)
Hemoglobin: 15.7 g/dL (ref 13.0–17.0)
Immature Granulocytes: 1 %
Lymphocytes Relative: 9 %
Lymphs Abs: 1.3 10*3/uL (ref 0.7–4.0)
MCH: 27.5 pg (ref 26.0–34.0)
MCHC: 33.1 g/dL (ref 30.0–36.0)
MCV: 83 fL (ref 80.0–100.0)
Monocytes Absolute: 0.6 10*3/uL (ref 0.1–1.0)
Monocytes Relative: 4 %
Neutro Abs: 12.3 10*3/uL — ABNORMAL HIGH (ref 1.7–7.7)
Neutrophils Relative %: 85 %
Platelets: 271 10*3/uL (ref 150–400)
RBC: 5.71 MIL/uL (ref 4.22–5.81)
RDW: 14 % (ref 11.5–15.5)
WBC: 14.5 10*3/uL — ABNORMAL HIGH (ref 4.0–10.5)
nRBC: 0 % (ref 0.0–0.2)

## 2020-05-22 LAB — GLUCOSE, CAPILLARY
Glucose-Capillary: 217 mg/dL — ABNORMAL HIGH (ref 70–99)
Glucose-Capillary: 179 mg/dL — ABNORMAL HIGH (ref 70–99)
Glucose-Capillary: 178 mg/dL — ABNORMAL HIGH (ref 70–99)
Glucose-Capillary: 209 mg/dL — ABNORMAL HIGH (ref 70–99)
Glucose-Capillary: 212 mg/dL — ABNORMAL HIGH (ref 70–99)
Glucose-Capillary: 326 mg/dL — ABNORMAL HIGH (ref 70–99)

## 2020-05-22 LAB — COMPREHENSIVE METABOLIC PANEL
ALT: 34 U/L (ref 0–44)
AST: 36 U/L (ref 15–41)
Albumin: 2.5 g/dL — ABNORMAL LOW (ref 3.5–5.0)
Alkaline Phosphatase: 71 U/L (ref 38–126)
Anion gap: 13 (ref 5–15)
BUN: 22 mg/dL — ABNORMAL HIGH (ref 6–20)
CO2: 21 mmol/L — ABNORMAL LOW (ref 22–32)
Calcium: 8 mg/dL — ABNORMAL LOW (ref 8.9–10.3)
Chloride: 102 mmol/L (ref 98–111)
Creatinine, Ser: 0.9 mg/dL (ref 0.61–1.24)
GFR calc Af Amer: >60 mL/min (ref >60)
GFR calc non Af Amer: >60 mL/min (ref >60)
Glucose, Bld: 202 mg/dL — ABNORMAL HIGH (ref 70–99)
Potassium: 4 mmol/L (ref 3.5–5.1)
Sodium: 136 mmol/L (ref 135–145)
Total Bilirubin: 0.6 mg/dL (ref 0.3–1.2)
Total Protein: 7.1 g/dL (ref 6.5–8.1)

## 2020-05-22 LAB — D-DIMER, QUANTITATIVE: D-Dimer, Quant: 1.66 ug/mL-FEU — ABNORMAL HIGH (ref 0.00–0.50)

## 2020-05-22 MED ORDER — INSULIN ASPART 100 UNIT/ML ~~LOC~~ SOLN
0.0000 [IU] | Freq: Every day | SUBCUTANEOUS | Status: DC
  Administered 2020-05-22 – 2020-05-23 (×2): 4 [IU] via SUBCUTANEOUS
  Administered 2020-05-24: 5 [IU] via SUBCUTANEOUS
  Administered 2020-05-25 – 2020-05-26 (×2): 3 [IU] via SUBCUTANEOUS

## 2020-05-22 MED ORDER — LIVING WELL WITH DIABETES BOOK
Freq: Once | Status: AC
  Filled 2020-05-22: qty 1

## 2020-05-22 MED ORDER — INSULIN GLARGINE 100 UNIT/ML ~~LOC~~ SOLN
30.0000 [IU] | Freq: Every day | SUBCUTANEOUS | Status: DC
  Administered 2020-05-22: 30 [IU] via SUBCUTANEOUS
  Filled 2020-05-22 (×2): qty 0.3

## 2020-05-22 MED ORDER — INSULIN GLARGINE 100 UNIT/ML ~~LOC~~ SOLN
60.0000 [IU] | Freq: Every day | SUBCUTANEOUS | Status: DC
  Administered 2020-05-22: 60 [IU] via SUBCUTANEOUS
  Filled 2020-05-22 (×2): qty 0.6

## 2020-05-22 MED ORDER — INSULIN GLARGINE 100 UNIT/ML ~~LOC~~ SOLN: 50.0000 [IU] | Freq: Two times a day (BID) | SUBCUTANEOUS | Status: DC

## 2020-05-22 MED ORDER — INSULIN GLARGINE 100 UNIT/ML ~~LOC~~ SOLN
80.0000 [IU] | Freq: Every day | SUBCUTANEOUS | Status: DC
  Filled 2020-05-22: qty 0.8

## 2020-05-22 MED ORDER — INSULIN ASPART 100 UNIT/ML ~~LOC~~ SOLN
0.0000 [IU] | Freq: Three times a day (TID) | SUBCUTANEOUS | Status: DC
  Administered 2020-05-22: 7 [IU] via SUBCUTANEOUS
  Administered 2020-05-22: 4 [IU] via SUBCUTANEOUS
  Administered 2020-05-22: 7 [IU] via SUBCUTANEOUS
  Administered 2020-05-23 (×2): 15 [IU] via SUBCUTANEOUS
  Administered 2020-05-23: 20 [IU] via SUBCUTANEOUS
  Administered 2020-05-24 (×2): 15 [IU] via SUBCUTANEOUS
  Administered 2020-05-24: 4 [IU] via SUBCUTANEOUS
  Administered 2020-05-25 – 2020-05-26 (×3): 7 [IU] via SUBCUTANEOUS
  Administered 2020-05-26: 11 [IU] via SUBCUTANEOUS
  Administered 2020-05-26 – 2020-05-27 (×2): 7 [IU] via SUBCUTANEOUS
  Administered 2020-05-27: 11 [IU] via SUBCUTANEOUS
  Administered 2020-05-27: 15 [IU] via SUBCUTANEOUS
  Administered 2020-05-28: 7 [IU] via SUBCUTANEOUS
  Administered 2020-05-28 – 2020-05-29 (×2): 11 [IU] via SUBCUTANEOUS

## 2020-05-22 MED ORDER — WARFARIN SODIUM 5 MG PO TABS
5.0000 mg | ORAL_TABLET | Freq: Once | ORAL | Status: AC
  Administered 2020-05-22: 5 mg via ORAL
  Filled 2020-05-22: qty 1

## 2020-05-22 NOTE — Progress Notes (Signed)
PROGRESS NOTE                                                                                                                                                                                                             Patient Demographics:    Marcus Joseph, is a 49 y.o. male, DOB - 11-06-1970, ZOX:[id number]  Admit date - 05/18/2020   Admitting Physician Carlton Adam, MD  Outpatient Primary MD for the patient is [facility]  LOS - 3   Chief Complaint  Patient presents with  . Shortness of Breath       Brief Narrative     Marcus Spiller. is a 49 y.o. 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  He presented to [facility] emergency room 2 days ago with complaint of shortness of breath and not feeling well.  's work-up was significant for COVID-19 pneumonia, and severe hypoxia, for which she is requiring heated high flow nasal cannula, he was admitted to [facility] for further management, as well he was noted to have significantly elevated hyperglycemia with A1c of 14.4. .    Subjective:    Marcus Joseph today reports episode of exertional dyspnea this morning and some anxiety, denies any chest pain, nausea or vomiting.    Assessment  & Plan :    Active Problems:   History of recurrent pulmonary embolism   Morbid obesity (HCC)   Long term (current) use of anticoagulants   Pneumonia due to COVID-19 virus   Diabetes mellitus with hyperglycemia (HCC)   Hypoxia  Acute hypoxic respiratory failure due to COVID-19 pneumonia -Patient is unvaccinated. -Chest x-ray significant for multifocal pneumonia bilaterally. -He remains with severe COVID-19 pneumonia, with significant lung injury due to COVID-19, he remains in significant oxygen requirement, he is on heated high flow 30 L nasal cannula on  top of 15 L NRB . -Patient was encouraged to use incentive spirometry and flutter valve, and he was able to do modified proning overnight, out of bed to chair. -Continue with IV steroids -Continue with IV remdesivir. -started on Baricitinib. -Continue to trend inflammatory markers. -procalcitonin 0.42, Started on IV Rocephin and azithromycin.     SpO2: 100 % O2 Flow Rate (  L/min): 30 L/min FiO2 (%): 100 %     COVID-19 Labs  Recent Labs    05/21/20 0659 05/22/20 1032  DDIMER 0.93* 1.66*    Lab Results  Component Value Date   SARSCOV2NAA POSITIVE (A) 05/18/2020   History of recurrent PE -He is on warfarin, with subtherapeutic INR on presentation, report he has been compliant with his warfarin, reports he failed Xarelto as outpatient in the past. -Elevated D-dimers, need to bridge till his INR is therapeutic, transitioned from heparin GTT to Arixtra full anticoagulation dose(given history of allergy to Lovenox), INR is 1.9 today, can stop Arixtra tomorrow if INR is 2 or more.  Morbid obesity (HCC)  - Long term (current) use of anticoagulants  Diabetes mellitus -Very poorly controlled controlled, A1c of 14.4 -Started on Tradjenta. -At this has been adjusted, currently on 60 units daily and 40 units at bedtime, change to sliding scale 3 times a day before meals and bedtime sliding scale as well, and continue with premeals NovoLog.    Hypertension -Soft blood pressure, hold Cozaar  Code Status : Full  Family communication: patient is appropriate, coherent, he was updated in detail, all his questions answered.  I have offered to update family, but patient reports he will update them himself.  Disposition Plan  :  Status is: Inpatient  Remains inpatient appropriate because:IV treatments appropriate due to intensity of illness or inability to take PO   Dispo: The patient is from: Home              Anticipated d/c is to: Home              Anticipated d/c date is: > 3  days              Patient currently is not medically stable to d/c.    Consults  :  None  Procedures  : none  DVT Prophylaxis  :  Heparin >>Arixtra(till INR is therapeutic).  Lab Results  Component Value Date   PLT 271 05/22/2020    Antibiotics  :    Anti-infectives (From admission, onward)   Start     Dose/Rate Route Frequency Ordered Stop   05/21/20 1300  cefTRIAXone (ROCEPHIN) 2 g in sodium chloride 0.9 % 100 mL IVPB        2 g 200 mL/hr over 30 Minutes Intravenous Every 24 hours 05/21/20 0901 05/25/20 1259   05/21/20 1230  cefTRIAXone (ROCEPHIN) 2 g in sodium chloride 0.9 % 100 mL IVPB  Status:  Discontinued        2 g 200 mL/hr over 30 Minutes Intravenous Every 24 hours 05/20/20 1346 05/20/20 1349   05/21/20 1000  remdesivir 100 mg in sodium chloride 0.9 % 100 mL IVPB  Status:  Discontinued       "Followed by" Linked Group Details   100 mg 200 mL/hr over 30 Minutes Intravenous Daily 05/20/20 0139 05/20/20 0142   05/20/20 1349  cefTRIAXone (ROCEPHIN) 2 g in sodium chloride 0.9 % 100 mL IVPB  Status:  Discontinued        2 g 200 mL/hr over 30 Minutes Intravenous Every 24 hours 05/20/20 1349 05/21/20 0901   05/20/20 1300  azithromycin (ZITHROMAX) 500 mg in sodium chloride 0.9 % 250 mL IVPB        500 mg 250 mL/hr over 60 Minutes Intravenous Every 24 hours 05/20/20 1155 05/24/20 2359   05/20/20 1230  cefTRIAXone (ROCEPHIN) 1 g in sodium chloride 0.9 % 100 mL IVPB  Status:  Discontinued        1 g 200 mL/hr over 30 Minutes Intravenous Every 24 hours 05/20/20 1155 05/20/20 1346   05/20/20 0139  remdesivir 200 mg in sodium chloride 0.9% 250 mL IVPB  Status:  Discontinued       "Followed by" Linked Group Details   200 mg 580 mL/hr over 30 Minutes Intravenous Once 05/20/20 0139 05/20/20 0142   05/19/20 1000  remdesivir 100 mg in sodium chloride 0.9 % 100 mL IVPB       "Followed by" Linked Group Details   100 mg 200 mL/hr over 30 Minutes Intravenous Daily 05/18/20 2059  05/22/20 0912   05/18/20 2130  remdesivir 100 mg in sodium chloride 0.9 % 100 mL IVPB       "Followed by" Linked Group Details   100 mg 200 mL/hr over 30 Minutes Intravenous  Once 05/18/20 2059 05/18/20 2242   05/18/20 2100  remdesivir 100 mg in sodium chloride 0.9 % 100 mL IVPB       "Followed by" Linked Group Details   100 mg 200 mL/hr over 30 Minutes Intravenous  Once 05/18/20 2059 05/18/20 2205        Objective:   Vitals:   05/22/20 1138 05/22/20 1322 05/22/20 1350 05/22/20 1355  BP: 126/80   108/64  Pulse: 68   77  Resp: 16 20 18 15   Temp:    97.7 F (36.5 C)  TempSrc:    Oral  SpO2: 92% 100%  100%  Weight:      Height:        Wt Readings from Last 3 Encounters:  05/20/20 (!) 143 kg  01/23/20 (!) 141.5 kg  10/30/19 (!) 142.9 kg     Intake/Output Summary (Last 24 hours) at 05/22/2020 1454 Last data filed at 05/22/2020 1100 Gross per 24 hour  Intake 480 ml  Output 825 ml  Net -345 ml     Physical Exam  Awake Alert, Oriented X 3, No new F.N deficits, Normal affect Symmetrical Chest wall movement, Good air movement bilaterally, CTAB RRR,No Gallops,Rubs or new Murmurs, No Parasternal Heave +ve B.Sounds, Abd Soft, No tenderness, No rebound - guarding or rigidity. No Cyanosis, Clubbing or edema, No new Rash or bruise      Data Review:    CBC Recent Labs  Lab 05/18/20 1925 05/20/20 0853 05/21/20 0659 05/22/20 1032  WBC 5.7 11.9* 13.9* 14.5*  HGB 14.4 14.6 14.9 15.7  HCT 44.0 44.7 44.3 47.4  PLT 173 225 271 271  MCV 83.5 83.1 82.6 83.0  MCH 27.3 27.1 27.8 27.5  MCHC 32.7 32.7 33.6 33.1  RDW 13.8 13.8 13.9 14.0  LYMPHSABS 0.8  --  1.3 1.3  MONOABS 0.4  --  0.7 0.6  EOSABS 0.0  --  0.1 0.0  BASOSABS 0.0  --  0.1 0.1    Chemistries  Recent Labs  Lab 05/18/20 1925 05/21/20 0659 05/22/20 1032  NA 127* 133* 136  K 5.1 4.7 4.0  CL 91* 97* 102  CO2 21* 21* 21*  GLUCOSE 553* 182* 202*  BUN 19 27* 22*  CREATININE 1.11 0.97 0.90  CALCIUM 8.4*  8.0* 8.0*  AST 96* 54* 36  ALT 68* 38 34  ALKPHOS 60 63 71  BILITOT 1.0 0.7 0.6   ------------------------------------------------------------------------------------------------------------------ No results for input(s): CHOL, HDL, LDLCALC, TRIG, CHOLHDL, LDLDIRECT in the last 72 hours.  Lab Results  Component Value Date   HGBA1C 14.4 (H) 05/19/2020   ------------------------------------------------------------------------------------------------------------------ No  results for input(s): TSH, T4TOTAL, T3FREE, THYROIDAB in the last 72 hours.  Invalid input(s): FREET3 ------------------------------------------------------------------------------------------------------------------ No results for input(s): VITAMINB12, FOLATE, FERRITIN, TIBC, IRON, RETICCTPCT in the last 72 hours.  Coagulation profile Recent Labs  Lab 05/18/20 1925 05/19/20 0807 05/21/20 0659 05/22/20 1032  INR 1.0 1.0 1.3* 1.9*    Recent Labs    05/21/20 0659 05/22/20 1032  DDIMER 0.93* 1.66*    Cardiac Enzymes No results for input(s): CKMB, TROPONINI, MYOGLOBIN in the last 168 hours.  Invalid input(s): CK ------------------------------------------------------------------------------------------------------------------    Component Value Date/Time   BNP 14.5 06/07/2015 1455    Inpatient Medications  Scheduled Meds: . aspirin EC  81 mg Oral Daily  . baricitinib  4 mg Oral Daily  . fondaparinux (ARIXTRA) injection  10 mg Subcutaneous Q24H  . insulin aspart  0-20 Units Subcutaneous TID WC  . insulin aspart  0-5 Units Subcutaneous QHS  . insulin aspart  8 Units Subcutaneous TID WC  . insulin glargine  30 Units Subcutaneous QHS  . insulin glargine  60 Units Subcutaneous Daily  . linagliptin  5 mg Oral Daily  . methylPREDNISolone (SOLU-MEDROL) injection  60 mg Intravenous Q6H  . pantoprazole  40 mg Oral Daily  . warfarin  5 mg Oral ONCE-1600  . Warfarin - Pharmacist Dosing Inpatient   Does not  apply q1600   Continuous Infusions: . azithromycin 500 mg (05/22/20 1227)  . cefTRIAXone (ROCEPHIN)  IV 2 g (05/22/20 1348)   PRN Meds:.acetaminophen, albuterol, guaiFENesin-dextromethorphan, senna-docusate, sodium chloride  Micro Results Recent Results (from the past 240 hour(s))  SARS Coronavirus 2 by RT PCR (hospital order, performed in [facility] hospital lab) Nasopharyngeal Nasopharyngeal Swab     Status: Abnormal   Collection Time: 05/18/20  6:51 PM   Specimen: Nasopharyngeal Swab  Result Value Ref Range Status   SARS Coronavirus 2 POSITIVE (A) NEGATIVE Final    Comment: RESULT CALLED TO, READ BACK BY AND VERIFIED WITH: CALLED TO K.NEAL RN AT 1938 BY SROY ON [phone] (NOTE) SARS-CoV-2 target nucleic acids are DETECTED  SARS-CoV-2 RNA is generally detectable in upper respiratory specimens  during the acute phase of infection.  Positive results are indicative  of the presence of the identified virus, but do not rule out bacterial infection or co-infection with other pathogens not detected by the test.  Clinical correlation with patient history and  other diagnostic information is necessary to determine patient infection status.  The expected result is negative.  Fact Sheet for Patients:   BoilerBrush.com.cyhttps://www.fda.gov/media/136312/download   Fact Sheet for Healthcare Providers:   https://pope.com/https://www.fda.gov/media/136313/download    This test is not yet approved or cleared by the [place] FDA and  has been authorized for detection and/or diagnosis of SARS-CoV-2 by FDA under an Emergency Use Authorization (EUA).  This EUA will remain in effect (mean ing this test can be used) for the duration of  the COVID-19 declaration under Section 564(b)(1) of the Act, 21 U.S.C. section 360-bbb-3(b)(1), unless the authorization is terminated or revoked sooner.  Performed at [facility] [place], [address] [place], [place] [address]   Blood Culture (routine x 2)     Status: None  (Preliminary result)   Collection Time: 05/18/20  7:26 PM   Specimen: BLOOD  Result Value Ref Range Status   Specimen Description   Final    BLOOD LEFT ANTECUBITAL Performed at [facility], [address] [place], [place] [address]    Special Requests   Final  BOTTLES DRAWN AEROBIC AND ANAEROBIC Blood Culture adequate volume Performed at [facility], [address] [place], [place] [address]    Culture   Final    NO GROWTH 3 DAYS Performed at [facility], [address] [place], [place] [address]    Report Status PENDING  Incomplete  Blood Culture (routine x 2)     Status: None (Preliminary result)   Collection Time: 05/18/20  9:39 PM   Specimen: BLOOD LEFT WRIST  Result Value Ref Range Status   Specimen Description   Final    BLOOD LEFT WRIST Performed at [facility], [address] [place], [place] [address]    Special Requests   Final    BOTTLES DRAWN AEROBIC AND ANAEROBIC Blood Culture adequate volume Performed at [facility], [address] [place], [place] [address]    Culture   Final    NO GROWTH 2 DAYS Performed at [facility], [address] [place], [place] [address]    Report Status PENDING  Incomplete    Radiology Reports DG Chest Portable 1 View  Result Date: 05/18/2020 CLINICAL DATA:  Shortness of breath, rule out COVID. EXAM: PORTABLE CHEST 1 VIEW COMPARISON:  Chest x-ray 09/27/2018. FINDINGS: Sternotomy wires are again noted. The heart size and mediastinal contours are within normal limits. Diffuse hazy airspace opacities that are more prominent in the peripheral left lung. No pulmonary edema. No pleural effusion. No pneumothorax. No acute osseous abnormality. IMPRESSION: Multifocal pneumonia likely representing known COVID-19 infection Electronically Signed   By: Tish Frederickson M.D.   On: 05/18/2020 20:17     Huey Bienenstock M.D on 05/22/2020 at 2:54 PM    [facility] -  Office   [phone]

## 2020-05-22 NOTE — Progress Notes (Signed)
ANTICOAGULATION CONSULT NOTE  Pharmacy Consult for heparin>>Arixtra Indication: Hx DVT/PE  Allergies  Allergen Reactions  . Contrast Media [Iodinated Diagnostic Agents] Itching    Pt states he thinks it makes him itch  . Lovenox [Enoxaparin Sodium] Itching    Itching over entire body  . Percocet [Oxycodone-Acetaminophen] Hives and Itching    Patient Measurements: Height: 5\' 10"  (177.8 cm) Weight: (!) 143 kg (315 lb 4.1 oz) IBW/kg (Calculated) : 73 Heparin Dosing Weight: 107 kg   Vital Signs: Temp: 98.2 F (36.8 C) (09/24 0952) Temp Source: Oral (09/24 0952) BP: 126/80 (09/24 1138) Pulse Rate: 68 (09/24 1138)  Labs: Recent Labs    05/19/20 2210 05/20/20 0853 05/20/20 0853 05/21/20 0659 05/22/20 1032  HGB  --  14.6   < > 14.9 15.7  HCT  --  44.7  --  44.3 47.4  PLT  --  225  --  271 271  LABPROT  --   --   --  15.9* 20.8*  INR  --   --   --  1.3* 1.9*  HEPARINUNFRC 0.54 0.42  --   --   --   CREATININE  --   --   --  0.97 0.90   < > = values in this interval not displayed.    Estimated Creatinine Clearance: 143.4 mL/min (by C-G formula based on SCr of 0.9 mg/dL).    Assessment: Patient with history of recurrent clots, on long-term warfarin - admission INR = 1.0 (down from 3.4 eight days ago); patient also has itching allergy to lovenox but has received heparin at [facility] facilities in the recent past.   Continues on Arixtra for bridge and warfarin INR today 1.9 (trending up) No bleeding noted  Goal of Therapy:  INR 2-3 Monitor platelets by anticoagulation protocol: Yes   Plan:  Continue Arixtra 10mg  SQ q24 Repeat Coumadin 5 mg po x 1 Daily CBC/INR  Thank you [facility], PharmD [phone] 05/22/2020 12:56 PM

## 2020-05-22 NOTE — TOC Initial Note (Signed)
Transition of Care (TOC) - Initial/Assessment Note    Patient Details  Name: Marcus Joseph. MRN: [id number] Date of Birth: [birth year]  Transition of Care [facility]) CM/SW Contact:    Lockie Pares, RN Phone Number: 05/22/2020, 4:23 PM  Clinical Narrative:                 Self pay patient unvaccinated in with COVID symptoms and hypoxia . On quite a bit of oxygen currently., HFNC at 30 and NRB at present.  [facility] hospitalization [facility]t [facility] [place].  Will likely need home oxygen, may need lOG and MATCH for medications.  CM will follow patient and discuss the above to acerrtain needs.   Expected Discharge Plan: Home/Self Care Barriers to Discharge: Continued Medical Work up   Patient Goals and CMS Choice        Expected Discharge Plan and Services Expected Discharge Plan: Home/Self Care   Discharge Planning Services: Follow-up appt scheduled   Living arrangements for the past 2 months: Single Family Home                                      Prior Living Arrangements/Services Living arrangements for the past 2 months: Single Family Home   Patient language and need for interpreter reviewed:: Yes        Need for Family Participation in Patient Care: Yes (Comment) Care giver support system in place?: Yes (comment)   Criminal Activity/Legal Involvement Pertinent to Current Situation/Hospitalization: No - Comment as needed  Activities of Daily Living Home Assistive Devices/Equipment: None ADL Screening (condition at time of admission) Patient's cognitive ability adequate to safely complete daily activities?: Yes Is the patient deaf or have difficulty hearing?: No Does the patient have difficulty seeing, even when wearing glasses/contacts?: No Does the patient have difficulty concentrating, remembering, or making decisions?: No Patient able to express need for assistance with ADLs?: Yes Does the patient have difficulty dressing or bathing?:  No Independently performs ADLs?: Yes (appropriate for developmental age) Does the patient have difficulty walking or climbing stairs?: No Weakness of Legs: None Weakness of Arms/Hands: None  Permission Sought/Granted                  Emotional Assessment       Orientation: : Oriented to Self, Oriented to Place, Oriented to  Time, Oriented to Situation Alcohol / Substance Use: Not Applicable Psych Involvement: No (comment)  Admission diagnosis:  Pneumonia due to COVID-19 virus [U07.1, J12.82] Patient Active Problem List   Diagnosis Date Noted  . Diabetes mellitus with hyperglycemia (HCC) 05/20/2020  . Hypoxia 05/20/2020  . Pneumonia due to COVID-19 virus 05/18/2020  . Sleep apnea 10/26/2018  . Ischemic cardiomyopathy 09/11/2018  . Arterial embolism of right leg (HCC) 09/11/2018  . Primary hypercoagulable state (HCC) 09/03/2018  . Long term (current) use of anticoagulants 09/03/2018  . S/P CABG x 2, Resection of LV Thrombus 08/25/2018  . 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 Intracardiac thrombosis, not elsewhere classified 08/18/2018  . CAP (community acquired pneumonia) 08/18/2018  . Deep venous thrombosis (HCC) 04/25/2018  . Esophageal reflux 11/21/2017  . Elevated BP   . Pleuritic chest pain 05/21/2016  . Atypical chest pain 05/21/2016  . Upper airway cough syndrome 12/03/2014  . Faintness   . Syncope, tussive 11/01/2014  . Diabetes mellitus type 2, uncontrolled (HCC) 11/01/2014  .  Hematuria   . History of recurrent pulmonary embolism 10/01/2014  . Hyperglycemia 10/01/2014  . Elevated troponin 10/01/2014  . Diabetes mellitus (HCC) 10/01/2014  . Morbid obesity (HCC) 10/01/2014  . Pulmonary embolism (HCC) 09/30/2014  . OA (osteoarthritis) of knee 06/02/2014   PCP:  [facility] Pharmacy:   [facility] [phone] - [place], [place] - [phone] [facility] [address] &  [address] [place] [address] Phone: [phone] Fax: [phone]  [facility] - [place], [place] - [address] [place] [address] Phone: [phone] Fax: [phone]     Social Determinants of Health (SDOH) Interventions    Readmission Risk Interventions No flowsheet data found.

## 2020-05-22 NOTE — Progress Notes (Signed)
   05/22/20 0411  Assess: MEWS Score  Temp 98.7 F (37.1 C)  BP 102/66  ECG Heart Rate 67  Resp (!) 26  Level of Consciousness Alert  SpO2 98 %  O2 Device HFNC  Heater temperature 89.6 F (32 C)  O2 Flow Rate (L/min) 35 L/min  FiO2 (%) 100 %  Assess: MEWS Score  MEWS Temp 0  MEWS Systolic 0  MEWS Pulse 0  MEWS RR 2  MEWS LOC 0  MEWS Score 2  MEWS Score Color Yellow  Assess: if the MEWS score is Yellow or Red  Were vital signs taken at a resting state? Yes  Focused Assessment No change from prior assessment  Early Detection of Sepsis Score *See Row Information* Low  MEWS guidelines implemented *See Row Information* Yes  Treat  MEWS Interventions Other (Comment) (repositioned in bed)  Pain Scale 0-10  Pain Score 0  Take Vital Signs  Increase Vital Sign Frequency  Yellow: Q 2hr X 2 then Q 4hr X 2, if remains yellow, continue Q 4hrs

## 2020-05-23 LAB — CBC WITH DIFFERENTIAL/PLATELET
Abs Immature Granulocytes: 0.26 10*3/uL — ABNORMAL HIGH (ref 0.00–0.07)
Basophils Absolute: 0 10*3/uL (ref 0.0–0.1)
Basophils Relative: 0 %
Eosinophils Absolute: 0 10*3/uL (ref 0.0–0.5)
Eosinophils Relative: 0 %
HCT: 44.3 % (ref 39.0–52.0)
Hemoglobin: 14.3 g/dL (ref 13.0–17.0)
Immature Granulocytes: 2 %
Lymphocytes Relative: 8 %
Lymphs Abs: 1.1 10*3/uL (ref 0.7–4.0)
MCH: 26.8 pg (ref 26.0–34.0)
MCHC: 32.3 g/dL (ref 30.0–36.0)
MCV: 83.1 fL (ref 80.0–100.0)
Monocytes Absolute: 0.7 10*3/uL (ref 0.1–1.0)
Monocytes Relative: 5 %
Neutro Abs: 12 10*3/uL — ABNORMAL HIGH (ref 1.7–7.7)
Neutrophils Relative %: 85 %
Platelets: 278 10*3/uL (ref 150–400)
RBC: 5.33 MIL/uL (ref 4.22–5.81)
RDW: 13.8 % (ref 11.5–15.5)
WBC: 14.1 10*3/uL — ABNORMAL HIGH (ref 4.0–10.5)
nRBC: 0 % (ref 0.0–0.2)

## 2020-05-23 LAB — GLUCOSE, CAPILLARY
Glucose-Capillary: 303 mg/dL — ABNORMAL HIGH (ref 70–99)
Glucose-Capillary: 328 mg/dL — ABNORMAL HIGH (ref 70–99)
Glucose-Capillary: 359 mg/dL — ABNORMAL HIGH (ref 70–99)
Glucose-Capillary: 413 mg/dL — ABNORMAL HIGH (ref 70–99)
Glucose-Capillary: 337 mg/dL — ABNORMAL HIGH (ref 70–99)

## 2020-05-23 LAB — PROTIME-INR
INR: 2.3 — ABNORMAL HIGH (ref 0.8–1.2)
Prothrombin Time: 24.9 seconds — ABNORMAL HIGH (ref 11.4–15.2)

## 2020-05-23 LAB — COMPREHENSIVE METABOLIC PANEL
ALT: 29 U/L (ref 0–44)
AST: 30 U/L (ref 15–41)
Albumin: 2.3 g/dL — ABNORMAL LOW (ref 3.5–5.0)
Alkaline Phosphatase: 73 U/L (ref 38–126)
Anion gap: 10 (ref 5–15)
BUN: 23 mg/dL — ABNORMAL HIGH (ref 6–20)
CO2: 22 mmol/L (ref 22–32)
Calcium: 7.8 mg/dL — ABNORMAL LOW (ref 8.9–10.3)
Chloride: 99 mmol/L (ref 98–111)
Creatinine, Ser: 0.94 mg/dL (ref 0.61–1.24)
GFR calc Af Amer: >60 mL/min (ref >60)
GFR calc non Af Amer: >60 mL/min (ref >60)
Glucose, Bld: 332 mg/dL — ABNORMAL HIGH (ref 70–99)
Potassium: 4.2 mmol/L (ref 3.5–5.1)
Sodium: 131 mmol/L — ABNORMAL LOW (ref 135–145)
Total Bilirubin: 0.7 mg/dL (ref 0.3–1.2)
Total Protein: 6.2 g/dL — ABNORMAL LOW (ref 6.5–8.1)

## 2020-05-23 LAB — D-DIMER, QUANTITATIVE: D-Dimer, Quant: 1.96 ug/mL-FEU — ABNORMAL HIGH (ref 0.00–0.50)

## 2020-05-23 LAB — CULTURE, BLOOD (ROUTINE X 2)
Culture: NO GROWTH
Special Requests: ADEQUATE

## 2020-05-23 MED ORDER — INSULIN GLARGINE 100 UNIT/ML ~~LOC~~ SOLN
50.0000 [IU] | Freq: Every day | SUBCUTANEOUS | Status: DC
  Administered 2020-05-23: 50 [IU] via SUBCUTANEOUS
  Filled 2020-05-23 (×2): qty 0.5

## 2020-05-23 MED ORDER — WARFARIN SODIUM 5 MG PO TABS
5.0000 mg | ORAL_TABLET | Freq: Once | ORAL | Status: AC
  Administered 2020-05-23: 5 mg via ORAL
  Filled 2020-05-23: qty 1

## 2020-05-23 MED ORDER — INSULIN ASPART 100 UNIT/ML ~~LOC~~ SOLN
12.0000 [IU] | Freq: Three times a day (TID) | SUBCUTANEOUS | Status: DC
  Administered 2020-05-23 – 2020-05-29 (×18): 12 [IU] via SUBCUTANEOUS

## 2020-05-23 MED ORDER — INSULIN GLARGINE 100 UNIT/ML ~~LOC~~ SOLN
75.0000 [IU] | Freq: Every day | SUBCUTANEOUS | Status: DC
  Administered 2020-05-23 – 2020-05-24 (×2): 75 [IU] via SUBCUTANEOUS
  Filled 2020-05-23 (×2): qty 0.75

## 2020-05-23 MED ORDER — INSULIN GLARGINE 100 UNIT/ML ~~LOC~~ SOLN
45.0000 [IU] | Freq: Every day | SUBCUTANEOUS | Status: DC
  Filled 2020-05-23: qty 0.45

## 2020-05-23 NOTE — Plan of Care (Signed)
  Problem: Education: Goal: Knowledge of risk factors and measures for prevention of condition will improve Outcome: Progressing   Problem: Coping: Goal: Psychosocial and spiritual needs will be supported Outcome: Progressing   Problem: Respiratory: Goal: Will maintain a patent airway Outcome: Progressing Goal: Complications related to the disease process, condition or treatment will be avoided or minimized Outcome: Progressing   Problem: Education: Goal: Knowledge of General Education information will improve Description: Including pain rating scale, medication(s)/side effects and non-pharmacologic comfort measures Outcome: Progressing   Problem: Health Behavior/Discharge Planning: Goal: Ability to manage health-related needs will improve Outcome: Progressing   Problem: Clinical Measurements: Goal: Ability to maintain clinical measurements within normal limits will improve Outcome: Progressing Goal: Will remain free from infection Outcome: Progressing Goal: Respiratory complications will improve Outcome: Progressing   Problem: Activity: Goal: Risk for activity intolerance will decrease Outcome: Progressing   Problem: Coping: Goal: Level of anxiety will decrease Outcome: Progressing   Problem: Elimination: Goal: Will not experience complications related to bowel motility Outcome: Progressing   Problem: Safety: Goal: Ability to remain free from injury will improve Outcome: Progressing   Problem: Skin Integrity: Goal: Risk for impaired skin integrity will decrease Outcome: Progressing

## 2020-05-23 NOTE — Progress Notes (Signed)
PROGRESS NOTE                                                                                                                                                                                                             Patient Demographics:    Marcus Joseph, is a 49 y.o. male, DOB - 11/10/1970, JOI:[id number]  Admit date - 05/18/2020   Admitting Physician Carlton Adam, MD  Outpatient Primary MD for the patient is [facility]  LOS - 4   Chief Complaint  Patient presents with  . Shortness of Breath       Brief Narrative     [name]. is a 49 y.o. 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  He presented to [facility] emergency room 2 days ago with complaint of shortness of breath and not feeling well.  's work-up was significant for COVID-19 pneumonia, and severe hypoxia, for which she is requiring heated high flow nasal cannula, he was admitted to [facility] for further management, as well he was noted to have significantly elevated hyperglycemia with A1c of 14.4. .    Subjective:    [facility] today reports dyspnea, cough, he does report some epistaxis .   Assessment  & Plan :    Active Problems:   History of recurrent pulmonary embolism   Morbid obesity (HCC)   Long term (current) use of anticoagulants   Pneumonia due to COVID-19 virus   Diabetes mellitus with hyperglycemia (HCC)   Hypoxia  Acute hypoxic respiratory failure due to COVID-19 pneumonia -Patient is unvaccinated. -Chest x-ray significant for multifocal pneumonia bilaterally. -He remains with severe COVID-19 pneumonia, with significant lung injury due to COVID-19, remains with significant oxygen requirement, I have instructed him to use NRB continuous at this point, and I have decreased his heated high flow to 20  L at 80% , trying to minimize nasal irritation from heated high flow. -Patient was encouraged to use incentive spirometry and flutter valve, and he was able to do modified proning overnight, out of bed to chair. -Continue with IV steroids -Continue with IV remdesivir. -started on Baricitinib. -Continue to trend inflammatory markers. -procalcitonin 0.42, Started on IV Rocephin and azithromycin.   SpO2: Marland Kitchen)  87 % O2 Flow Rate (L/min): (S) 35 L/min (35L HHFNC/15L NRB) FiO2 (%): 100 %     COVID-19 Labs  Recent Labs    05/21/20 0659 05/22/20 1032 05/23/20 0238  DDIMER 0.93* 1.66* 1.96*    Lab Results  Component Value Date   SARSCOV2NAA POSITIVE (A) 05/18/2020   History of recurrent PE -He is on warfarin, with subtherapeutic INR on presentation, report he has been compliant with his warfarin, reports he failed Xarelto as outpatient in the past. -Elevated D-dimers, need to bridge till his INR is therapeutic, transitioned from heparin GTT to Arixtra full anticoagulation dose(given history of allergy to Lovenox), his INR is therapeutic today, will stop Arixtra.     Morbid obesity (HCC)  - Long term (current) use of anticoagulants  Diabetes mellitus -Very poorly controlled controlled, A1c of 14.4 -Started on Tradjenta. -Remains poorly controlled, increase Lantus 75 units daily, and to 50 units at bedtime, continue with insulin sliding scale, increased Premeal NovoLog to 12 units 3 times daily .  Hypertension -Soft blood pressure, hold Cozaar  Epistaxis -Mild, due to mild nasal irritation from heated high flow, encouraged to use nasal spray, trying to minimize oxygen flow via heated oxygen.  Code Status : Full  Family communication: patient is appropriate, coherent, he was updated in detail, all his questions answered. patient reports he will update them himself.  Disposition Plan  :  Status is: Inpatient  Remains inpatient appropriate because:IV treatments appropriate  due to intensity of illness or inability to take PO   Dispo: The patient is from: Home              Anticipated d/c is to: Home              Anticipated d/c date is: > 3 days              Patient currently is not medically stable to d/c.    Consults  :  None  Procedures  : none  DVT Prophylaxis  :  Heparin >>Arixtra(till INR is therapeutic).  Lab Results  Component Value Date   PLT 278 05/23/2020    Antibiotics  :    Anti-infectives (From admission, onward)   Start     Dose/Rate Route Frequency Ordered Stop   05/21/20 1300  cefTRIAXone (ROCEPHIN) 2 g in sodium chloride 0.9 % 100 mL IVPB        2 g 200 mL/hr over 30 Minutes Intravenous Every 24 hours 05/21/20 0901 05/25/20 1259   05/21/20 1230  cefTRIAXone (ROCEPHIN) 2 g in sodium chloride 0.9 % 100 mL IVPB  Status:  Discontinued        2 g 200 mL/hr over 30 Minutes Intravenous Every 24 hours 05/20/20 1346 05/20/20 1349   05/21/20 1000  remdesivir 100 mg in sodium chloride 0.9 % 100 mL IVPB  Status:  Discontinued       "Followed by" Linked Group Details   100 mg 200 mL/hr over 30 Minutes Intravenous Daily 05/20/20 0139 05/20/20 0142   05/20/20 1349  cefTRIAXone (ROCEPHIN) 2 g in sodium chloride 0.9 % 100 mL IVPB  Status:  Discontinued        2 g 200 mL/hr over 30 Minutes Intravenous Every 24 hours 05/20/20 1349 05/21/20 0901   05/20/20 1300  azithromycin (ZITHROMAX) 500 mg in sodium chloride 0.9 % 250 mL IVPB        500 mg 250 mL/hr over 60 Minutes Intravenous Every 24 hours 05/20/20 1155 05/24/20 2359  05/20/20 1230  cefTRIAXone (ROCEPHIN) 1 g in sodium chloride 0.9 % 100 mL IVPB  Status:  Discontinued        1 g 200 mL/hr over 30 Minutes Intravenous Every 24 hours 05/20/20 1155 05/20/20 1346   05/20/20 0139  remdesivir 200 mg in sodium chloride 0.9% 250 mL IVPB  Status:  Discontinued       "Followed by" Linked Group Details   200 mg 580 mL/hr over 30 Minutes Intravenous Once 05/20/20 0139 05/20/20 0142   05/19/20  1000  remdesivir 100 mg in sodium chloride 0.9 % 100 mL IVPB       "Followed by" Linked Group Details   100 mg 200 mL/hr over 30 Minutes Intravenous Daily 05/18/20 2059 05/22/20 2103   05/18/20 2130  remdesivir 100 mg in sodium chloride 0.9 % 100 mL IVPB       "Followed by" Linked Group Details   100 mg 200 mL/hr over 30 Minutes Intravenous  Once 05/18/20 2059 05/18/20 2242   05/18/20 2100  remdesivir 100 mg in sodium chloride 0.9 % 100 mL IVPB       "Followed by" Linked Group Details   100 mg 200 mL/hr over 30 Minutes Intravenous  Once 05/18/20 2059 05/18/20 2205        Objective:   Vitals:   05/23/20 0429 05/23/20 0721 05/23/20 0809 05/23/20 1212  BP: 108/63   107/62  Pulse: 71 66 88 69  Resp: 19 18 (!) 28 18  Temp: 98.7 F (37.1 C) 98.3 F (36.8 C)  98.1 F (36.7 C)  TempSrc: Oral Oral  Oral  SpO2: 90% 92% (!) 65% (!) 87%  Weight:      Height:        Wt Readings from Last 3 Encounters:  05/20/20 (!) 143 kg  01/23/20 (!) 141.5 kg  10/30/19 (!) 142.9 kg     Intake/Output Summary (Last 24 hours) at 05/23/2020 1448 Last data filed at 05/23/2020 1446 Gross per 24 hour  Intake 600 ml  Output --  Net 600 ml     Physical Exam  Awake Alert, Oriented X 3, No new F.N deficits, Normal affect Symmetrical Chest wall movement, Good air movement bilaterally, CTAB RRR,No Gallops,Rubs or new Murmurs, No Parasternal Heave +ve B.Sounds, Abd Soft, No tenderness, No rebound - guarding or rigidity. No Cyanosis, Clubbing or edema, No new Rash or bruise      Data Review:    CBC Recent Labs  Lab 05/18/20 1925 05/20/20 0853 05/21/20 0659 05/22/20 1032 05/23/20 0238  WBC 5.7 11.9* 13.9* 14.5* 14.1*  HGB 14.4 14.6 14.9 15.7 14.3  HCT 44.0 44.7 44.3 47.4 44.3  PLT 173 225 271 271 278  MCV 83.5 83.1 82.6 83.0 83.1  MCH 27.3 27.1 27.8 27.5 26.8  MCHC 32.7 32.7 33.6 33.1 32.3  RDW 13.8 13.8 13.9 14.0 13.8  LYMPHSABS 0.8  --  1.3 1.3 1.1  MONOABS 0.4  --  0.7 0.6 0.7   EOSABS 0.0  --  0.1 0.0 0.0  BASOSABS 0.0  --  0.1 0.1 0.0    Chemistries  Recent Labs  Lab 05/18/20 1925 05/21/20 0659 05/22/20 1032 05/23/20 0238  NA 127* 133* 136 131*  K 5.1 4.7 4.0 4.2  CL 91* 97* 102 99  CO2 21* 21* 21* 22  GLUCOSE 553* 182* 202* 332*  BUN 19 27* 22* 23*  CREATININE 1.11 0.97 0.90 0.94  CALCIUM 8.4* 8.0* 8.0* 7.8*  AST 96* 54* 36 30  ALT 68* 38 34 29  ALKPHOS 60 63 71 73  BILITOT 1.0 0.7 0.6 0.7   ------------------------------------------------------------------------------------------------------------------ No results for input(s): CHOL, HDL, LDLCALC, TRIG, CHOLHDL, LDLDIRECT in the last 72 hours.  Lab Results  Component Value Date   HGBA1C 14.4 (H) 05/19/2020   ------------------------------------------------------------------------------------------------------------------ No results for input(s): TSH, T4TOTAL, T3FREE, THYROIDAB in the last 72 hours.  Invalid input(s): FREET3 ------------------------------------------------------------------------------------------------------------------ No results for input(s): VITAMINB12, FOLATE, FERRITIN, TIBC, IRON, RETICCTPCT in the last 72 hours.  Coagulation profile Recent Labs  Lab 05/18/20 1925 05/19/20 0807 05/21/20 0659 05/22/20 1032 05/23/20 0238  INR 1.0 1.0 1.3* 1.9* 2.3*    Recent Labs    05/22/20 1032 05/23/20 0238  DDIMER 1.66* 1.96*    Cardiac Enzymes No results for input(s): CKMB, TROPONINI, MYOGLOBIN in the last 168 hours.  Invalid input(s): CK ------------------------------------------------------------------------------------------------------------------    Component Value Date/Time   BNP 14.5 06/07/2015 1455    Inpatient Medications  Scheduled Meds: . aspirin EC  81 mg Oral Daily  . baricitinib  4 mg Oral Daily  . insulin aspart  0-20 Units Subcutaneous TID WC  . insulin aspart  0-5 Units Subcutaneous QHS  . insulin aspart  8 Units Subcutaneous TID WC  .  insulin glargine  45 Units Subcutaneous QHS  . insulin glargine  75 Units Subcutaneous Daily  . linagliptin  5 mg Oral Daily  . methylPREDNISolone (SOLU-MEDROL) injection  60 mg Intravenous Q6H  . pantoprazole  40 mg Oral Daily  . warfarin  5 mg Oral ONCE-1600  . Warfarin - Pharmacist Dosing Inpatient   Does not apply q1600   Continuous Infusions: . azithromycin 500 mg (05/23/20 1411)  . cefTRIAXone (ROCEPHIN)  IV 2 g (05/23/20 1320)   PRN Meds:.acetaminophen, albuterol, guaiFENesin-dextromethorphan, senna-docusate, sodium chloride  Micro Results Recent Results (from the past 240 hour(s))  SARS Coronavirus 2 by RT PCR (hospital order, performed [facility]) Nasopharyngeal Nasopharyngeal Swab     Status: Abnormal   Collection Time: 05/18/20  6:51 PM   Specimen: Nasopharyngeal Swab  Result Value Ref Range Status   SARS Coronavirus 2 POSITIVE (A) NEGATIVE Final    Comment: RESULT CALLED TO, READ BACK BY AND VERIFIED WITH: CALLED TO K.NEAL RN AT 1938 BY SROY ON [id number] (NOTE) SARS-CoV-2 target nucleic acids are DETECTED  SARS-CoV-2 RNA is generally detectable in upper respiratory specimens  during the acute phase of infection.  Positive results are indicative  of the presence of the identified virus, but do not rule out bacterial infection or co-infection with other pathogens not detected by the test.  Clinical correlation with patient history and  other diagnostic information is necessary to determine patient infection status.  The expected result is negative.  Fact Sheet for Patients:   BoilerBrush.com.cyhttps://www.fda.gov/media/136312/download   Fact Sheet for Healthcare Providers:   https://pope.com/https://www.fda.gov/media/136313/download    This test is not yet approved or cleared by the [place] FDA and  has been authorized for detection and/or diagnosis of SARS-CoV-2 by FDA under an Emergency Use Authorization (EUA).  This EUA will remain in effect (mean ing this test can be used)  for the duration of  the COVID-19 declaration under Section 564(b)(1) of the Act, 21 U.S.C. section 360-bbb-3(b)(1), unless the authorization is terminated or revoked sooner.  Performed at [facility] [place], [address] [place], [place] [id number]   Blood Culture (routine x 2)     Status: None   Collection Time: 05/18/20  7:26 PM  Specimen: BLOOD  Result Value Ref Range Status   Specimen Description   Final    BLOOD LEFT ANTECUBITAL Performed at [facility], [address], [place], [place] [address]    Special Requests   Final    BOTTLES DRAWN AEROBIC AND ANAEROBIC Blood Culture adequate volume Performed at [facility], [address] [place], [place] [address]    Culture   Final    NO GROWTH 5 DAYS Performed at [facility], [address] [place], [place] [address]    Report Status 05/23/2020 FINAL  Final  Blood Culture (routine x 2)     Status: None (Preliminary result)   Collection Time: 05/18/20  9:39 PM   Specimen: BLOOD LEFT WRIST  Result Value Ref Range Status   Specimen Description   Final    BLOOD LEFT WRIST Performed at [facility], [address] [place], [place] [address]    Special Requests   Final    BOTTLES DRAWN AEROBIC AND ANAEROBIC Blood Culture adequate volume Performed at [facility], [address] [place], [place] [address]    Culture   Final    NO GROWTH 4 DAYS Performed at [facility], [address] [place], [place] [address]    Report Status PENDING  Incomplete    Radiology Reports DG Chest Portable 1 View  Result Date: 05/18/2020 CLINICAL DATA:  Shortness of breath, rule out COVID. EXAM: PORTABLE CHEST 1 VIEW COMPARISON:  Chest x-ray 09/27/2018. FINDINGS: Sternotomy wires are again noted. The heart size and mediastinal contours are within normal limits. Diffuse hazy airspace opacities that are more prominent in the peripheral left lung. No pulmonary edema. No pleural  effusion. No pneumothorax. No acute osseous abnormality. IMPRESSION: Multifocal pneumonia likely representing known COVID-19 infection Electronically Signed   By: Tish Frederickson M.D.   On: 05/18/2020 20:17     Huey Bienenstock M.D on 05/23/2020 at 2:48 PM    [facility] -  Office  [phone]

## 2020-05-23 NOTE — Progress Notes (Signed)
ANTICOAGULATION CONSULT NOTE  Pharmacy Consult for heparin>>Arixtra Indication: Hx DVT/PE  Allergies  Allergen Reactions   Contrast Media [Iodinated Diagnostic Agents] Itching    Pt states he thinks it makes him itch   Lovenox [Enoxaparin Sodium] Itching    Itching over entire body   Percocet [Oxycodone-Acetaminophen] Hives and Itching    Patient Measurements: Height: 5\' 10"  (177.8 cm) Weight: (!) 143 kg (315 lb 4.1 oz) IBW/kg (Calculated) : 73 Heparin Dosing Weight: 107 kg   Vital Signs: Temp: 98.3 F (36.8 C) (09/25 0721) Temp Source: Oral (09/25 0721) BP: 108/63 (09/25 0429) Pulse Rate: 88 (09/25 0809)  Labs: Recent Labs    05/20/20 0853 05/20/20 0853 05/21/20 0659 05/21/20 0659 05/22/20 1032 05/23/20 0238  HGB 14.6   < > 14.9   < > 15.7 14.3  HCT 44.7   < > 44.3  --  47.4 44.3  PLT 225   < > 271  --  271 278  LABPROT  --   --  15.9*  --  20.8* 24.9*  INR  --   --  1.3*  --  1.9* 2.3*  HEPARINUNFRC 0.42  --   --   --   --   --   CREATININE  --   --  0.97  --  0.90 0.94   < > = values in this interval not displayed.    Estimated Creatinine Clearance: 137.3 mL/min (by C-G formula based on SCr of 0.94 mg/dL).    Assessment: Patient with history of recurrent clots, on long-term warfarin - admission INR = 1.0 (down from 3.4 eight days ago); patient also has itching allergy to lovenox but has received heparin at [facility] facilities in the recent past. Currently on Arixtra for bridge to warfarin. INR today is 2.3 which is therapeutic and trending up. Will discontinue Arixtra today and continue on warfarin monotherapy. No signs of bleeding noted per nurse.  Goal of Therapy:  INR 2-3 Monitor platelets by anticoagulation protocol: Yes   Plan:  Discontinue Arixtra 10mg  SQ q24 Repeat Coumadin 5 mg po x 1 Daily CBC/INR  [facility], PharmD, [facility] Resident [phone] 05/23/2020 8:48 AM

## 2020-05-24 ENCOUNTER — Inpatient Hospital Stay (HOSPITAL_COMMUNITY): Payer: HRSA Program

## 2020-05-24 DIAGNOSIS — R7989 Other specified abnormal findings of blood chemistry: Secondary | ICD-10-CM

## 2020-05-24 LAB — CULTURE, BLOOD (ROUTINE X 2)
Culture: NO GROWTH
Special Requests: ADEQUATE

## 2020-05-24 LAB — CBC WITH DIFFERENTIAL/PLATELET
Abs Immature Granulocytes: 0.21 10*3/uL — ABNORMAL HIGH (ref 0.00–0.07)
Basophils Absolute: 0 10*3/uL (ref 0.0–0.1)
Basophils Relative: 0 %
Eosinophils Absolute: 0 10*3/uL (ref 0.0–0.5)
Eosinophils Relative: 0 %
HCT: 43.6 % (ref 39.0–52.0)
Hemoglobin: 13.9 g/dL (ref 13.0–17.0)
Immature Granulocytes: 1 %
Lymphocytes Relative: 4 %
Lymphs Abs: 0.7 10*3/uL (ref 0.7–4.0)
MCH: 26.6 pg (ref 26.0–34.0)
MCHC: 31.9 g/dL (ref 30.0–36.0)
MCV: 83.4 fL (ref 80.0–100.0)
Monocytes Absolute: 0.9 10*3/uL (ref 0.1–1.0)
Monocytes Relative: 6 %
Neutro Abs: 14.2 10*3/uL — ABNORMAL HIGH (ref 1.7–7.7)
Neutrophils Relative %: 89 %
Platelets: 270 10*3/uL (ref 150–400)
RBC: 5.23 MIL/uL (ref 4.22–5.81)
RDW: 13.7 % (ref 11.5–15.5)
WBC: 16.1 10*3/uL — ABNORMAL HIGH (ref 4.0–10.5)
nRBC: 0 % (ref 0.0–0.2)

## 2020-05-24 LAB — COMPREHENSIVE METABOLIC PANEL
ALT: 23 U/L (ref 0–44)
AST: 22 U/L (ref 15–41)
Albumin: 2.2 g/dL — ABNORMAL LOW (ref 3.5–5.0)
Alkaline Phosphatase: 69 U/L (ref 38–126)
Anion gap: 9 (ref 5–15)
BUN: 19 mg/dL (ref 6–20)
CO2: 24 mmol/L (ref 22–32)
Calcium: 8.1 mg/dL — ABNORMAL LOW (ref 8.9–10.3)
Chloride: 102 mmol/L (ref 98–111)
Creatinine, Ser: 0.95 mg/dL (ref 0.61–1.24)
GFR calc Af Amer: >60 mL/min (ref >60)
GFR calc non Af Amer: >60 mL/min (ref >60)
Glucose, Bld: 176 mg/dL — ABNORMAL HIGH (ref 70–99)
Potassium: 4.3 mmol/L (ref 3.5–5.1)
Sodium: 135 mmol/L (ref 135–145)
Total Bilirubin: 0.6 mg/dL (ref 0.3–1.2)
Total Protein: 5.7 g/dL — ABNORMAL LOW (ref 6.5–8.1)

## 2020-05-24 LAB — PROTIME-INR
INR: 3.2 — ABNORMAL HIGH (ref 0.8–1.2)
Prothrombin Time: 31.7 seconds — ABNORMAL HIGH (ref 11.4–15.2)

## 2020-05-24 LAB — D-DIMER, QUANTITATIVE: D-Dimer, Quant: >20 ug/mL-FEU — ABNORMAL HIGH (ref 0.00–0.50)

## 2020-05-24 LAB — GLUCOSE, CAPILLARY
Glucose-Capillary: 184 mg/dL — ABNORMAL HIGH (ref 70–99)
Glucose-Capillary: 302 mg/dL — ABNORMAL HIGH (ref 70–99)
Glucose-Capillary: 301 mg/dL — ABNORMAL HIGH (ref 70–99)
Glucose-Capillary: 367 mg/dL — ABNORMAL HIGH (ref 70–99)

## 2020-05-24 MED ORDER — DIPHENHYDRAMINE HCL 25 MG PO CAPS: 50.0000 mg | ORAL_CAPSULE | Freq: Once | ORAL | Status: AC

## 2020-05-24 MED ORDER — DIPHENHYDRAMINE HCL 25 MG PO CAPS: 50.0000 mg | ORAL_CAPSULE | Freq: Once | ORAL | Status: DC

## 2020-05-24 MED ORDER — WARFARIN SODIUM 2.5 MG PO TABS
2.5000 mg | ORAL_TABLET | Freq: Once | ORAL | Status: AC
  Administered 2020-05-24: 2.5 mg via ORAL
  Filled 2020-05-24: qty 1

## 2020-05-24 MED ORDER — DIPHENHYDRAMINE HCL 50 MG/ML IJ SOLN
50.0000 mg | Freq: Once | INTRAMUSCULAR | Status: AC
  Administered 2020-05-25: 50 mg via INTRAVENOUS
  Filled 2020-05-24: qty 1

## 2020-05-24 MED ORDER — INSULIN GLARGINE 100 UNIT/ML ~~LOC~~ SOLN
85.0000 [IU] | Freq: Every day | SUBCUTANEOUS | Status: DC
  Administered 2020-05-25 – 2020-05-29 (×5): 85 [IU] via SUBCUTANEOUS
  Filled 2020-05-24 (×5): qty 0.85

## 2020-05-24 MED ORDER — PREDNISONE 20 MG PO TABS
50.0000 mg | ORAL_TABLET | Freq: Four times a day (QID) | ORAL | Status: AC
  Administered 2020-05-24 – 2020-05-25 (×3): 50 mg via ORAL
  Filled 2020-05-24 (×3): qty 2

## 2020-05-24 MED ORDER — DIPHENHYDRAMINE HCL 50 MG/ML IJ SOLN: 50.0000 mg | Freq: Once | INTRAMUSCULAR | Status: DC

## 2020-05-24 MED ORDER — PANTOPRAZOLE SODIUM 40 MG PO TBEC
40.0000 mg | DELAYED_RELEASE_TABLET | Freq: Two times a day (BID) | ORAL | Status: DC
  Administered 2020-05-24 – 2020-05-29 (×10): 40 mg via ORAL
  Filled 2020-05-24 (×10): qty 1

## 2020-05-24 MED ORDER — INSULIN GLARGINE 100 UNIT/ML ~~LOC~~ SOLN
60.0000 [IU] | Freq: Every day | SUBCUTANEOUS | Status: DC
  Administered 2020-05-24: 60 [IU] via SUBCUTANEOUS
  Filled 2020-05-24 (×3): qty 0.6

## 2020-05-24 NOTE — Progress Notes (Signed)
PROGRESS NOTE                                                                                                                                                                                                             Patient Demographics:    Marcus Joseph, is a 49 y.o. male, DOB - 12-15-1970, UJW:[id number]  Admit date - 05/18/2020   Admitting Physician Carlton Adam, MD  Outpatient Primary MD for the patient is [facility]  LOS - 5   Chief Complaint  Patient presents with  . Shortness of Breath       Brief Narrative     Marcus Joseph. is a 49 y.o. 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  He presented to [facility] emergency room 2 days ago with complaint of shortness of breath and not feeling well.  's work-up was significant for COVID-19 pneumonia, and severe hypoxia, for which she is requiring heated high flow nasal cannula, he was admitted to [facility] for further management, as well he was noted to have significantly elevated hyperglycemia with A1c of 14.4. .    Subjective:    [facility] today reports dyspnea, cough, no further epistaxis.   Assessment  & Plan :    Active Problems:   History of recurrent pulmonary embolism   Morbid obesity (HCC)   Long term (current) use of anticoagulants   Pneumonia due to COVID-19 virus   Diabetes mellitus with hyperglycemia (HCC)   Hypoxia  Acute hypoxic respiratory failure due to COVID-19 pneumonia -Patient is unvaccinated. -Chest x-ray significant for multifocal pneumonia bilaterally. -He remains with severe COVID-19 pneumonia, with significant lung injury due to COVID-19, remains with significant oxygen requirement, morning he is on 20 L heated high flow nasal cannula .. -Patient was encouraged to use incentive spirometry and  flutter valve, and he was able to do modified proning overnight, out of bed to chair. -Continue with IV steroids -Continue with IV remdesivir. -started on Baricitinib. -Continue to trend inflammatory markers. -procalcitonin 0.42, Started on IV Rocephin and azithromycin.   SpO2: 98 % O2 Flow Rate (L/min): 20 L/min FiO2 (%): 70 %     COVID-19 Labs  Recent Labs    05/22/20  1032 05/23/20 0238 05/24/20 0350  DDIMER 1.66* 1.96* >20.00*    Lab Results  Component Value Date   SARSCOV2NAA POSITIVE (A) 05/18/2020   History of recurrent PE -He is on warfarin, with subtherapeutic INR on presentation, report he has been compliant with his warfarin, reports he failed Xarelto as outpatient in the past. -Dimer is extremely elevated today>20, he is currently off Arixtra, venous Doppler this morning with no evidence of DVT, I will go ahead and obtain CTA chest(has to be premedicated with prednisone given contrast allergy), if he has acute PE then we have to start on Arixtra or less to his INR readings, if negative then will transition to Arixtra once INR level less than 2.  Morbid obesity (HCC)  - Long term (current) use of anticoagulants  Diabetes mellitus -Very poorly controlled controlled, A1c of 14.4 -Started on Tradjenta. -Controlled, but remains elevated, will increase morning Lantus to 85 units, and evening Lantus to 55 units . Continue with sliding scale and premeal NovoLog .  Hypertension -Soft blood pressure, hold Cozaar  Epistaxis - resolved  Code Status : Full  Family communication: patient is appropriate, coherent, he was updated in detail, all his questions answered. patient reports he will update them himself.  Disposition Plan  :  Status is: Inpatient  Remains inpatient appropriate because:IV treatments appropriate due to intensity of illness or inability to take PO   Dispo: The patient is from: Home              Anticipated d/c is to: Home               Anticipated d/c date is: > 3 days              Patient currently is not medically stable to d/c.    Consults  :  None  Procedures  : none  DVT Prophylaxis  :  Heparin >>Arixtra(till INR is therapeutic).  Lab Results  Component Value Date   PLT 270 05/24/2020    Antibiotics  :    Anti-infectives (From admission, onward)   Start     Dose/Rate Route Frequency Ordered Stop   05/21/20 1300  cefTRIAXone (ROCEPHIN) 2 g in sodium chloride 0.9 % 100 mL IVPB        2 g 200 mL/hr over 30 Minutes Intravenous Every 24 hours 05/21/20 0901 05/24/20 1246   05/21/20 1230  cefTRIAXone (ROCEPHIN) 2 g in sodium chloride 0.9 % 100 mL IVPB  Status:  Discontinued        2 g 200 mL/hr over 30 Minutes Intravenous Every 24 hours 05/20/20 1346 05/20/20 1349   05/21/20 1000  remdesivir 100 mg in sodium chloride 0.9 % 100 mL IVPB  Status:  Discontinued       "Followed by" Linked Group Details   100 mg 200 mL/hr over 30 Minutes Intravenous Daily 05/20/20 0139 05/20/20 0142   05/20/20 1349  cefTRIAXone (ROCEPHIN) 2 g in sodium chloride 0.9 % 100 mL IVPB  Status:  Discontinued        2 g 200 mL/hr over 30 Minutes Intravenous Every 24 hours 05/20/20 1349 05/21/20 0901   05/20/20 1300  azithromycin (ZITHROMAX) 500 mg in sodium chloride 0.9 % 250 mL IVPB        500 mg 250 mL/hr over 60 Minutes Intravenous Every 24 hours 05/20/20 1155 05/24/20 1402   05/20/20 1230  cefTRIAXone (ROCEPHIN) 1 g in sodium chloride 0.9 % 100 mL IVPB  Status:  Discontinued  1 g 200 mL/hr over 30 Minutes Intravenous Every 24 hours 05/20/20 1155 05/20/20 1346   05/20/20 0139  remdesivir 200 mg in sodium chloride 0.9% 250 mL IVPB  Status:  Discontinued       "Followed by" Linked Group Details   200 mg 580 mL/hr over 30 Minutes Intravenous Once 05/20/20 0139 05/20/20 0142   05/19/20 1000  remdesivir 100 mg in sodium chloride 0.9 % 100 mL IVPB       "Followed by" Linked Group Details   100 mg 200 mL/hr over 30 Minutes  Intravenous Daily 05/18/20 2059 05/22/20 2103   05/18/20 2130  remdesivir 100 mg in sodium chloride 0.9 % 100 mL IVPB       "Followed by" Linked Group Details   100 mg 200 mL/hr over 30 Minutes Intravenous  Once 05/18/20 2059 05/18/20 2242   05/18/20 2100  remdesivir 100 mg in sodium chloride 0.9 % 100 mL IVPB       "Followed by" Linked Group Details   100 mg 200 mL/hr over 30 Minutes Intravenous  Once 05/18/20 2059 05/18/20 2205        Objective:   Vitals:   05/24/20 0322 05/24/20 0342 05/24/20 0750 05/24/20 1149  BP:  (!) 106/32 (!) 108/47 (!) 110/59  Pulse: (!) 52 60 (!) 50 (!) 58  Resp: (!) 22 20 20    Temp:  98.1 F (36.7 C) 98.2 F (36.8 C) 98 F (36.7 C)  TempSrc:  Oral Axillary Oral  SpO2: 92% (!) 89% 100% 98%  Weight:      Height:        Wt Readings from Last 3 Encounters:  05/20/20 (!) 143 kg  01/23/20 (!) 141.5 kg  10/30/19 (!) 142.9 kg     Intake/Output Summary (Last 24 hours) at 05/24/2020 1510 Last data filed at 05/24/2020 1300 Gross per 24 hour  Intake 840 ml  Output 690 ml  Net 150 ml     Physical Exam  Awake Alert, Oriented X 3, No new F.N deficits, Normal affect Symmetrical Chest wall movement, Good air movement bilaterally, CTAB RRR,No Gallops,Rubs or new Murmurs, No Parasternal Heave +ve B.Sounds, Abd Soft, No tenderness, No rebound - guarding or rigidity. No Cyanosis, Clubbing or edema, No new Rash or bruise       Data Review:    CBC Recent Labs  Lab 05/18/20 1925 05/18/20 1925 05/20/20 0853 05/21/20 0659 05/22/20 1032 05/23/20 0238 05/24/20 0350  WBC 5.7   < > 11.9* 13.9* 14.5* 14.1* 16.1*  HGB 14.4   < > 14.6 14.9 15.7 14.3 13.9  HCT 44.0   < > 44.7 44.3 47.4 44.3 43.6  PLT 173   < > 225 271 271 278 270  MCV 83.5   < > 83.1 82.6 83.0 83.1 83.4  MCH 27.3   < > 27.1 27.8 27.5 26.8 26.6  MCHC 32.7   < > 32.7 33.6 33.1 32.3 31.9  RDW 13.8   < > 13.8 13.9 14.0 13.8 13.7  LYMPHSABS 0.8  --   --  1.3 1.3 1.1 0.7  MONOABS 0.4   --   --  0.7 0.6 0.7 0.9  EOSABS 0.0  --   --  0.1 0.0 0.0 0.0  BASOSABS 0.0  --   --  0.1 0.1 0.0 0.0   < > = values in this interval not displayed.    Chemistries  Recent Labs  Lab 05/18/20 1925 05/21/20 05/23/20 05/22/20 1032 05/23/20 0238 05/24/20 0350  NA 127* 133* 136 131* 135  K 5.1 4.7 4.0 4.2 4.3  CL 91* 97* 102 99 102  CO2 21* 21* 21* 22 24  GLUCOSE 553* 182* 202* 332* 176*  BUN 19 27* 22* 23* 19  CREATININE 1.11 0.97 0.90 0.94 0.95  CALCIUM 8.4* 8.0* 8.0* 7.8* 8.1*  AST 96* 54* 36 30 22  ALT 68* 38 34 29 23  ALKPHOS 60 63 71 73 69  BILITOT 1.0 0.7 0.6 0.7 0.6   ------------------------------------------------------------------------------------------------------------------ No results for input(s): CHOL, HDL, LDLCALC, TRIG, CHOLHDL, LDLDIRECT in the last 72 hours.  Lab Results  Component Value Date   HGBA1C 14.4 (H) 05/19/2020   ------------------------------------------------------------------------------------------------------------------ No results for input(s): TSH, T4TOTAL, T3FREE, THYROIDAB in the last 72 hours.  Invalid input(s): FREET3 ------------------------------------------------------------------------------------------------------------------ No results for input(s): VITAMINB12, FOLATE, FERRITIN, TIBC, IRON, RETICCTPCT in the last 72 hours.  Coagulation profile Recent Labs  Lab 05/19/20 0807 05/21/20 0659 05/22/20 1032 05/23/20 0238 05/24/20 0350  INR 1.0 1.3* 1.9* 2.3* 3.2*    Recent Labs    05/23/20 0238 05/24/20 0350  DDIMER 1.96* >20.00*    Cardiac Enzymes No results for input(s): CKMB, TROPONINI, MYOGLOBIN in the last 168 hours.  Invalid input(s): CK ------------------------------------------------------------------------------------------------------------------    Component Value Date/Time   BNP 14.5 06/07/2015 1455    Inpatient Medications  Scheduled Meds: . aspirin EC  81 mg Oral Daily  . baricitinib  4 mg Oral  Daily  . insulin aspart  0-20 Units Subcutaneous TID WC  . insulin aspart  0-5 Units Subcutaneous QHS  . insulin aspart  12 Units Subcutaneous TID WC  . insulin glargine  50 Units Subcutaneous QHS  . insulin glargine  75 Units Subcutaneous Daily  . linagliptin  5 mg Oral Daily  . methylPREDNISolone (SOLU-MEDROL) injection  60 mg Intravenous Q6H  . pantoprazole  40 mg Oral Daily  . Warfarin - Pharmacist Dosing Inpatient   Does not apply q1600   Continuous Infusions:  PRN Meds:.acetaminophen, albuterol, guaiFENesin-dextromethorphan, senna-docusate, sodium chloride  Micro Results Recent Results (from the past 240 hour(s))  SARS Coronavirus 2 by RT PCR (hospital order, performed in [facility] hospital lab) Nasopharyngeal Nasopharyngeal Swab     Status: Abnormal   Collection Time: 05/18/20  6:51 PM   Specimen: Nasopharyngeal Swab  Result Value Ref Range Status   SARS Coronavirus 2 POSITIVE (A) NEGATIVE Final    Comment: RESULT CALLED TO, READ BACK BY AND VERIFIED WITH: CALLED TO K.NEAL RN AT 1938 BY SROY ON [id number] (NOTE) SARS-CoV-2 target nucleic acids are DETECTED  SARS-CoV-2 RNA is generally detectable in upper respiratory specimens  during the acute phase of infection.  Positive results are indicative  of the presence of the identified virus, but do not rule out bacterial infection or co-infection with other pathogens not detected by the test.  Clinical correlation with patient history and  other diagnostic information is necessary to determine patient infection status.  The expected result is negative.  Fact Sheet for Patients:   BoilerBrush.com.cy   Fact Sheet for Healthcare Providers:   https://pope.com/    This test is not yet approved or cleared by the [place] FDA and  has been authorized for detection and/or diagnosis of SARS-CoV-2 by FDA under an Emergency Use Authorization (EUA).  This EUA will remain in effect  (mean ing this test can be used) for the duration of  the COVID-19 declaration under Section 564(b)(1) of the Act, 21 U.S.C. section 360-bbb-3(b)(1), unless the authorization is  terminated or revoked sooner.  Performed at [facility], [address] [place], [place] [address]   Blood Culture (routine x 2)     Status: None   Collection Time: 05/18/20  7:26 PM   Specimen: BLOOD  Result Value Ref Range Status   Specimen Description   Final    BLOOD LEFT ANTECUBITAL Performed at [facility], [address], [place], [place] [address]    Special Requests   Final    BOTTLES DRAWN AEROBIC AND ANAEROBIC Blood Culture adequate volume Performed at [facility], [address] [place], [place] [address]    Culture   Final    NO GROWTH 5 DAYS Performed at [facility], [address] [place], [place] [address]    Report Status 05/23/2020 FINAL  Final  Blood Culture (routine x 2)     Status: None   Collection Time: 05/18/20  9:39 PM   Specimen: BLOOD LEFT WRIST  Result Value Ref Range Status   Specimen Description   Final    BLOOD LEFT WRIST Performed at [facility], [address] [place], [place] [address]    Special Requests   Final    BOTTLES DRAWN AEROBIC AND ANAEROBIC Blood Culture adequate volume Performed at [facility], [address] [place], [place] [address]    Culture   Final    NO GROWTH 5 DAYS Performed at [facility], [address], [place], [place] [address]    Report Status 05/24/2020 FINAL  Final    Radiology Reports DG Chest Portable 1 View  Result Date: 05/18/2020 CLINICAL DATA:  Shortness of breath, rule out COVID. EXAM: PORTABLE CHEST 1 VIEW COMPARISON:  Chest x-ray 09/27/2018. FINDINGS: Sternotomy wires are again noted. The heart size and mediastinal contours are within normal limits. Diffuse hazy airspace opacities that are more prominent in the peripheral left lung. No pulmonary edema.  No pleural effusion. No pneumothorax. No acute osseous abnormality. IMPRESSION: Multifocal pneumonia likely representing known COVID-19 infection Electronically Signed   By: Tish Frederickson M.D.   On: 05/18/2020 20:17   VAS [place] LOWER EXTREMITY VENOUS (DVT)  Result Date: 05/24/2020  Lower Venous DVTStudy Indications: Elevated d-dimer.  Risk Factors: DVT Personal history. Anticoagulation: Coumadin. Limitations: Poor ultrasound/tissue interface. Comparison Study: 01-24-2020 Prior LT lower venous                   08-19-2018 Prior RT lower venous Performing Technologist: Jean Rosenthal  Examination Guidelines: A complete evaluation includes B-mode imaging, spectral Doppler, color Doppler, and power Doppler as needed of all accessible portions of each vessel. Bilateral testing is considered an integral part of a complete examination. Limited examinations for reoccurring indications may be performed as noted. The reflux portion of the exam is performed with the patient in reverse Trendelenburg.  +---------+---------------+---------+-----------+----------+---------------+ RIGHT    CompressibilityPhasicitySpontaneityPropertiesThrombus Aging  +---------+---------------+---------+-----------+----------+---------------+ CFV      Full           Yes      Yes                                  +---------+---------------+---------+-----------+----------+---------------+ SFJ      Full                                                         +---------+---------------+---------+-----------+----------+---------------+  FV Prox  Full                                                         +---------+---------------+---------+-----------+----------+---------------+ FV Mid   Full                                                         +---------+---------------+---------+-----------+----------+---------------+ FV DistalFull                                                          +---------+---------------+---------+-----------+----------+---------------+ PFV      Full                                                         +---------+---------------+---------+-----------+----------+---------------+ POP      Full           Yes      Yes                                  +---------+---------------+---------+-----------+----------+---------------+ PTV      Full                                                         +---------+---------------+---------+-----------+----------+---------------+ PERO                                                  Patent by color +---------+---------------+---------+-----------+----------+---------------+ Gastroc  Full                                                         +---------+---------------+---------+-----------+----------+---------------+   +---------+---------------+---------+-----------+----------+-------------------+ LEFT     CompressibilityPhasicitySpontaneityPropertiesThrombus Aging      +---------+---------------+---------+-----------+----------+-------------------+ CFV      Full           Yes      Yes                                      +---------+---------------+---------+-----------+----------+-------------------+ SFJ      Full                                                             +---------+---------------+---------+-----------+----------+-------------------+  FV Prox  Full                                                             +---------+---------------+---------+-----------+----------+-------------------+ FV Mid   Full                                                             +---------+---------------+---------+-----------+----------+-------------------+ FV DistalFull                                                             +---------+---------------+---------+-----------+----------+-------------------+ PFV      Full                                                              +---------+---------------+---------+-----------+----------+-------------------+ POP      Full           Yes      Yes                                      +---------+---------------+---------+-----------+----------+-------------------+ PTV      Full                                                             +---------+---------------+---------+-----------+----------+-------------------+ PERO     Full                                         Some segments not                                                         well visualized     +---------+---------------+---------+-----------+----------+-------------------+ Gastroc  Full                                                             +---------+---------------+---------+-----------+----------+-------------------+    Summary: RIGHT: - There is no evidence of deep vein thrombosis in the lower extremity. However, portions of this examination were limited- see technologist comments above.  -  No cystic structure found in the popliteal fossa.  LEFT: - There is no evidence of deep vein thrombosis in the lower extremity. However, portions of this examination were limited- see technologist comments above.  - No cystic structure found in the popliteal fossa.  *See table(s) above for measurements and observations.    Preliminary      Huey Bienenstock M.D on 05/24/2020 at 3:10 PM    [facility] -  Office  [phone]

## 2020-05-24 NOTE — Progress Notes (Signed)
ANTICOAGULATION CONSULT NOTE  Pharmacy Consult for heparin>>Arixtra Indication: Hx DVT/PE  Allergies  Allergen Reactions  . Contrast Media [Iodinated Diagnostic Agents] Itching    Pt states he thinks it makes him itch  . Lovenox [Enoxaparin Sodium] Itching    Itching over entire body  . Percocet [Oxycodone-Acetaminophen] Hives and Itching    Patient Measurements: Height: 5\' 10"  (177.8 cm) Weight: (!) 143 kg (315 lb 4.1 oz) IBW/kg (Calculated) : 73 Heparin Dosing Weight: 107 kg   Vital Signs: Temp: 98.2 F (36.8 C) (09/26 0750) Temp Source: Axillary (09/26 0750) BP: 108/47 (09/26 0750) Pulse Rate: 50 (09/26 0750)  Labs: Recent Labs    05/22/20 1032 05/22/20 1032 05/23/20 0238 05/24/20 0350  HGB 15.7   < > 14.3 13.9  HCT 47.4  --  44.3 43.6  PLT 271  --  278 270  LABPROT 20.8*  --  24.9* 31.7*  INR 1.9*  --  2.3* 3.2*  CREATININE 0.90  --  0.94 0.95   < > = values in this interval not displayed.    Estimated Creatinine Clearance: 135.8 mL/min (by C-G formula based on SCr of 0.95 mg/dL).    Assessment: Patient with history of recurrent clots, on long-term warfarin - admission INR = 1.0 (down from 3.4 eight days ago); patient also has itching allergy to lovenox but has received heparin at [facility] facilities in the recent past. Discontinued Arixtra on 9/25. INR today is 3.2 which is therapeutic and significantly increased from 9/25. No signs of bleeding noted per nurse. D-dimer is >20 today which is uptrending. Due to supratherapeutic warfarin will reduce dose today. CBC is stable.  Goal of Therapy:  INR 2-3 Monitor platelets by anticoagulation protocol: Yes   Plan:  Warfarin 2.5mg  x1  Daily CBC/INR Monitor closely for s/sx and bleeding   10/25, PharmD, MBA Pharmacy Resident [phone] 05/24/2020 9:15 AM

## 2020-05-24 NOTE — Progress Notes (Addendum)
Lower extremity venous study completed.  Preliminary results relayed to [facility], MD.  See CV Proc for preliminary results report.   Jean Rosenthal, RDMS

## 2020-05-25 ENCOUNTER — Inpatient Hospital Stay (HOSPITAL_COMMUNITY): Payer: HRSA Program

## 2020-05-25 DIAGNOSIS — R0902 Hypoxemia: Secondary | ICD-10-CM

## 2020-05-25 LAB — CBC WITH DIFFERENTIAL/PLATELET
Abs Immature Granulocytes: 0.25 K/uL — ABNORMAL HIGH (ref 0.00–0.07)
Basophils Absolute: 0.1 K/uL (ref 0.0–0.1)
Basophils Relative: 0 %
Eosinophils Absolute: 0 K/uL (ref 0.0–0.5)
Eosinophils Relative: 0 %
HCT: 44.5 % (ref 39.0–52.0)
Hemoglobin: 14.5 g/dL (ref 13.0–17.0)
Immature Granulocytes: 2 %
Lymphocytes Relative: 4 %
Lymphs Abs: 0.6 K/uL — ABNORMAL LOW (ref 0.7–4.0)
MCH: 27.4 pg (ref 26.0–34.0)
MCHC: 32.6 g/dL (ref 30.0–36.0)
MCV: 84 fL (ref 80.0–100.0)
Monocytes Absolute: 0.7 K/uL (ref 0.1–1.0)
Monocytes Relative: 4 %
Neutro Abs: 15.4 K/uL — ABNORMAL HIGH (ref 1.7–7.7)
Neutrophils Relative %: 90 %
Platelets: 245 K/uL (ref 150–400)
RBC: 5.3 MIL/uL (ref 4.22–5.81)
RDW: 13.7 % (ref 11.5–15.5)
WBC: 17 K/uL — ABNORMAL HIGH (ref 4.0–10.5)
nRBC: 0 % (ref 0.0–0.2)

## 2020-05-25 LAB — COMPREHENSIVE METABOLIC PANEL
ALT: 21 U/L (ref 0–44)
AST: 24 U/L (ref 15–41)
Albumin: 2.1 g/dL — ABNORMAL LOW (ref 3.5–5.0)
Alkaline Phosphatase: 62 U/L (ref 38–126)
Anion gap: 9 (ref 5–15)
BUN: 17 mg/dL (ref 6–20)
CO2: 20 mmol/L — ABNORMAL LOW (ref 22–32)
Calcium: 8 mg/dL — ABNORMAL LOW (ref 8.9–10.3)
Chloride: 107 mmol/L (ref 98–111)
Creatinine, Ser: 0.83 mg/dL (ref 0.61–1.24)
GFR calc Af Amer: >60 mL/min (ref >60)
GFR calc non Af Amer: >60 mL/min (ref >60)
Glucose, Bld: 90 mg/dL (ref 70–99)
Potassium: 4.5 mmol/L (ref 3.5–5.1)
Sodium: 136 mmol/L (ref 135–145)
Total Bilirubin: 0.8 mg/dL (ref 0.3–1.2)
Total Protein: 5.5 g/dL — ABNORMAL LOW (ref 6.5–8.1)

## 2020-05-25 LAB — PROTIME-INR
INR: 3.8 — ABNORMAL HIGH (ref 0.8–1.2)
Prothrombin Time: 36.4 seconds — ABNORMAL HIGH (ref 11.4–15.2)

## 2020-05-25 LAB — GLUCOSE, CAPILLARY
Glucose-Capillary: 86 mg/dL (ref 70–99)
Glucose-Capillary: 204 mg/dL — ABNORMAL HIGH (ref 70–99)
Glucose-Capillary: 234 mg/dL — ABNORMAL HIGH (ref 70–99)
Glucose-Capillary: 259 mg/dL — ABNORMAL HIGH (ref 70–99)

## 2020-05-25 LAB — D-DIMER, QUANTITATIVE: D-Dimer, Quant: >20 ug/mL-FEU — ABNORMAL HIGH (ref 0.00–0.50)

## 2020-05-25 MED ORDER — INSULIN GLARGINE 100 UNIT/ML ~~LOC~~ SOLN
40.0000 [IU] | Freq: Every day | SUBCUTANEOUS | Status: DC
  Administered 2020-05-25: 40 [IU] via SUBCUTANEOUS
  Filled 2020-05-25 (×2): qty 0.4

## 2020-05-25 MED ORDER — INSULIN GLARGINE 100 UNIT/ML ~~LOC~~ SOLN
45.0000 [IU] | Freq: Every day | SUBCUTANEOUS | Status: DC
  Filled 2020-05-25: qty 0.45

## 2020-05-25 MED ORDER — METHYLPREDNISOLONE SODIUM SUCC 125 MG IJ SOLR
60.0000 mg | Freq: Three times a day (TID) | INTRAMUSCULAR | Status: DC
  Administered 2020-05-25 – 2020-05-27 (×6): 60 mg via INTRAVENOUS
  Filled 2020-05-25 (×6): qty 2

## 2020-05-25 MED ORDER — IOHEXOL 350 MG/ML SOLN
100.0000 mL | Freq: Once | INTRAVENOUS | Status: AC | PRN
  Administered 2020-05-25: 100 mL via INTRAVENOUS

## 2020-05-25 MED ORDER — FUROSEMIDE 10 MG/ML IJ SOLN
40.0000 mg | Freq: Once | INTRAMUSCULAR | Status: AC
  Administered 2020-05-25: 40 mg via INTRAVENOUS
  Filled 2020-05-25: qty 4

## 2020-05-25 NOTE — Plan of Care (Signed)
  Problem: Education: Goal: Knowledge of risk factors and measures for prevention of condition will improve Outcome: Progressing   Problem: Coping: Goal: Psychosocial and spiritual needs will be supported Outcome: Progressing   Problem: Respiratory: Goal: Will maintain a patent airway Outcome: Progressing Goal: Complications related to the disease process, condition or treatment will be avoided or minimized Outcome: Progressing   Problem: Education: Goal: Knowledge of General Education information will improve Description: Including pain rating scale, medication(s)/side effects and non-pharmacologic comfort measures Outcome: Progressing   Problem: Health Behavior/Discharge Planning: Goal: Ability to manage health-related needs will improve Outcome: Progressing   Problem: Clinical Measurements: Goal: Ability to maintain clinical measurements within normal limits will improve Outcome: Progressing Goal: Will remain free from infection Outcome: Progressing Goal: Respiratory complications will improve Outcome: Progressing   Problem: Activity: Goal: Risk for activity intolerance will decrease Outcome: Progressing   Problem: Coping: Goal: Level of anxiety will decrease Outcome: Progressing   Problem: Elimination: Goal: Will not experience complications related to bowel motility Outcome: Progressing   Problem: Safety: Goal: Ability to remain free from injury will improve Outcome: Progressing   Problem: Skin Integrity: Goal: Risk for impaired skin integrity will decrease Outcome: Progressing   

## 2020-05-25 NOTE — Progress Notes (Signed)
PROGRESS NOTE                                                                                                                                                                                                             Patient Demographics:    Marcus Joseph, is a 49 y.o. male, DOB - June 25, 1971, ZOX:[id number]  Admit date - 05/18/2020   Admitting Physician Marcus AdamBradley S Chotiner, MD  Outpatient Primary MD for the patient is [facility]  LOS - 6   Chief Complaint  Patient presents with  . Shortness of Breath       Brief Narrative     Marcus SpillerWilliam L Wethington Jr. is a 49 y.o. 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  He [facility] 2 days ago with complaint of shortness of breath and not feeling well.  's work-up was significant for COVID-19 pneumonia, and severe hypoxia, for which she is requiring heated high flow nasal cannula, [facility] was [facility], as well he was noted to have significantly elevated hyperglycemia with A1c of 14.4. .    Subjective:    Marcus Joseph today ports his dyspnea has improved, cough has improved as well, he denies any chest pain.   Assessment  & Plan :    Active Problems:   History of recurrent pulmonary embolism   Morbid obesity (HCC)   Long term (current) use of anticoagulants   Pneumonia due to COVID-19 virus   Diabetes mellitus with hyperglycemia (HCC)   Hypoxia  Acute hypoxic respiratory failure due to COVID-19 pneumonia -Patient is unvaccinated. -Chest x-ray significant for multifocal pneumonia bilaterally. -He remains with severe COVID-19 pneumonia, with significant lung injury due to COVID-19, remains with significant oxygen requirement, he is currently on 20 L heated high flow nasal cannula 70%. -Patient was  encouraged to use incentive spirometry and flutter valve, and he was able to do modified proning overnight, out of bed to chair. -Continue with IV steroids -Continue with IV remdesivir. -started on Baricitinib. -Continue to trend inflammatory markers. -procalcitonin 0.42, Started on IV Rocephin and azithromycin. -Lasix as needed, will give 40 mg of IV Lasix today.   SpO2: 93 % O2 Flow Rate (  L/min): 20 L/min FiO2 (%): 70 %     COVID-19 Labs  Recent Labs    05/23/20 0238 05/24/20 0350 05/25/20 0840  DDIMER 1.96* >20.00* >20.00*    Lab Results  Component Value Date   SARSCOV2NAA POSITIVE (A) 05/18/2020   History of recurrent PE/left ventricular thrombus -He is on warfarin, with subtherapeutic INR on presentation, report he has been compliant with his warfarin, reports he failed Xarelto as outpatient in the past. -INR subtherapeutic on presentation, initially on heparin drip, then Arixtra bridge until INR became therapeutic. -D-dimers>20, despite therapeutic INR, venous Doppler with no DVT on 9/26, CTA with no PE on 9/28, but given significantly elevated D-dimers, will change his anticoagulation from warfarin to Arixtra during hospital stay, will await INR to drift less than 2.5 before initiating Arixtra.  Morbid obesity (HCC)  - Long term (current) use of anticoagulants  Diabetes mellitus -Very poorly controlled controlled, A1c of 14.4 -Started on Tradjenta. -This morning CBG of 85, so I will decrease evening dose Lantus to 40 units, but continue with 85 units daytime.  Continue with sliding scale and premeal NovoLog .  Hypertension -Soft blood pressure, hold Cozaar  Epistaxis - resolved  Code Status : Full  Family communication: patient is appropriate, coherent, he was updated in detail, all his questions answered. patient reports he will update them himself.  Disposition Plan  :  Status is: Inpatient  Remains inpatient appropriate because:IV treatments  appropriate due to intensity of illness or inability to take PO   Dispo: The patient is from: Home              Anticipated d/c is to: Home              Anticipated d/c date is: > 3 days              Patient currently is not medically stable to d/c.    Consults  :  None  Procedures  : none  DVT Prophylaxis  :  Heparin >>Arixtra(till INR is therapeutic).  Lab Results  Component Value Date   PLT 245 05/25/2020    Antibiotics  :    Anti-infectives (From admission, onward)   Start     Dose/Rate Route Frequency Ordered Stop   05/21/20 1300  cefTRIAXone (ROCEPHIN) 2 g in sodium chloride 0.9 % 100 mL IVPB        2 g 200 mL/hr over 30 Minutes Intravenous Every 24 hours 05/21/20 0901 05/24/20 1246   05/21/20 1230  cefTRIAXone (ROCEPHIN) 2 g in sodium chloride 0.9 % 100 mL IVPB  Status:  Discontinued        2 g 200 mL/hr over 30 Minutes Intravenous Every 24 hours 05/20/20 1346 05/20/20 1349   05/21/20 1000  remdesivir 100 mg in sodium chloride 0.9 % 100 mL IVPB  Status:  Discontinued       "Followed by" Linked Group Details   100 mg 200 mL/hr over 30 Minutes Intravenous Daily 05/20/20 0139 05/20/20 0142   05/20/20 1349  cefTRIAXone (ROCEPHIN) 2 g in sodium chloride 0.9 % 100 mL IVPB  Status:  Discontinued        2 g 200 mL/hr over 30 Minutes Intravenous Every 24 hours 05/20/20 1349 05/21/20 0901   05/20/20 1300  azithromycin (ZITHROMAX) 500 mg in sodium chloride 0.9 % 250 mL IVPB        500 mg 250 mL/hr over 60 Minutes Intravenous Every 24 hours 05/20/20 1155 05/24/20 1402   05/20/20  1230  cefTRIAXone (ROCEPHIN) 1 g in sodium chloride 0.9 % 100 mL IVPB  Status:  Discontinued        1 g 200 mL/hr over 30 Minutes Intravenous Every 24 hours 05/20/20 1155 05/20/20 1346   05/20/20 0139  remdesivir 200 mg in sodium chloride 0.9% 250 mL IVPB  Status:  Discontinued       "Followed by" Linked Group Details   200 mg 580 mL/hr over 30 Minutes Intravenous Once 05/20/20 0139 05/20/20 0142    05/19/20 1000  remdesivir 100 mg in sodium chloride 0.9 % 100 mL IVPB       "Followed by" Linked Group Details   100 mg 200 mL/hr over 30 Minutes Intravenous Daily 05/18/20 2059 05/22/20 2103   05/18/20 2130  remdesivir 100 mg in sodium chloride 0.9 % 100 mL IVPB       "Followed by" Linked Group Details   100 mg 200 mL/hr over 30 Minutes Intravenous  Once 05/18/20 2059 05/18/20 2242   05/18/20 2100  remdesivir 100 mg in sodium chloride 0.9 % 100 mL IVPB       "Followed by" Linked Group Details   100 mg 200 mL/hr over 30 Minutes Intravenous  Once 05/18/20 2059 05/18/20 2205        Objective:   Vitals:   05/25/20 0734 05/25/20 0854 05/25/20 1155 05/25/20 1349  BP: (!) 107/52  97/65   Pulse: (!) 55 68 62   Resp: Temp: 98.2 F (36.8 C)  97.8 F (36.6 C)   TempSrc: Axillary  Oral   SpO2: 97% 95% 92% 93%  Weight:      Height:        Wt Readings from Last 3 Encounters:  05/25/20 (!) 137 kg  01/23/20 (!) 141.5 kg  10/30/19 (!) 142.9 kg     Intake/Output Summary (Last 24 hours) at 05/25/2020 1451 Last data filed at 05/25/2020 0900 Gross per 24 hour  Intake 720 ml  Output 1050 ml  Net -330 ml     Physical Exam  Awake Alert, Oriented X 3, No new F.N deficits, Normal affect Symmetrical Chest wall movement, Good air movement bilaterally, CTAB RRR,No Gallops,Rubs or new Murmurs, No Parasternal Heave +ve B.Sounds, Abd Soft, No tenderness, No rebound - guarding or rigidity. No Cyanosis, Clubbing or edema, No new Rash or bruise         Data Review:    CBC Recent Labs  Lab 05/21/20 0659 05/22/20 1032 05/23/20 0238 05/24/20 0350 05/25/20 0840  WBC 13.9* 14.5* 14.1* 16.1* 17.0*  HGB 14.9 15.7 14.3 13.9 14.5  HCT 44.3 47.4 44.3 43.6 44.5  PLT 271 271 278 270 245  MCV 82.6 83.0 83.1 83.4 84.0  MCH 27.8 27.5 26.8 26.6 27.4  MCHC 33.6 33.1 32.3 31.9 32.6  RDW 13.9 14.0 13.8 13.7 13.7  LYMPHSABS 1.3 1.3 1.1 0.7 0.6*  MONOABS 0.7 0.6 0.7 0.9 0.7  EOSABS  0.1 0.0 0.0 0.0 0.0  BASOSABS 0.1 0.1 0.0 0.0 0.1    Chemistries  Recent Labs  Lab 05/21/20 0659 05/22/20 1032 05/23/20 0238 05/24/20 0350 05/25/20 0840  NA 133* 136 131* 135 136  K 4.7 4.0 4.2 4.3 4.5  CL 97* 102 99 102 107  CO2 21* 21* 22 24 20*  GLUCOSE 182* 202* 332* 176* 90  BUN 27* 22* 23* 19 17  CREATININE 0.97 0.90 0.94 0.95 0.83  CALCIUM 8.0* 8.0* 7.8* 8.1* 8.0*  AST 54* 36 30 22  24  ALT 38 34 29 23 21   ALKPHOS 63 71 73 69 62  BILITOT 0.7 0.6 0.7 0.6 0.8   ------------------------------------------------------------------------------------------------------------------ No results for input(s): CHOL, HDL, LDLCALC, TRIG, CHOLHDL, LDLDIRECT in the last 72 hours.  Lab Results  Component Value Date   HGBA1C 14.4 (H) 05/19/2020   ------------------------------------------------------------------------------------------------------------------ No results for input(s): TSH, T4TOTAL, T3FREE, THYROIDAB in the last 72 hours.  Invalid input(s): FREET3 ------------------------------------------------------------------------------------------------------------------ No results for input(s): VITAMINB12, FOLATE, FERRITIN, TIBC, IRON, RETICCTPCT in the last 72 hours.  Coagulation profile Recent Labs  Lab 05/21/20 0659 05/22/20 1032 05/23/20 0238 05/24/20 0350 05/25/20 0840  INR 1.3* 1.9* 2.3* 3.2* 3.8*    Recent Labs    05/24/20 0350 05/25/20 0840  DDIMER >20.00* >20.00*    Cardiac Enzymes No results for input(s): CKMB, TROPONINI, MYOGLOBIN in the last 168 hours.  Invalid input(s): CK ------------------------------------------------------------------------------------------------------------------    Component Value Date/Time   BNP 14.5 06/07/2015 1455    Inpatient Medications  Scheduled Meds: . aspirin EC  81 mg Oral Daily  . baricitinib  4 mg Oral Daily  . insulin aspart  0-20 Units Subcutaneous TID WC  . insulin aspart  0-5 Units Subcutaneous QHS    . insulin aspart  12 Units Subcutaneous TID WC  . insulin glargine  60 Units Subcutaneous QHS  . insulin glargine  85 Units Subcutaneous Daily  . linagliptin  5 mg Oral Daily  . pantoprazole  40 mg Oral BID AC   Continuous Infusions:  PRN Meds:.acetaminophen, albuterol, guaiFENesin-dextromethorphan, senna-docusate, sodium chloride  Micro Results Recent Results (from the past 240 hour(s))  SARS Coronavirus 2 by RT PCR (hospital order, performed in [facility] hospital lab) Nasopharyngeal Nasopharyngeal Swab     Status: Abnormal   Collection Time: 05/18/20  6:51 PM   Specimen: Nasopharyngeal Swab  Result Value Ref Range Status   SARS Coronavirus 2 POSITIVE (A) NEGATIVE Final    Comment: RESULT CALLED TO, READ BACK BY AND VERIFIED WITH: CALLED TO K.NEAL RN AT 1938 BY SROY ON [id number] (NOTE) SARS-CoV-2 target nucleic acids are DETECTED  SARS-CoV-2 RNA is generally detectable in upper respiratory specimens  during the acute phase of infection.  Positive results are indicative  of the presence of the identified virus, but do not rule out bacterial infection or co-infection with other pathogens not detected by the test.  Clinical correlation with patient history and  other diagnostic information is necessary to determine patient infection status.  The expected result is negative.  Fact Sheet for Patients:   BoilerBrush.com.cy   Fact Sheet for Healthcare Providers:   https://pope.com/    This test is not yet approved or cleared by the [place] FDA and  has been authorized for detection and/or diagnosis of SARS-CoV-2 by FDA under an Emergency Use Authorization (EUA).  This EUA will remain in effect (mean ing this test can be used) for the duration of  the COVID-19 declaration under Section 564(b)(1) of the Act, 21 U.S.C. section 360-bbb-3(b)(1), unless the authorization is terminated or revoked sooner.  Performed at [facility], [address] [place], [place] [address]   Blood Culture (routine x 2)     Status: None   Collection Time: 05/18/20  7:26 PM   Specimen: BLOOD  Result Value Ref Range Status   Specimen Description   Final    BLOOD LEFT ANTECUBITAL Performed at [facility], [address] [place], [place] [address]    Special Requests  Final    BOTTLES DRAWN AEROBIC AND ANAEROBIC Blood Culture adequate volume Performed at [facility], [address] [place], [place] [address]    Culture   Final    NO GROWTH 5 DAYS Performed at [facility], [address] [place], [place] [address]    Report Status 05/23/2020 FINAL  Final  Blood Culture (routine x 2)     Status: None   Collection Time: 05/18/20  9:39 PM   Specimen: BLOOD LEFT WRIST  Result Value Ref Range Status   Specimen Description   Final    BLOOD LEFT WRIST Performed at [facility], [address] [place], [place] [address]    Special Requests   Final    BOTTLES DRAWN AEROBIC AND ANAEROBIC Blood Culture adequate volume Performed at [facility], [address] [place], [place] [address]    Culture   Final    NO GROWTH 5 DAYS Performed at [facility], [address] [place], [place] [address]    Report Status 05/24/2020 FINAL  Final    Radiology Reports CT ANGIO CHEST PE W OR WO CONTRAST  Result Date: 05/25/2020 CLINICAL DATA:  COVID-19 patient with history of pulmonary embolus and left ventricular thrombus. Elevated D-dimers. Suspected pulmonary embolus with high probability. EXAM: CT ANGIOGRAPHY CHEST WITH CONTRAST TECHNIQUE: Multidetector CT imaging of the chest was performed using the standard protocol during bolus administration of intravenous contrast. Multiplanar CT image reconstructions and MIPs were obtained to evaluate the vascular anatomy. CONTRAST:  OMNIPAQUE IOHEXOL 350 MG/ML SOLN COMPARISON:  08/18/2018 FINDINGS: Cardiovascular: Motion  artifact limits examination. Moderately good opacification of the central and segmental pulmonary arteries. No filling defects identified suggesting no evidence of significant central pulmonary embolus. Peripheral pulmonary arteries may be obscured. Normal heart size. The previous left ventricular thrombus is resolved. No pericardial effusions. Pericardial calcification along the apex. Normal caliber thoracic aorta. 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 Mediastinum/Nodes: Surgical clips. Esophagus is decompressed. No significant lymphadenopathy. Thyroid gland is unremarkable. Lungs/Pleura: Diffuse bilateral ground-glass airspace infiltration throughout both lungs. This could represent multifocal COVID pneumonia or edema. No pleural effusions. No pneumothorax. Airways are patent. Upper Abdomen: No acute abnormalities demonstrated in the visualized upper abdomen. Musculoskeletal: Degenerative changes in the spine. Sternotomy wires. No acute displaced fractures. Review of the MIP images confirms the above findings. IMPRESSION: 1. No evidence of significant central pulmonary embolus. The previous left ventricular thrombus is resolved. 2. Diffuse bilateral ground-glass airspace infiltration throughout both lungs could represent multifocal COVID pneumonia or edema. Electronically Signed   By: Burman Joseph M.D.   On: 05/25/2020 04:57   DG Chest Portable 1 View  Result Date: 05/18/2020 CLINICAL DATA:  Shortness of breath, rule out COVID. EXAM: PORTABLE CHEST 1 VIEW COMPARISON:  Chest x-ray 09/27/2018. FINDINGS: Sternotomy wires are again noted. The heart size and mediastinal contours are within normal limits. Diffuse hazy airspace opacities that are more prominent in the peripheral left lung. No pulmonary edema. No pleural effusion. No pneumothorax. No acute osseous abnormality. IMPRESSION: Multifocal pneumonia likely representing known COVID-19 infection Electronically Signed   By: Tish Frederickson M.D.   On: 05/18/2020 20:17   VAS [place] LOWER EXTREMITY VENOUS (DVT)  Result Date: 05/24/2020  Lower Venous DVTStudy Indications: Elevated d-dimer.  Risk Factors: DVT Personal history. Anticoagulation: Coumadin. Limitations: Poor ultrasound/tissue interface. Comparison Study: 01-24-2020 Prior LT lower venous  08-19-2018 Prior RT lower venous Performing Technologist: Jean Rosenthal  Examination Guidelines: A complete evaluation includes B-mode imaging, spectral Doppler, color Doppler, and power Doppler as needed of all accessible portions of each vessel. Bilateral testing is considered an integral part of a complete examination. Limited examinations for reoccurring indications may be performed as noted. The reflux portion of the exam is performed with the patient in reverse Trendelenburg.  +---------+---------------+---------+-----------+----------+---------------+ RIGHT    CompressibilityPhasicitySpontaneityPropertiesThrombus Aging  +---------+---------------+---------+-----------+----------+---------------+ CFV      Full           Yes      Yes                                  +---------+---------------+---------+-----------+----------+---------------+ SFJ      Full                                                         +---------+---------------+---------+-----------+----------+---------------+ FV Prox  Full                                                         +---------+---------------+---------+-----------+----------+---------------+ FV Mid   Full                                                         +---------+---------------+---------+-----------+----------+---------------+ FV DistalFull                                                         +---------+---------------+---------+-----------+----------+---------------+ PFV      Full                                                          +---------+---------------+---------+-----------+----------+---------------+ POP      Full           Yes      Yes                                  +---------+---------------+---------+-----------+----------+---------------+ PTV      Full                                                         +---------+---------------+---------+-----------+----------+---------------+ PERO  Patent by color +---------+---------------+---------+-----------+----------+---------------+ Gastroc  Full                                                         +---------+---------------+---------+-----------+----------+---------------+   +---------+---------------+---------+-----------+----------+-------------------+ LEFT     CompressibilityPhasicitySpontaneityPropertiesThrombus Aging      +---------+---------------+---------+-----------+----------+-------------------+ CFV      Full           Yes      Yes                                      +---------+---------------+---------+-----------+----------+-------------------+ SFJ      Full                                                             +---------+---------------+---------+-----------+----------+-------------------+ FV Prox  Full                                                             +---------+---------------+---------+-----------+----------+-------------------+ FV Mid   Full                                                             +---------+---------------+---------+-----------+----------+-------------------+ FV DistalFull                                                             +---------+---------------+---------+-----------+----------+-------------------+ PFV      Full                                                             +---------+---------------+---------+-----------+----------+-------------------+ POP      Full           Yes       Yes                                      +---------+---------------+---------+-----------+----------+-------------------+ PTV      Full                                                             +---------+---------------+---------+-----------+----------+-------------------+  PERO     Full                                         Some segments not                                                         well visualized     +---------+---------------+---------+-----------+----------+-------------------+ Gastroc  Full                                                             +---------+---------------+---------+-----------+----------+-------------------+     Summary: RIGHT: - There is no evidence of deep vein thrombosis in the lower extremity. However, portions of this examination were limited- see technologist comments above.  - No cystic structure found in the popliteal fossa.  LEFT: - There is no evidence of deep vein thrombosis in the lower extremity. However, portions of this examination were limited- see technologist comments above.  - No cystic structure found in the popliteal fossa.  *See table(s) above for measurements and observations. Electronically signed by Waverly Ferrari MD on 05/24/2020 at 3:16:29 PM.    Final      Huey Bienenstock M.D on 05/25/2020 at 2:51 PM    [facility] -  Office  [phone]

## 2020-05-25 NOTE — Plan of Care (Signed)
  Problem: Education: Goal: Knowledge of risk factors and measures for prevention of condition will improve Outcome: Progressing   Problem: Respiratory: Goal: Will maintain a patent airway Outcome: Progressing   Problem: Coping: Goal: Psychosocial and spiritual needs will be supported Outcome: Progressing   Problem: Respiratory: Goal: Will maintain a patent airway Outcome: Progressing   Problem: Clinical Measurements: Goal: Will remain free from infection Outcome: Progressing   Problem: Clinical Measurements: Goal: Respiratory complications will improve Outcome: Progressing   Problem: Safety: Goal: Ability to remain free from injury will improve Outcome: Progressing   Problem: Skin Integrity: Goal: Risk for impaired skin integrity will decrease Outcome: Progressing

## 2020-05-26 LAB — GLUCOSE, CAPILLARY
Glucose-Capillary: 225 mg/dL — ABNORMAL HIGH (ref 70–99)
Glucose-Capillary: 253 mg/dL — ABNORMAL HIGH (ref 70–99)
Glucose-Capillary: 238 mg/dL — ABNORMAL HIGH (ref 70–99)
Glucose-Capillary: 272 mg/dL — ABNORMAL HIGH (ref 70–99)

## 2020-05-26 LAB — PROTIME-INR
INR: 3.4 — ABNORMAL HIGH (ref 0.8–1.2)
Prothrombin Time: 33.3 seconds — ABNORMAL HIGH (ref 11.4–15.2)

## 2020-05-26 LAB — COMPREHENSIVE METABOLIC PANEL
ALT: 23 U/L (ref 0–44)
AST: 20 U/L (ref 15–41)
Albumin: 2 g/dL — ABNORMAL LOW (ref 3.5–5.0)
Alkaline Phosphatase: 64 U/L (ref 38–126)
Anion gap: 15 (ref 5–15)
BUN: 21 mg/dL — ABNORMAL HIGH (ref 6–20)
CO2: 17 mmol/L — ABNORMAL LOW (ref 22–32)
Calcium: 7.8 mg/dL — ABNORMAL LOW (ref 8.9–10.3)
Chloride: 103 mmol/L (ref 98–111)
Creatinine, Ser: 0.81 mg/dL (ref 0.61–1.24)
GFR calc Af Amer: >60 mL/min (ref >60)
GFR calc non Af Amer: >60 mL/min (ref >60)
Glucose, Bld: 240 mg/dL — ABNORMAL HIGH (ref 70–99)
Potassium: 4.4 mmol/L (ref 3.5–5.1)
Sodium: 135 mmol/L (ref 135–145)
Total Bilirubin: 0.7 mg/dL (ref 0.3–1.2)
Total Protein: 5.2 g/dL — ABNORMAL LOW (ref 6.5–8.1)

## 2020-05-26 LAB — CBC
HCT: 46.5 % (ref 39.0–52.0)
Hemoglobin: 14.2 g/dL (ref 13.0–17.0)
MCH: 26.7 pg (ref 26.0–34.0)
MCHC: 30.5 g/dL (ref 30.0–36.0)
MCV: 87.4 fL (ref 80.0–100.0)
Platelets: 289 10*3/uL (ref 150–400)
RBC: 5.32 MIL/uL (ref 4.22–5.81)
RDW: 13.9 % (ref 11.5–15.5)
WBC: 11.7 10*3/uL — ABNORMAL HIGH (ref 4.0–10.5)
nRBC: 0 % (ref 0.0–0.2)

## 2020-05-26 LAB — D-DIMER, QUANTITATIVE: D-Dimer, Quant: >20 ug/mL-FEU — ABNORMAL HIGH (ref 0.00–0.50)

## 2020-05-26 MED ORDER — FUROSEMIDE 10 MG/ML IJ SOLN
20.0000 mg | Freq: Once | INTRAMUSCULAR | Status: AC
  Administered 2020-05-26: 20 mg via INTRAVENOUS
  Filled 2020-05-26: qty 2

## 2020-05-26 MED ORDER — INFLUENZA VAC SPLIT QUAD 0.5 ML IM SUSY
0.5000 mL | PREFILLED_SYRINGE | INTRAMUSCULAR | Status: DC
  Filled 2020-05-26: qty 0.5

## 2020-05-26 MED ORDER — INSULIN GLARGINE 100 UNIT/ML ~~LOC~~ SOLN
50.0000 [IU] | Freq: Every day | SUBCUTANEOUS | Status: DC
  Administered 2020-05-26 – 2020-05-28 (×3): 50 [IU] via SUBCUTANEOUS
  Filled 2020-05-26 (×5): qty 0.5

## 2020-05-26 NOTE — Progress Notes (Signed)
ANTICOAGULATION CONSULT NOTE  Pharmacy Consult for heparin>>Arixtra Indication: Hx DVT/PE  Allergies  Allergen Reactions  . Contrast Media [Iodinated Diagnostic Agents] Itching    Pt states he thinks it makes him itch  . Lovenox [Enoxaparin Sodium] Itching    Itching over entire body  . Percocet [Oxycodone-Acetaminophen] Hives and Itching    Patient Measurements: Height: 5\' 10"  (177.8 cm) Weight: (!) 137 kg (302 lb 0.5 oz) IBW/kg (Calculated) : 73 Heparin Dosing Weight: 107 kg   Vital Signs: Temp: 98.1 F (36.7 C) (09/28 0737) Temp Source: Axillary (09/28 0737) BP: 112/56 (09/28 0737) Pulse Rate: 78 (09/28 0929)  Labs: Recent Labs    05/24/20 0350 05/24/20 0350 05/25/20 0840 05/26/20 0621  HGB 13.9   < > 14.5 14.2  HCT 43.6  --  44.5 46.5  PLT 270  --  245 289  LABPROT 31.7*  --  36.4* 33.3*  INR 3.2*  --  3.8* 3.4*  CREATININE 0.95  --  0.83 0.81   < > = values in this interval not displayed.    Estimated Creatinine Clearance: 155.5 mL/min (by C-G formula based on SCr of 0.81 mg/dL).    Assessment: Patient with history of recurrent clots, on long-term warfarin - admission INR = 1.0 (down from 3.4 eight days ago); patient also has itching allergy to lovenox but has received heparin at [facility] facilities in the recent past. Reports he failed Xarelto in past.   Note said NOAC prob more appropriate but uninsured.  9/22: MD changed to Arixtra while in hospital given elevated D-dimer.  9/25: Arixtra discontinued due to therapeutic INR 9/26 MD concerned pt has PE,  D-dimer  increased  from .96  to >20 despite therapeutic INR on warfarin 9/26  Venous dopplers BLE : negative 9/27 CT angio negative .   9/27 Dr. 10/27 wants to wait til INR drops < 2.5 then start Arixtra during hospital stay due to elevated d-dimer despite therapeutic INR  INR trend last 3 days:   3.2>3.8>3.4,   warfarin on hold, last given 05/22/20.   Goal of Therapy:  INR 2-3 Monitor platelets by  anticoagulation protocol: Yes   Plan:  Warfarin is on hold per Dr. 05/24/20.   Dr. Randol Kern wants to wait til INR drops< 2.5 then start Arixtra.  To change to arixtra while in hospital, Dr. Randol Kern reported he  plans to resume warfarin and DC home on warfarin when D Dimer improves.  Daily CBC/INR Monitor closely for s/sx and bleeding   Randol Kern, RPh Pharmacy Resident [phone] 05/26/2020 11:46 AM

## 2020-05-26 NOTE — Progress Notes (Signed)
PROGRESS NOTE                                                                                                                                                                                                             Patient Demographics:    Marcus Joseph, is a 49 y.o. male, DOB - 06-16-1971, EAV:[id number]  Admit date - 05/18/2020   Admitting Physician Marcus AdamBradley S Chotiner, MD  Outpatient Primary MD for the patient is [facility]  LOS - 7   Chief Complaint  Patient presents with  . Shortness of Breath       Brief Narrative     Marcus SpillerWilliam L Oriordan Jr. is a 49 y.o. 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  He [facility] room 2 days ago with complaint of shortness of breath and not feeling well.  's work-up was significant for COVID-19 pneumonia, and severe hypoxia, for which she is requiring heated high flow nasal cannula, [facility]  [facility] management, as well he was noted to have significantly elevated hyperglycemia with A1c of 14.4.  As well despite being on full anticoagulation with therapeutic INR, patient's D-dimer increased>20, CTA chest with no PE, venous Doppler with no DVT.    Subjective:    Marcus Joseph today  ports his dyspnea has improved, cough has improved as well, he denies any chest pain.   Assessment  & Plan :    Active Problems:   History of recurrent pulmonary embolism   Morbid obesity (HCC)   Long term (current) use of anticoagulants   Pneumonia due to COVID-19 virus   Diabetes mellitus with hyperglycemia (HCC)   Hypoxia  Acute hypoxic respiratory failure due to COVID-19 pneumonia -Patient is unvaccinated. -Chest x-ray significant for multifocal pneumonia bilaterally. -He remains with severe COVID-19 pneumonia, with significant  lung injury due to COVID-19, remains with significant oxygen requirement, has been gradually improving, this morning he is on 20 L heated high flow nasal cannula 60%, discussed with staff will try to change to high flow nasal cannula , with with NRB as needed . -Patient was encouraged to use incentive spirometry and flutter valve, and he was able to do modified proning overnight,  out of bed to chair. -Continue with IV steroids -Treated  with IV remdesivir. -Continue with Baricitinib. -Continue to trend inflammatory markers. -Treated with IV Rocephin and azithromycin given procalcitonin 0.42 on admission. -Lasix as needed, give another 40 mg of IV Lasix today.   SpO2: 93 % O2 Flow Rate (L/min): 20 L/min FiO2 (%): 60 %     COVID-19 Labs  Recent Labs    05/24/20 0350 05/25/20 0840 05/26/20 0621  DDIMER >20.00* >20.00* >20.00*    Lab Results  Component Value Date   SARSCOV2NAA POSITIVE (A) 05/18/2020   History of recurrent PE/left ventricular thrombus -He is on warfarin, with subtherapeutic INR on presentation, report he has been compliant with his warfarin, reports he failed Xarelto as outpatient in the past. -INR subtherapeutic on presentation, initially on heparin drip, then Arixtra bridge until INR became therapeutic. -D-dimers>20, despite therapeutic INR, venous Doppler with no DVT on 9/26, CTA with no PE on 9/28, but given significantly elevated D-dimers, will change his anticoagulation from warfarin to Arixtra during hospital stay, will await INR to drift less than 2.5 before initiating Arixtra.  Morbid obesity (HCC)  - Long term (current) use of anticoagulants  Diabetes mellitus -Very poorly controlled controlled, A1c of 14.4 -Started on Tradjenta. -CBG difficult to control, had to go up on his Lantus dose significantly, but currently has improved on current dose of Lantus 85 units daily and 15 units at bedtime, continue with premeal NovoLog and resistant sliding scale.   . This morning CBG of 85, so I will decrease evening dose Lantus to 40 units, but continue with 85 units daytime.  Continue with sliding scale and premeal NovoLog .  Hypertension -Soft blood pressure, hold Cozaar  Epistaxis - resolved  Code Status : Full  Family communication: patient is appropriate, coherent, he was updated in detail, all his questions answered. patient reports he will update family  himself.  Disposition Plan  :  Status is: Inpatient  Remains inpatient appropriate because:IV treatments appropriate due to intensity of illness or inability to take PO   Dispo: The patient is from: Home              Anticipated d/c is to: Home              Anticipated d/c date is: > 3 days              Patient currently is not medically stable to d/c.    Consults  :  None  Procedures  : none  DVT Prophylaxis  :  Heparin >>Arixtra>Warfarin   Lab Results  Component Value Date   PLT 289 05/26/2020    Antibiotics  :    Anti-infectives (From admission, onward)   Start     Dose/Rate Route Frequency Ordered Stop   05/21/20 1300  cefTRIAXone (ROCEPHIN) 2 g in sodium chloride 0.9 % 100 mL IVPB        2 g 200 mL/hr over 30 Minutes Intravenous Every 24 hours 05/21/20 0901 05/24/20 1246   05/21/20 1230  cefTRIAXone (ROCEPHIN) 2 g in sodium chloride 0.9 % 100 mL IVPB  Status:  Discontinued        2 g 200 mL/hr over 30 Minutes Intravenous Every 24 hours 05/20/20 1346 05/20/20 1349   05/21/20 1000  remdesivir 100 mg in sodium chloride 0.9 % 100 mL IVPB  Status:  Discontinued       "Followed by" Linked Group Details   100 mg 200 mL/hr over 30 Minutes  Intravenous Daily 05/20/20 0139 05/20/20 0142   05/20/20 1349  cefTRIAXone (ROCEPHIN) 2 g in sodium chloride 0.9 % 100 mL IVPB  Status:  Discontinued        2 g 200 mL/hr over 30 Minutes Intravenous Every 24 hours 05/20/20 1349 05/21/20 0901   05/20/20 1300  azithromycin (ZITHROMAX) 500 mg in sodium chloride 0.9 % 250 mL IVPB          500 mg 250 mL/hr over 60 Minutes Intravenous Every 24 hours 05/20/20 1155 05/24/20 1402   05/20/20 1230  cefTRIAXone (ROCEPHIN) 1 g in sodium chloride 0.9 % 100 mL IVPB  Status:  Discontinued        1 g 200 mL/hr over 30 Minutes Intravenous Every 24 hours 05/20/20 1155 05/20/20 1346   05/20/20 0139  remdesivir 200 mg in sodium chloride 0.9% 250 mL IVPB  Status:  Discontinued       "Followed by" Linked Group Details   200 mg 580 mL/hr over 30 Minutes Intravenous Once 05/20/20 0139 05/20/20 0142   05/19/20 1000  remdesivir 100 mg in sodium chloride 0.9 % 100 mL IVPB       "Followed by" Linked Group Details   100 mg 200 mL/hr over 30 Minutes Intravenous Daily 05/18/20 2059 05/22/20 2103   05/18/20 2130  remdesivir 100 mg in sodium chloride 0.9 % 100 mL IVPB       "Followed by" Linked Group Details   100 mg 200 mL/hr over 30 Minutes Intravenous  Once 05/18/20 2059 05/18/20 2242   05/18/20 2100  remdesivir 100 mg in sodium chloride 0.9 % 100 mL IVPB       "Followed by" Linked Group Details   100 mg 200 mL/hr over 30 Minutes Intravenous  Once 05/18/20 2059 05/18/20 2205        Objective:   Vitals:   05/26/20 0419 05/26/20 0737 05/26/20 0929 05/26/20 1158  BP: 118/69 (!) 112/56  103/65  Pulse: 66 60 78 66  Resp: 20 (!) 22 (!) 21 19  Temp: 98.8 F (37.1 C) 98.1 F (36.7 C)  (!) 97.5 F (36.4 C)  TempSrc: Oral Axillary  Oral  SpO2: 94% 94% 95% 93%  Weight:      Height:        Wt Readings from Last 3 Encounters:  05/25/20 (!) 137 kg  01/23/20 (!) 141.5 kg  10/30/19 (!) 142.9 kg     Intake/Output Summary (Last 24 hours) at 05/26/2020 1210 Last data filed at 05/26/2020 0920 Gross per 24 hour  Intake 1330 ml  Output 1575 ml  Net -245 ml     Physical Exam  Awake Alert, Oriented X 3, No new F.N deficits, Normal affect Symmetrical Chest wall movement, Good air movement bilaterally, CTAB RRR,No Gallops,Rubs or new Murmurs, No Parasternal Heave +ve B.Sounds, Abd Soft, No  tenderness, No rebound - guarding or rigidity. No Cyanosis, Clubbing or edema, No new Rash or bruise      Data Review:    CBC Recent Labs  Lab 05/21/20 0659 05/21/20 0659 05/22/20 1032 05/23/20 0238 05/24/20 0350 05/25/20 0840 05/26/20 0621  WBC 13.9*   < > 14.5* 14.1* 16.1* 17.0* 11.7*  HGB 14.9   < > 15.7 14.3 13.9 14.5 14.2  HCT 44.3   < > 47.4 44.3 43.6 44.5 46.5  PLT 271   < > 271 278 270 245 289  MCV 82.6   < > 83.0 83.1 83.4 84.0 87.4  MCH 27.8   < >  27.5 26.8 26.6 27.4 26.7  MCHC 33.6   < > 33.1 32.3 31.9 32.6 30.5  RDW 13.9   < > 14.0 13.8 13.7 13.7 13.9  LYMPHSABS 1.3  --  1.3 1.1 0.7 0.6*  --   MONOABS 0.7  --  0.6 0.7 0.9 0.7  --   EOSABS 0.1  --  0.0 0.0 0.0 0.0  --   BASOSABS 0.1  --  0.1 0.0 0.0 0.1  --    < > = values in this interval not displayed.    Chemistries  Recent Labs  Lab 05/22/20 1032 05/23/20 0238 05/24/20 0350 05/25/20 0840 05/26/20 0621  NA 136 131* 135 136 135  K 4.0 4.2 4.3 4.5 4.4  CL 102 99 102 107 103  CO2 21* 22 24 20* 17*  GLUCOSE 202* 332* 176* 90 240*  BUN 22* 23* 19 17 21*  CREATININE 0.90 0.94 0.95 0.83 0.81  CALCIUM 8.0* 7.8* 8.1* 8.0* 7.8*  AST 36 ALT 34 ALKPHOS 71 73 69 62 64  BILITOT 0.6 0.7 0.6 0.8 0.7   ------------------------------------------------------------------------------------------------------------------ No results for input(s): CHOL, HDL, LDLCALC, TRIG, CHOLHDL, LDLDIRECT in the last 72 hours.  Lab Results  Component Value Date   HGBA1C 14.4 (H) 05/19/2020   ------------------------------------------------------------------------------------------------------------------ No results for input(s): TSH, T4TOTAL, T3FREE, THYROIDAB in the last 72 hours.  Invalid input(s): FREET3 ------------------------------------------------------------------------------------------------------------------ No results for input(s): VITAMINB12, FOLATE, FERRITIN, TIBC, IRON, RETICCTPCT in  the last 72 hours.  Coagulation profile Recent Labs  Lab 05/22/20 1032 05/23/20 0238 05/24/20 0350 05/25/20 0840 05/26/20 0621  INR 1.9* 2.3* 3.2* 3.8* 3.4*    Recent Labs    05/25/20 0840 05/26/20 0621  DDIMER >20.00* >20.00*    Cardiac Enzymes No results for input(s): CKMB, TROPONINI, MYOGLOBIN in the last 168 hours.  Invalid input(s): CK ------------------------------------------------------------------------------------------------------------------    Component Value Date/Time   BNP 14.5 06/07/2015 1455    Inpatient Medications  Scheduled Meds: . aspirin EC  81 mg Oral Daily  . baricitinib  4 mg Oral Daily  . insulin aspart  0-20 Units Subcutaneous TID WC  . insulin aspart  0-5 Units Subcutaneous QHS  . insulin aspart  12 Units Subcutaneous TID WC  . insulin glargine  50 Units Subcutaneous QHS  . insulin glargine  85 Units Subcutaneous Daily  . linagliptin  5 mg Oral Daily  . methylPREDNISolone (SOLU-MEDROL) injection  60 mg Intravenous Q8H  . pantoprazole  40 mg Oral BID AC   Continuous Infusions:  PRN Meds:.acetaminophen, albuterol, guaiFENesin-dextromethorphan, senna-docusate, sodium chloride  Micro Results Recent Results (from the past 240 hour(s))  SARS Coronavirus 2 by RT PCR (hospital order, performed in [facility] hospital lab) Nasopharyngeal Nasopharyngeal Swab     Status: Abnormal   Collection Time: 05/18/20  6:51 PM   Specimen: Nasopharyngeal Swab  Result Value Ref Range Status   SARS Coronavirus 2 POSITIVE (A) NEGATIVE Final    Comment: RESULT CALLED TO, READ BACK BY AND VERIFIED WITH: CALLED TO K.NEAL RN AT 1938 BY SROY ON [id number] (NOTE) SARS-CoV-2 target nucleic acids are DETECTED  SARS-CoV-2 RNA is generally detectable in upper respiratory specimens  during the acute phase of infection.  Positive results are indicative  of the presence of the identified virus, but do not rule out bacterial infection or co-infection with other  pathogens not detected by the test.  Clinical correlation with patient history and  other diagnostic information is necessary  to determine patient infection status.  The expected result is negative.  Fact Sheet for Patients:   BoilerBrush.com.cy   Fact Sheet for Healthcare Providers:   https://pope.com/    This test is not yet approved or cleared by the [place] FDA and  has been authorized for detection and/or diagnosis of SARS-CoV-2 by FDA under an Emergency Use Authorization (EUA).  This EUA will remain in effect (mean ing this test can be used) for the duration of  the COVID-19 declaration under Section 564(b)(1) of the Act, 21 U.S.C. section 360-bbb-3(b)(1), unless the authorization is terminated or revoked sooner.  Performed at [facility], [address] [place], [place] [address]   Blood Culture (routine x 2)     Status: None   Collection Time: 05/18/20  7:26 PM   Specimen: BLOOD  Result Value Ref Range Status   Specimen Description   Final    BLOOD LEFT ANTECUBITAL Performed at [facility], [address], [place], [place] [address]    Special Requests   Final    BOTTLES DRAWN AEROBIC AND ANAEROBIC Blood Culture adequate volume Performed at [facility], [address] [place], [place] [address]    Culture   Final    NO GROWTH 5 DAYS Performed at [facility], [address] [place], [place] [address]    Report Status 05/23/2020 FINAL  Final  Blood Culture (routine x 2)     Status: None   Collection Time: 05/18/20  9:39 PM   Specimen: BLOOD LEFT WRIST  Result Value Ref Range Status   Specimen Description   Final    BLOOD LEFT WRIST Performed at [facility], [address] [place], [place] [address]    Special Requests   Final    BOTTLES DRAWN AEROBIC AND ANAEROBIC Blood Culture adequate volume Performed at [facility], [address] [place], [place] [address]    Culture   Final    NO GROWTH 5 DAYS Performed at [facility], [address] [place], [place] [address]    Report Status 05/24/2020 FINAL  Final    Radiology Reports CT ANGIO CHEST PE W OR WO CONTRAST  Result Date: 05/25/2020 CLINICAL DATA:  COVID-19 patient with history of pulmonary embolus and left ventricular thrombus. Elevated D-dimers. Suspected pulmonary embolus with high probability. EXAM: CT ANGIOGRAPHY CHEST WITH CONTRAST TECHNIQUE: Multidetector CT imaging of the chest was performed using the standard protocol during bolus administration of intravenous contrast. Multiplanar CT image reconstructions and MIPs were obtained to evaluate the vascular anatomy. CONTRAST:  OMNIPAQUE IOHEXOL 350 MG/ML SOLN COMPARISON:  08/18/2018 FINDINGS: Cardiovascular: Motion artifact limits examination. Moderately good opacification of the central and segmental pulmonary arteries. No filling defects identified suggesting no evidence of significant central pulmonary embolus. Peripheral pulmonary arteries may be obscured. Normal heart size. The previous left ventricular thrombus is resolved. No pericardial effusions. Pericardial calcification along the apex. Normal caliber thoracic aorta. 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 Mediastinum/Nodes: Surgical clips. Esophagus is decompressed. No significant lymphadenopathy. Thyroid gland is unremarkable. Lungs/Pleura: Diffuse bilateral ground-glass airspace infiltration throughout both lungs. This could represent multifocal COVID pneumonia or edema. No pleural effusions. No pneumothorax. Airways are patent. Upper Abdomen: No acute abnormalities demonstrated in the visualized upper abdomen. Musculoskeletal: Degenerative changes in the spine. Sternotomy wires. No acute displaced fractures. Review of the MIP images confirms the above findings. IMPRESSION: 1.  No evidence of significant central pulmonary embolus. The  previous left ventricular thrombus is resolved. 2. Diffuse bilateral ground-glass airspace infiltration throughout both lungs could represent multifocal COVID pneumonia or edema. Electronically Signed   By: Burman Nieves M.D.   On: 05/25/2020 04:57   DG Chest Portable 1 View  Result Date: 05/18/2020 CLINICAL DATA:  Shortness of breath, rule out COVID. EXAM: PORTABLE CHEST 1 VIEW COMPARISON:  Chest x-ray 09/27/2018. FINDINGS: Sternotomy wires are again noted. The heart size and mediastinal contours are within normal limits. Diffuse hazy airspace opacities that are more prominent in the peripheral left lung. No pulmonary edema. No pleural effusion. No pneumothorax. No acute osseous abnormality. IMPRESSION: Multifocal pneumonia likely representing known COVID-19 infection Electronically Signed   By: Tish Frederickson M.D.   On: 05/18/2020 20:17   VAS [place] LOWER EXTREMITY VENOUS (DVT)  Result Date: 05/24/2020  Lower Venous DVTStudy Indications: Elevated d-dimer.  Risk Factors: DVT Personal history. Anticoagulation: Coumadin. Limitations: Poor ultrasound/tissue interface. Comparison Study: 01-24-2020 Prior LT lower venous                   08-19-2018 Prior RT lower venous Performing Technologist: Jean Rosenthal  Examination Guidelines: A complete evaluation includes B-mode imaging, spectral Doppler, color Doppler, and power Doppler as needed of all accessible portions of each vessel. Bilateral testing is considered an integral part of a complete examination. Limited examinations for reoccurring indications may be performed as noted. The reflux portion of the exam is performed with the patient in reverse Trendelenburg.  +---------+---------------+---------+-----------+----------+---------------+ RIGHT    CompressibilityPhasicitySpontaneityPropertiesThrombus Aging  +---------+---------------+---------+-----------+----------+---------------+ CFV      Full           Yes      Yes                                   +---------+---------------+---------+-----------+----------+---------------+ SFJ      Full                                                         +---------+---------------+---------+-----------+----------+---------------+ FV Prox  Full                                                         +---------+---------------+---------+-----------+----------+---------------+ FV Mid   Full                                                         +---------+---------------+---------+-----------+----------+---------------+ FV DistalFull                                                         +---------+---------------+---------+-----------+----------+---------------+ PFV      Full                                                         +---------+---------------+---------+-----------+----------+---------------+  POP      Full           Yes      Yes                                  +---------+---------------+---------+-----------+----------+---------------+ PTV      Full                                                         +---------+---------------+---------+-----------+----------+---------------+ PERO                                                  Patent by color +---------+---------------+---------+-----------+----------+---------------+ Gastroc  Full                                                         +---------+---------------+---------+-----------+----------+---------------+   +---------+---------------+---------+-----------+----------+-------------------+ LEFT     CompressibilityPhasicitySpontaneityPropertiesThrombus Aging      +---------+---------------+---------+-----------+----------+-------------------+ CFV      Full           Yes      Yes                                      +---------+---------------+---------+-----------+----------+-------------------+ SFJ      Full                                                              +---------+---------------+---------+-----------+----------+-------------------+ FV Prox  Full                                                             +---------+---------------+---------+-----------+----------+-------------------+ FV Mid   Full                                                             +---------+---------------+---------+-----------+----------+-------------------+ FV DistalFull                                                             +---------+---------------+---------+-----------+----------+-------------------+ PFV      Full                                                             +---------+---------------+---------+-----------+----------+-------------------+  POP      Full           Yes      Yes                                      +---------+---------------+---------+-----------+----------+-------------------+ PTV      Full                                                             +---------+---------------+---------+-----------+----------+-------------------+ PERO     Full                                         Some segments not                                                         well visualized     +---------+---------------+---------+-----------+----------+-------------------+ Gastroc  Full                                                             +---------+---------------+---------+-----------+----------+-------------------+     Summary: RIGHT: - There is no evidence of deep vein thrombosis in the lower extremity. However, portions of this examination were limited- see technologist comments above.  - No cystic structure found in the popliteal fossa.  LEFT: - There is no evidence of deep vein thrombosis in the lower extremity. However, portions of this examination were limited- see technologist comments above.  - No cystic structure found in the popliteal fossa.  *See table(s) above for measurements  and observations. Electronically signed by Waverly Ferrari MD on 05/24/2020 at 3:16:29 PM.    Final      Huey Bienenstock M.D on 05/26/2020 at 12:10 PM    [facility] -  Office  [phone]

## 2020-05-26 NOTE — Plan of Care (Signed)
  Problem: Education: Goal: Knowledge of risk factors and measures for prevention of condition will improve Outcome: Progressing   Problem: Respiratory: Goal: Will maintain a patent airway Outcome: Progressing   Problem: Clinical Measurements: Goal: Will remain free from infection Outcome: Progressing   Problem: Clinical Measurements: Goal: Respiratory complications will improve Outcome: Progressing   Problem: Activity: Goal: Risk for activity intolerance will decrease Outcome: Progressing   Problem: Safety: Goal: Ability to remain free from injury will improve Outcome: Progressing

## 2020-05-26 NOTE — Progress Notes (Signed)
Inpatient Diabetes Program Recommendations  AACE/ADA: New Consensus Statement on Inpatient Glycemic Control   Target Ranges:  Prepandial:   less than 140 mg/dL      Peak postprandial:   less than 180 mg/dL (1-2 hours)      Critically ill patients:  140 - 180 mg/dL   Results for Marcus Joseph, Marcus Joseph (MRN [id number]) as of 05/26/2020 13:01  Ref. Range 05/25/2020 07:40 05/25/2020 11:57 05/25/2020 16:13 05/25/2020 21:05 05/26/2020 07:41 05/26/2020 12:00  Glucose-Capillary Latest Ref Range: 70 - 99 mg/dL 86 480 (H) 165 (H) 537 (H) 225 (H) 253 (H)   Review of Glycemic Control  Current orders for Inpatient glycemic control: Lantus 85 units QAM, Lantus 50 units QHS (was 40 QHS), Novolog 12 units TID with meals, Novolog 0-20 units TID with meals, Novolog 0-5 units QHS, Tradjenta 5 mg daily; Solumedrol 60 mg Q8H  Inpatient Diabetes Program Recommendations:    Insulin: If steroids are continued as ordered, please consider increasing meal coverage to Novolog 16 units TID with meals.  Thanks, Orlando Penner, RN, MSN, CDE Diabetes Coordinator Inpatient Diabetes Program [phone] (Team Pager from 8am to 5pm)

## 2020-05-26 NOTE — Evaluation (Signed)
Physical Therapy Evaluation Patient Details Name: Marcus Joseph. MRN: [id number] DOB: [birth year] Today's Date: 05/26/2020   History of Present Illness  Pt is 49 yo male with PMH of DM2, morbid obesity, L TKA, CABG, and hx of DVTs and PE. Pt presented with SHOB.  He was found to be COVID 19 + and admited with hypoxic resp failure due to COVID 19 PNE. CTA and doppler negative for DVTs or PEs.  Clinical Impression  Pt admitted with above diagnosis. Pt requiring supervision and cues for safety for activity with therapy.  He was limited due to on [facility] and unable to ambulate in room.  Pt did march in place with cues for posture and breathing technique. Pt reports he is performing stand pivots to [facility] independently.  Pt will benefit from acute PT services to advance mobility.  Pt with 24 hr support at home and necessary DME.  Pt currently with functional limitations due to the deficits listed below (see PT Problem List). Pt will benefit from skilled PT to increase their independence and safety with mobility to allow discharge to the venue listed below.       Follow Up Recommendations No PT follow up    Equipment Recommendations  None recommended by PT    Recommendations for Other Services       Precautions / Restrictions Precautions Precautions: Other (comment) Precaution Comments: watch O2      Mobility  Bed Mobility               General bed mobility comments: in chair at arrival  Transfers Overall transfer level: Needs assistance Equipment used: None Transfers: Sit to/from Stand Sit to Stand: Supervision         General transfer comment: performed x 2 with supervision for safety  Ambulation/Gait Ambulation/Gait assistance: Supervision   Assistive device: None       General Gait Details: Limited due to heated high flow O2. Pt stood and marched beside chair for 1 min , rested for 4 mins, then did again for 30 sec; cues for posture and pursed lip  breathing  Stairs            Wheelchair Mobility    Modified Rankin (Stroke Patients Only)       Balance Overall balance assessment: Needs assistance Sitting-balance support: No upper extremity supported Sitting balance-Leahy Scale: Good     Standing balance support: No upper extremity supported Standing balance-Leahy Scale: Good Standing balance comment: able to march in place without LOB                             Pertinent Vitals/Pain Pain Assessment: No/denies pain    Home Living Family/patient expects to be discharged to:: Private residence Living Arrangements: Spouse/significant other (girlfriend) Available Help at Discharge: Family;Available 24 hours/day Type of Home: House Home Access: Stairs to enter Entrance Stairs-Rails: None Entrance Stairs-Number of Steps: 2 Home Layout: One level Home Equipment: Emergency planning/management officer - 2 wheels;Bedside commode      Prior Function Level of Independence: Needs assistance   Gait / Transfers Assistance Needed: Reports some difficulties with walking due to recent medical issues.  Some days can only ambulate in house and other days can go further  ADL's / Homemaking Assistance Needed: Is bathing at sink or on shower chair - girlfriend assist with getting in shower; can toilet and dress independently; pt can assist with IADLs but has to sit  Comments: Reports  sciatic nerve issues and DVT/PEs (07/2018) - trying to get disability; Report 1-2 falls in past couple of years     Hand Dominance        Extremity/Trunk Assessment   Upper Extremity Assessment Upper Extremity Assessment: Overall WFL for tasks assessed    Lower Extremity Assessment Lower Extremity Assessment: Overall WFL for tasks assessed    Cervical / Trunk Assessment Cervical / Trunk Assessment: Normal  Communication   Communication: No difficulties  Cognition Arousal/Alertness: Awake/alert Behavior During Therapy: WFL for tasks  assessed/performed Overall Cognitive Status: Within Functional Limits for tasks assessed                                        General Comments General comments (skin integrity, edema, etc.): Pt on 15 L HHFNC at 50% FiO2.  Sats were 94% rest and 83% at lowest during activity. Required 2 mins to recover to 90% O2 saturation after each bout of marching in place.  Had pt demonstrate 5 reps of flutter valve and incentive spirometer up to 1000 mL .    Exercises     Assessment/Plan    PT Assessment Patient needs continued PT services  PT Problem List Decreased strength;Decreased mobility;Decreased coordination;Decreased activity tolerance;Cardiopulmonary status limiting activity;Decreased balance;Decreased safety awareness;Decreased knowledge of use of DME       PT Treatment Interventions DME instruction;Therapeutic activities;Gait training;Therapeutic exercise;Patient/family education;Balance training;Functional mobility training    PT Goals (Current goals can be found in the Care Plan section)  Acute Rehab PT Goals Patient Stated Goal: return home PT Goal Formulation: With patient Time For Goal Achievement: 06/09/20 Potential to Achieve Goals: Good    Frequency Min 3X/week   Barriers to discharge        Co-evaluation               AM-PAC PT "6 Clicks" Mobility  Outcome Measure Help needed turning from your back to your side while in a flat bed without using bedrails?: None Help needed moving from lying on your back to sitting on the side of a flat bed without using bedrails?: None Help needed moving to and from a bed to a chair (including a wheelchair)?: None Help needed standing up from a chair using your arms (e.g., wheelchair or bedside chair)?: None Help needed to walk in hospital room?: A Little Help needed climbing 3-5 steps with a railing? : A Little 6 Click Score: 22    End of Session Equipment Utilized During Treatment: Oxygen Activity  Tolerance: Patient limited by fatigue Patient left: in chair;with call bell/phone within reach Nurse Communication: Mobility status PT Visit Diagnosis: Other abnormalities of gait and mobility (R26.89)    Time: 5638-7564 PT Time Calculation (min) (ACUTE ONLY): 26 min   Charges:   PT Evaluation $PT Eval Moderate Complexity: 1 Mod          Thorin Starner, PT Acute Rehab Services Pager [phone] [facility] [phone]    Rayetta Humphrey 05/26/2020, 1:58 PM

## 2020-05-27 LAB — COMPREHENSIVE METABOLIC PANEL
ALT: 21 U/L (ref 0–44)
AST: 29 U/L (ref 15–41)
Albumin: 2.3 g/dL — ABNORMAL LOW (ref 3.5–5.0)
Alkaline Phosphatase: 65 U/L (ref 38–126)
Anion gap: 15 (ref 5–15)
BUN: 20 mg/dL (ref 6–20)
CO2: 22 mmol/L (ref 22–32)
Calcium: 8.2 mg/dL — ABNORMAL LOW (ref 8.9–10.3)
Chloride: 98 mmol/L (ref 98–111)
Creatinine, Ser: 0.92 mg/dL (ref 0.61–1.24)
GFR calc Af Amer: >60 mL/min (ref >60)
GFR calc non Af Amer: >60 mL/min (ref >60)
Glucose, Bld: 237 mg/dL — ABNORMAL HIGH (ref 70–99)
Potassium: 4.6 mmol/L (ref 3.5–5.1)
Sodium: 135 mmol/L (ref 135–145)
Total Bilirubin: 1.2 mg/dL (ref 0.3–1.2)
Total Protein: 5.8 g/dL — ABNORMAL LOW (ref 6.5–8.1)

## 2020-05-27 LAB — CBC
HCT: 48.2 % (ref 39.0–52.0)
Hemoglobin: 15.2 g/dL (ref 13.0–17.0)
MCH: 26.5 pg (ref 26.0–34.0)
MCHC: 31.5 g/dL (ref 30.0–36.0)
MCV: 84 fL (ref 80.0–100.0)
Platelets: 410 10*3/uL — ABNORMAL HIGH (ref 150–400)
RBC: 5.74 MIL/uL (ref 4.22–5.81)
RDW: 13.9 % (ref 11.5–15.5)
WBC: 14.9 10*3/uL — ABNORMAL HIGH (ref 4.0–10.5)
nRBC: 0 % (ref 0.0–0.2)

## 2020-05-27 LAB — GLUCOSE, CAPILLARY
Glucose-Capillary: 219 mg/dL — ABNORMAL HIGH (ref 70–99)
Glucose-Capillary: 272 mg/dL — ABNORMAL HIGH (ref 70–99)
Glucose-Capillary: 302 mg/dL — ABNORMAL HIGH (ref 70–99)
Glucose-Capillary: 160 mg/dL — ABNORMAL HIGH (ref 70–99)

## 2020-05-27 LAB — D-DIMER, QUANTITATIVE: D-Dimer, Quant: 13.09 ug/mL-FEU — ABNORMAL HIGH (ref 0.00–0.50)

## 2020-05-27 LAB — PROTIME-INR
INR: 2.6 — ABNORMAL HIGH (ref 0.8–1.2)
Prothrombin Time: 27.3 seconds — ABNORMAL HIGH (ref 11.4–15.2)

## 2020-05-27 MED ORDER — WARFARIN - PHARMACIST DOSING INPATIENT: Freq: Every day | Status: DC

## 2020-05-27 MED ORDER — METHYLPREDNISOLONE SODIUM SUCC 125 MG IJ SOLR
60.0000 mg | Freq: Two times a day (BID) | INTRAMUSCULAR | Status: DC
  Administered 2020-05-27 – 2020-05-29 (×4): 60 mg via INTRAVENOUS
  Filled 2020-05-27 (×4): qty 2

## 2020-05-27 MED ORDER — WARFARIN SODIUM 5 MG PO TABS
5.0000 mg | ORAL_TABLET | Freq: Once | ORAL | Status: AC
  Administered 2020-05-27: 5 mg via ORAL
  Filled 2020-05-27: qty 1

## 2020-05-27 NOTE — Progress Notes (Addendum)
Inpatient Diabetes Program Recommendations  AACE/ADA: New Consensus Statement on Inpatient Glycemic Control   Target Ranges:  Prepandial:   less than 140 mg/dL      Peak postprandial:   less than 180 mg/dL (1-2 hours)      Critically ill patients:  140 - 180 mg/dL   Results for OLUWAFERANMI, WAIN (MRN [id number]) as of 05/27/2020 10:05  Ref. Range 05/26/2020 07:41 05/26/2020 12:00 05/26/2020 16:21 05/26/2020 21:27 05/27/2020 07:39  Glucose-Capillary Latest Ref Range: 70 - 99 mg/dL 974 (H) 163 (H) 845 (H) 272 (H) 219 (H)  Results for IMAN, OROURKE (MRN [id number]) as of 05/27/2020 12:30  Ref. Range 05/19/2020 08:07  Hemoglobin A1C Latest Ref Range: 4.8 - 5.6 % 14.4 (H)   Review of Glycemic Control Outpatient DM medications: Lantus 30 units daily, Novolin R (Regular) TID with meals on sliding scale Current orders for Inpatient glycemic control: Lantus 85 units QAM, Lantus 50 units QHS, Novolog 12 units TID with meals, Novolog 0-20 units TID with meals, Novolog 0-5 units QHS, Tradjenta 5 mg daily; Solumedrol 60 mg Q8H  Inpatient Diabetes Program Recommendations:    Insulin: If steroids are continued as ordered, please consider increasing bedtime Lantus to 55 units QHS, and meal coverage to Novolog 17 units TID with meals.  Discharge DM medications: At time of discharge, please prescribe Novolin N (NPH) and Novolin R (Regular) with appropriate dosages as patient has no insurance and having to pay out of pocket for insulins (Lantus too expensive).   NOTE: Spoke with patient about diabetes and home regimen for diabetes control. Patient reports being followed by PCP [facility]) for diabetes management and currently taking Lantus 30 units daily and Novolin Regular TID with meals based on sliding scale as an outpatient for diabetes control. Patient reports taking DM medications as prescribed. Patient states that he was on Metformin 1000 mg BID and it was stopped due to  diarrhea as he could not tolerate the Metformin.  Patient reports that he was also told 2-3 months ago that one of his heart medications was contributing to hyperglycemia so that particular medication was changed.  Patient reports that Lantus is costing him between $300-400 each time he gets it refilled which is a Careers information officer. Patient states he was taking Novolog but had changed to Novolin R due to cost so he now gets Novolin R at [facility] for $25 per vial.  Patient prefers to use vial/syringe but notes he has used insulin pens as well. Patient states he has applied for medication assistance in the past but unable to get any assistance.  Patient notes that he is in the process of applying for disability. Patient states that his glucose ranges from 200-400's mg/dl and that his last Y2Q was 12% range.  Discussed A1C results (14.4% on 05/19/20) and explained that current A1C indicates an average glucose of 367 mg/dl over the past 2-3 months. Discussed importance of checking CBGs and maintaining good CBG control to prevent long-term and short-term complications. Discussed impact of nutrition, exercise, stress, sickness, and medications on diabetes control.  Asked patient if he was aware of Novolin N (NPH) which was also $25 per vial or $43 per box of 5 insulin pens at [facility] and patient stated he was not aware there was any other generic insulin other than the Regular. Discussed NPH and explained that it is typically taken BID due to duration of action.  Patient reports it would be much more affordable for  him if he could use generic insulins from [facility] for DM management.  Informed patient that it would be requested that when he is discharged that the provider prescribe Novolin N and Novolin R with appropriate dosages that he needs to take.  Patient appreciative of information provided and verbalized understanding of information discussed and reports no further questions at this time related to  diabetes.  Thanks, Orlando Penner, RN, MSN, CDE Diabetes Coordinator Inpatient Diabetes Program [phone] (Team Pager from 8am to 5pm)

## 2020-05-27 NOTE — Progress Notes (Addendum)
ANTICOAGULATION CONSULT NOTE  Pharmacy Consult for warfarin Indication: history DVT/PE  Labs: Recent Labs    05/25/20 0840 05/25/20 0840 05/26/20 0621 05/27/20 0911  HGB 14.5   < > 14.2 15.2  HCT 44.5  --  46.5 48.2  PLT 245  --  289 410*  LABPROT 36.4*  --  33.3* 27.3*  INR 3.8*  --  3.4* 2.6*  CREATININE 0.83  --  0.81 0.92   < > = values in this interval not displayed.    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 Admission INR subtherapeutic at 1.0 (down from 3.4 checked 8 days prior). Patient reports "itching" allergy to Lovenox but has received heparin here in the past per Epic charting. Patient reports he has failed Xarelto in the past. Noted, patient is uninsured. 9/26 LE dopplers negative for DVT. 9/27 CTA negative for PE. D-dimer previously >20, now improving to 13. Warfarin has been on hold since 9/24 due to elevated INR.  Original plan was to await INR dropping below 2.5 and starting Arixtra given elevated d-dimer. Per discussion with Dr. Jerral Ralph 9/29, no need for Arixtra and just resume warfarin today with patient overall improving and oxygenation improving. INR decreased to therapeutic at 2.6 today. CBC stable. No active bleed issues reported.  PTA warfarin dose - 10mg  daily except 5mg  on MWF  Goal of Therapy:  INR 2-3 Monitor platelets by anticoagulation protocol: Yes   Plan:  Warfarin 5mg  PO x 1 at 1600 Monitor daily INR, CBC, s/sx bleeding   , PharmD, BCPS Clinical Pharmacist 05/27/2020 2:33 PM

## 2020-05-27 NOTE — Progress Notes (Signed)
PROGRESS NOTE                                                                                                                                                                                                             Patient Demographics:    Marcus Joseph, is a 49 y.o. male, DOB - Jul 18, 1971, ZOX:[id number]  Outpatient Primary MD for the patient is [facility]   Admit date - 05/18/2020   LOS - 8  Chief Complaint  Patient presents with  . Shortness of Breath       Brief Narrative: Patient is a 49 y.o. male with PMHx of recurrent venous thromboembolism, LV thrombus, on chronic anticoagulation with warfarin-morbidly obese, DM-2-presented with shortness of breath-found to have acute hypoxic respiratory failure requiring HFNC in the setting of COVID-19 pneumonia.  COVID-19 vaccinated status: Unvaccinated  Significant Events: 9/20>> Admit to [facility] for severe hypoxemia due to COVID-19  Significant studies: 9/20>>Chest x-ray: Multifocal pneumonia 9/26>> bilateral lower extremity Doppler: No DVT 9/27>> CTA chest: No PE-diffuse bilateral groundglass opacities 11/13/2018>> Echo: EF 45-50%  COVID-19 medications: Steroids: 9/20>> Remdesivir: 9/20>> 9/24 Baricitinib: 9/22>>  Antibiotics: Rocephin: 9/22>> 9/26 Zithromax: 9/22>> 9/26  Microbiology data: 9/20 >>blood culture: No growth  Procedures: None  Consults: None  DVT prophylaxis: Coumadin on hold-due to supratherapeutic INR.    Subjective:    [facility] today feels much better-he is down to 5 L of oxygen.   Assessment  & Plan :   Acute Hypoxic Resp Failure due to Covid 19 Viral pneumonia: Had severe hypoxemia requiring HFNC during initial presentation to the hospital-treated with steroids/remdesivir/baricitinib-gradually improving-Down to 5 L of oxygen.  No signs of fluid overload-suspect we can hold off on diuretics at  this point.  Begin to taper down steroids further.  Continue to attempt to titrate down FiO2.  Fever: afebrile O2 requirements:  SpO2: 90 % O2 Flow Rate (L/min): 5 L/min FiO2 (%): 40 %   COVID-19 Labs: Recent Labs    05/25/20 0840 05/26/20 0621  DDIMER >20.00* >20.00*       Component Value Date/Time   BNP 14.5 06/07/2015 1455    No results for input(s): PROCALCITON in the last 168 hours.  Lab Results  Component Value Date   SARSCOV2NAA POSITIVE (A) 05/18/2020     Prone/Incentive Spirometry: encouraged incentive spirometry use  3-4/hour.  Significantly elevated D-dimer: CTA chest/lower extremity Dopplers negative for VTE-INR supratherapeutic-but with improving hypoxemia-suspect we can restart Coumadin-do not think we need to change anticoagulation to Arixtra.  HTN: BP stable-continue to hold antihypertensives.  DM-2 (A1c 14.4 on 9/21) with uncontrolled hyperglycemia due to steroids: CBGs relatively stable-continue Lantus 85 units in a.m., 50 units at bedtime, NovoLog 12 units with meals and SSI.  Suspect as steroids get tapered down-we will have less insulin requirement.  CBG (last 3)  Recent Labs    05/26/20 1621 05/26/20 2127 05/27/20 0739  GLUCAP 238* 272* 219*    History of LV thrombus/recurrent VTE: On anticoagulation with Coumadin-discussed with pharmacy-okay to resume Coumadin depending on INR.  Morbid Obesity: Estimated body mass index is 43.34 kg/m as calculated from the following:   Height as of this encounter: 5\' 10"  (1.778 m).   Weight as of this encounter: 137 kg.    GI prophylaxis: PPI  ABG:    Component Value Date/Time   PHART 7.441 05/20/2020 0224   PCO2ART 32.4 05/20/2020 0224   PO2ART 45.4 (L) 05/20/2020 0224   HCO3 21.7 05/20/2020 0224   TCO2 26 08/25/2018 1619   ACIDBASEDEF 1.8 05/20/2020 0224   O2SAT 79.8 05/20/2020 0224    Vent Settings: N/A FiO2 (%):  [40 %-50 %] 40 %  Condition -Guarded  Family Communication  : Patient to  update family himself.  Code Status :  Full Code  Diet :  Diet Order            Diet heart healthy/carb modified Room service appropriate? Yes; Fluid consistency: Thin  Diet effective now                  Disposition Plan  :   Status is: Inpatient  Remains inpatient appropriate because:Inpatient level of care appropriate due to severity of illness   Dispo: The patient is from: Home              Anticipated d/c is to: Home              Anticipated d/c date is: > 3 days              Patient currently is not medically stable to d/c.  Barriers to discharge: Hypoxia requiring O2 supplementation  Antimicorbials  :    Anti-infectives (From admission, onward)   Start     Dose/Rate Route Frequency Ordered Stop   05/21/20 1300  cefTRIAXone (ROCEPHIN) 2 g in sodium chloride 0.9 % 100 mL IVPB        2 g 200 mL/hr over 30 Minutes Intravenous Every 24 hours 05/21/20 0901 05/24/20 1246   05/21/20 1230  cefTRIAXone (ROCEPHIN) 2 g in sodium chloride 0.9 % 100 mL IVPB  Status:  Discontinued        2 g 200 mL/hr over 30 Minutes Intravenous Every 24 hours 05/20/20 1346 05/20/20 1349   05/21/20 1000  remdesivir 100 mg in sodium chloride 0.9 % 100 mL IVPB  Status:  Discontinued       "Followed by" Linked Group Details   100 mg 200 mL/hr over 30 Minutes Intravenous Daily 05/20/20 0139 05/20/20 0142   05/20/20 1349  cefTRIAXone (ROCEPHIN) 2 g in sodium chloride 0.9 % 100 mL IVPB  Status:  Discontinued        2 g 200 mL/hr over 30 Minutes Intravenous Every 24 hours 05/20/20 1349 05/21/20 0901   05/20/20 1300  azithromycin (ZITHROMAX) 500 mg in sodium chloride  0.9 % 250 mL IVPB        500 mg 250 mL/hr over 60 Minutes Intravenous Every 24 hours 05/20/20 1155 05/24/20 1402   05/20/20 1230  cefTRIAXone (ROCEPHIN) 1 g in sodium chloride 0.9 % 100 mL IVPB  Status:  Discontinued        1 g 200 mL/hr over 30 Minutes Intravenous Every 24 hours 05/20/20 1155 05/20/20 1346   05/20/20 0139  remdesivir  200 mg in sodium chloride 0.9% 250 mL IVPB  Status:  Discontinued       "Followed by" Linked Group Details   200 mg 580 mL/hr over 30 Minutes Intravenous Once 05/20/20 0139 05/20/20 0142   05/19/20 1000  remdesivir 100 mg in sodium chloride 0.9 % 100 mL IVPB       "Followed by" Linked Group Details   100 mg 200 mL/hr over 30 Minutes Intravenous Daily 05/18/20 2059 05/22/20 2103   05/18/20 2130  remdesivir 100 mg in sodium chloride 0.9 % 100 mL IVPB       "Followed by" Linked Group Details   100 mg 200 mL/hr over 30 Minutes Intravenous  Once 05/18/20 2059 05/18/20 2242   05/18/20 2100  remdesivir 100 mg in sodium chloride 0.9 % 100 mL IVPB       "Followed by" Linked Group Details   100 mg 200 mL/hr over 30 Minutes Intravenous  Once 05/18/20 2059 05/18/20 2205      Inpatient Medications  Scheduled Meds: . aspirin EC  81 mg Oral Daily  . baricitinib  4 mg Oral Daily  . influenza vac split quadrivalent PF  0.5 mL Intramuscular Tomorrow-1000  . insulin aspart  0-20 Units Subcutaneous TID WC  . insulin aspart  0-5 Units Subcutaneous QHS  . insulin aspart  12 Units Subcutaneous TID WC  . insulin glargine  50 Units Subcutaneous QHS  . insulin glargine  85 Units Subcutaneous Daily  . linagliptin  5 mg Oral Daily  . methylPREDNISolone (SOLU-MEDROL) injection  60 mg Intravenous Q8H  . pantoprazole  40 mg Oral BID AC   Continuous Infusions: PRN Meds:.acetaminophen, albuterol, guaiFENesin-dextromethorphan, senna-docusate, sodium chloride   Time Spent in minutes  25  See all Orders from today for further details   Jeoffrey Massed M.D on 05/27/2020 at 11:19 AM  To page go to [email or web address] - use universal password  [facility] -  Office  [phone]    Objective:   Vitals:   05/26/20 1955 05/27/20 0001 05/27/20 0358 05/27/20 0719  BP: 100/61 (!) 102/46 (!) 119/59 (!) 120/50  Pulse: 77 67 63 61  Resp: (!) 24  Temp: 97.7 F (36.5 C) 98 F (36.7 C) 97.8 F (36.6  C) 97.6 F (36.4 C)  TempSrc: Oral Oral Oral Oral  SpO2: 90% 92% 92% 90%  Weight:      Height:        Wt Readings from Last 3 Encounters:  05/25/20 (!) 137 kg  01/23/20 (!) 141.5 kg  10/30/19 (!) 142.9 kg     Intake/Output Summary (Last 24 hours) at 05/27/2020 1119 Last data filed at 05/27/2020 0981 Gross per 24 hour  Intake 982 ml  Output 2575 ml  Net -1593 ml     Physical Exam Gen Exam:Alert awake-not in any distress HEENT:atraumatic, normocephalic Chest: B/L clear to auscultation anteriorly CVS:S1S2 regular Abdomen:soft non tender, non distended Extremities:no edema Neurology: Non focal Skin: no rash   Data Review:    CBC Recent Labs  Lab 05/21/20  1610 05/21/20 9604 05/22/20 1032 05/22/20 1032 05/23/20 0238 05/24/20 0350 05/25/20 0840 05/26/20 0621 05/27/20 0911  WBC 13.9*   < > 14.5*   < > 14.1* 16.1* 17.0* 11.7* 14.9*  HGB 14.9   < > 15.7   < > 14.3 13.9 14.5 14.2 15.2  HCT 44.3   < > 47.4   < > 44.3 43.6 44.5 46.5 48.2  PLT 271   < > 271   < > 278 270 245 289 410*  MCV 82.6   < > 83.0   < > 83.1 83.4 84.0 87.4 84.0  MCH 27.8   < > 27.5   < > 26.8 26.6 27.4 26.7 26.5  MCHC 33.6   < > 33.1   < > 32.3 31.9 32.6 30.5 31.5  RDW 13.9   < > 14.0   < > 13.8 13.7 13.7 13.9 13.9  LYMPHSABS 1.3  --  1.3  --  1.1 0.7 0.6*  --   --   MONOABS 0.7  --  0.6  --  0.7 0.9 0.7  --   --   EOSABS 0.1  --  0.0  --  0.0 0.0 0.0  --   --   BASOSABS 0.1  --  0.1  --  0.0 0.0 0.1  --   --    < > = values in this interval not displayed.    Chemistries  Recent Labs  Lab 05/23/20 0238 05/24/20 0350 05/25/20 0840 05/26/20 0621 05/27/20 0911  NA 131* 135 136 135 135  K 4.2 4.3 4.5 4.4 4.6  CL 99 102 107 103 98  CO2 22 24 20* 17* 22  GLUCOSE 332* 176* 90 240* 237*  BUN 23* 19 17 21* 20  CREATININE 0.94 0.95 0.83 0.81 0.92  CALCIUM 7.8* 8.1* 8.0* 7.8* 8.2*  AST 30 22 24 20 29   ALT 29 23 21 23 21   ALKPHOS 73 69 62 64 65  BILITOT 0.7 0.6 0.8 0.7 1.2    ------------------------------------------------------------------------------------------------------------------ No results for input(s): CHOL, HDL, LDLCALC, TRIG, CHOLHDL, LDLDIRECT in the last 72 hours.  Lab Results  Component Value Date   HGBA1C 14.4 (H) 05/19/2020   ------------------------------------------------------------------------------------------------------------------ No results for input(s): TSH, T4TOTAL, T3FREE, THYROIDAB in the last 72 hours.  Invalid input(s): FREET3 ------------------------------------------------------------------------------------------------------------------ No results for input(s): VITAMINB12, FOLATE, FERRITIN, TIBC, IRON, RETICCTPCT in the last 72 hours.  Coagulation profile Recent Labs  Lab 05/22/20 1032 05/23/20 0238 05/24/20 0350 05/25/20 0840 05/26/20 0621  INR 1.9* 2.3* 3.2* 3.8* 3.4*    Recent Labs    05/25/20 0840 05/26/20 0621  DDIMER >20.00* >20.00*    Cardiac Enzymes No results for input(s): CKMB, TROPONINI, MYOGLOBIN in the last 168 hours.  Invalid input(s): CK ------------------------------------------------------------------------------------------------------------------    Component Value Date/Time   BNP 14.5 06/07/2015 1455    Micro Results Recent Results (from the past 240 hour(s))  SARS Coronavirus 2 by RT PCR (hospital order, performed in [facility] hospital lab) Nasopharyngeal Nasopharyngeal Swab     Status: Abnormal   Collection Time: 05/18/20  6:51 PM   Specimen: Nasopharyngeal Swab  Result Value Ref Range Status   SARS Coronavirus 2 POSITIVE (A) NEGATIVE Final    Comment: RESULT CALLED TO, READ BACK BY AND VERIFIED WITH: CALLED TO K.NEAL RN AT 1938 BY SROY ON [id number] (NOTE) SARS-CoV-2 target nucleic acids are DETECTED  SARS-CoV-2 RNA is generally detectable in upper respiratory specimens  during the acute phase of infection.  Positive results are  indicative  of the presence of the  identified virus, but do not rule out bacterial infection or co-infection with other pathogens not detected by the test.  Clinical correlation with patient history and  other diagnostic information is necessary to determine patient infection status.  The expected result is negative.  Fact Sheet for Patients:   BoilerBrush.com.cyhttps://www.fda.gov/media/136312/download   Fact Sheet for Healthcare Providers:   https://pope.com/https://www.fda.gov/media/136313/download    This test is not yet approved or cleared by the [place] FDA and  has been authorized for detection and/or diagnosis of SARS-CoV-2 by FDA under an Emergency Use Authorization (EUA).  This EUA will remain in effect (mean ing this test can be used) for the duration of  the COVID-19 declaration under Section 564(b)(1) of the Act, 21 U.S.C. section 360-bbb-3(b)(1), unless the authorization is terminated or revoked sooner.  Performed at [facility] [place], [address] [place], [place] [address]   Blood Culture (routine x 2)     Status: None   Collection Time: 05/18/20  7:26 PM   Specimen: BLOOD  Result Value Ref Range Status   Specimen Description   Final    BLOOD LEFT ANTECUBITAL Performed at [facility], [address] [place], [place] [address]    Special Requests   Final    BOTTLES DRAWN AEROBIC AND ANAEROBIC Blood Culture adequate volume Performed at [facility] [place], [address] [place], [place] [address]    Culture   Final    NO GROWTH 5 DAYS Performed at [facility], [address] [place], [place] [address]    Report Status 05/23/2020 FINAL  Final  Blood Culture (routine x 2)     Status: None   Collection Time: 05/18/20  9:39 PM   Specimen: BLOOD LEFT WRIST  Result Value Ref Range Status   Specimen Description   Final    BLOOD LEFT WRIST Performed at [facility] [place], [address] [place], [place] [address]    Special Requests   Final    BOTTLES DRAWN AEROBIC AND ANAEROBIC Blood  Culture adequate volume Performed at [facility] [place], [address] [place], [place] [address]    Culture   Final    NO GROWTH 5 DAYS Performed at [facility], [address] [place], [place] [address]    Report Status 05/24/2020 FINAL  Final    Radiology Reports CT ANGIO CHEST PE W OR WO CONTRAST  Result Date: 05/25/2020 CLINICAL DATA:  COVID-19 patient with history of pulmonary embolus and left ventricular thrombus. Elevated D-dimers. Suspected pulmonary embolus with high probability. EXAM: CT ANGIOGRAPHY CHEST WITH CONTRAST TECHNIQUE: Multidetector CT imaging of the chest was performed using the standard protocol during bolus administration of intravenous contrast. Multiplanar CT image reconstructions and MIPs were obtained to evaluate the vascular anatomy. CONTRAST:  100mL OMNIPAQUE IOHEXOL 350 MG/ML SOLN COMPARISON:  08/18/2018 FINDINGS: Cardiovascular: Motion artifact limits examination. Moderately good opacification of the central and segmental pulmonary arteries. No filling defects identified suggesting no evidence of significant central pulmonary embolus. Peripheral pulmonary arteries may be obscured. Normal heart size. The previous left ventricular thrombus is resolved. No pericardial effusions. Pericardial calcification along the apex. Normal caliber thoracic aorta. 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 Mediastinum/Nodes: Surgical clips. Esophagus is decompressed. No significant lymphadenopathy. Thyroid gland is unremarkable. Lungs/Pleura: Diffuse bilateral ground-glass airspace infiltration throughout both lungs. This could represent multifocal COVID pneumonia or edema. No pleural  effusions. No pneumothorax. Airways are patent. Upper Abdomen: No acute abnormalities demonstrated in the visualized upper abdomen. Musculoskeletal: Degenerative changes in the spine. Sternotomy wires. No acute displaced fractures. Review of the MIP images confirms the  above findings. IMPRESSION: 1. No evidence of significant central pulmonary embolus. The previous left ventricular thrombus is resolved. 2. Diffuse bilateral ground-glass airspace infiltration throughout both lungs could represent multifocal COVID pneumonia or edema. Electronically Signed   By: Burman Nieves M.D.   On: 05/25/2020 04:57   DG Chest Portable 1 View  Result Date: 05/18/2020 CLINICAL DATA:  Shortness of breath, rule out COVID. EXAM: PORTABLE CHEST 1 VIEW COMPARISON:  Chest x-ray 09/27/2018. FINDINGS: Sternotomy wires are again noted. The heart size and mediastinal contours are within normal limits. Diffuse hazy airspace opacities that are more prominent in the peripheral left lung. No pulmonary edema. No pleural effusion. No pneumothorax. No acute osseous abnormality. IMPRESSION: Multifocal pneumonia likely representing known COVID-19 infection Electronically Signed   By: Tish Frederickson M.D.   On: 05/18/2020 20:17   VAS [place] LOWER EXTREMITY VENOUS (DVT)  Result Date: 05/24/2020  Lower Venous DVTStudy Indications: Elevated d-dimer.  Risk Factors: DVT Personal history. Anticoagulation: Coumadin. Limitations: Poor ultrasound/tissue interface. Comparison Study: 01-24-2020 Prior LT lower venous                   08-19-2018 Prior RT lower venous Performing Technologist: Jean Rosenthal  Examination Guidelines: A complete evaluation includes B-mode imaging, spectral Doppler, color Doppler, and power Doppler as needed of all accessible portions of each vessel. Bilateral testing is considered an integral part of a complete examination. Limited examinations for reoccurring indications may be performed as noted. The reflux portion of the exam is performed with the patient in reverse Trendelenburg.  +---------+---------------+---------+-----------+----------+---------------+ RIGHT    CompressibilityPhasicitySpontaneityPropertiesThrombus Aging   +---------+---------------+---------+-----------+----------+---------------+ CFV      Full           Yes      Yes                                  +---------+---------------+---------+-----------+----------+---------------+ SFJ      Full                                                         +---------+---------------+---------+-----------+----------+---------------+ FV Prox  Full                                                         +---------+---------------+---------+-----------+----------+---------------+ FV Mid   Full                                                         +---------+---------------+---------+-----------+----------+---------------+ FV DistalFull                                                         +---------+---------------+---------+-----------+----------+---------------+  PFV      Full                                                         +---------+---------------+---------+-----------+----------+---------------+ POP      Full           Yes      Yes                                  +---------+---------------+---------+-----------+----------+---------------+ PTV      Full                                                         +---------+---------------+---------+-----------+----------+---------------+ PERO                                                  Patent by color +---------+---------------+---------+-----------+----------+---------------+ Gastroc  Full                                                         +---------+---------------+---------+-----------+----------+---------------+   +---------+---------------+---------+-----------+----------+-------------------+ LEFT     CompressibilityPhasicitySpontaneityPropertiesThrombus Aging      +---------+---------------+---------+-----------+----------+-------------------+ CFV      Full           Yes      Yes                                       +---------+---------------+---------+-----------+----------+-------------------+ SFJ      Full                                                             +---------+---------------+---------+-----------+----------+-------------------+ FV Prox  Full                                                             +---------+---------------+---------+-----------+----------+-------------------+ FV Mid   Full                                                             +---------+---------------+---------+-----------+----------+-------------------+ FV DistalFull                                                             +---------+---------------+---------+-----------+----------+-------------------+  PFV      Full                                                             +---------+---------------+---------+-----------+----------+-------------------+ POP      Full           Yes      Yes                                      +---------+---------------+---------+-----------+----------+-------------------+ PTV      Full                                                             +---------+---------------+---------+-----------+----------+-------------------+ PERO     Full                                         Some segments not                                                         well visualized     +---------+---------------+---------+-----------+----------+-------------------+ Gastroc  Full                                                             +---------+---------------+---------+-----------+----------+-------------------+     Summary: RIGHT: - There is no evidence of deep vein thrombosis in the lower extremity. However, portions of this examination were limited- see technologist comments above.  - No cystic structure found in the popliteal fossa.  LEFT: - There is no evidence of deep vein thrombosis in the lower extremity. However, portions of  this examination were limited- see technologist comments above.  - No cystic structure found in the popliteal fossa.  *See table(s) above for measurements and observations. Electronically signed by Waverly Ferrari MD on 05/24/2020 at 3:16:29 PM.    Final

## 2020-05-27 NOTE — Evaluation (Signed)
Occupational Therapy Evaluation Patient Details Name: Marcus Joseph. MRN: [id number] DOB: Mar 06, 1971 Today's Date: 05/27/2020    History of Present Illness Pt is 49 yo male with PMH of DM2, morbid obesity, L TKA, CABG, and hx of DVTs and PE. Pt presented with SOB.  He was found to be COVID 19 + and admited with hypoxic resp failure due to COVID 19 PNE. CTA and doppler negative for DVTs or PEs.   Clinical Impression   PTA pt living with girlfriend and requiring as needed assist for ADL/IADL. He reports occasionally needing increased assist due to his hx of DVT causing sudden onsets of SOB. At time of eval, pt presents with ability to complete sit <> stands at supervision level. He was then able to complete mobility to bathroom for toilet transfer at supervision level. He then continued to complete mobility in the hallway. ECS training was provided to patient at this time to improve activity tolerance and independence. Pt on 4L HFNC at start of session, ultimately needing 6L HFNC to finish mobility and maintain SpO2 >83%. Given current status, anticipate pt will progress well without OT follow up. Will continue to follow per POC listed below.  Vitals: Pt required 6L HFNC to maintain O2 sats >83% with mobility, able to titrate back to 4L HFNC at rest.     Follow Up Recommendations  No OT follow up;Supervision - Intermittent    Equipment Recommendations  None recommended by OT    Recommendations for Other Services       Precautions / Restrictions Precautions Precautions: Other (comment) Precaution Comments: watch O2 Restrictions Weight Bearing Restrictions: No      Mobility Bed Mobility               General bed mobility comments: up in chair, returned to chair  Transfers Overall transfer level: Needs assistance Equipment used: None Transfers: Sit to/from Stand Sit to Stand: Supervision              Balance Overall balance assessment: Needs  assistance Sitting-balance support: No upper extremity supported Sitting balance-Leahy Scale: Good     Standing balance support: No upper extremity supported Standing balance-Leahy Scale: Good                             ADL either performed or assessed with clinical judgement   ADL                                         General ADL Comments: Pt is able to complete ADL at supervision level this date. Pt demonstrated toilet transfer, posterior peri care, and household functional mobility all at supervision level. Introduced energy conservation strategies, pt continues to require cues for implementation.     Vision Baseline Vision/History: Wears glasses Wears Glasses: At all times Patient Visual Report: No change from baseline       Perception     Praxis      Pertinent Vitals/Pain Pain Assessment: No/denies pain     Hand Dominance     Extremity/Trunk Assessment Upper Extremity Assessment Upper Extremity Assessment: Overall WFL for tasks assessed   Lower Extremity Assessment Lower Extremity Assessment: Overall WFL for tasks assessed       Communication Communication Communication: No difficulties   Cognition Arousal/Alertness: Awake/alert Behavior During Therapy: WFL for tasks assessed/performed Overall Cognitive Status:  Within Functional Limits for tasks assessed                                     General Comments       Exercises     Shoulder Instructions      Home Living Family/patient expects to be discharged to:: Private residence Living Arrangements: Spouse/significant other (girlfriend) Available Help at Discharge: Family;Available 24 hours/day Type of Home: House Home Access: Stairs to enter [employer] of Steps: 2 Entrance Stairs-Rails: None Home Layout: One level     Bathroom Shower/Tub: Chief Strategy Officer: Standard     Home Equipment: Emergency planning/management officer - 2  wheels;Bedside commode          Prior Functioning/Environment Level of Independence: Needs assistance  Gait / Transfers Assistance Needed: Reports some difficulties with walking due to recent medical issues (hx of PE).  Some days can only ambulate in house and other days can go further ADL's / Homemaking Assistance Needed: Is bathing at sink or on shower chair - girlfriend assist with getting in shower; can toilet and dress independently; pt can assist with IADLs but has to sit   Comments: Reports sciatic nerve issues and DVT/PEs (07/2018) - trying to get disability; Report 1-2 falls in past couple of years        OT Problem List: Decreased knowledge of use of DME or AE;Decreased knowledge of precautions;Decreased activity tolerance;Cardiopulmonary status limiting activity      OT Treatment/Interventions: Self-care/ADL training;Therapeutic exercise;Patient/family education;Balance training;Energy conservation;Therapeutic activities;DME and/or AE instruction    OT Goals(Current goals can be found in the care plan section) Acute Rehab OT Goals Patient Stated Goal: return home OT Goal Formulation: With patient Time For Goal Achievement: 06/10/20 Potential to Achieve Goals: Good  OT Frequency: Min 2X/week   Barriers to D/C:            Co-evaluation              AM-PAC OT "6 Clicks" Daily Activity     Outcome Measure Help from another person eating meals?: None Help from another person taking care of personal grooming?: None Help from another person toileting, which includes using toliet, bedpan, or urinal?: A Little Help from another person bathing (including washing, rinsing, drying)?: A Little Help from another person to put on and taking off regular upper body clothing?: A Little Help from another person to put on and taking off regular lower body clothing?: A Little 6 Click Score: 20   End of Session Equipment Utilized During Treatment: Oxygen Nurse Communication:  Mobility status  Activity Tolerance: Patient tolerated treatment well Patient left: in chair;with call bell/phone within reach  OT Visit Diagnosis: Unsteadiness on feet (R26.81);Other abnormalities of gait and mobility (R26.89)                Time: 5361-4431 OT Time Calculation (min): 34 min Charges:  OT General Charges $OT Visit: 1 Visit OT Evaluation $OT Eval Moderate Complexity: 1 Mod OT Treatments $Self Care/Home Management : 8-22 mins  Dalphine Handing, MSOT, OTR/L Acute Rehabilitation Services [facility] Office Number: [phone] Pager: [phone]  Dalphine Handing 05/27/2020, 12:23 PM

## 2020-05-28 LAB — COMPREHENSIVE METABOLIC PANEL
ALT: 28 U/L (ref 0–44)
AST: 26 U/L (ref 15–41)
Albumin: 2.2 g/dL — ABNORMAL LOW (ref 3.5–5.0)
Alkaline Phosphatase: 54 U/L (ref 38–126)
Anion gap: 11 (ref 5–15)
BUN: 17 mg/dL (ref 6–20)
CO2: 25 mmol/L (ref 22–32)
Calcium: 8.1 mg/dL — ABNORMAL LOW (ref 8.9–10.3)
Chloride: 98 mmol/L (ref 98–111)
Creatinine, Ser: 1.02 mg/dL (ref 0.61–1.24)
GFR calc Af Amer: >60 mL/min (ref >60)
GFR calc non Af Amer: >60 mL/min (ref >60)
Glucose, Bld: 262 mg/dL — ABNORMAL HIGH (ref 70–99)
Potassium: 4.5 mmol/L (ref 3.5–5.1)
Sodium: 134 mmol/L — ABNORMAL LOW (ref 135–145)
Total Bilirubin: 0.5 mg/dL (ref 0.3–1.2)
Total Protein: 5.5 g/dL — ABNORMAL LOW (ref 6.5–8.1)

## 2020-05-28 LAB — CBC
HCT: 45.6 % (ref 39.0–52.0)
Hemoglobin: 15 g/dL (ref 13.0–17.0)
MCH: 27.7 pg (ref 26.0–34.0)
MCHC: 32.9 g/dL (ref 30.0–36.0)
MCV: 84.1 fL (ref 80.0–100.0)
Platelets: 471 10*3/uL — ABNORMAL HIGH (ref 150–400)
RBC: 5.42 MIL/uL (ref 4.22–5.81)
RDW: 14 % (ref 11.5–15.5)
WBC: 16.2 10*3/uL — ABNORMAL HIGH (ref 4.0–10.5)
nRBC: 0 % (ref 0.0–0.2)

## 2020-05-28 LAB — D-DIMER, QUANTITATIVE: D-Dimer, Quant: 7.45 ug/mL-FEU — ABNORMAL HIGH (ref 0.00–0.50)

## 2020-05-28 LAB — GLUCOSE, CAPILLARY
Glucose-Capillary: 91 mg/dL (ref 70–99)
Glucose-Capillary: 205 mg/dL — ABNORMAL HIGH (ref 70–99)
Glucose-Capillary: 236 mg/dL — ABNORMAL HIGH (ref 70–99)
Glucose-Capillary: 256 mg/dL — ABNORMAL HIGH (ref 70–99)
Glucose-Capillary: 172 mg/dL — ABNORMAL HIGH (ref 70–99)

## 2020-05-28 LAB — PROTIME-INR
INR: 2.4 — ABNORMAL HIGH (ref 0.8–1.2)
Prothrombin Time: 24.9 seconds — ABNORMAL HIGH (ref 11.4–15.2)

## 2020-05-28 MED ORDER — WARFARIN SODIUM 5 MG PO TABS
5.0000 mg | ORAL_TABLET | Freq: Once | ORAL | Status: AC
  Administered 2020-05-28: 5 mg via ORAL
  Filled 2020-05-28: qty 1

## 2020-05-28 MED ORDER — FUROSEMIDE 10 MG/ML IJ SOLN
20.0000 mg | Freq: Once | INTRAMUSCULAR | Status: AC
  Administered 2020-05-28: 20 mg via INTRAVENOUS
  Filled 2020-05-28: qty 2

## 2020-05-28 NOTE — Progress Notes (Signed)
SATURATION QUALIFICATIONS: (This note is used to comply with regulatory documentation for home oxygen)  Patient Saturations on Room Air at Rest =93%  Patient Saturations on Room Air while Ambulating = 89%  Patient Saturations on 2 Liters of oxygen while Ambulating = 95%  Please briefly explain why patient needs home oxygen:  Pt does not appear to need oxygen for home. Pt maintained 89-92% while ambulating.

## 2020-05-28 NOTE — Progress Notes (Signed)
PROGRESS NOTE                                                                                                                                                                                                             Patient Demographics:    Marcus Joseph, is a 49 y.o. male, DOB - 1970-12-08, MWN:[id number]  Outpatient Primary MD for the patient is [facility]   Admit date - 05/18/2020   LOS - 9  Chief Complaint  Patient presents with  . Shortness of Breath       Brief Narrative: Patient is a 49 y.o. male with PMHx of recurrent venous thromboembolism, LV thrombus, on chronic anticoagulation with warfarin-morbidly obese, DM-2-presented with shortness of breath-found to have acute hypoxic respiratory failure requiring HFNC in the setting of COVID-19 pneumonia.  COVID-19 vaccinated status: Unvaccinated  Significant Events: 9/20>> Admit to [facility] for severe hypoxemia due to COVID-19  Significant studies: 9/20>>Chest x-ray: Multifocal pneumonia 9/26>> bilateral lower extremity Doppler: No DVT 9/27>> CTA chest: No PE-diffuse bilateral groundglass opacities 11/13/2018>> Echo: EF 45-50%  COVID-19 medications: Steroids: 9/20>> Remdesivir: 9/20>> 9/24 Baricitinib: 9/22>>  Antibiotics: Rocephin: 9/22>> 9/26 Zithromax: 9/22>> 9/26  Microbiology data: 9/20 >>blood culture: No growth  Procedures: None  Consults: None  DVT prophylaxis: Coumadin on hold-due to supratherapeutic INR.    Subjective:   Feels better-he is rapidly improving-Down to 3 L of oxygen at rest (was on 5 L yesterday).  Still acknowledges some amount of shortness of breath with exertion.   Assessment  & Plan :   Acute Hypoxic Resp Failure due to Covid 19 Viral pneumonia: Had severe hypoxemia requiring heated high flow plus NRB on initial presentation-but has rapidly improved over the past several days.  He is now  down to just 3 L of oxygen at rest (was on 5 L yesterday).  Continue tapering Solu-Medrol-remains on baricitinib.  Has finished a course of Remdesivir and empiric IV antibiotics (although doubt bacterial pneumonia).  Some mild lower extremity edema today-we will give 1 dose of IV Lasix to ensure negative balance.  Continue to attempt to titrate down FiO2 further.  Suspect may require home O2-we will ask RN to perform desaturation screen.   Fever: afebrile O2 requirements:  SpO2: 98 % O2 Flow Rate (L/min): 3 L/min FiO2 (%): 40 %   COVID-19  Labs: Recent Labs    05/26/20 0621 05/27/20 0911 05/28/20 1126  DDIMER >20.00* 13.09* 7.45*       Component Value Date/Time   BNP 14.5 06/07/2015 1455    No results for input(s): PROCALCITON in the last 168 hours.  Lab Results  Component Value Date   SARSCOV2NAA POSITIVE (A) 05/18/2020     Prone/Incentive Spirometry: encouraged incentive spirometry use 3-4/hour.  Significantly elevated D-dimer: CTA chest/lower extremity Dopplers negative for VTE-chronically on Coumadin-INR has been supratherapeutic-but with improving hypoxemia-suspect that no further work-up is required.  Do not think we need to change to a alternate anticoagulant agent.  HTN: BP stable-continue to hold antihypertensives.  DM-2 (A1c 14.4 on 9/21) with uncontrolled hyperglycemia due to steroids: CBGs relatively stable-continue Lantus 85 units in a.m., 50 units at bedtime, NovoLog 12 units with meals and SSI.  Suspect as steroids get tapered down-we will have less insulin requirement.  CBG (last 3)  Recent Labs    05/27/20 2312 05/28/20 0830 05/28/20 1305  GLUCAP 160* 91 236*    History of LV thrombus/recurrent VTE: On Coumadin-INR now therapeutic-pharmacy to dose.  Morbid Obesity: Estimated body mass index is 43.34 kg/m as calculated from the following:   Height as of this encounter: 5\' 10"  (1.778 m).   Weight as of this encounter: 137 kg.    GI prophylaxis:  PPI  ABG:    Component Value Date/Time   PHART 7.441 05/20/2020 0224   PCO2ART 32.4 05/20/2020 0224   PO2ART 45.4 (L) 05/20/2020 0224   HCO3 21.7 05/20/2020 0224   TCO2 26 08/25/2018 1619   ACIDBASEDEF 1.8 05/20/2020 0224   O2SAT 79.8 05/20/2020 0224    Vent Settings: N/A    Condition -Guarded  Family Communication  : Patient to update family himself.  Code Status :  Full Code  Diet :  Diet Order            Diet heart healthy/carb modified Room service appropriate? Yes; Fluid consistency: Thin  Diet effective now                  Disposition Plan  :   Status is: Inpatient  Remains inpatient appropriate because:Inpatient level of care appropriate due to severity of illness   Dispo: The patient is from: Home              Anticipated d/c is to: Home              Anticipated d/c date is: 1-2 days              Patient currently is not medically stable to d/c.  Barriers to discharge: Hypoxia requiring O2 supplementation  Antimicorbials  :    Anti-infectives (From admission, onward)   Start     Dose/Rate Route Frequency Ordered Stop   05/21/20 1300  cefTRIAXone (ROCEPHIN) 2 g in sodium chloride 0.9 % 100 mL IVPB        2 g 200 mL/hr over 30 Minutes Intravenous Every 24 hours 05/21/20 0901 05/24/20 1246   05/21/20 1230  cefTRIAXone (ROCEPHIN) 2 g in sodium chloride 0.9 % 100 mL IVPB  Status:  Discontinued        2 g 200 mL/hr over 30 Minutes Intravenous Every 24 hours 05/20/20 1346 05/20/20 1349   05/21/20 1000  remdesivir 100 mg in sodium chloride 0.9 % 100 mL IVPB  Status:  Discontinued       "Followed by" Linked Group Details   100 mg 200  mL/hr over 30 Minutes Intravenous Daily 05/20/20 0139 05/20/20 0142   05/20/20 1349  cefTRIAXone (ROCEPHIN) 2 g in sodium chloride 0.9 % 100 mL IVPB  Status:  Discontinued        2 g 200 mL/hr over 30 Minutes Intravenous Every 24 hours 05/20/20 1349 05/21/20 0901   05/20/20 1300  azithromycin (ZITHROMAX) 500 mg in sodium  chloride 0.9 % 250 mL IVPB        500 mg 250 mL/hr over 60 Minutes Intravenous Every 24 hours 05/20/20 1155 05/24/20 1402   05/20/20 1230  cefTRIAXone (ROCEPHIN) 1 g in sodium chloride 0.9 % 100 mL IVPB  Status:  Discontinued        1 g 200 mL/hr over 30 Minutes Intravenous Every 24 hours 05/20/20 1155 05/20/20 1346   05/20/20 0139  remdesivir 200 mg in sodium chloride 0.9% 250 mL IVPB  Status:  Discontinued       "Followed by" Linked Group Details   200 mg 580 mL/hr over 30 Minutes Intravenous Once 05/20/20 0139 05/20/20 0142   05/19/20 1000  remdesivir 100 mg in sodium chloride 0.9 % 100 mL IVPB       "Followed by" Linked Group Details   100 mg 200 mL/hr over 30 Minutes Intravenous Daily 05/18/20 2059 05/22/20 2103   05/18/20 2130  remdesivir 100 mg in sodium chloride 0.9 % 100 mL IVPB       "Followed by" Linked Group Details   100 mg 200 mL/hr over 30 Minutes Intravenous  Once 05/18/20 2059 05/18/20 2242   05/18/20 2100  remdesivir 100 mg in sodium chloride 0.9 % 100 mL IVPB       "Followed by" Linked Group Details   100 mg 200 mL/hr over 30 Minutes Intravenous  Once 05/18/20 2059 05/18/20 2205      Inpatient Medications  Scheduled Meds: . aspirin EC  81 mg Oral Daily  . baricitinib  4 mg Oral Daily  . influenza vac split quadrivalent PF  0.5 mL Intramuscular Tomorrow-1000  . insulin aspart  0-20 Units Subcutaneous TID WC  . insulin aspart  0-5 Units Subcutaneous QHS  . insulin aspart  12 Units Subcutaneous TID WC  . insulin glargine  50 Units Subcutaneous QHS  . insulin glargine  85 Units Subcutaneous Daily  . linagliptin  5 mg Oral Daily  . methylPREDNISolone (SOLU-MEDROL) injection  60 mg Intravenous Q12H  . pantoprazole  40 mg Oral BID AC  . Warfarin - Pharmacist Dosing Inpatient   Does not apply q1600   Continuous Infusions: PRN Meds:.acetaminophen, albuterol, guaiFENesin-dextromethorphan, senna-docusate, sodium chloride   Time Spent in minutes  25  See all  Orders from today for further details   Jeoffrey Massed M.D on 05/28/2020 at 1:38 PM  To page go to [email or web address] - use universal password  [facility] -  Office  [phone]    Objective:   Vitals:   05/27/20 1300 05/27/20 2022 05/28/20 0454 05/28/20 1100  BP: (!) 103/59 113/62 124/65 102/63  Pulse: 83 74 76 74  Resp: (!) 22 14 10    Temp: 97.6 F (36.4 C) 98.7 F (37.1 C) 98.5 F (36.9 C) 97.7 F (36.5 C)  TempSrc: Oral Oral Oral Oral  SpO2: 94% 100% 98%   Weight:      Height:        Wt Readings from Last 3 Encounters:  05/25/20 (!) 137 kg  01/23/20 (!) 141.5 kg  10/30/19 (!) 142.9 kg     Intake/Output Summary (  Last 24 hours) at 05/28/2020 1338 Last data filed at 05/28/2020 1250 Gross per 24 hour  Intake 942 ml  Output 2625 ml  Net -1683 ml     Physical Exam Gen Exam:Alert awake-not in any distress HEENT:atraumatic, normocephalic Chest: B/L clear to auscultation anteriorly CVS:S1S2 regular Abdomen:soft non tender, non distended Extremities:trace edema Neurology: Non focal Skin: no rash   Data Review:    CBC Recent Labs  Lab 05/22/20 1032 05/22/20 1032 05/23/20 0238 05/23/20 0238 05/24/20 0350 05/25/20 0840 05/26/20 0621 05/27/20 0911 05/28/20 1126  WBC 14.5*   < > 14.1*   < > 16.1* 17.0* 11.7* 14.9* 16.2*  HGB 15.7   < > 14.3   < > 13.9 14.5 14.2 15.2 15.0  HCT 47.4   < > 44.3   < > 43.6 44.5 46.5 48.2 45.6  PLT 271   < > 278   < > 270 245 289 410* 471*  MCV 83.0   < > 83.1   < > 83.4 84.0 87.4 84.0 84.1  MCH 27.5   < > 26.8   < > 26.6 27.4 26.7 26.5 27.7  MCHC 33.1   < > 32.3   < > 31.9 32.6 30.5 31.5 32.9  RDW 14.0   < > 13.8   < > 13.7 13.7 13.9 13.9 14.0  LYMPHSABS 1.3  --  1.1  --  0.7 0.6*  --   --   --   MONOABS 0.6  --  0.7  --  0.9 0.7  --   --   --   EOSABS 0.0  --  0.0  --  0.0 0.0  --   --   --   BASOSABS 0.1  --  0.0  --  0.0 0.1  --   --   --    < > = values in this interval not displayed.    Chemistries  Recent  Labs  Lab 05/24/20 0350 05/25/20 0840 05/26/20 0621 05/27/20 0911 05/28/20 1126  NA 135 136 135 135 134*  K 4.3 4.5 4.4 4.6 4.5  CL 102 107 103 98 98  CO2 24 20* 17* 22 25  GLUCOSE 176* 90 240* 237* 262*  BUN 19 17 21* 20 17  CREATININE 0.95 0.83 0.81 0.92 1.02  CALCIUM 8.1* 8.0* 7.8* 8.2* 8.1*  AST 22 24 20 29 26   ALT 23 21 23 21 28   ALKPHOS 69 62 64 65 54  BILITOT 0.6 0.8 0.7 1.2 0.5   ------------------------------------------------------------------------------------------------------------------ No results for input(s): CHOL, HDL, LDLCALC, TRIG, CHOLHDL, LDLDIRECT in the last 72 hours.  Lab Results  Component Value Date   HGBA1C 14.4 (H) 05/19/2020   ------------------------------------------------------------------------------------------------------------------ No results for input(s): TSH, T4TOTAL, T3FREE, THYROIDAB in the last 72 hours.  Invalid input(s): FREET3 ------------------------------------------------------------------------------------------------------------------ No results for input(s): VITAMINB12, FOLATE, FERRITIN, TIBC, IRON, RETICCTPCT in the last 72 hours.  Coagulation profile Recent Labs  Lab 05/24/20 0350 05/25/20 0840 05/26/20 0621 05/27/20 0911 05/28/20 1126  INR 3.2* 3.8* 3.4* 2.6* 2.4*    Recent Labs    05/27/20 0911 05/28/20 1126  DDIMER 13.09* 7.45*    Cardiac Enzymes No results for input(s): CKMB, TROPONINI, MYOGLOBIN in the last 168 hours.  Invalid input(s): CK ------------------------------------------------------------------------------------------------------------------    Component Value Date/Time   BNP 14.5 06/07/2015 1455    Micro Results Recent Results (from the past 240 hour(s))  SARS Coronavirus 2 by RT PCR (hospital order, performed in [facility] hospital lab) Nasopharyngeal Nasopharyngeal Swab  Status: Abnormal   Collection Time: 05/18/20  6:51 PM   Specimen: Nasopharyngeal Swab  Result Value Ref  Range Status   SARS Coronavirus 2 POSITIVE (A) NEGATIVE Final    Comment: RESULT CALLED TO, READ BACK BY AND VERIFIED WITH: CALLED TO K.NEAL RN AT 1938 BY SROY ON [id number] (NOTE) SARS-CoV-2 target nucleic acids are DETECTED  SARS-CoV-2 RNA is generally detectable in upper respiratory specimens  during the acute phase of infection.  Positive results are indicative  of the presence of the identified virus, but do not rule out bacterial infection or co-infection with other pathogens not detected by the test.  Clinical correlation with patient history and  other diagnostic information is necessary to determine patient infection status.  The expected result is negative.  Fact Sheet for Patients:   BoilerBrush.com.cy   Fact Sheet for Healthcare Providers:   https://pope.com/    This test is not yet approved or cleared by the [place] FDA and  has been authorized for detection and/or diagnosis of SARS-CoV-2 by FDA under an Emergency Use Authorization (EUA).  This EUA will remain in effect (mean ing this test can be used) for the duration of  the COVID-19 declaration under Section 564(b)(1) of the Act, 21 U.S.C. section 360-bbb-3(b)(1), unless the authorization is terminated or revoked sooner.  Performed at [facility], [address] [place], [place] [address]   Blood Culture (routine x 2)     Status: None   Collection Time: 05/18/20  7:26 PM   Specimen: BLOOD  Result Value Ref Range Status   Specimen Description   Final    BLOOD LEFT ANTECUBITAL Performed at [facility], [address] [place], [place] [address]    Special Requests   Final    BOTTLES DRAWN AEROBIC AND ANAEROBIC Blood Culture adequate volume Performed at [facility], [address] [place], [place] [address]    Culture   Final    NO GROWTH 5 DAYS Performed at [facility], [address] [place], [place] [address]     Report Status 05/23/2020 FINAL  Final  Blood Culture (routine x 2)     Status: None   Collection Time: 05/18/20  9:39 PM   Specimen: BLOOD LEFT WRIST  Result Value Ref Range Status   Specimen Description   Final    BLOOD LEFT WRIST Performed at [facility], [address] [place], [place] [address]    Special Requests   Final    BOTTLES DRAWN AEROBIC AND ANAEROBIC Blood Culture adequate volume Performed at [facility], [address] [place], [place] [address]    Culture   Final    NO GROWTH 5 DAYS Performed at [facility], [address], [place], [place] [address]    Report Status 05/24/2020 FINAL  Final    Radiology Reports CT ANGIO CHEST PE W OR WO CONTRAST  Result Date: 05/25/2020 CLINICAL DATA:  COVID-19 patient with history of pulmonary embolus and left ventricular thrombus. Elevated D-dimers. Suspected pulmonary embolus with high probability. EXAM: CT ANGIOGRAPHY CHEST WITH CONTRAST TECHNIQUE: Multidetector CT imaging of the chest was performed using the standard protocol during bolus administration of intravenous contrast. Multiplanar CT image reconstructions and MIPs were obtained to evaluate the vascular anatomy. CONTRAST:  OMNIPAQUE IOHEXOL 350 MG/ML SOLN COMPARISON:  08/18/2018 FINDINGS: Cardiovascular: Motion artifact limits examination. Moderately good opacification of the central and segmental pulmonary  arteries. No filling defects identified suggesting no evidence of significant central pulmonary embolus. Peripheral pulmonary arteries may be obscured. Normal heart size. The previous left ventricular thrombus is resolved. No pericardial effusions. Pericardial calcification along the apex. Normal caliber thoracic aorta. 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 Mediastinum/Nodes: Surgical clips. Esophagus is decompressed. No significant lymphadenopathy. Thyroid gland is unremarkable. Lungs/Pleura: Diffuse bilateral  ground-glass airspace infiltration throughout both lungs. This could represent multifocal COVID pneumonia or edema. No pleural effusions. No pneumothorax. Airways are patent. Upper Abdomen: No acute abnormalities demonstrated in the visualized upper abdomen. Musculoskeletal: Degenerative changes in the spine. Sternotomy wires. No acute displaced fractures. Review of the MIP images confirms the above findings. IMPRESSION: 1. No evidence of significant central pulmonary embolus. The previous left ventricular thrombus is resolved. 2. Diffuse bilateral ground-glass airspace infiltration throughout both lungs could represent multifocal COVID pneumonia or edema. Electronically Signed   By: Burman Nieves M.D.   On: 05/25/2020 04:57   DG Chest Portable 1 View  Result Date: 05/18/2020 CLINICAL DATA:  Shortness of breath, rule out COVID. EXAM: PORTABLE CHEST 1 VIEW COMPARISON:  Chest x-ray 09/27/2018. FINDINGS: Sternotomy wires are again noted. The heart size and mediastinal contours are within normal limits. Diffuse hazy airspace opacities that are more prominent in the peripheral left lung. No pulmonary edema. No pleural effusion. No pneumothorax. No acute osseous abnormality. IMPRESSION: Multifocal pneumonia likely representing known COVID-19 infection Electronically Signed   By: Tish Frederickson M.D.   On: 05/18/2020 20:17   VAS [place] LOWER EXTREMITY VENOUS (DVT)  Result Date: 05/24/2020  Lower Venous DVTStudy Indications: Elevated d-dimer.  Risk Factors: DVT Personal history. Anticoagulation: Coumadin. Limitations: Poor ultrasound/tissue interface. Comparison Study: 01-24-2020 Prior LT lower venous                   08-19-2018 Prior RT lower venous Performing Technologist: Jean Rosenthal  Examination Guidelines: A complete evaluation includes B-mode imaging, spectral Doppler, color Doppler, and power Doppler as needed of all accessible portions of each vessel. Bilateral testing is considered an integral part of a  complete examination. Limited examinations for reoccurring indications may be performed as noted. The reflux portion of the exam is performed with the patient in reverse Trendelenburg.  +---------+---------------+---------+-----------+----------+---------------+ RIGHT    CompressibilityPhasicitySpontaneityPropertiesThrombus Aging  +---------+---------------+---------+-----------+----------+---------------+ CFV      Full           Yes      Yes                                  +---------+---------------+---------+-----------+----------+---------------+ SFJ      Full                                                         +---------+---------------+---------+-----------+----------+---------------+ FV Prox  Full                                                         +---------+---------------+---------+-----------+----------+---------------+ FV Mid   Full                                                         +---------+---------------+---------+-----------+----------+---------------+  FV DistalFull                                                         +---------+---------------+---------+-----------+----------+---------------+ PFV      Full                                                         +---------+---------------+---------+-----------+----------+---------------+ POP      Full           Yes      Yes                                  +---------+---------------+---------+-----------+----------+---------------+ PTV      Full                                                         +---------+---------------+---------+-----------+----------+---------------+ PERO                                                  Patent by color +---------+---------------+---------+-----------+----------+---------------+ Gastroc  Full                                                          +---------+---------------+---------+-----------+----------+---------------+   +---------+---------------+---------+-----------+----------+-------------------+ LEFT     CompressibilityPhasicitySpontaneityPropertiesThrombus Aging      +---------+---------------+---------+-----------+----------+-------------------+ CFV      Full           Yes      Yes                                      +---------+---------------+---------+-----------+----------+-------------------+ SFJ      Full                                                             +---------+---------------+---------+-----------+----------+-------------------+ FV Prox  Full                                                             +---------+---------------+---------+-----------+----------+-------------------+ FV Mid   Full                                                             +---------+---------------+---------+-----------+----------+-------------------+  FV DistalFull                                                             +---------+---------------+---------+-----------+----------+-------------------+ PFV      Full                                                             +---------+---------------+---------+-----------+----------+-------------------+ POP      Full           Yes      Yes                                      +---------+---------------+---------+-----------+----------+-------------------+ PTV      Full                                                             +---------+---------------+---------+-----------+----------+-------------------+ PERO     Full                                         Some segments not                                                         well visualized     +---------+---------------+---------+-----------+----------+-------------------+ Gastroc  Full                                                              +---------+---------------+---------+-----------+----------+-------------------+     Summary: RIGHT: - There is no evidence of deep vein thrombosis in the lower extremity. However, portions of this examination were limited- see technologist comments above.  - No cystic structure found in the popliteal fossa.  LEFT: - There is no evidence of deep vein thrombosis in the lower extremity. However, portions of this examination were limited- see technologist comments above.  - No cystic structure found in the popliteal fossa.  *See table(s) above for measurements and observations. Electronically signed by Waverly Ferrari MD on 05/24/2020 at 3:16:29 PM.    Final

## 2020-05-28 NOTE — Progress Notes (Signed)
Inpatient Diabetes Program Recommendations  AACE/ADA: New Consensus Statement on Inpatient Glycemic Control   Target Ranges:  Prepandial:   less than 140 mg/dL      Peak postprandial:   less than 180 mg/dL (1-2 hours)      Critically ill patients:  140 - 180 mg/dL   Results for Marcus Joseph, Marcus Joseph (MRN [id number]) as of 05/28/2020 11:40  Ref. Range 05/27/2020 07:39 05/27/2020 12:08 05/27/2020 16:21 05/27/2020 23:12 05/28/2020 08:30  Glucose-Capillary Latest Ref Range: 70 - 99 mg/dL 517 (H) 616 (H) 073 (H) 160 (H) 91  Results for Marcus Joseph, Marcus Joseph (MRN [id number]) as of 05/28/2020 11:40  Ref. Range 05/19/2020 08:07  Hemoglobin A1C Latest Ref Range: 4.8 - 5.6 % 14.4 (H)   Review of Glycemic Control Outpatient DM medications: Lantus 30 units daily, Novolin R (Regular) TID with meals on sliding scale Current orders for Inpatient glycemic control:Lantus 85 units QAM, Lantus 50 units QHS, Novolog 12 units TID with meals, Novolog 0-20 units TID with meals, Novolog 0-5 units QHS, Tradjenta 5 mg daily; Solumedrol 60 mg Q12H  Inpatient Diabetes Program Recommendations:  Discharge DM medications: At time of discharge, please prescribe Novolin N (NPH) and Novolin R (Regular) with appropriate dosages as patient has no insurance and having to pay out of pocket for insulins (Lantus is too expensive).   NOTE: Inpatient diabetes coordinator spoke with patient yesterday over the phone regarding DM control and A1C. Patient noted that he was using Novolin R (Regular) from [facility] in place of Novolog and he noted that when he gets Lantus filled it is costing him between $300-400. Patient would prefer to use more affordable insulin for basal needs as well. Discussed Novolin N (NPH) which is $25 per vial at [facility]. At time of discharge, please prescribe Novolin N for basal insulin and Novolin R for meal coverage and/or correction.  Thanks, Orlando Penner, RN, MSN, CDE Diabetes Coordinator Inpatient Diabetes  Program [phone] (Team Pager from 8am to 5pm)

## 2020-05-28 NOTE — Progress Notes (Signed)
ANTICOAGULATION CONSULT NOTE  Pharmacy Consult for warfarin Indication: history DVT/PE  Labs: Recent Labs    05/26/20 0621 05/26/20 0621 05/27/20 0911 05/28/20 1126  HGB 14.2   < > 15.2 15.0  HCT 46.5  --  48.2 45.6  PLT 289  --  410* 471*  LABPROT 33.3*  --  27.3* 24.9*  INR 3.4*  --  2.6* 2.4*  CREATININE 0.81  --  0.92 1.02   < > = values in this interval not displayed.    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 Admission INR subtherapeutic at 1.0 (down from 3.4 checked 8 days prior). Patient reports "itching" allergy to Lovenox but has received heparin here in the past per Epic charting. Patient reports he has failed Xarelto in the past. Noted, patient is uninsured. 9/26 LE dopplers negative for DVT. 9/27 CTA negative for PE. D-dimer previously >20, now improving to 13. Warfarin has been on hold since 9/24 due to elevated INR.  Original plan was to await INR dropping below 2.5 and starting Arixtra given elevated d-dimer. Per discussion with Dr. Jerral Ralph 9/29, no need for Arixtra and just resume warfarin today with patient overall improving and oxygenation improving.  INR 2.4 today. We will repeat the 5mg  today.   PTA warfarin dose - 10mg  daily except 5mg  on MWF  Goal of Therapy:  INR 2-3 Monitor platelets by anticoagulation protocol: Yes   Plan:  Warfarin 5mg  PO x 1 at 1600 Monitor daily INR, CBC, s/sx bleeding   , PharmD, BCIDP, AAHIVP, CPP Infectious Disease Pharmacist 05/28/2020 2:28 PM

## 2020-05-28 NOTE — Progress Notes (Signed)
Physical Therapy Treatment Patient Details Name: Marcus Joseph. MRN: [id number] DOB: 1971-01-29 Today's Date: 05/28/2020    History of Present Illness Pt is 49 yo male with PMH of DM2, morbid obesity, L TKA, CABG, DVTs/PE, admitted 05/18/20 with SOB; workup for acute hypoxic respiratory failure seconadry to COVID-19 PNA. CTA and doppler negative for DVTs or PEs.   PT Comments    Pt progressing well with mobility. Pt able to increase bouts of ambulation distance, requiring seated rest breaks secondary to fatigue and SOB. SpO2 down to 86% on 4L O2 [place], but difficulty getting reliable pleth on pulse ox reading so unsure if accurate. Pt with improving awareness of self-monitoring activity tolerance; intermittent cues and education on energy conservation strategies. Pt motivated to participate and regain PLOF. Will continue to follow acutely.   Follow Up Recommendations  No PT follow up     Equipment Recommendations  None recommended by PT    Recommendations for Other Services       Precautions / Restrictions Precautions Precautions: Other (comment) Precaution Comments: watch O2 Restrictions Weight Bearing Restrictions: No    Mobility  Bed Mobility               General bed mobility comments: up in chair, returned to chair  Transfers Overall transfer level: Independent Equipment used: None Transfers: Sit to/from [employer] transfer comment: Multiple sit<>stands from recliner without difficulty  Ambulation/Gait Ambulation/Gait assistance: Supervision Gait Distance (Feet): 330 Feet Assistive device: None Gait Pattern/deviations: Step-through pattern;Decreased stride length;Wide base of support Gait velocity: Decreased   General Gait Details: Pt requesting to ambulate laps in room so that he does not have to don mask in hallway secondary to DOE. Pt ambulating multiple laps in room - 180' + 15' + 60' with seated rest breaks in between secondary to  SOB and fatigue. SpO2 down to 86% on 4L O2 [place], difficulty getting reliable pleth with pulse ox reading. Pt able to manage O2 tank himself   Stairs             Wheelchair Mobility    Modified Rankin (Stroke Patients Only)       Balance Overall balance assessment: Needs assistance Sitting-balance support: No upper extremity supported Sitting balance-Leahy Scale: Good     Standing balance support: No upper extremity supported Standing balance-Leahy Scale: Good                              Cognition Arousal/Alertness: Awake/alert Behavior During Therapy: WFL for tasks assessed/performed Overall Cognitive Status: Within Functional Limits for tasks assessed                                        Exercises Other Exercises Other Exercises: Incentive spirometer x10 (good technique) pulling 750-1248mL Other Exercises: Flutter valve x5 (good technique)    General Comments        Pertinent Vitals/Pain Pain Assessment: No/denies pain    Home Living                      Prior Function            PT Goals (current goals can now be found in the care plan section) Progress towards PT goals: Progressing toward goals    Frequency  Min 3X/week      PT Plan Current plan remains appropriate    Co-evaluation              AM-PAC PT "6 Clicks" Mobility   Outcome Measure  Help needed turning from your back to your side while in a flat bed without using bedrails?: None Help needed moving from lying on your back to sitting on the side of a flat bed without using bedrails?: None Help needed moving to and from a bed to a chair (including a wheelchair)?: None Help needed standing up from a chair using your arms (e.g., wheelchair or bedside chair)?: None Help needed to walk in hospital room?: None Help needed climbing 3-5 steps with a railing? : A Little 6 Click Score: 23    End of Session Equipment Utilized During  Treatment: Oxygen Activity Tolerance: Patient tolerated treatment well Patient left: in chair;with call bell/phone within reach Nurse Communication: Mobility status PT Visit Diagnosis: Other abnormalities of gait and mobility (R26.89)     Time: 6222-9798 PT Time Calculation (min) (ACUTE ONLY): 26 min  Charges:  $Therapeutic Exercise: 23-37 mins                    Ina Homes, PT, DPT Acute Rehabilitation Services  Pager [phone] Office [phone]  Malachy Chamber 05/28/2020, 10:56 AM

## 2020-05-29 LAB — GLUCOSE, CAPILLARY
Glucose-Capillary: 109 mg/dL — ABNORMAL HIGH (ref 70–99)
Glucose-Capillary: 266 mg/dL — ABNORMAL HIGH (ref 70–99)

## 2020-05-29 LAB — CBC
HCT: 47.4 % (ref 39.0–52.0)
Hemoglobin: 15.4 g/dL (ref 13.0–17.0)
MCH: 27.4 pg (ref 26.0–34.0)
MCHC: 32.5 g/dL (ref 30.0–36.0)
MCV: 84.2 fL (ref 80.0–100.0)
Platelets: 512 10*3/uL — ABNORMAL HIGH (ref 150–400)
RBC: 5.63 MIL/uL (ref 4.22–5.81)
RDW: 14.1 % (ref 11.5–15.5)
WBC: 17.9 10*3/uL — ABNORMAL HIGH (ref 4.0–10.5)
nRBC: 0 % (ref 0.0–0.2)

## 2020-05-29 LAB — COMPREHENSIVE METABOLIC PANEL
ALT: 29 U/L (ref 0–44)
AST: 21 U/L (ref 15–41)
Albumin: 2.5 g/dL — ABNORMAL LOW (ref 3.5–5.0)
Alkaline Phosphatase: 54 U/L (ref 38–126)
Anion gap: 13 (ref 5–15)
BUN: 20 mg/dL (ref 6–20)
CO2: 26 mmol/L (ref 22–32)
Calcium: 8.4 mg/dL — ABNORMAL LOW (ref 8.9–10.3)
Chloride: 100 mmol/L (ref 98–111)
Creatinine, Ser: 0.81 mg/dL (ref 0.61–1.24)
GFR calc Af Amer: >60 mL/min (ref >60)
GFR calc non Af Amer: >60 mL/min (ref >60)
Glucose, Bld: 110 mg/dL — ABNORMAL HIGH (ref 70–99)
Potassium: 4.2 mmol/L (ref 3.5–5.1)
Sodium: 139 mmol/L (ref 135–145)
Total Bilirubin: 0.6 mg/dL (ref 0.3–1.2)
Total Protein: 5.9 g/dL — ABNORMAL LOW (ref 6.5–8.1)

## 2020-05-29 LAB — PROTIME-INR
INR: 2.2 — ABNORMAL HIGH (ref 0.8–1.2)
Prothrombin Time: 24.1 seconds — ABNORMAL HIGH (ref 11.4–15.2)

## 2020-05-29 LAB — D-DIMER, QUANTITATIVE: D-Dimer, Quant: 5.52 ug/mL-FEU — ABNORMAL HIGH (ref 0.00–0.50)

## 2020-05-29 MED ORDER — WARFARIN SODIUM 7.5 MG PO TABS: 7.5000 mg | ORAL_TABLET | Freq: Once | ORAL | Status: DC

## 2020-05-29 MED ORDER — INSULIN GLARGINE 100 UNIT/ML SOLOSTAR PEN: 50.0000 [IU] | PEN_INJECTOR | Freq: Every day | SUBCUTANEOUS | 0 refills | Status: AC

## 2020-05-29 MED ORDER — PANTOPRAZOLE SODIUM 40 MG PO TBEC: 40.0000 mg | DELAYED_RELEASE_TABLET | Freq: Two times a day (BID) | ORAL | 0 refills | Status: DC

## 2020-05-29 MED ORDER — PREDNISONE 20 MG PO TABS: ORAL_TABLET | ORAL | 0 refills | Status: DC

## 2020-05-29 NOTE — TOC Transition Note (Signed)
Transition of Care [facility]) - CM/SW Discharge Note   Patient Details  Name: Marcus Joseph. MRN: [id number] Date of Birth: 10/02/1970  [facility] CM/SW Contact:  Lockie Pares, RN Phone Number: 05/29/2020, 3:12 PM   Clinical Narrative:    MATCH letter performed for patient assistance for medications faxed over to [facility] pharmacy [phone] Patient discharged     Barriers to Discharge: Continued Medical Work up   Patient Goals and CMS Choice        Discharge Placement                       Discharge Plan and Services   Discharge Planning Services: Follow-up appt scheduled                                 Social Determinants of Health (SDOH) Interventions     Readmission Risk Interventions No flowsheet data found.

## 2020-05-29 NOTE — Progress Notes (Signed)
Marcus Joseph. to be D/C'd Home per MD order.  Discussed with the patient and all questions fully answered.  VSS, Skin clean, dry and intact without evidence of skin break down, no evidence of skin tears noted. IV catheter discontinued intact. Site without signs and symptoms of complications. Dressing and pressure applied.  An After Visit Summary was printed and given to the patient.  D/c education completed with patient/family including follow up instructions, medication list, d/c activities limitations if indicated, with other d/c instructions as indicated by MD - patient able to verbalize understanding, all questions fully answered.   Patient instructed to return to ED, call 911, or call MD for any changes in condition.   Patient escorted via WC, and D/C home via private auto.  Velva Harman 05/29/2020 2:14 PM

## 2020-05-29 NOTE — Progress Notes (Signed)
ANTICOAGULATION CONSULT NOTE  Pharmacy Consult for warfarin Indication: history DVT/PE  Labs: Recent Labs    05/27/20 0911 05/27/20 0911 05/28/20 1126 05/29/20 0725  HGB 15.2   < > 15.0 15.4  HCT 48.2  --  45.6 47.4  PLT 410*  --  471* 512*  LABPROT 27.3*  --  24.9* 24.1*  INR 2.6*  --  2.4* 2.2*  CREATININE 0.92  --  1.02 0.81   < > = values in this interval not displayed.    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 Admission INR subtherapeutic at 1.0 (down from 3.4 checked 8 days prior). Patient reports "itching" allergy to Lovenox but has received heparin here in the past per Epic charting. Patient reports he has failed Xarelto in the past. Noted, patient is uninsured. 9/26 LE dopplers negative for DVT. 9/27 CTA negative for PE. D-dimer previously >20, now improving to 13. Warfarin has been on hold since 9/24 due to elevated INR.  Original plan was to await INR dropping below 2.5 and starting Arixtra given elevated d-dimer. Per discussion with Dr. Jerral Ralph 9/29, no need for Arixtra and just resume warfarin today with patient overall improving and oxygenation improving.  INR down slightly to 2.2. He may be discharged soon on coumadin.   PTA warfarin dose - 10mg  daily except 5mg  on MWF  Goal of Therapy:  INR 2-3 Monitor platelets by anticoagulation protocol: Yes   Plan:  Warfarin 7.5mg  PO x 1 at 1600 If he is dc today, consider 7.5mg  qday and check INR Monday Monitor daily INR, CBC, s/sx bleeding   , PharmD, BCIDP, AAHIVP, CPP Infectious Disease Pharmacist 05/29/2020 2:18 PM

## 2020-05-29 NOTE — Discharge Summary (Signed)
[facility]  Physician Discharge Summary   Patient ID: Marcus Joseph. MRN: [id number] DOB/AGE: 1971-02-11 49 y.o.  Admit date: 05/18/2020 Discharge date: 05/29/2020  PCP: [facility]  DISCHARGE DIAGNOSES:  Pneumonia due to COVID-19 Acute respiratory failure with hypoxia, resolved Essential hypertension Diabetes mellitus type 2, uncontrolled with hyperglycemia History of LV thrombus/recurrent VTE Morbid obesity  RECOMMENDATIONS FOR OUTPATIENT FOLLOW UP: 1. Outpatient follow-up with PCP 2. INR check early next week.    Home Health: None Equipment/Devices: None  CODE STATUS: Full code  DISCHARGE CONDITION: fair  Diet recommendation: Modified carbohydrate  INITIAL HISTORY: Patient is a 49 y.o. male with PMHx of recurrent venous thromboembolism, LV thrombus, on chronic anticoagulation with warfarin-morbidly obese, DM-2-presented with shortness of breath-found to have acute hypoxic respiratory failure requiring HFNC in the setting of COVID-19 pneumonia.   HOSPITAL COURSE:   Acute Hypoxic Resp Failure due to Covid 19 Viral pneumonia Had severe hypoxemia requiring heated high flow plus NRB on initial presentation.  Patient started improving.  He was weaned down from nonrebreather plus high flow to 5 L subsequently to 3 L subsequently to room air.  He completed course of Remdesivir.  He will be discharged on tapering doses of steroids.  Patient does also on baricitinib.  He was given Lasix to keep him in negative fluid balance.  Ambulatory pulse oximetry was done and he did not go below 89%.  He feels much better this morning.  Okay for discharge today.   Significantly elevated D-dimer: CTA chest/lower extremity Dopplers negative for VTE-chronically on Coumadin-INR has been supratherapeutic-but with improving hypoxemia-suspect that no further work-up is required.  Do not think we need to change to an alternate anticoagulant  agent.  Essential hypertension:  Patient to continue to hold his losartan and hydrochlorothiazide.  DM-2 (A1c 14.4 on 9/21) with uncontrolled hyperglycemia due to steroids: CBG should improve as steroid is tapered down.  He will be discharged on a slightly higher dose of his Lantus.    History of LV thrombus/recurrent VTE:  Continue warfarin  Morbid Obesity: Estimated body mass index is 43.34 kg/m as calculated from the following:   Height as of this encounter:  (1.778 m).   Weight as of this encounter: 137 kg.    Patient remains stable.  Okay for discharge today.    PERTINENT LABS:  The results of significant diagnostics from this hospitalization (including imaging, microbiology, ancillary and laboratory) are listed below for reference.      Labs:  COVID-19 Labs  Recent Labs    05/27/20 0911 05/28/20 1126 05/29/20 0725  DDIMER 13.09* 7.45* 5.52*    Lab Results  Component Value Date   SARSCOV2NAA POSITIVE (A) 05/18/2020      Basic Metabolic Panel: Recent Labs  Lab 05/25/20 0840 05/26/20 0621 05/27/20 0911 05/28/20 1126 05/29/20 0725  NA 136 135 135 134* 139  K 4.5 4.4 4.6 4.5 4.2  CL 107 103 98 98 100  CO2 20* 17* GLUCOSE 90 240* 237* 262* 110*  BUN 17 21* CREATININE 0.83 0.81 0.92 1.02 0.81  CALCIUM 8.0* 7.8* 8.2* 8.1* 8.4*   Liver Function Tests: Recent Labs  Lab 05/25/20 0840 05/26/20 0621 05/27/20 0911 05/28/20 1126 05/29/20 0725  AST ALT ALKPHOS 62 64 65 54 54  BILITOT 0.8 0.7 1.2 0.5 0.6  PROT 5.5* 5.2* 5.8* 5.5* 5.9*  ALBUMIN  2.1* 2.0* 2.3* 2.2* 2.5*   CBC: Recent Labs  Lab 05/23/20 0238 05/23/20 0238 05/24/20 0350 05/24/20 0350 05/25/20 0840 05/26/20 0621 05/27/20 0911 05/28/20 1126 05/29/20 0725  WBC 14.1*   < > 16.1*   < > 17.0* 11.7* 14.9* 16.2* 17.9*  NEUTROABS 12.0*  --  14.2*  --  15.4*  --   --   --   --   HGB 14.3   < > 13.9   < > 14.5 14.2 15.2  15.0 15.4  HCT 44.3   < > 43.6   < > 44.5 46.5 48.2 45.6 47.4  MCV 83.1   < > 83.4   < > 84.0 87.4 84.0 84.1 84.2  PLT 278   < > 270   < > 245 289 410* 471* 512*   < > = values in this interval not displayed.    CBG: Recent Labs  Lab 05/28/20 1305 05/28/20 1616 05/28/20 2207 05/29/20 0719 05/29/20 1126  GLUCAP 236* 256* 172* 109* 266*     IMAGING STUDIES CT ANGIO CHEST PE W OR WO CONTRAST  Result Date: 05/25/2020 CLINICAL DATA:  COVID-19 patient with history of pulmonary embolus and left ventricular thrombus. Elevated D-dimers. Suspected pulmonary embolus with high probability. EXAM: CT ANGIOGRAPHY CHEST WITH CONTRAST TECHNIQUE: Multidetector CT imaging of the chest was performed using the standard protocol during bolus administration of intravenous contrast. Multiplanar CT image reconstructions and MIPs were obtained to evaluate the vascular anatomy. CONTRAST:  OMNIPAQUE IOHEXOL 350 MG/ML SOLN COMPARISON:  08/18/2018 FINDINGS: Cardiovascular: Motion artifact limits examination. Moderately good opacification of the central and segmental pulmonary arteries. No filling defects identified suggesting no evidence of significant central pulmonary embolus. Peripheral pulmonary arteries may be obscured. Normal heart size. The previous left ventricular thrombus is resolved. No pericardial effusions. Pericardial calcification along the apex. Normal caliber thoracic aorta. 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 Mediastinum/Nodes: Surgical clips. Esophagus is decompressed. No significant lymphadenopathy. Thyroid gland is unremarkable. Lungs/Pleura: Diffuse bilateral ground-glass airspace infiltration throughout both lungs. This could represent multifocal COVID pneumonia or edema. No pleural effusions. No pneumothorax. Airways are patent. Upper Abdomen: No acute abnormalities demonstrated in the visualized upper abdomen. Musculoskeletal: Degenerative changes in the spine.  Sternotomy wires. No acute displaced fractures. Review of the MIP images confirms the above findings. IMPRESSION: 1. No evidence of significant central pulmonary embolus. The previous left ventricular thrombus is resolved. 2. Diffuse bilateral ground-glass airspace infiltration throughout both lungs could represent multifocal COVID pneumonia or edema. Electronically Signed   By: Burman Nieves M.D.   On: 05/25/2020 04:57   DG Chest Portable 1 View  Result Date: 05/18/2020 CLINICAL DATA:  Shortness of breath, rule out COVID. EXAM: PORTABLE CHEST 1 VIEW COMPARISON:  Chest x-ray 09/27/2018. FINDINGS: Sternotomy wires are again noted. The heart size and mediastinal contours are within normal limits. Diffuse hazy airspace opacities that are more prominent in the peripheral left lung. No pulmonary edema. No pleural effusion. No pneumothorax. No acute osseous abnormality. IMPRESSION: Multifocal pneumonia likely representing known COVID-19 infection Electronically Signed   By: Tish Frederickson M.D.   On: 05/18/2020 20:17   VAS [place] LOWER EXTREMITY VENOUS (DVT)  Result Date: 05/24/2020  Lower Venous DVTStudy Indications: Elevated d-dimer.  Risk Factors: DVT Personal history. Anticoagulation: Coumadin. Limitations: Poor ultrasound/tissue interface. Comparison Study: 01-24-2020 Prior LT lower venous                   08-19-2018 Prior RT lower venous Performing  Technologist: Jean Rosenthalachel Hodge  Examination Guidelines: A complete evaluation includes B-mode imaging, spectral Doppler, color Doppler, and power Doppler as needed of all accessible portions of each vessel. Bilateral testing is considered an integral part of a complete examination. Limited examinations for reoccurring indications may be performed as noted. The reflux portion of the exam is performed with the patient in reverse Trendelenburg.  +---------+---------------+---------+-----------+----------+---------------+ RIGHT     CompressibilityPhasicitySpontaneityPropertiesThrombus Aging  +---------+---------------+---------+-----------+----------+---------------+ CFV      Full           Yes      Yes                                  +---------+---------------+---------+-----------+----------+---------------+ SFJ      Full                                                         +---------+---------------+---------+-----------+----------+---------------+ FV Prox  Full                                                         +---------+---------------+---------+-----------+----------+---------------+ FV Mid   Full                                                         +---------+---------------+---------+-----------+----------+---------------+ FV DistalFull                                                         +---------+---------------+---------+-----------+----------+---------------+ PFV      Full                                                         +---------+---------------+---------+-----------+----------+---------------+ POP      Full           Yes      Yes                                  +---------+---------------+---------+-----------+----------+---------------+ PTV      Full                                                         +---------+---------------+---------+-----------+----------+---------------+ PERO  Patent by color +---------+---------------+---------+-----------+----------+---------------+ Gastroc  Full                                                         +---------+---------------+---------+-----------+----------+---------------+   +---------+---------------+---------+-----------+----------+-------------------+ LEFT     CompressibilityPhasicitySpontaneityPropertiesThrombus Aging      +---------+---------------+---------+-----------+----------+-------------------+ CFV      Full            Yes      Yes                                      +---------+---------------+---------+-----------+----------+-------------------+ SFJ      Full                                                             +---------+---------------+---------+-----------+----------+-------------------+ FV Prox  Full                                                             +---------+---------------+---------+-----------+----------+-------------------+ FV Mid   Full                                                             +---------+---------------+---------+-----------+----------+-------------------+ FV DistalFull                                                             +---------+---------------+---------+-----------+----------+-------------------+ PFV      Full                                                             +---------+---------------+---------+-----------+----------+-------------------+ POP      Full           Yes      Yes                                      +---------+---------------+---------+-----------+----------+-------------------+ PTV      Full                                                             +---------+---------------+---------+-----------+----------+-------------------+  PERO     Full                                         Some segments not                                                         well visualized     +---------+---------------+---------+-----------+----------+-------------------+ Gastroc  Full                                                             +---------+---------------+---------+-----------+----------+-------------------+     Summary: RIGHT: - There is no evidence of deep vein thrombosis in the lower extremity. However, portions of this examination were limited- see technologist comments above.  - No cystic structure found in the popliteal fossa.  LEFT: - There is no evidence of deep  vein thrombosis in the lower extremity. However, portions of this examination were limited- see technologist comments above.  - No cystic structure found in the popliteal fossa.  *See table(s) above for measurements and observations. Electronically signed by Waverly Ferrari MD on 05/24/2020 at 3:16:29 PM.    Final     DISCHARGE EXAMINATION: Vitals:   05/28/20 2223 05/29/20 0521 05/29/20 0543 05/29/20 0843  BP: 114/71 (!) 101/44 116/61   Pulse: 92 80 74   Resp: 18 (!) 27 17   Temp: 98.6 F (37 C) 98.1 F (36.7 C) 98.3 F (36.8 C)   TempSrc: Oral Oral Oral   SpO2: 92% 92% 98% 100%  Weight:      Height:       General appearance: Awake alert.  In no distress Resp: Improved aeration.  Few crackles in the bases. Cardio: S1-S2 is normal regular.  No S3-S4.  No rubs murmurs or bruit GI: Abdomen is soft.  Nontender nondistended.  Bowel sounds are present normal.  No masses organomegaly    DISPOSITION: Home  Discharge Instructions    Call MD for:  difficulty breathing, headache or visual disturbances   Complete by: As directed    Call MD for:  extreme fatigue   Complete by: As directed    Call MD for:  persistant dizziness or light-headedness   Complete by: As directed    Call MD for:  persistant nausea and vomiting   Complete by: As directed    Call MD for:  severe uncontrolled pain   Complete by: As directed    Call MD for:  temperature >100.4   Complete by: As directed    Diet - low sodium heart healthy   Complete by: As directed    Diet Carb Modified   Complete by: As directed    Discharge instructions   Complete by: As directed    Please take your medications as prescribed.  Follow-up with your outpatient providers.  Have your INR checked in the next 3 to 4 days.  You were cared for by a hospitalist during your hospital stay. If you have any questions about your discharge medications or the care you received  while you were in the hospital after you are discharged, you  can call the unit and asked to speak with the hospitalist on call if the hospitalist that took care of you is not available. Once you are discharged, your primary care physician will handle any further medical issues. Please note that NO REFILLS for any discharge medications will be authorized once you are discharged, as it is imperative that you return to your primary care physician (or establish a relationship with a primary care physician if you do not have one) for your aftercare needs so that they can reassess your need for medications and monitor your lab values. If you do not have a primary care physician, you can call [phone] for a physician referral.   Increase activity slowly   Complete by: As directed          Allergies as of 05/29/2020      Reactions   Contrast Media [iodinated Diagnostic Agents] Itching   Pt states he thinks it makes him itch   Lovenox [enoxaparin Sodium] Itching   Itching over entire body   Percocet [oxycodone-acetaminophen] Hives, Itching      Medication List    STOP taking these medications   hydrochlorothiazide 12.5 MG tablet Commonly known as: HYDRODIURIL   losartan 25 MG tablet Commonly known as: COZAAR   oseltamivir 75 MG capsule Commonly known as: TAMIFLU     TAKE these medications   aspirin EC 81 MG tablet Take 1 tablet (81 mg total) by mouth daily.   hydrALAZINE 25 MG tablet Commonly known as: APRESOLINE TAKE 1/2 TABLET BY MOUTH DAILY   insulin glargine 100 UNIT/ML Solostar Pen Commonly known as: LANTUS Inject 50 Units into the skin daily. What changed: how much to take   Insulin Pen Needle 32G X 4 MM Misc 1 Device by Does not apply route 4 (four) times daily. For use with insulin pens   insulin regular 100 units/mL injection Commonly known as: NOVOLIN R Inject into the skin 3 (three) times daily before meals. Novolin R TID with meals; dose based on sliding scale   metoprolol tartrate 25 MG tablet Commonly known as:  LOPRESSOR Take 1 tablet (25 mg total) by mouth 2 (two) times daily.   pantoprazole 40 MG tablet Commonly known as: PROTONIX Take 1 tablet (40 mg total) by mouth 2 (two) times daily before a meal for 10 days.   predniSONE 20 MG tablet Commonly known as: DELTASONE Take 3 tablets once daily for 3 days followed by 2 tablets once daily for 3 days followed by 1 tablet once daily for 3 days and then stop   tadalafil 5 MG tablet Commonly known as: CIALIS Take 5 mg by mouth daily as needed for erectile dysfunction.   traMADol 50 MG tablet Commonly known as: ULTRAM Take 50 mg by mouth every 8 (eight) hours as needed for moderate pain.   warfarin 10 MG tablet Commonly known as: COUMADIN Take as directed. If you are unsure how to take this medication, talk to your nurse or doctor. Original instructions: TAKE 1 TABLET BY MOUTH ONCE DAILY AS DIRECTED What changed:   how much to take  when to take this  additional instructions         Follow-up Information    [facility] Follow up on 06/08/2020.   Why: 1:15 pm Contact information: [address] [place] [address] [phone]  TOTAL DISCHARGE TIME: 35 minutes  Kariem Wolfson [employer] on [email or web address]  05/29/2020, 5:59 PM

## 2020-05-29 NOTE — Progress Notes (Signed)
Occupational Therapy Treatment Patient Details Name: Marcus Joseph. MRN: [id number] DOB: 23-Mar-1971 Today's Date: 05/29/2020    History of present illness Pt is 49 yo male with PMH of DM2, morbid obesity, L TKA, CABG, DVTs/PE, admitted 05/18/20 with SOB; workup for acute hypoxic respiratory failure seconadry to COVID-19 PNA. CTA and doppler negative for DVTs or PEs.   OT comments  Pt progressing towards established OT goals. Providing handout and education on energy conservation for ADLs and IADLs; pt verbalized understanding. Pt performing mobility to/from recliner and door three times then taking seated rest break; x2. Pt SpO2 dropping during first walk to 77% on RA. Cues for slower RR and purse lip breathing. During second walk, SpO2 dropping to 83% on RA.  Continue to recommend dc to home once medically stable per physician. Answered all questions in preparation for dc later today.    Follow Up Recommendations  No OT follow up;Supervision - Intermittent    Equipment Recommendations  None recommended by OT    Recommendations for Other Services      Precautions / Restrictions Precautions Precautions: Other (comment) Precaution Comments: watch O2       Mobility Bed Mobility               General bed mobility comments: up in chair, returned to chair  Transfers Overall transfer level: Independent                    Balance Overall balance assessment: Needs assistance Sitting-balance support: No upper extremity supported Sitting balance-Leahy Scale: Good     Standing balance support: No upper extremity supported Standing balance-Leahy Scale: Good                             ADL either performed or assessed with clinical judgement   ADL                                         General ADL Comments: Providing education and handout on energy conservation techniques for ADLs and IADLs. Pt verbalized understanding. Pt reports  he implements a lot of these strategies already.      Vision       Perception     Praxis      Cognition Arousal/Alertness: Awake/alert Behavior During Therapy: WFL for tasks assessed/performed Overall Cognitive Status: Within Functional Limits for tasks assessed                                          Exercises     Shoulder Instructions       General Comments Pt performing three laps to/from recliner and door; x2. SpO2 dropping to 77% on RA during first bout of mobility     Pertinent Vitals/ Pain       Pain Assessment: No/denies pain  Home Living                                          Prior Functioning/Environment              Frequency  Min 2X/week        Progress Toward Goals  OT Goals(current goals can now be found in the care plan section)  Progress towards OT goals: Progressing toward goals  Acute Rehab OT Goals Patient Stated Goal: return home OT Goal Formulation: With patient Time For Goal Achievement: 06/10/20 Potential to Achieve Goals: Good ADL Goals Pt Will Perform Tub/Shower Transfer: with modified independence;ambulating;shower seat Pt/caregiver will Perform Home Exercise Program: Increased strength;Both right and left upper extremity;With written HEP provided Additional ADL Goal #1: Pt will recall and apply 3-5 energy conservation skills to ADL routine Additional ADL Goal #2: Pt will self monitor and maintain SpO2 >88% during ADL routine  Plan Discharge plan remains appropriate    Co-evaluation                 AM-PAC OT "6 Clicks" Daily Activity     Outcome Measure   Help from another person eating meals?: None Help from another person taking care of personal grooming?: None Help from another person toileting, which includes using toliet, bedpan, or urinal?: A Little Help from another person bathing (including washing, rinsing, drying)?: A Little Help from another person to put on and  taking off regular upper body clothing?: A Little Help from another person to put on and taking off regular lower body clothing?: A Little 6 Click Score: 20    End of Session    OT Visit Diagnosis: Unsteadiness on feet (R26.81);Other abnormalities of gait and mobility (R26.89)   Activity Tolerance Patient tolerated treatment well   Patient Left in chair;with call bell/phone within reach   Nurse Communication Mobility status        Time: 9449-6759 OT Time Calculation (min): 29 min  Charges: OT General Charges $OT Visit: 1 Visit OT Treatments $Self Care/Home Management : 23-37 mins  Bahar Shelden MSOT, OTR/L Acute Rehab Pager: [phone] Office: [phone]   Marcus Joseph 05/29/2020, 12:58 PM

## 2020-05-29 NOTE — Discharge Instructions (Signed)
COVID-19 Frequently Asked Questions COVID-19 (coronavirus disease) is an infection that is caused by a large family of viruses. Some viruses cause illness in people and others cause illness in animals like camels, cats, and bats. In some cases, the viruses that cause illness in animals can spread to humans. Where did the coronavirus come from? In December 2019, [place] told the [facility]) of several cases of lung disease (human respiratory illness). These cases were linked to an open seafood and livestock market in the city of [place]. The link to the seafood and livestock market suggests that the virus may have spread from animals to humans. However, since that first outbreak in December, the virus has also been shown to spread from person to person. What is the name of the disease and the virus? Disease name Early on, this disease was called novel coronavirus. This is because scientists determined that the disease was caused by a new (novel) respiratory virus. The World Health Organization [facility]) has now named the disease COVID-19, or coronavirus disease. Virus name The virus that causes the disease is called severe acute respiratory syndrome coronavirus 2 (SARS-CoV-2). More information on disease and virus naming World Health Organization [facility]): www.who.int/emergencies/diseases/novel-coronavirus-2019/technical-guidance/naming-the-coronavirus-disease-(covid-2019)-and-the-virus-that-causes-it Who is at risk for complications from coronavirus disease? Some people may be at higher risk for complications from coronavirus disease. This includes older adults and people who have chronic diseases, such as heart disease, diabetes, and lung disease. If you are at higher risk for complications, take these extra precautions:  Stay home as much as possible.  Avoid social gatherings and travel.  Avoid close contact with others. Stay at least 6 ft (2 m) away from others, if possible.  Wash  your hands often with soap and water for at least 20 seconds.  Avoid touching your face, mouth, nose, or eyes.  Keep supplies on hand at home, such as food, medicine, and cleaning supplies.  If you must go out in public, wear a cloth face covering or face mask. Make sure your mask covers your nose and mouth. How does coronavirus disease spread? The virus that causes coronavirus disease spreads easily from person to person (is contagious). You may catch the virus by:  Breathing in droplets from an infected person. Droplets can be spread by a person breathing, speaking, singing, coughing, or sneezing.  Touching something, like a table or a doorknob, that was exposed to the virus (contaminated) and then touching your mouth, nose, or eyes. Can I get the virus from touching surfaces or objects? There is still a lot that we do not know about the virus that causes coronavirus disease. Scientists are basing a lot of information on what they know about similar viruses, such as:  Viruses cannot generally survive on surfaces for long. They need a human body (host) to survive.  It is more likely that the virus is spread by close contact with people who are sick (direct contact), such as through: ? Shaking hands or hugging. ? Breathing in respiratory droplets that travel through the air. Droplets can be spread by a person breathing, speaking, singing, coughing, or sneezing.  It is less likely that the virus is spread when a person touches a surface or object that has the virus on it (indirect contact). The virus may be able to enter the body if the person touches a surface or object and then touches his or her face, eyes, nose, or mouth. Can a person spread the virus without having symptoms of the disease?  It may be possible for the virus to spread before a person has symptoms of the disease, but this is most likely not the main way the virus is spreading. It is more likely for the virus to spread by  being in close contact with people who are sick and breathing in the respiratory droplets spread by a person breathing, speaking, singing, coughing, or sneezing. °What are the symptoms of coronavirus disease? °Symptoms vary from person to person and can range from mild to severe. Symptoms may include: °· Fever or chills. °· Cough. °· Difficulty breathing or feeling short of breath. °· Headaches, body aches, or muscle aches. °· Runny or stuffy (congested) nose. °· Sore throat. °· New loss of taste or smell. °· Nausea, vomiting, or diarrhea. °These symptoms can appear anywhere from 2 to 14 days after you have been exposed to the virus. Some people may not have any symptoms. If you develop symptoms, call your health care provider. People with severe symptoms may need hospital care. °Should I be tested for this virus? °Your health care provider will decide whether to test you based on your symptoms, history of exposure, and your risk factors. °How does a health care provider test for this virus? °Health care providers will collect samples to send for testing. Samples may include: °· Taking a swab of fluid from the back of your nose and throat, your nose, or your throat. °· Taking fluid from the lungs by having you cough up mucus (sputum) into a sterile cup. °· Taking a blood sample. °Is there a treatment or vaccine for this virus? °Currently, there is no vaccine to prevent coronavirus disease. Also, there are no medicines like antibiotics or antivirals to treat the virus. A person who becomes sick is given supportive care, which means rest and fluids. A person may also relieve his or her symptoms by using over-the-counter medicines that treat sneezing, coughing, and runny nose. These are the same medicines that a person takes for the common cold. °If you develop symptoms, call your health care provider. People with severe symptoms may need hospital care. °What can I do to protect myself and my family from this  virus? ° °  ° °You can protect yourself and your family by taking the same actions that you would take to prevent the spread of other viruses. Take the following actions: °· Wash your hands often with soap and water for at least 20 seconds. If soap and water are not available, use alcohol-based hand sanitizer. °· Avoid touching your face, mouth, nose, or eyes. °· Cough or sneeze into a tissue, sleeve, or elbow. Do not cough or sneeze into your hand or the air. °? If you cough or sneeze into a tissue, throw it away immediately and wash your hands. °· Disinfect objects and surfaces that you frequently touch every day. °· Stay away from people who are sick. °· Avoid going out in public, follow guidance from your state and local health authorities. °· Avoid crowded indoor spaces. Stay at least 6 ft (2 m) away from others. °· If you must go out in public, wear a cloth face covering or face mask. Make sure your mask covers your nose and mouth. °· Stay home if you are sick, except to get medical care. Call your health care provider before you get medical care. Your health care provider will tell you how long to stay home. °· Make sure your vaccines are up to date. Ask your health care provider what   vaccines you need. °What should I do if I need to travel? °Follow travel recommendations from your local health authority, the CDC, and WHO. °Travel information and advice °· Centers for Disease Control and Prevention (CDC): www.cdc.gov/coronavirus/2019-ncov/travelers/index.html °· World Health Organization (WHO): www.who.int/emergencies/diseases/novel-coronavirus-2019/travel-advice °Know the risks and take action to protect your health °· You are at higher risk of getting coronavirus disease if you are traveling to areas with an outbreak or if you are exposed to travelers from areas with an outbreak. °· Wash your hands often and practice good hygiene to lower the risk of catching or spreading the virus. °What should I do if I  am sick? °General instructions to stop the spread of infection °· Wash your hands often with soap and water for at least 20 seconds. If soap and water are not available, use alcohol-based hand sanitizer. °· Cough or sneeze into a tissue, sleeve, or elbow. Do not cough or sneeze into your hand or the air. °· If you cough or sneeze into a tissue, throw it away immediately and wash your hands. °· Stay home unless you must get medical care. Call your health care provider or local health authority before you get medical care. °· Avoid public areas. Do not take public transportation, if possible. °· If you can, wear a mask if you must go out of the house or if you are in close contact with someone who is not sick. Make sure your mask covers your nose and mouth. °Keep your home clean °· Disinfect objects and surfaces that are frequently touched every day. This may include: °? Counters and tables. °? Doorknobs and light switches. °? Sinks and faucets. °? Electronics such as phones, remote controls, keyboards, computers, and tablets. °· Wash dishes in hot, soapy water or use a dishwasher. Air-dry your dishes. °· Wash laundry in hot water. °Prevent infecting other household members °· Let healthy household members care for children and pets, if possible. If you have to care for children or pets, wash your hands often and wear a mask. °· Sleep in a different bedroom or bed, if possible. °· Do not share personal items, such as razors, toothbrushes, deodorant, combs, brushes, towels, and washcloths. °Where to find more information °Centers for Disease Control and Prevention (CDC) °· Information and news updates: www.cdc.gov/coronavirus/2019-ncov °World Health Organization (WHO) °· Information and news updates: www.who.int/emergencies/diseases/novel-coronavirus-2019 °· Coronavirus health topic: www.who.int/health-topics/coronavirus °· Questions and answers on COVID-19: www.who.int/news-room/q-a-detail/q-a-coronaviruses °· Global  tracker: who.sprinklr.com °American Academy of Pediatrics (AAP) °· Information for families: www.healthychildren.org/English/health-issues/conditions/chest-lungs/Pages/2019-Novel-Coronavirus.aspx °The coronavirus situation is changing rapidly. Check your local health authority website or the CDC and WHO websites for updates and news. °When should I contact a health care provider? °· Contact your health care provider if you have symptoms of an infection, such as fever or cough, and you: °? Have been near anyone who is known to have coronavirus disease. °? Have come into contact with a person who is suspected to have coronavirus disease. °? Have traveled to an area where there is an outbreak of COVID-19. °When should I get emergency medical care? °· Get help right away by calling your local emergency services (911 in the [place] if you have: °? Trouble breathing. °? Pain or pressure in your chest. °? Confusion. °? Blue-tinged lips and fingernails. °? Difficulty waking from sleep. °? Symptoms that get worse. °Let the emergency medical personnel know if you think you have coronavirus disease. °Summary °· A new respiratory virus is spreading from person to person and causing   COVID-19 (coronavirus disease).  The virus that causes COVID-19 appears to spread easily. It spreads from one person to another through droplets from breathing, speaking, singing, coughing, or sneezing.  Older adults and those with chronic diseases are at higher risk of disease. If you are at higher risk for complications, take extra precautions.  There is currently no vaccine to prevent coronavirus disease. There are no medicines, such as antibiotics or antivirals, to treat the virus.  You can protect yourself and your family by washing your hands often, avoiding touching your face, and covering your coughs and sneezes. This information is not intended to replace advice given to you by your health care provider. Make sure you discuss any  questions you have with your health care provider. Document Revised: 06/14/2019 Document Reviewed: 12/11/2018 Elsevier Patient Education  2020 [employer].  Information on my medicine - Coumadin   (Warfarin)  This medication education was reviewed with me or my healthcare representative as part of my discharge preparation.  The pharmacist that spoke with me during my hospital stay was:  Ulyses Southward, RPH-CPP  Why was Coumadin prescribed for you? Coumadin was prescribed for you because you have a blood clot or a medical condition that can cause an increased risk of forming blood clots. Blood clots can cause serious health problems by blocking the flow of blood to the heart, lung, or brain. Coumadin can prevent harmful blood clots from forming. As a reminder your indication for Coumadin is:   Pulmonary Embolism Treatment  What test will check on my response to Coumadin? While on Coumadin (warfarin) you will need to have an INR test regularly to ensure that your dose is keeping you in the desired range. The INR (international normalized ratio) number is calculated from the result of the laboratory test called prothrombin time (PT).  If an INR APPOINTMENT HAS NOT ALREADY BEEN MADE FOR YOU please schedule an appointment to have this lab work done by your health care provider within 7 days. Your INR goal is usually a number between:  2 to 3 or your provider may give you a more narrow range like 2-2.5.  Ask your health care provider during an office visit what your goal INR is.  What  do you need to  know  About  COUMADIN? Take Coumadin (warfarin) exactly as prescribed by your healthcare provider about the same time each day.  DO NOT stop taking without talking to the doctor who prescribed the medication.  Stopping without other blood clot prevention medication to take the place of Coumadin may increase your risk of developing a new clot or stroke.  Get refills before you run out.  What do you do if  you miss a dose? If you miss a dose, take it as soon as you remember on the same day then continue your regularly scheduled regimen the next day.  Do not take two doses of Coumadin at the same time.  Important Safety Information A possible side effect of Coumadin (Warfarin) is an increased risk of bleeding. You should call your healthcare provider right away if you experience any of the following: ? Bleeding from an injury or your nose that does not stop. ? Unusual colored urine (red or dark brown) or unusual colored stools (red or black). ? Unusual bruising for unknown reasons. ? A serious fall or if you hit your head (even if there is no bleeding).  Some foods or medicines interact with Coumadin (warfarin) and might alter your response  to warfarin. To help avoid this: ? Eat a balanced diet, maintaining a consistent amount of Vitamin K. ? Notify your provider about major diet changes you plan to make. ? Avoid alcohol or limit your intake to 1 drink for women and 2 drinks for men per day. (1 drink is 5 oz. wine, 12 oz. beer, or 1.5 oz. liquor.)  Make sure that ANY health care provider who prescribes medication for you knows that you are taking Coumadin (warfarin).  Also make sure the healthcare provider who is monitoring your Coumadin knows when you have started a new medication including herbals and non-prescription products.  Coumadin (Warfarin)  Major Drug Interactions  Increased Warfarin Effect Decreased Warfarin Effect  Alcohol (large quantities) Antibiotics (esp. Septra/Bactrim, Flagyl, Cipro) Amiodarone (Cordarone) Aspirin (ASA) Cimetidine (Tagamet) Megestrol (Megace) NSAIDs (ibuprofen, naproxen, etc.) Piroxicam (Feldene) Propafenone (Rythmol SR) Propranolol (Inderal) Isoniazid (INH) Posaconazole (Noxafil) Barbiturates (Phenobarbital) Carbamazepine (Tegretol) Chlordiazepoxide (Librium) Cholestyramine (Questran) Griseofulvin Oral Contraceptives Rifampin Sucralfate  (Carafate) Vitamin K   Coumadin (Warfarin) Major Herbal Interactions  Increased Warfarin Effect Decreased Warfarin Effect  Garlic Ginseng Ginkgo biloba Coenzyme Q10 Green tea St. Johns wort    Coumadin (Warfarin) FOOD Interactions  Eat a consistent number of servings per week of foods HIGH in Vitamin K (1 serving =  cup)  Collards (cooked, or boiled & drained) Kale (cooked, or boiled & drained) Mustard greens (cooked, or boiled & drained) Parsley *serving size only =  cup Spinach (cooked, or boiled & drained) Swiss chard (cooked, or boiled & drained) Turnip greens (cooked, or boiled & drained)  Eat a consistent number of servings per week of foods MEDIUM-HIGH in Vitamin K (1 serving = 1 cup)  Asparagus (cooked, or boiled & drained) Broccoli (cooked, boiled & drained, or raw & chopped) Brussel sprouts (cooked, or boiled & drained) *serving size only =  cup Lettuce, raw (green leaf, endive, romaine) Spinach, raw Turnip greens, raw & chopped   These websites have more information on Coumadin (warfarin):  http://www.king-russell.com/; https://www.hines.net/;

## 2020-06-08 ENCOUNTER — Ambulatory Visit (INDEPENDENT_AMBULATORY_CARE_PROVIDER_SITE_OTHER): Payer: Self-pay

## 2020-06-08 ENCOUNTER — Other Ambulatory Visit: Payer: Self-pay

## 2020-06-08 ENCOUNTER — Inpatient Hospital Stay: Payer: Self-pay

## 2020-06-08 DIAGNOSIS — Z7901 Long term (current) use of anticoagulants: Secondary | ICD-10-CM

## 2020-06-08 DIAGNOSIS — I240 Acute coronary thrombosis not resulting in myocardial infarction: Secondary | ICD-10-CM

## 2020-06-08 DIAGNOSIS — Z5181 Encounter for therapeutic drug level monitoring: Secondary | ICD-10-CM

## 2020-06-08 DIAGNOSIS — I2699 Other pulmonary embolism without acute cor pulmonale: Secondary | ICD-10-CM

## 2020-06-08 DIAGNOSIS — D6859 Other primary thrombophilia: Secondary | ICD-10-CM

## 2020-06-08 LAB — POCT INR: INR: 4 — AB (ref 2.0–3.0)

## 2020-06-08 NOTE — Patient Instructions (Signed)
Hold today and tomorrow and then continue with 1 tablet daily except 1/2 tablet on Mondays, Wednesdays and Fridays. Recheck in 2 weeks.  Call us with any medication changes #[phone] or concerns.

## 2020-06-10 ENCOUNTER — Ambulatory Visit (INDEPENDENT_AMBULATORY_CARE_PROVIDER_SITE_OTHER): Payer: Self-pay | Admitting: Nurse Practitioner

## 2020-06-10 VITALS — BP 126/84 | HR 90 | Temp 97.7°F | Wt 289.0 lb

## 2020-06-10 DIAGNOSIS — I2699 Other pulmonary embolism without acute cor pulmonale: Secondary | ICD-10-CM

## 2020-06-10 DIAGNOSIS — R0902 Hypoxemia: Secondary | ICD-10-CM

## 2020-06-10 DIAGNOSIS — I240 Acute coronary thrombosis not resulting in myocardial infarction: Secondary | ICD-10-CM

## 2020-06-10 DIAGNOSIS — J1282 Pneumonia due to coronavirus disease 2019: Secondary | ICD-10-CM

## 2020-06-10 DIAGNOSIS — U071 COVID-19: Secondary | ICD-10-CM

## 2020-06-10 NOTE — Progress Notes (Signed)
@Patient  ID: ., male    DOB: 1971/03/04, 49 y.o.   MRN: [id number]  Chief Complaint  Patient presents with  . Hospitalization Follow-up    COVID Pos: 9/20 recieved infusion in hosp. 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  Patient has already followed up with primary care physician.  Patient was hospitalized for acute hypoxic respiratory failure due to Covid pneumonia.  Patient does have a history of LV thrombus/recurrent VTE and has been on Coumadin for this.  He has followed up with Coumadin clinic since hospital discharge.  Patient was treated for Covid pneumonia with oxygen, remdesivir, and IV steroids.  Patient was discharged home without oxygen.  It was noted at his follow-up visit with his primary care that he was desatting and PCP ordered home O2.  Patient does have a pulse oximeter and checks his oxygen level frequently.  He states that it is still around 90% on 2 L with ambulation.  Patient is try to stay active.  He has been compliant with deep breathing exercises.  He is compliant with medications.  He does have family support at home to help him with activities of daily living.  Patient's primary care physician did order home health nursing to come out of the house.  Patient states overall he is improving slowly. Denies f/c/s, n/v/d, hemoptysis, PND, chest pain or edema.        Allergies  Allergen Reactions  . Contrast Media [Iodinated Diagnostic Agents] Itching    Pt states he thinks it makes him itch  . Lovenox [Enoxaparin Sodium] Itching    Itching over entire body  . Percocet  [Oxycodone-Acetaminophen] Hives and Itching    Immunization History  Administered Date(s) Administered  . Td 07/31/2017    Past Medical History:  Diagnosis Date  . Anemia age 53 or 7   none since  . Arthritis    oa  . Blood dyscrasia   . Diabetes mellitus without complication (HCC)    newly diagnosed  . DVT (deep venous thrombosis) (HCC)   . GERD (gastroesophageal reflux disease)   . Head problem    back of top of head area swells at times, had glass in wound at times, pt instrcuted to follow up with md, if any pus seen  . Headache(784.0)    migraine  . Morbid obesity (HCC)   . PE (pulmonary embolism) 10/01/2014  . Prediabetes on 04-28-14  . Sciatica     Tobacco History: Social History   Tobacco Use  Smoking Status Never Smoker  Smokeless Tobacco Never Used   Counseling given: Not Answered   Outpatient Encounter Medications as of 06/10/2020  Medication Sig  . aspirin EC 81 MG tablet Take 1 tablet (81 mg total) by mouth daily.  . insulin glargine (LANTUS) 100 UNIT/ML Solostar Pen Inject 50 Units into the skin daily.  . Insulin Pen Needle 32G X 4 MM MISC 1 Device by Does not apply route 4 (four) times daily. For use with insulin pens  . insulin regular (NOVOLIN R) 100 units/mL injection Inject into the skin 3 (three) times daily before meals.  Novolin R TID with meals; dose based on sliding scale  . metoprolol tartrate (LOPRESSOR) 25 MG tablet Take 1 tablet (25 mg total) by mouth 2 (two) times daily.  . traMADol (ULTRAM) 50 MG tablet Take 50 mg by mouth every 8 (eight) hours as needed for moderate pain.   Marland Kitchen warfarin (COUMADIN) 10 MG tablet TAKE 1 TABLET BY MOUTH ONCE DAILY AS DIRECTED (Patient taking differently: Take 5-10 mg by mouth as directed. Taking 10mg  on Sat, Sun, Mon, Thursday, then 5mg  (1/2) tab on Tues, Wed, Friday)  . albuterol (ACCUNEB) 1.25 MG/3ML nebulizer solution Take by nebulization every 6 (six) hours as needed.  . hydrALAZINE (APRESOLINE) 25 MG tablet  TAKE 1/2 TABLET BY MOUTH DAILY (Patient taking differently: Take 12.5 mg by mouth daily. )  . pantoprazole (PROTONIX) 40 MG tablet Take 1 tablet (40 mg total) by mouth 2 (two) times daily before a meal for 10 days.  . tadalafil (CIALIS) 5 MG tablet Take 5 mg by mouth daily as needed for erectile dysfunction.  . [DISCONTINUED] predniSONE (DELTASONE) 20 MG tablet Take 3 tablets once daily for 3 days followed by 2 tablets once daily for 3 days followed by 1 tablet once daily for 3 days and then stop   No facility-administered encounter medications on file as of 06/10/2020.     Review of Systems  Review of Systems  Constitutional: Positive for fatigue.  HENT: Negative.   Respiratory: Positive for cough and shortness of breath.   Cardiovascular: Negative for chest pain, palpitations and leg swelling.  Gastrointestinal: Negative.   Allergic/Immunologic: Negative.   Neurological: Negative.   Psychiatric/Behavioral: Negative.        Physical Exam  BP 126/84 (BP Location: Right Arm)   Pulse 90   Temp 97.7 F (36.5 C)   Wt 289 lb (131.1 kg)   SpO2 97%   BMI 41.47 kg/m   Wt Readings from Last 5 Encounters:  06/10/20 289 lb (131.1 kg)  05/25/20 (!) 302 lb 0.5 oz (137 kg)  01/23/20 (!) 312 lb (141.5 kg)  10/30/19 (!) 315 lb (142.9 kg)  03/28/19 (!) 312 lb (141.5 kg)     Physical Exam Vitals and nursing note reviewed.  Constitutional:      General: He is not in acute distress.    Appearance: He is well-developed.  Cardiovascular:     Rate and Rhythm: Normal rate and regular rhythm.  Pulmonary:     Effort: Pulmonary effort is normal.     Comments: Breath sounds diminished throughout.  Skin:    General: Skin is warm and dry.  Neurological:     Mental Status: He is alert and oriented to person, place, and time.  Psychiatric:        Mood and Affect: Mood normal.        Behavior: Behavior normal.       Imaging: CT ANGIO CHEST PE W OR WO CONTRAST  Result Date:  05/25/2020 CLINICAL DATA:  COVID-19 patient with history of pulmonary embolus and left ventricular thrombus. Elevated D-dimers. Suspected pulmonary embolus with high probability. EXAM: CT ANGIOGRAPHY CHEST WITH CONTRAST TECHNIQUE: Multidetector CT imaging of the chest was performed using the standard protocol during bolus administration of intravenous contrast. Multiplanar CT image reconstructions and MIPs were obtained to evaluate the vascular anatomy. CONTRAST:  03/30/19 OMNIPAQUE IOHEXOL 350 MG/ML SOLN COMPARISON:  08/18/2018 FINDINGS: Cardiovascular: Motion artifact limits examination. Moderately good opacification of the central and segmental pulmonary arteries. No filling defects identified suggesting no evidence  of significant central pulmonary embolus. Peripheral pulmonary arteries may be obscured. Normal heart size. The previous left ventricular thrombus is resolved. No pericardial effusions. Pericardial calcification along the apex. Normal caliber thoracic aorta. 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 Mediastinum/Nodes: Surgical clips. Esophagus is decompressed. No significant lymphadenopathy. Thyroid gland is unremarkable. Lungs/Pleura: Diffuse bilateral ground-glass airspace infiltration throughout both lungs. This could represent multifocal COVID pneumonia or edema. No pleural effusions. No pneumothorax. Airways are patent. Upper Abdomen: No acute abnormalities demonstrated in the visualized upper abdomen. Musculoskeletal: Degenerative changes in the spine. Sternotomy wires. No acute displaced fractures. Review of the MIP images confirms the above findings. IMPRESSION: 1. No evidence of significant central pulmonary embolus. The previous left ventricular thrombus is resolved. 2. Diffuse bilateral ground-glass airspace infiltration throughout both lungs could represent multifocal COVID pneumonia or edema. Electronically Signed   By: Burman Nieves M.D.   On: 05/25/2020 04:57   DG  Chest Portable 1 View  Result Date: 05/18/2020 CLINICAL DATA:  Shortness of breath, rule out COVID. EXAM: PORTABLE CHEST 1 VIEW COMPARISON:  Chest x-ray 09/27/2018. FINDINGS: Sternotomy wires are again noted. The heart size and mediastinal contours are within normal limits. Diffuse hazy airspace opacities that are more prominent in the peripheral left lung. No pulmonary edema. No pleural effusion. No pneumothorax. No acute osseous abnormality. IMPRESSION: Multifocal pneumonia likely representing known COVID-19 infection Electronically Signed   By: Tish Frederickson M.D.   On: 05/18/2020 20:17   VAS [place] LOWER EXTREMITY VENOUS (DVT)  Result Date: 05/24/2020  Lower Venous DVTStudy Indications: Elevated d-dimer.  Risk Factors: DVT Personal history. Anticoagulation: Coumadin. Limitations: Poor ultrasound/tissue interface. Comparison Study: 01-24-2020 Prior LT lower venous                   08-19-2018 Prior RT lower venous Performing Technologist: Jean Rosenthal  Examination Guidelines: A complete evaluation includes B-mode imaging, spectral Doppler, color Doppler, and power Doppler as needed of all accessible portions of each vessel. Bilateral testing is considered an integral part of a complete examination. Limited examinations for reoccurring indications may be performed as noted. The reflux portion of the exam is performed with the patient in reverse Trendelenburg.  +---------+---------------+---------+-----------+----------+---------------+ RIGHT    CompressibilityPhasicitySpontaneityPropertiesThrombus Aging  +---------+---------------+---------+-----------+----------+---------------+ CFV      Full           Yes      Yes                                  +---------+---------------+---------+-----------+----------+---------------+ SFJ      Full                                                         +---------+---------------+---------+-----------+----------+---------------+ FV Prox  Full                                                          +---------+---------------+---------+-----------+----------+---------------+ FV Mid   Full                                                         +---------+---------------+---------+-----------+----------+---------------+  FV DistalFull                                                         +---------+---------------+---------+-----------+----------+---------------+ PFV      Full                                                         +---------+---------------+---------+-----------+----------+---------------+ POP      Full           Yes      Yes                                  +---------+---------------+---------+-----------+----------+---------------+ PTV      Full                                                         +---------+---------------+---------+-----------+----------+---------------+ PERO                                                  Patent by color +---------+---------------+---------+-----------+----------+---------------+ Gastroc  Full                                                         +---------+---------------+---------+-----------+----------+---------------+   +---------+---------------+---------+-----------+----------+-------------------+ LEFT     CompressibilityPhasicitySpontaneityPropertiesThrombus Aging      +---------+---------------+---------+-----------+----------+-------------------+ CFV      Full           Yes      Yes                                      +---------+---------------+---------+-----------+----------+-------------------+ SFJ      Full                                                             +---------+---------------+---------+-----------+----------+-------------------+ FV Prox  Full                                                             +---------+---------------+---------+-----------+----------+-------------------+ FV Mid    Full                                                             +---------+---------------+---------+-----------+----------+-------------------+  FV DistalFull                                                             +---------+---------------+---------+-----------+----------+-------------------+ PFV      Full                                                             +---------+---------------+---------+-----------+----------+-------------------+ POP      Full           Yes      Yes                                      +---------+---------------+---------+-----------+----------+-------------------+ PTV      Full                                                             +---------+---------------+---------+-----------+----------+-------------------+ PERO     Full                                         Some segments not                                                         well visualized     +---------+---------------+---------+-----------+----------+-------------------+ Gastroc  Full                                                             +---------+---------------+---------+-----------+----------+-------------------+     Summary: RIGHT: - There is no evidence of deep vein thrombosis in the lower extremity. However, portions of this examination were limited- see technologist comments above.  - No cystic structure found in the popliteal fossa.  LEFT: - There is no evidence of deep vein thrombosis in the lower extremity. However, portions of this examination were limited- see technologist comments above.  - No cystic structure found in the popliteal fossa.  *See table(s) above for measurements and observations. Electronically signed by Waverly Ferrari MD on 05/24/2020 at 3:16:29 PM.    Final      Assessment & Plan:   Pneumonia due to COVID-19 virus Respiratory failure:  PCP has ordered Home Health  Stay well hydrated  Stay  active  Deep breathing exercises  May take mucinex  twice daily  Continue O2 - please keep close check on O2 sats and may wean O2 to keep sats above 90%  Will  place referral to pulmonary - will need follow up imaging   History of LV thrombus/recurrent VTE:  Please continue warfarin per coumadin clinic     Follow up:  Follow up as needed      Ivonne Andrew, NP 06/11/2020

## 2020-06-10 NOTE — Patient Instructions (Addendum)
Covid Pneumonia Respiratory failure:  PCP has ordered Home Health  Stay well hydrated  Stay active  Deep breathing exercises  May take mucinex  twice daily  Continue O2 - please keep close check on O2 sats and may wean O2 to keep sats above 90%  Will place referral to pulmonary - will need follow up imaging   History of LV thrombus/recurrent VTE:  Please continue warfarin per coumadin clinic     Follow up:  Follow up as needed

## 2020-06-11 NOTE — Assessment & Plan Note (Signed)
Respiratory failure:  PCP has ordered Home Health  Stay well hydrated  Stay active  Deep breathing exercises  May take mucinex  twice daily  Continue O2 - please keep close check on O2 sats and may wean O2 to keep sats above 90%  Will place referral to pulmonary - will need follow up imaging   History of LV thrombus/recurrent VTE:  Please continue warfarin per coumadin clinic     Follow up:  Follow up as needed

## 2020-06-18 MED FILL — WARFARIN NA 10 MG TAB: 10 | 30 days supply | Qty: 30 | Fill #1 | Status: TO

## 2020-06-22 ENCOUNTER — Ambulatory Visit (INDEPENDENT_AMBULATORY_CARE_PROVIDER_SITE_OTHER): Payer: Self-pay | Admitting: Pharmacist

## 2020-06-22 ENCOUNTER — Other Ambulatory Visit: Payer: Self-pay

## 2020-06-22 DIAGNOSIS — I240 Acute coronary thrombosis not resulting in myocardial infarction: Secondary | ICD-10-CM

## 2020-06-22 DIAGNOSIS — D6859 Other primary thrombophilia: Secondary | ICD-10-CM

## 2020-06-22 DIAGNOSIS — Z7901 Long term (current) use of anticoagulants: Secondary | ICD-10-CM

## 2020-06-22 DIAGNOSIS — I2699 Other pulmonary embolism without acute cor pulmonale: Secondary | ICD-10-CM

## 2020-06-22 LAB — POCT INR: INR: 2.8 (ref 2.0–3.0)

## 2020-07-20 ENCOUNTER — Other Ambulatory Visit: Payer: Self-pay

## 2020-07-20 ENCOUNTER — Ambulatory Visit (INDEPENDENT_AMBULATORY_CARE_PROVIDER_SITE_OTHER): Payer: Self-pay

## 2020-07-20 DIAGNOSIS — D6859 Other primary thrombophilia: Secondary | ICD-10-CM

## 2020-07-20 DIAGNOSIS — I240 Acute coronary thrombosis not resulting in myocardial infarction: Secondary | ICD-10-CM

## 2020-07-20 DIAGNOSIS — Z7901 Long term (current) use of anticoagulants: Secondary | ICD-10-CM

## 2020-07-20 DIAGNOSIS — I2699 Other pulmonary embolism without acute cor pulmonale: Secondary | ICD-10-CM

## 2020-07-20 DIAGNOSIS — Z5181 Encounter for therapeutic drug level monitoring: Secondary | ICD-10-CM

## 2020-07-20 LAB — POCT INR: INR: 1.5 — AB (ref 2.0–3.0)

## 2020-07-20 NOTE — Patient Instructions (Signed)
Take 1.5 tablets tonight and then Continue with 1 tablet daily except 1/2 tablet on Mondays, Wednesdays and Fridays. Recheck in 4 weeks.  Call us with any medication changes #[phone] or concerns

## 2020-08-04 ENCOUNTER — Other Ambulatory Visit: Payer: Self-pay | Admitting: Internal Medicine

## 2020-08-04 MED ORDER — WARFARIN SODIUM 10 MG PO TABS
5.0000 mg | ORAL_TABLET | ORAL | 1 refills | Status: DC
Start: 2020-08-04 — End: 2020-08-04

## 2020-08-04 MED FILL — WARFARIN NA 10 MG TAB: 10 | 43 days supply | Qty: 60 | Fill #0

## 2020-08-17 ENCOUNTER — Ambulatory Visit (INDEPENDENT_AMBULATORY_CARE_PROVIDER_SITE_OTHER): Payer: Self-pay

## 2020-08-17 ENCOUNTER — Other Ambulatory Visit: Payer: Self-pay

## 2020-08-17 DIAGNOSIS — I2699 Other pulmonary embolism without acute cor pulmonale: Secondary | ICD-10-CM

## 2020-08-17 DIAGNOSIS — I240 Acute coronary thrombosis not resulting in myocardial infarction: Secondary | ICD-10-CM

## 2020-08-17 DIAGNOSIS — D6859 Other primary thrombophilia: Secondary | ICD-10-CM

## 2020-08-17 DIAGNOSIS — Z7901 Long term (current) use of anticoagulants: Secondary | ICD-10-CM

## 2020-08-17 DIAGNOSIS — Z5181 Encounter for therapeutic drug level monitoring: Secondary | ICD-10-CM

## 2020-08-17 LAB — POCT INR: INR: 3 (ref 2.0–3.0)

## 2020-08-17 NOTE — Patient Instructions (Signed)
Continue with 1 tablet daily except 1/2 tablet on Mondays, Wednesdays and Fridays. Recheck in 6 weeks.  Call us with any medication changes #[phone] or concerns.  

## 2020-09-28 ENCOUNTER — Ambulatory Visit (INDEPENDENT_AMBULATORY_CARE_PROVIDER_SITE_OTHER): Payer: Self-pay

## 2020-09-28 ENCOUNTER — Other Ambulatory Visit: Payer: Self-pay

## 2020-09-28 DIAGNOSIS — D6859 Other primary thrombophilia: Secondary | ICD-10-CM

## 2020-09-28 DIAGNOSIS — I2699 Other pulmonary embolism without acute cor pulmonale: Secondary | ICD-10-CM

## 2020-09-28 DIAGNOSIS — Z7901 Long term (current) use of anticoagulants: Secondary | ICD-10-CM

## 2020-09-28 DIAGNOSIS — I240 Acute coronary thrombosis not resulting in myocardial infarction: Secondary | ICD-10-CM

## 2020-09-28 DIAGNOSIS — Z5181 Encounter for therapeutic drug level monitoring: Secondary | ICD-10-CM

## 2020-09-28 LAB — POCT INR: INR: 4.9 — AB (ref 2.0–3.0)

## 2020-09-28 NOTE — Patient Instructions (Signed)
Hold today and tomorrow and then Continue with 1 tablet daily except 1/2 tablet on Mondays, Wednesdays and Fridays. Recheck in 6 weeks.  Call us with any medication changes #[phone] or concerns.

## 2020-10-27 MED FILL — WARFARIN NA 10 MG TAB: 10 | 43 days supply | Qty: 60 | Fill #1

## 2020-11-06 ENCOUNTER — Other Ambulatory Visit: Payer: Self-pay | Admitting: Internal Medicine

## 2020-11-06 ENCOUNTER — Encounter: Payer: Self-pay | Admitting: Internal Medicine

## 2020-11-06 ENCOUNTER — Ambulatory Visit (INDEPENDENT_AMBULATORY_CARE_PROVIDER_SITE_OTHER): Payer: Self-pay | Admitting: Internal Medicine

## 2020-11-06 ENCOUNTER — Ambulatory Visit (INDEPENDENT_AMBULATORY_CARE_PROVIDER_SITE_OTHER): Payer: Self-pay

## 2020-11-06 ENCOUNTER — Other Ambulatory Visit: Payer: Self-pay

## 2020-11-06 VITALS — BP 113/78 | HR 92 | Ht 71.0 in | Wt 307.4 lb

## 2020-11-06 DIAGNOSIS — I2699 Other pulmonary embolism without acute cor pulmonale: Secondary | ICD-10-CM

## 2020-11-06 DIAGNOSIS — I255 Ischemic cardiomyopathy: Secondary | ICD-10-CM

## 2020-11-06 DIAGNOSIS — Z951 Presence of aortocoronary bypass graft: Secondary | ICD-10-CM

## 2020-11-06 DIAGNOSIS — Z5181 Encounter for therapeutic drug level monitoring: Secondary | ICD-10-CM

## 2020-11-06 DIAGNOSIS — D6859 Other primary thrombophilia: Secondary | ICD-10-CM

## 2020-11-06 DIAGNOSIS — E118 Type 2 diabetes mellitus with unspecified complications: Secondary | ICD-10-CM

## 2020-11-06 DIAGNOSIS — Z9114 Patient's other noncompliance with medication regimen: Secondary | ICD-10-CM

## 2020-11-06 DIAGNOSIS — Z794 Long term (current) use of insulin: Secondary | ICD-10-CM

## 2020-11-06 DIAGNOSIS — I5022 Chronic systolic (congestive) heart failure: Secondary | ICD-10-CM

## 2020-11-06 DIAGNOSIS — E785 Hyperlipidemia, unspecified: Secondary | ICD-10-CM

## 2020-11-06 DIAGNOSIS — I240 Acute coronary thrombosis not resulting in myocardial infarction: Secondary | ICD-10-CM

## 2020-11-06 DIAGNOSIS — Z7901 Long term (current) use of anticoagulants: Secondary | ICD-10-CM

## 2020-11-06 LAB — POCT INR: INR: 2.6 (ref 2.0–3.0)

## 2020-11-06 MED ORDER — ATORVASTATIN CALCIUM 80 MG PO TABS: 80.0000 mg | ORAL_TABLET | Freq: Every day | ORAL | 3 refills | Status: DC

## 2020-11-06 MED ORDER — METFORMIN HCL 1000 MG PO TABS: 1000.0000 mg | ORAL_TABLET | Freq: Two times a day (BID) | ORAL | 3 refills | Status: DC

## 2020-11-06 MED FILL — ATORVASTATIN 80 MG TABLET: 80 | 90 days supply | Qty: 90 | Fill #0

## 2020-11-06 MED FILL — METFORMIN HCL 1000 MG TABS: 1000 | 90 days supply | Qty: 180 | Fill #0

## 2020-11-06 NOTE — Patient Instructions (Addendum)
Medication Instructions:  START atorvastatin 80mg  daily (for cholesterol)  START farxiga 10mg  daily - please complete patient assistance application and return to Dr. office  *If you need a refill on your cardiac medications before your next appointment, please call your pharmacy*   Lab Work: BMET (non-fasting) about 2 weeks after starting metformin  FASTING lab work in 3 months - lipid panel, A1c  If you have labs (blood work) drawn today and your tests are completely normal, you will receive your results only by: MyChart Message (if you have MyChart) OR . A paper copy in the mail If you have any lab test that is abnormal or we need to change your treatment, we will call you to review the results.   Testing/Procedures: Your physician has requested that you have an echocardiogram. Echocardiography is a painless test that uses sound waves to create images of your heart. It provides your doctor with information about the size and shape of your heart and how well your heart's chambers and valves are working. This procedure takes approximately one hour. There are no restrictions for this procedure. -- [address] - [address]   Follow-Up: At [facility], you and your health needs are our priority.  As part of our continuing mission to provide you with exceptional heart care, we have created designated Provider Care Teams.  These Care Teams include your primary Cardiologist (physician) and Advanced Practice Providers (APPs -  Physician Assistants and Nurse Practitioners) who all work together to provide you with the care you need, when you need it.  We recommend signing up for the patient portal called "MyChart".  Sign up information is provided on this After Visit Summary.  MyChart is used to connect with patients for Virtual Visits (Telemedicine).  Patients are able to view lab/test results, encounter notes, upcoming appointments, etc.  Non-urgent messages can be sent to  your provider as well.   To learn more about what you can do with MyChart, go to [facility].    Your next appointment:   3 month(s)  The format for your next appointment:   In Person  Provider:   You may see [employer], MD or one of the following Advanced Practice Providers on your designated Care Team:    [email or web address], PA-C  Chrystie Nose, PA-C or   Azalee Course, Micah Flesher    Other Instructions

## 2020-11-06 NOTE — Patient Instructions (Signed)
Continue with 1 tablet daily except 1/2 tablet on Mondays, Wednesdays and Fridays. Recheck in 6 weeks.  Call us with any medication changes #[phone] or concerns.
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
## 2020-11-06 NOTE — Progress Notes (Signed)
Heart and Vascular Care Navigation  11/06/2020  [place]. Feb 06, 1971 [id number]  Reason for Referral:  Uninsured, financial challenges                                                                                               Assessment:          Pt referred to LCSW as he is currently uninsured and in need of ongoing medication and medical management.   LCSW met with pt in clinic. Introduced self, role, reason for visit. Pt confirmed home address, emergency contacts and PCP. He usually gets his medications filled at the [facility] but has utilized [facility] and felt it was more affordable.  Pt has been out of work since roughly 2019. He has applied for Medicaid and Disability twice and been denied. He is working with a Sports coach firm on his disability appeal. He is currently receiving Long Term Disability payments. He is able to make ends meet but it can be difficult and he does struggle to pay for his medications/medical bills.  LCSW shared some resources that I could assist pt with completing. We reviewed CAFA Water engineer Assistance), [place] Medassist applications and SNAP referral to [facility]. All of these, along w/ needed documentation was reviewed w/ the patient and he will work on gathering needed documentation.                     HRT/VAS Care Coordination    Patients Home Cardiology Office [place] Team Social Worker   Social Worker Name: Margarito Liner [place], [phone]   Living arrangements for the past 2 months Single Family Home   Lives with: Significant Other; Parents   Patient Current Insurance Coverage Self-Pay   Patient Has Concern With Paying Medical Bills Yes   Patient Concerns With Medical Bills past medical bills;  in need of ongoing medical w/u   Medical Bill Referrals: CAFA   Does Patient Have Prescription Coverage? No   Patient Prescription Assistance Programs [place] Medassist;  Patient Assistance Programs   [place] Medassist Medications application provided   Patient Assistance Programs Medications [place] [employer] Devices/Equipment Oxygen      Social History:                                                                             SDOH Screenings   Alcohol Screen: Not on file  Depression (PHQ2-9): Not on file  Financial Resource Strain: High Risk   Difficulty of Paying Living Expenses: Hard  Food Insecurity: Food Insecurity Present   Worried About Running Out of Food in the Last Year: Sometimes true   Ran Out of Food in the Last Year: Sometimes true  Housing: [place]  Score: 0  Physical Activity: Not on file  Social Connections: Not on file  Stress: Not on file  Tobacco Use: Low Risk    Smoking Tobacco Use: Never Smoker   Smokeless Tobacco Use: Never Used  Transportation Needs: No Transportation Needs   Lack of Transportation (Medical): No   Lack of Transportation (Non-Medical): No    SDOH Interventions: Financial Resources:  Financial Strain Interventions: Other (Comment) ([place] MedAssist)   Food Insecurity:  Food Insecurity Interventions: Assist with Chief Operating Officer (referral to [employer])  Housing Insecurity:  Housing Interventions: Intervention Not Indicated  Transportation:   Transportation Interventions: [employer] (discussed w/ pt; pt currently drives himself to appts)    Other Care Navigation Interventions:     Provided Pharmacy assistance resources [place] Programs   Follow-up plan: LCSW will send referral for [employer] to [employer]. I will f/u regarding additional patient assistance documents next week. Pt aware, he has my card for any additional concerns/questions in the mean time.

## 2020-11-09 ENCOUNTER — Telehealth: Payer: Self-pay | Admitting: Licensed Clinical Social Worker

## 2020-11-09 NOTE — Telephone Encounter (Signed)
Securely sent referral to [facility] for [facility] application and securely faxed [phone] to IRS for letter of nonfiling. Will f/u on additional assistance applications tomorrow once missing items reviewed. Pt was given  MedAssist application and CAFA application.   Octavio Graves, MSW, LCSW [address] [facility] Health Heart/Vascular Care Navigation  [phone]

## 2020-11-11 ENCOUNTER — Telehealth: Payer: Self-pay | Admitting: Licensed Clinical Social Worker

## 2020-11-11 NOTE — Telephone Encounter (Signed)
LCSW attempted to reach pt via telephone. Unable to reach pt at [phone]. Unable to leave voicemail as it is full at this time. Will reattempt later.  Octavio Graves, MSW, LCSW [facility] Health Heart/Vascular Care Navigation  [phone]

## 2020-11-11 NOTE — Telephone Encounter (Signed)
Attempted again to reach pt at [phone]. No answer and again unable to leave voicemail. Will reattempt one more time in the morning and then attempt to send a text message to pt to f/u on assistance applications.   Marcus Joseph, MSW, LCSW [facility] Health Heart/Vascular Care Navigation  [phone]

## 2020-11-12 ENCOUNTER — Telehealth: Payer: Self-pay | Admitting: Licensed Clinical Social Worker

## 2020-11-12 NOTE — Telephone Encounter (Signed)
Attempted to call pt again at [phone]. Voicemail full, no answer. HIPAA compliant text message sent to pt.   Marcus Joseph, MSW, LCSW [facility] Health Heart/Vascular Care Navigation  [phone]

## 2020-11-13 ENCOUNTER — Telehealth: Payer: Self-pay | Admitting: Licensed Clinical Social Worker

## 2020-11-13 NOTE — Telephone Encounter (Signed)
LCSW received text back from pt stating he is having challenges w/ his phone. He has just now started working on applications. Encouraged him to call me next week to arrange a time to look over them/submit as needed.   Octavio Graves, MSW, LCSW [facility] Health Heart/Vascular Care Navigation  [phone]

## 2020-11-13 NOTE — Telephone Encounter (Signed)
No response from text sent on 3/17, I attempted for a fourth time today to call pt. Still no answer, unable to leave voicemail. Remain available, pt has my contact information for any additional questions/concerns.   Marcus Joseph, MSW, LCSW [facility] Health Heart/Vascular Care Navigation  [phone]

## 2020-11-17 ENCOUNTER — Telehealth: Payer: Self-pay | Admitting: Licensed Clinical Social Worker

## 2020-11-17 NOTE — Telephone Encounter (Addendum)
LCSW sent f/u text message to pt regarding assistance applications provided.  Remain available to assist w/ completion/submission when pt ready.   Addendum: pt responded, shared there was a death in the family but he has been working on the paperwork. I let him know that I remain available to assist and shared my condolences.   Octavio Graves, MSW, LCSW [facility] Health Heart/Vascular Care Navigation  [phone]

## 2020-11-25 ENCOUNTER — Telehealth: Payer: Self-pay | Admitting: Licensed Clinical Social Worker

## 2020-11-25 NOTE — Telephone Encounter (Signed)
Attempted to reach pt via telephone at [phone]. Still unable to leave voicemail- text message f/u sent to pt inquiring about assistance applications provided during last appointment.   Marcus Joseph, MSW, LCSW [facility] Health Heart/Vascular Care Navigation  [phone]

## 2020-11-30 ENCOUNTER — Telehealth: Payer: Self-pay | Admitting: Licensed Clinical Social Worker

## 2020-11-30 NOTE — Telephone Encounter (Signed)
LCSW has not heard back from pt since 3/22 despite attempts to call and text. Will make note to try and reach pt during his appointment on 4/22.   Octavio Graves, MSW, LCSW [facility] Health Heart/Vascular Care Navigation  [phone]

## 2020-12-03 ENCOUNTER — Ambulatory Visit (HOSPITAL_COMMUNITY): Payer: Self-pay | Attending: Cardiovascular Disease

## 2020-12-03 ENCOUNTER — Other Ambulatory Visit: Payer: Self-pay

## 2020-12-03 DIAGNOSIS — I255 Ischemic cardiomyopathy: Secondary | ICD-10-CM | POA: Insufficient documentation

## 2020-12-03 DIAGNOSIS — Z951 Presence of aortocoronary bypass graft: Secondary | ICD-10-CM | POA: Insufficient documentation

## 2020-12-03 LAB — ECHOCARDIOGRAM COMPLETE
Area-P 1/2: 3.63 cm2
S' Lateral: 3.7 cm

## 2020-12-03 MED ORDER — PERFLUTREN LIPID MICROSPHERE
1.0000 mL | INTRAVENOUS | Status: AC | PRN
Start: 2020-12-03 — End: 2020-12-03
  Administered 2020-12-03: 3 mL via INTRAVENOUS

## 2020-12-18 ENCOUNTER — Other Ambulatory Visit: Payer: Self-pay

## 2020-12-18 ENCOUNTER — Ambulatory Visit (INDEPENDENT_AMBULATORY_CARE_PROVIDER_SITE_OTHER): Payer: Self-pay

## 2020-12-18 DIAGNOSIS — I2699 Other pulmonary embolism without acute cor pulmonale: Secondary | ICD-10-CM

## 2020-12-18 DIAGNOSIS — Z7901 Long term (current) use of anticoagulants: Secondary | ICD-10-CM

## 2020-12-18 DIAGNOSIS — Z5181 Encounter for therapeutic drug level monitoring: Secondary | ICD-10-CM

## 2020-12-18 DIAGNOSIS — D6859 Other primary thrombophilia: Secondary | ICD-10-CM

## 2020-12-18 DIAGNOSIS — I240 Acute coronary thrombosis not resulting in myocardial infarction: Secondary | ICD-10-CM

## 2020-12-18 LAB — POCT INR: INR: 2.1 (ref 2.0–3.0)

## 2020-12-18 NOTE — Progress Notes (Signed)
Heart and Vascular Care Navigation  12/18/2020  Marcus Joseph [place]. [birth year] [id number]  Reason for Referral:  Engaged with patient face to face for follow up visit for Heart and Vascular Care Coordination.                                                                                                   Assessment:            LCSW met with pt at the end of his coumadin appointment today. LCSW re-introduced self, role, reason for visit. I inquired about pt status with the assistance applications provided during the last appointment. Pt shares he knows where they are at in his home but hasn't really completed them. Pt also shares he had spoken with [facility] regarding SNAP, they were going to send him some paperwork but he's not sure if they sent it and hasnt followed up on it. I ensured he has their number and requested he call and let them know about that.   He shares that his ongoing health needs and trying to manage all of his appointments etc has been tricky and he feels he has more difficulty with short term memory/task completion. Provided support for the frustration that must create.  LCSW shared that I remain available to help pt complete applications, I let him know that once he has complete/recieved documents he is welcome to drop things by the office and we can try and keep a list of what's missing to provide pt with more specific tasks. I encouraged him that the short term stress of working on the applications can potentially help with the ongoing stress around bills and managing medication costs.                          HRT/VAS Care Coordination    Patients Home Cardiology Office [facility]   Outpatient Care Team Social Worker   Social Worker Name: Esmeralda Links [place], [phone]   Living arrangements for the past 2 months Single Family Home   Lives with: Significant Other; Parents   Patient Current Insurance Coverage Self-Pay   Patient  Has Concern With Paying Medical Bills Yes   Patient Concerns With Medical Bills past medical bills;  in need of ongoing medical w/u   Medical Bill Referrals: CAFA   Does Patient Have Prescription Coverage? No   Patient Prescription Assistance Programs [place] Medassist; Patient Assistance Programs   [place] Medassist Medications application provided   Patient Assistance Programs Medications farxiga   Home Assistive Devices/Equipment Oxygen      Social History:                                                                             SDOH Screenings  Alcohol Screen: Not on file  Depression (RCO4-8): Not on file  Financial Resource Strain: High Risk  . Difficulty of Paying Living Expenses: Hard  Food Insecurity: Food Insecurity Present  . Worried About Charity fundraiser in the Last Year: Sometimes true  . Ran Out of Food in the Last Year: Sometimes true  Housing: Low Risk   . Last Housing Risk Score: 0  Physical Activity: Not on file  Social Connections: Not on file  Stress: Not on file  Tobacco Use: Low Risk   . Smoking Tobacco Use: Never Smoker  . Smokeless Tobacco Use: Never Used  Transportation Needs: No Transportation Needs  . Lack of Transportation (Medical): No  . Lack of Transportation (Non-Medical): No    SDOH Interventions: Financial Resources:   f/u on CAFA application/[employer]edAssist applications provided  Food Insecurity:   f/u on referral to [place]:   n/a  Transportation:    n/a    Other Care Navigation Interventions:     Provided Pharmacy assistance resources  f/u on NCMedAssist application   Follow-up plan:   Pt has my number and will try and work on applications provided. LCSW will f/u with him in one week. Encouraged him to try and clear out his voicemail.

## 2020-12-18 NOTE — Patient Instructions (Signed)
Continue with 1 tablet daily except 1/2 tablet on Mondays, Wednesdays and Fridays. Recheck in 8 weeks.  Call us with any medication changes #[phone] or concerns.   

## 2020-12-23 ENCOUNTER — Telehealth: Payer: Self-pay | Admitting: Licensed Clinical Social Worker

## 2020-12-23 NOTE — Telephone Encounter (Signed)
Reminder text message sent to pt phone, [phone] per his request last appointment, that I remain available to assistance w/ reviewing assistance applications and sending in completed applications/documents as needed for pt.    Octavio Graves, MSW, LCSW [facility] Health Heart/Vascular Care Navigation  [phone]

## 2020-12-30 ENCOUNTER — Telehealth: Payer: Self-pay | Admitting: Licensed Clinical Social Worker

## 2020-12-30 NOTE — Telephone Encounter (Signed)
LCSW attempted again to reach pt this morning regarding assistance applications and reminding him to bring any completed applications to our office. Pt responded back with "ok thank you". This will be my last reminder at this time as pt has been given > 5 reminders and guided on how to complete applications.  I remain available as needed if pt would like to complete applications.   Octavio Graves, MSW, LCSW [facility] Health Heart/Vascular Care Navigation  [phone]

## 2021-02-02 ENCOUNTER — Other Ambulatory Visit (HOSPITAL_BASED_OUTPATIENT_CLINIC_OR_DEPARTMENT_OTHER): Payer: Self-pay

## 2021-02-02 ENCOUNTER — Other Ambulatory Visit: Payer: Self-pay | Admitting: Internal Medicine

## 2021-02-02 DIAGNOSIS — I240 Acute coronary thrombosis not resulting in myocardial infarction: Secondary | ICD-10-CM

## 2021-02-02 DIAGNOSIS — Z7901 Long term (current) use of anticoagulants: Secondary | ICD-10-CM

## 2021-02-02 MED FILL — Warfarin Sodium Tab 10 MG: ORAL | 90 days supply | Qty: 90 | Fill #0 | Status: AC

## 2021-02-03 ENCOUNTER — Other Ambulatory Visit (HOSPITAL_BASED_OUTPATIENT_CLINIC_OR_DEPARTMENT_OTHER): Payer: Self-pay

## 2021-02-08 ENCOUNTER — Ambulatory Visit (INDEPENDENT_AMBULATORY_CARE_PROVIDER_SITE_OTHER): Payer: Self-pay | Admitting: Physician Assistant

## 2021-02-08 ENCOUNTER — Encounter: Payer: Self-pay | Admitting: Physician Assistant

## 2021-02-08 ENCOUNTER — Ambulatory Visit (INDEPENDENT_AMBULATORY_CARE_PROVIDER_SITE_OTHER): Payer: Self-pay

## 2021-02-08 ENCOUNTER — Other Ambulatory Visit: Payer: Self-pay

## 2021-02-08 ENCOUNTER — Other Ambulatory Visit (HOSPITAL_BASED_OUTPATIENT_CLINIC_OR_DEPARTMENT_OTHER): Payer: Self-pay

## 2021-02-08 VITALS — BP 120/80 | HR 93 | Ht 71.0 in | Wt 302.0 lb

## 2021-02-08 DIAGNOSIS — I25810 Atherosclerosis of coronary artery bypass graft(s) without angina pectoris: Secondary | ICD-10-CM

## 2021-02-08 DIAGNOSIS — Z7901 Long term (current) use of anticoagulants: Secondary | ICD-10-CM

## 2021-02-08 DIAGNOSIS — D6859 Other primary thrombophilia: Secondary | ICD-10-CM

## 2021-02-08 DIAGNOSIS — E785 Hyperlipidemia, unspecified: Secondary | ICD-10-CM

## 2021-02-08 DIAGNOSIS — I240 Acute coronary thrombosis not resulting in myocardial infarction: Secondary | ICD-10-CM

## 2021-02-08 DIAGNOSIS — I824Y3 Acute embolism and thrombosis of unspecified deep veins of proximal lower extremity, bilateral: Secondary | ICD-10-CM

## 2021-02-08 DIAGNOSIS — I2699 Other pulmonary embolism without acute cor pulmonale: Secondary | ICD-10-CM

## 2021-02-08 DIAGNOSIS — E119 Type 2 diabetes mellitus without complications: Secondary | ICD-10-CM

## 2021-02-08 DIAGNOSIS — Z5181 Encounter for therapeutic drug level monitoring: Secondary | ICD-10-CM

## 2021-02-08 LAB — POCT INR: INR: 3.6 — AB (ref 2.0–3.0)

## 2021-02-08 MED ORDER — HYDRALAZINE HCL 25 MG PO TABS
12.5000 mg | ORAL_TABLET | Freq: Two times a day (BID) | ORAL | 3 refills | Status: DC
Start: 2021-02-08 — End: 2022-03-21
  Filled 2021-02-08 – 2021-06-18 (×2): qty 90, 90d supply, fill #0
  Filled 2021-10-28: qty 90, 90d supply, fill #1
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
## 2021-02-08 NOTE — Patient Instructions (Signed)
Medication Instructions:  INCREASE Hydralazine to 12.5 mg 2 times a day   *If you need a refill on your cardiac medications before your next appointment, please call your pharmacy*  Lab Work: NONE ordered at this time of appointment   If you have labs (blood work) drawn today and your tests are completely normal, you will receive your results only by: MyChart Message (if you have MyChart) OR A paper copy in the mail If you have any lab test that is abnormal or we need to change your treatment, we will call you to review the results.  Testing/Procedures: NONE ordered at this time of appointment    Follow-Up: At [facility], you and your health needs are our priority.  As part of our continuing mission to provide you with exceptional heart care, we have created designated Provider Care Teams.  These Care Teams include your primary Cardiologist (physician) and Advanced Practice Providers (APPs -  Physician Assistants and Nurse Practitioners) who all work together to provide you with the care you need, when you need it.  We recommend signing up for the patient portal called "MyChart".  Sign up information is provided on this After Visit Summary.  MyChart is used to connect with patients for Virtual Visits (Telemedicine).  Patients are able to view lab/test results, encounter notes, upcoming appointments, etc.  Non-urgent messages can be sent to your provider as well.   To learn more about what you can do with MyChart, go to [email or web address].    Your next appointment:   3-4 month(s)  The format for your next appointment:   In Person  Provider:   K. [place] Hilty, MD  Other Instructions

## 2021-02-08 NOTE — Patient Instructions (Signed)
Hold tonight and tomorrow night and then Continue with 1 tablet daily except 1/2 tablet on Mondays, Wednesdays and Fridays. Recheck in 8 weeks.  Call us with any medication changes #[phone] or concerns.

## 2021-02-09 ENCOUNTER — Telehealth: Payer: Self-pay | Admitting: Licensed Clinical Social Worker

## 2021-02-09 NOTE — Telephone Encounter (Signed)
Reached out to pt again via telephone this morning at [phone]. Pt has been given my number multiple times and I have attempted to have him work on assistance applications with me/answer questions he has on the following dates:   3/11- provided assistance applications and met with pt to explain how to complete, gave pt my card and that I remain available to answer any questions/assist w/ completion  3/16- attempted x2 to reach pt via telephone call, no response  3/17- attempted call x1 and text message x1, no response  3/18- attempted call x1, pt texted back stated he was working on applications  2/37- attempted text reminder x1, pt responded back he is working on applications still  0/23- attempted call x1, texted reminder x1, no response  There was no f/u from pt until office appt on 4/22. I met with pt again to f/u on assistance applications. Pt confirmed he still had them, has my number and during appointment had both texts and calls coming into his phone so I was able to verify he still had service at that time. I stressed importance of getting applications completed to the best of his ability and setting up a time to come to the office so we could review them together and fix any problems/identify any missing items. Pt has access to Transportation Services if needed but he is currently driving to appts as of 4/22.   4/27- attempted call x1, texted reminder x1 regarding apps and my ability to assist. Pt responded okay thank you.   Pt did not bring paperwork to appt on 6/13, stated to coumadin RN Legrand Como that he would like for me to call him. Today 6/14 to answer and unable to leave voicemail. I remain available should pt reach out to clinic with questions however at this time LCSW has made multiple attempts to assist pt w/ no further engagement from pt.   Westley Hummer, MSW, [place]  [phone]

## 2021-02-15 ENCOUNTER — Other Ambulatory Visit (HOSPITAL_BASED_OUTPATIENT_CLINIC_OR_DEPARTMENT_OTHER): Payer: Self-pay

## 2021-04-05 ENCOUNTER — Other Ambulatory Visit: Payer: Self-pay

## 2021-04-05 ENCOUNTER — Ambulatory Visit (INDEPENDENT_AMBULATORY_CARE_PROVIDER_SITE_OTHER): Payer: 59

## 2021-04-05 DIAGNOSIS — D6859 Other primary thrombophilia: Secondary | ICD-10-CM

## 2021-04-05 DIAGNOSIS — Z5181 Encounter for therapeutic drug level monitoring: Secondary | ICD-10-CM

## 2021-04-05 DIAGNOSIS — I2699 Other pulmonary embolism without acute cor pulmonale: Secondary | ICD-10-CM

## 2021-04-05 DIAGNOSIS — I240 Acute coronary thrombosis not resulting in myocardial infarction: Secondary | ICD-10-CM | POA: Diagnosis not present

## 2021-04-05 DIAGNOSIS — Z7901 Long term (current) use of anticoagulants: Secondary | ICD-10-CM | POA: Diagnosis not present

## 2021-04-05 LAB — POCT INR: INR: 2.1 (ref 2.0–3.0)

## 2021-04-05 NOTE — Patient Instructions (Signed)
Continue with 1 tablet daily except 1/2 tablet on Mondays, Wednesdays and Fridays. Recheck in 8 weeks.  Call us with any medication changes #[phone] or concerns.   

## 2021-05-31 ENCOUNTER — Ambulatory Visit (INDEPENDENT_AMBULATORY_CARE_PROVIDER_SITE_OTHER): Payer: 59

## 2021-05-31 ENCOUNTER — Other Ambulatory Visit: Payer: Self-pay

## 2021-05-31 DIAGNOSIS — I2699 Other pulmonary embolism without acute cor pulmonale: Secondary | ICD-10-CM

## 2021-05-31 DIAGNOSIS — I240 Acute coronary thrombosis not resulting in myocardial infarction: Secondary | ICD-10-CM | POA: Diagnosis not present

## 2021-05-31 DIAGNOSIS — Z5181 Encounter for therapeutic drug level monitoring: Secondary | ICD-10-CM | POA: Diagnosis not present

## 2021-05-31 DIAGNOSIS — D6859 Other primary thrombophilia: Secondary | ICD-10-CM | POA: Diagnosis not present

## 2021-05-31 DIAGNOSIS — Z7901 Long term (current) use of anticoagulants: Secondary | ICD-10-CM

## 2021-05-31 LAB — POCT INR: INR: 2.9 (ref 2.0–3.0)

## 2021-05-31 NOTE — Patient Instructions (Signed)
Continue with 1 tablet daily except 1/2 tablet on Mondays, Wednesdays and Fridays. Recheck in 8 weeks.  Call us with any medication changes #[phone] or concerns.

## 2021-06-18 ENCOUNTER — Other Ambulatory Visit: Payer: Self-pay | Admitting: Internal Medicine

## 2021-06-18 ENCOUNTER — Other Ambulatory Visit (HOSPITAL_BASED_OUTPATIENT_CLINIC_OR_DEPARTMENT_OTHER): Payer: Self-pay

## 2021-06-18 DIAGNOSIS — I240 Acute coronary thrombosis not resulting in myocardial infarction: Secondary | ICD-10-CM

## 2021-06-18 DIAGNOSIS — Z7901 Long term (current) use of anticoagulants: Secondary | ICD-10-CM

## 2021-06-18 MED FILL — Atorvastatin Calcium Tab 80 MG (Base Equivalent): ORAL | 90 days supply | Qty: 90 | Fill #0 | Status: AC

## 2021-06-21 ENCOUNTER — Other Ambulatory Visit (HOSPITAL_BASED_OUTPATIENT_CLINIC_OR_DEPARTMENT_OTHER): Payer: Self-pay

## 2021-06-21 MED ORDER — WARFARIN SODIUM 10 MG PO TABS
10.0000 mg | ORAL_TABLET | Freq: Every day | ORAL | 0 refills | Status: DC
  Filled 2021-06-21: qty 90, 90d supply, fill #0

## 2021-06-21 NOTE — Telephone Encounter (Signed)
Prescription refill request received for warfarin  Lov: 02/08/2021 Next INR check: 10/31 Warfarin tablet strength: 10mg    Refill sent

## 2021-06-22 ENCOUNTER — Other Ambulatory Visit (HOSPITAL_BASED_OUTPATIENT_CLINIC_OR_DEPARTMENT_OTHER): Payer: Self-pay

## 2021-06-23 ENCOUNTER — Other Ambulatory Visit (HOSPITAL_BASED_OUTPATIENT_CLINIC_OR_DEPARTMENT_OTHER): Payer: Self-pay

## 2021-06-28 ENCOUNTER — Ambulatory Visit (INDEPENDENT_AMBULATORY_CARE_PROVIDER_SITE_OTHER): Payer: 59 | Admitting: Internal Medicine

## 2021-06-28 ENCOUNTER — Other Ambulatory Visit: Payer: Self-pay

## 2021-06-28 ENCOUNTER — Encounter: Payer: Self-pay | Admitting: Internal Medicine

## 2021-06-28 ENCOUNTER — Ambulatory Visit (INDEPENDENT_AMBULATORY_CARE_PROVIDER_SITE_OTHER): Payer: 59

## 2021-06-28 VITALS — BP 120/82 | HR 93 | Ht 71.0 in | Wt 312.0 lb

## 2021-06-28 DIAGNOSIS — D6859 Other primary thrombophilia: Secondary | ICD-10-CM

## 2021-06-28 DIAGNOSIS — Z794 Long term (current) use of insulin: Secondary | ICD-10-CM

## 2021-06-28 DIAGNOSIS — Z951 Presence of aortocoronary bypass graft: Secondary | ICD-10-CM

## 2021-06-28 DIAGNOSIS — Z7901 Long term (current) use of anticoagulants: Secondary | ICD-10-CM

## 2021-06-28 DIAGNOSIS — I2699 Other pulmonary embolism without acute cor pulmonale: Secondary | ICD-10-CM | POA: Diagnosis not present

## 2021-06-28 DIAGNOSIS — E118 Type 2 diabetes mellitus with unspecified complications: Secondary | ICD-10-CM

## 2021-06-28 DIAGNOSIS — I255 Ischemic cardiomyopathy: Secondary | ICD-10-CM

## 2021-06-28 DIAGNOSIS — E785 Hyperlipidemia, unspecified: Secondary | ICD-10-CM | POA: Diagnosis not present

## 2021-06-28 DIAGNOSIS — I240 Acute coronary thrombosis not resulting in myocardial infarction: Secondary | ICD-10-CM

## 2021-06-28 DIAGNOSIS — Z5181 Encounter for therapeutic drug level monitoring: Secondary | ICD-10-CM

## 2021-06-28 LAB — POCT INR: INR: 2.2 (ref 2.0–3.0)

## 2021-06-28 NOTE — Patient Instructions (Signed)
Continue with 1 tablet daily except 1/2 tablet on Mondays, Wednesdays and Fridays. Recheck in 8 weeks.  Call us with any medication changes #[phone] or concerns.
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
## 2021-06-28 NOTE — Patient Instructions (Addendum)
Medication Instructions:  TAKE atorvastatin daily in the evening  *If you need a refill on your cardiac medications before your next appointment, please call your pharmacy*   Lab Work: FASTING lab work to check cholesterol in about 3 months   If you have labs (blood work) drawn today and your tests are completely normal, you will receive your results only by: MyChart Message (if you have MyChart) OR A paper copy in the mail If you have any lab test that is abnormal or we need to change your treatment, we will call you to review the results.  Follow-Up: At [facility], you and your health needs are our priority.  As part of our continuing mission to provide you with exceptional heart care, we have created designated Provider Care Teams.  These Care Teams include your primary Cardiologist (physician) and Advanced Practice Providers (APPs -  Physician Assistants and Nurse Practitioners) who all work together to provide you with the care you need, when you need it.  We recommend signing up for the patient portal called "MyChart".  Sign up information is provided on this After Visit Summary.  MyChart is used to connect with patients for Virtual Visits (Telemedicine).  Patients are able to view lab/test results, encounter notes, upcoming appointments, etc.  Non-urgent messages can be sent to your provider as well.   To learn more about what you can do with MyChart, go to [email or web address].    Your next appointment:   3 month(s) - OK to double book on DOD day  The format for your next appointment:   In Office  Provider:   You may see Chrystie Nose, MD or one of the following Advanced Practice Providers on your designated Care Team:   Azalee Course, PA-C Micah Flesher, PA-C or  Judy Pimple, [place]   Other Instructions

## 2021-08-24 ENCOUNTER — Ambulatory Visit (INDEPENDENT_AMBULATORY_CARE_PROVIDER_SITE_OTHER): Payer: 59 | Admitting: Pharmacist Clinician (PhC)/ Clinical Pharmacy Specialist

## 2021-08-24 ENCOUNTER — Other Ambulatory Visit: Payer: Self-pay

## 2021-08-24 DIAGNOSIS — Z7901 Long term (current) use of anticoagulants: Secondary | ICD-10-CM | POA: Diagnosis not present

## 2021-08-24 DIAGNOSIS — Z86711 Personal history of pulmonary embolism: Secondary | ICD-10-CM | POA: Diagnosis not present

## 2021-08-24 DIAGNOSIS — I2699 Other pulmonary embolism without acute cor pulmonale: Secondary | ICD-10-CM

## 2021-08-24 DIAGNOSIS — I240 Acute coronary thrombosis not resulting in myocardial infarction: Secondary | ICD-10-CM | POA: Diagnosis not present

## 2021-08-24 DIAGNOSIS — D6859 Other primary thrombophilia: Secondary | ICD-10-CM | POA: Diagnosis not present

## 2021-08-24 LAB — POCT INR: INR: 1.4 — AB (ref 2.0–3.0)

## 2021-09-07 ENCOUNTER — Ambulatory Visit (INDEPENDENT_AMBULATORY_CARE_PROVIDER_SITE_OTHER): Payer: 59 | Admitting: *Deleted

## 2021-09-07 ENCOUNTER — Other Ambulatory Visit: Payer: Self-pay

## 2021-09-07 DIAGNOSIS — I2699 Other pulmonary embolism without acute cor pulmonale: Secondary | ICD-10-CM

## 2021-09-07 DIAGNOSIS — Z7901 Long term (current) use of anticoagulants: Secondary | ICD-10-CM | POA: Diagnosis not present

## 2021-09-07 DIAGNOSIS — Z5181 Encounter for therapeutic drug level monitoring: Secondary | ICD-10-CM

## 2021-09-07 DIAGNOSIS — I240 Acute coronary thrombosis not resulting in myocardial infarction: Secondary | ICD-10-CM

## 2021-09-07 LAB — POCT INR: INR: 2.3 (ref 2.0–3.0)

## 2021-09-07 NOTE — Patient Instructions (Signed)
Description   Continue with 1 tablet daily except 1/2 tablet on Mondays, Wednesdays and Fridays. Recheck in 3 weeks.  Call us with any medication changes [phone] or concerns.

## 2021-09-28 ENCOUNTER — Ambulatory Visit (INDEPENDENT_AMBULATORY_CARE_PROVIDER_SITE_OTHER): Payer: 59 | Admitting: *Deleted

## 2021-09-28 ENCOUNTER — Other Ambulatory Visit: Payer: Self-pay

## 2021-09-28 DIAGNOSIS — Z5181 Encounter for therapeutic drug level monitoring: Secondary | ICD-10-CM | POA: Diagnosis not present

## 2021-09-28 DIAGNOSIS — Z7901 Long term (current) use of anticoagulants: Secondary | ICD-10-CM | POA: Diagnosis not present

## 2021-09-28 DIAGNOSIS — I240 Acute coronary thrombosis not resulting in myocardial infarction: Secondary | ICD-10-CM

## 2021-09-28 DIAGNOSIS — I2699 Other pulmonary embolism without acute cor pulmonale: Secondary | ICD-10-CM

## 2021-09-28 LAB — POCT INR: INR: 3.4 — AB (ref 2.0–3.0)

## 2021-09-28 NOTE — Patient Instructions (Signed)
Description   Hold warfarin today, then Continue with 1 tablet daily except 1/2 tablet on Mondays, Wednesdays and Fridays. Recheck in 3 weeks.  Call us with any medication changes #[phone] or concerns.

## 2021-10-19 ENCOUNTER — Other Ambulatory Visit: Payer: Self-pay

## 2021-10-19 ENCOUNTER — Ambulatory Visit (INDEPENDENT_AMBULATORY_CARE_PROVIDER_SITE_OTHER): Payer: 59 | Admitting: *Deleted

## 2021-10-19 DIAGNOSIS — I2699 Other pulmonary embolism without acute cor pulmonale: Secondary | ICD-10-CM

## 2021-10-19 DIAGNOSIS — Z5181 Encounter for therapeutic drug level monitoring: Secondary | ICD-10-CM

## 2021-10-19 DIAGNOSIS — Z7901 Long term (current) use of anticoagulants: Secondary | ICD-10-CM

## 2021-10-19 DIAGNOSIS — I240 Acute coronary thrombosis not resulting in myocardial infarction: Secondary | ICD-10-CM | POA: Diagnosis not present

## 2021-10-19 LAB — POCT INR: INR: 3.6 — AB (ref 2.0–3.0)

## 2021-10-19 NOTE — Patient Instructions (Signed)
Description   Hold warfarin today.  START taking warfarin 1/2 a tablet daily except for 1 tablet on Sunday, Tuesday and Thursday. Recheck INR in 2 weeks. Coumadin Clinic [phone]

## 2021-10-28 ENCOUNTER — Other Ambulatory Visit: Payer: Self-pay | Admitting: Internal Medicine

## 2021-10-28 ENCOUNTER — Other Ambulatory Visit (HOSPITAL_BASED_OUTPATIENT_CLINIC_OR_DEPARTMENT_OTHER): Payer: Self-pay

## 2021-10-28 DIAGNOSIS — Z7901 Long term (current) use of anticoagulants: Secondary | ICD-10-CM

## 2021-10-28 DIAGNOSIS — I240 Acute coronary thrombosis not resulting in myocardial infarction: Secondary | ICD-10-CM

## 2021-10-28 MED ORDER — WARFARIN SODIUM 10 MG PO TABS
10.0000 mg | ORAL_TABLET | Freq: Every day | ORAL | 0 refills | Status: DC
  Filled 2021-10-28: qty 90, 90d supply, fill #0

## 2021-10-28 MED FILL — Atorvastatin Calcium Tab 80 MG (Base Equivalent): ORAL | 90 days supply | Qty: 90 | Fill #1 | Status: AC

## 2021-10-29 ENCOUNTER — Other Ambulatory Visit (HOSPITAL_BASED_OUTPATIENT_CLINIC_OR_DEPARTMENT_OTHER): Payer: Self-pay

## 2021-11-02 ENCOUNTER — Other Ambulatory Visit: Payer: Self-pay

## 2021-11-02 ENCOUNTER — Ambulatory Visit (INDEPENDENT_AMBULATORY_CARE_PROVIDER_SITE_OTHER): Payer: 59 | Admitting: Pharmacist

## 2021-11-02 DIAGNOSIS — I240 Acute coronary thrombosis not resulting in myocardial infarction: Secondary | ICD-10-CM

## 2021-11-02 DIAGNOSIS — Z7901 Long term (current) use of anticoagulants: Secondary | ICD-10-CM

## 2021-11-02 DIAGNOSIS — I2699 Other pulmonary embolism without acute cor pulmonale: Secondary | ICD-10-CM | POA: Diagnosis not present

## 2021-11-02 DIAGNOSIS — D6859 Other primary thrombophilia: Secondary | ICD-10-CM | POA: Diagnosis not present

## 2021-11-02 LAB — POCT INR: INR: 4.6 — AB (ref 2.0–3.0)

## 2021-11-02 NOTE — Patient Instructions (Signed)
Description   ?Hold warfarin today and tomorrow ?START taking warfarin 1/2 a tablet daily except for 1 tablet on Tuesday and Thursday. Recheck INR in 2 weeks. Coumadin Clinic [phone] ?  ? ? ?

## 2021-11-04 ENCOUNTER — Other Ambulatory Visit (HOSPITAL_BASED_OUTPATIENT_CLINIC_OR_DEPARTMENT_OTHER): Payer: Self-pay

## 2021-11-09 ENCOUNTER — Other Ambulatory Visit (HOSPITAL_BASED_OUTPATIENT_CLINIC_OR_DEPARTMENT_OTHER): Payer: Self-pay

## 2021-11-09 MED ORDER — PIOGLITAZONE HCL 30 MG PO TABS
ORAL_TABLET | ORAL | 5 refills | Status: AC
  Filled 2021-11-09: qty 45, 30d supply, fill #0
  Filled 2022-03-21: qty 45, 30d supply, fill #1
  Filled 2022-10-31: qty 45, 30d supply, fill #2

## 2021-11-09 MED ORDER — FENOFIBRATE 160 MG PO TABS
ORAL_TABLET | ORAL | 5 refills | Status: DC
  Filled 2021-11-09: qty 30, 30d supply, fill #0
  Filled 2022-03-21: qty 30, 30d supply, fill #1
  Filled 2022-10-31: qty 30, 30d supply, fill #2

## 2021-11-09 MED ORDER — METOPROLOL TARTRATE 25 MG PO TABS
ORAL_TABLET | ORAL | 4 refills | Status: DC
  Filled 2021-11-09: qty 60, 30d supply, fill #0
  Filled 2022-03-21: qty 60, 30d supply, fill #1
  Filled 2022-10-31: qty 60, 30d supply, fill #2

## 2021-11-09 MED ORDER — HYDRALAZINE HCL 25 MG PO TABS
ORAL_TABLET | ORAL | 1 refills | Status: DC
  Filled 2021-11-09: qty 90, 90d supply, fill #0

## 2021-11-17 ENCOUNTER — Telehealth: Payer: Self-pay

## 2021-11-17 NOTE — Telephone Encounter (Signed)
I tried calling pt to reschedule INR check, but mailbox is full. ?

## 2021-11-19 ENCOUNTER — Telehealth: Payer: Self-pay

## 2021-11-19 NOTE — Telephone Encounter (Signed)
Tried calling patient to reschedule INR check, but mailbox full.  I reached his mother and she will inform patient. ?

## 2021-11-22 ENCOUNTER — Ambulatory Visit (INDEPENDENT_AMBULATORY_CARE_PROVIDER_SITE_OTHER): Payer: 59

## 2021-11-22 ENCOUNTER — Other Ambulatory Visit: Payer: Self-pay

## 2021-11-22 DIAGNOSIS — Z5181 Encounter for therapeutic drug level monitoring: Secondary | ICD-10-CM

## 2021-11-22 DIAGNOSIS — I240 Acute coronary thrombosis not resulting in myocardial infarction: Secondary | ICD-10-CM | POA: Diagnosis not present

## 2021-11-22 DIAGNOSIS — Z7901 Long term (current) use of anticoagulants: Secondary | ICD-10-CM | POA: Diagnosis not present

## 2021-11-22 DIAGNOSIS — I2699 Other pulmonary embolism without acute cor pulmonale: Secondary | ICD-10-CM

## 2021-11-22 DIAGNOSIS — D6859 Other primary thrombophilia: Secondary | ICD-10-CM

## 2021-11-22 LAB — POCT INR: INR: 2.2 (ref 2.0–3.0)

## 2021-11-22 NOTE — Patient Instructions (Signed)
Continue taking warfarin 1/2 a tablet daily except for 1 tablet on Tuesday and Thursday. Recheck INR in 6 weeks. Coumadin Clinic [phone] ?

## 2022-01-03 ENCOUNTER — Ambulatory Visit (INDEPENDENT_AMBULATORY_CARE_PROVIDER_SITE_OTHER): Payer: 59

## 2022-01-03 DIAGNOSIS — I240 Acute coronary thrombosis not resulting in myocardial infarction: Secondary | ICD-10-CM

## 2022-01-03 DIAGNOSIS — I2699 Other pulmonary embolism without acute cor pulmonale: Secondary | ICD-10-CM | POA: Diagnosis not present

## 2022-01-03 DIAGNOSIS — Z5181 Encounter for therapeutic drug level monitoring: Secondary | ICD-10-CM | POA: Diagnosis not present

## 2022-01-03 DIAGNOSIS — D6859 Other primary thrombophilia: Secondary | ICD-10-CM | POA: Diagnosis not present

## 2022-01-03 DIAGNOSIS — Z7901 Long term (current) use of anticoagulants: Secondary | ICD-10-CM

## 2022-01-03 LAB — POCT INR: INR: 3.9 — AB (ref 2.0–3.0)

## 2022-01-03 NOTE — Patient Instructions (Signed)
-  HOLD TODAY ONLY and Eat greens ?Continue taking warfarin 1/2 a tablet daily except for 1 tablet on Tuesday and Thursday. Recheck INR in 3 weeks. Coumadin Clinic [phone] ?

## 2022-01-27 ENCOUNTER — Ambulatory Visit (INDEPENDENT_AMBULATORY_CARE_PROVIDER_SITE_OTHER): Payer: 59

## 2022-01-27 DIAGNOSIS — Z5181 Encounter for therapeutic drug level monitoring: Secondary | ICD-10-CM | POA: Diagnosis not present

## 2022-01-27 DIAGNOSIS — I2699 Other pulmonary embolism without acute cor pulmonale: Secondary | ICD-10-CM

## 2022-01-27 DIAGNOSIS — Z7901 Long term (current) use of anticoagulants: Secondary | ICD-10-CM | POA: Diagnosis not present

## 2022-01-27 DIAGNOSIS — I240 Acute coronary thrombosis not resulting in myocardial infarction: Secondary | ICD-10-CM | POA: Diagnosis not present

## 2022-01-27 DIAGNOSIS — D6859 Other primary thrombophilia: Secondary | ICD-10-CM | POA: Diagnosis not present

## 2022-01-27 LAB — POCT INR: INR: 2.1 (ref 2.0–3.0)

## 2022-01-27 NOTE — Patient Instructions (Signed)
Continue taking warfarin 1/2 a tablet daily except for 1 tablet on Tuesday and Thursday. Recheck INR in 6 weeks. Coumadin Clinic [phone] ?

## 2022-03-10 ENCOUNTER — Ambulatory Visit (INDEPENDENT_AMBULATORY_CARE_PROVIDER_SITE_OTHER): Payer: 59 | Admitting: *Deleted

## 2022-03-10 DIAGNOSIS — Z7901 Long term (current) use of anticoagulants: Secondary | ICD-10-CM

## 2022-03-10 DIAGNOSIS — I240 Acute coronary thrombosis not resulting in myocardial infarction: Secondary | ICD-10-CM

## 2022-03-10 DIAGNOSIS — I2699 Other pulmonary embolism without acute cor pulmonale: Secondary | ICD-10-CM | POA: Diagnosis not present

## 2022-03-10 LAB — POCT INR: INR: 2.9 (ref 2.0–3.0)

## 2022-03-10 NOTE — Patient Instructions (Signed)
Description   Continue taking warfarin 1/2 tablet daily except for 1 tablet on Tuesday and Thursday.  Recheck INR in 6 weeks. Coumadin Clinic [phone]     

## 2022-03-21 ENCOUNTER — Other Ambulatory Visit (HOSPITAL_BASED_OUTPATIENT_CLINIC_OR_DEPARTMENT_OTHER): Payer: Self-pay

## 2022-03-21 ENCOUNTER — Other Ambulatory Visit: Payer: Self-pay | Admitting: Internal Medicine

## 2022-03-21 ENCOUNTER — Other Ambulatory Visit: Payer: Self-pay | Admitting: Physician Assistant

## 2022-03-21 DIAGNOSIS — I240 Acute coronary thrombosis not resulting in myocardial infarction: Secondary | ICD-10-CM

## 2022-03-21 DIAGNOSIS — Z7901 Long term (current) use of anticoagulants: Secondary | ICD-10-CM

## 2022-03-21 MED ORDER — HYDRALAZINE HCL 25 MG PO TABS
12.5000 mg | ORAL_TABLET | Freq: Two times a day (BID) | ORAL | 1 refills | Status: DC
Start: 2022-03-21 — End: 2022-10-31
  Filled 2022-03-21: qty 90, 90d supply, fill #0
  Filled 2022-06-29: qty 90, 90d supply, fill #1

## 2022-03-21 MED ORDER — WARFARIN SODIUM 10 MG PO TABS
10.0000 mg | ORAL_TABLET | Freq: Every day | ORAL | 0 refills | Status: DC
  Filled 2022-03-21: qty 30, 30d supply, fill #0
  Filled 2023-02-27: qty 30, 30d supply, fill #1

## 2022-03-21 NOTE — Telephone Encounter (Signed)
Prescription refill request received for warfarin Lov: 06/28/2021 Next INR check: 8/24 Warfarin tablet strength: 10mg 

## 2022-03-22 ENCOUNTER — Other Ambulatory Visit (HOSPITAL_BASED_OUTPATIENT_CLINIC_OR_DEPARTMENT_OTHER): Payer: Self-pay

## 2022-03-22 MED ORDER — ATORVASTATIN CALCIUM 80 MG PO TABS
ORAL_TABLET | Freq: Every day | ORAL | 3 refills | Status: DC
  Filled 2022-03-22: qty 90, 90d supply, fill #0
  Filled 2022-06-29: qty 90, 90d supply, fill #1
  Filled 2022-10-31: qty 90, 90d supply, fill #2
  Filled 2023-02-27: qty 90, 90d supply, fill #3

## 2022-04-21 ENCOUNTER — Ambulatory Visit (INDEPENDENT_AMBULATORY_CARE_PROVIDER_SITE_OTHER): Payer: 59

## 2022-04-21 DIAGNOSIS — I240 Acute coronary thrombosis not resulting in myocardial infarction: Secondary | ICD-10-CM

## 2022-04-21 DIAGNOSIS — Z5181 Encounter for therapeutic drug level monitoring: Secondary | ICD-10-CM

## 2022-04-21 DIAGNOSIS — I2699 Other pulmonary embolism without acute cor pulmonale: Secondary | ICD-10-CM

## 2022-04-21 DIAGNOSIS — D6859 Other primary thrombophilia: Secondary | ICD-10-CM

## 2022-04-21 DIAGNOSIS — Z7901 Long term (current) use of anticoagulants: Secondary | ICD-10-CM

## 2022-04-21 LAB — POCT INR: INR: 2.1 (ref 2.0–3.0)

## 2022-04-21 NOTE — Patient Instructions (Signed)
Continue taking warfarin 1/2 tablet daily except for 1 tablet on Tuesday and Thursday. Recheck INR in 6 weeks. Coumadin Clinic [phone]

## 2022-05-12 ENCOUNTER — Other Ambulatory Visit (HOSPITAL_BASED_OUTPATIENT_CLINIC_OR_DEPARTMENT_OTHER): Payer: Self-pay

## 2022-05-12 MED ORDER — WARFARIN SODIUM 10 MG PO TABS
10.0000 mg | ORAL_TABLET | Freq: Every day | ORAL | 5 refills | Status: DC
  Filled 2022-05-12: qty 30, 30d supply, fill #0
  Filled 2022-06-29: qty 30, 30d supply, fill #1
  Filled 2022-08-31: qty 30, 30d supply, fill #2
  Filled 2022-10-31: qty 30, 30d supply, fill #3
  Filled 2023-01-04: qty 30, 30d supply, fill #4
  Filled 2023-02-27 – 2023-04-17 (×2): qty 30, 30d supply, fill #5

## 2022-05-12 MED ORDER — GLYBURIDE 5 MG PO TABS
5.0000 mg | ORAL_TABLET | Freq: Two times a day (BID) | ORAL | 5 refills | Status: DC
  Filled 2022-05-12: qty 60, 30d supply, fill #0
  Filled 2022-06-29: qty 60, 30d supply, fill #1
  Filled 2022-10-31: qty 60, 30d supply, fill #2
  Filled 2023-01-04: qty 60, 30d supply, fill #3
  Filled 2023-02-27: qty 60, 30d supply, fill #4
  Filled 2023-04-17: qty 60, 30d supply, fill #5

## 2022-05-12 MED ORDER — GABAPENTIN 300 MG PO CAPS
300.0000 mg | ORAL_CAPSULE | Freq: Two times a day (BID) | ORAL | 5 refills | Status: DC
  Filled 2022-05-12: qty 60, 30d supply, fill #0
  Filled 2022-06-29: qty 60, 30d supply, fill #1
  Filled 2022-10-31: qty 60, 30d supply, fill #2
  Filled 2023-01-04: qty 60, 30d supply, fill #3
  Filled 2023-02-27: qty 60, 30d supply, fill #4
  Filled 2023-04-17: qty 60, 30d supply, fill #5

## 2022-06-02 ENCOUNTER — Ambulatory Visit: Payer: Self-pay | Attending: Internal Medicine

## 2022-06-02 DIAGNOSIS — Z5181 Encounter for therapeutic drug level monitoring: Secondary | ICD-10-CM

## 2022-06-02 DIAGNOSIS — D6859 Other primary thrombophilia: Secondary | ICD-10-CM

## 2022-06-02 DIAGNOSIS — Z7901 Long term (current) use of anticoagulants: Secondary | ICD-10-CM

## 2022-06-02 DIAGNOSIS — I240 Acute coronary thrombosis not resulting in myocardial infarction: Secondary | ICD-10-CM

## 2022-06-02 DIAGNOSIS — I2699 Other pulmonary embolism without acute cor pulmonale: Secondary | ICD-10-CM

## 2022-06-02 LAB — POCT INR: INR: 3.6 — AB (ref 2.0–3.0)

## 2022-06-02 NOTE — Patient Instructions (Signed)
HOLD TONIGHT ONLY and then Continue taking warfarin 1/2 tablet daily except for 1 tablet on Tuesday and Thursday. Recheck INR in 4 weeks. Coumadin Clinic [phone]

## 2022-06-29 ENCOUNTER — Ambulatory Visit: Payer: Self-pay | Attending: Cardiology | Admitting: *Deleted

## 2022-06-29 ENCOUNTER — Telehealth: Payer: Self-pay | Admitting: Licensed Clinical Social Worker

## 2022-06-29 ENCOUNTER — Other Ambulatory Visit (HOSPITAL_BASED_OUTPATIENT_CLINIC_OR_DEPARTMENT_OTHER): Payer: Self-pay

## 2022-06-29 DIAGNOSIS — D6859 Other primary thrombophilia: Secondary | ICD-10-CM

## 2022-06-29 DIAGNOSIS — I240 Acute coronary thrombosis not resulting in myocardial infarction: Secondary | ICD-10-CM

## 2022-06-29 DIAGNOSIS — Z7901 Long term (current) use of anticoagulants: Secondary | ICD-10-CM

## 2022-06-29 DIAGNOSIS — I2699 Other pulmonary embolism without acute cor pulmonale: Secondary | ICD-10-CM

## 2022-06-29 LAB — POCT INR: INR: 2.2 (ref 2.0–3.0)

## 2022-06-29 NOTE — Telephone Encounter (Signed)
H&V Care Navigation CSW Progress Note  Clinical Social Worker  mailed pt a Industrial/product designer, how to connect w/ a Social worker to enroll in [employer] and a CAFA  to f/u on  lack of insurance. Pt in past had not engaged with care navigation fully, may be interested in re-engaging. I remain available as needed.   Patient is participating in a Managed Medicaid Plan:  No, self pay only.   SDOH Screenings   Food Insecurity: Food Insecurity Present (11/06/2020)  Housing: Low Risk  (11/06/2020)  Transportation Needs: No Transportation Needs (11/06/2020)  Financial Resource Strain: High Risk (11/06/2020)  Tobacco Use: Low Risk  (06/28/2021)    Westley Hummer, MSW, LCSW Clinical Social Worker [place]  [phone]- work cell phone (preferred) [phone]- desk phone

## 2022-06-29 NOTE — Patient Instructions (Signed)
Description   Continue taking warfarin 1/2 tablet daily except for 1 tablet on Tuesday and Thursday. Recheck INR in 5 weeks. Coumadin Clinic [phone]

## 2022-08-03 ENCOUNTER — Ambulatory Visit: Payer: Self-pay | Attending: Cardiology

## 2022-08-03 DIAGNOSIS — I240 Acute coronary thrombosis not resulting in myocardial infarction: Secondary | ICD-10-CM

## 2022-08-03 DIAGNOSIS — Z7901 Long term (current) use of anticoagulants: Secondary | ICD-10-CM

## 2022-08-03 DIAGNOSIS — I2699 Other pulmonary embolism without acute cor pulmonale: Secondary | ICD-10-CM

## 2022-08-03 DIAGNOSIS — Z5181 Encounter for therapeutic drug level monitoring: Secondary | ICD-10-CM

## 2022-08-03 LAB — POCT INR: INR: 1.9 — AB (ref 2.0–3.0)

## 2022-08-03 NOTE — Patient Instructions (Signed)
Description   Take 1 tablet today and then continue taking warfarin 1/2 tablet daily except for 1 tablet on Tuesday and Thursday. Recheck INR in 5 weeks. Coumadin Clinic [phone]

## 2022-08-31 ENCOUNTER — Other Ambulatory Visit (HOSPITAL_BASED_OUTPATIENT_CLINIC_OR_DEPARTMENT_OTHER): Payer: Self-pay

## 2022-09-01 ENCOUNTER — Other Ambulatory Visit (HOSPITAL_BASED_OUTPATIENT_CLINIC_OR_DEPARTMENT_OTHER): Payer: Self-pay

## 2022-09-07 ENCOUNTER — Ambulatory Visit: Payer: Self-pay | Attending: Cardiology

## 2022-09-07 DIAGNOSIS — Z5181 Encounter for therapeutic drug level monitoring: Secondary | ICD-10-CM

## 2022-09-07 DIAGNOSIS — Z7901 Long term (current) use of anticoagulants: Secondary | ICD-10-CM

## 2022-09-07 DIAGNOSIS — I2699 Other pulmonary embolism without acute cor pulmonale: Secondary | ICD-10-CM

## 2022-09-07 DIAGNOSIS — I240 Acute coronary thrombosis not resulting in myocardial infarction: Secondary | ICD-10-CM

## 2022-09-07 LAB — POCT INR: INR: 2.3 (ref 2.0–3.0)

## 2022-09-07 NOTE — Patient Instructions (Signed)
Description   Continue taking warfarin 1/2 tablet daily except for 1 tablet on Tuesday and Thursday.  Recheck INR in 6 weeks. Coumadin Clinic [phone]     

## 2022-10-19 ENCOUNTER — Ambulatory Visit: Payer: Self-pay | Attending: Cardiology | Admitting: *Deleted

## 2022-10-19 DIAGNOSIS — Z7901 Long term (current) use of anticoagulants: Secondary | ICD-10-CM

## 2022-10-19 DIAGNOSIS — I240 Acute coronary thrombosis not resulting in myocardial infarction: Secondary | ICD-10-CM

## 2022-10-19 DIAGNOSIS — D6859 Other primary thrombophilia: Secondary | ICD-10-CM

## 2022-10-19 DIAGNOSIS — I2699 Other pulmonary embolism without acute cor pulmonale: Secondary | ICD-10-CM

## 2022-10-19 LAB — POCT INR: INR: 2.5 (ref 2.0–3.0)

## 2022-10-19 NOTE — Patient Instructions (Signed)
Description   Continue taking warfarin 1/2 tablet daily except for 1 tablet on Tuesday and Thursday.  Recheck INR in 6 weeks. Coumadin Clinic [phone]

## 2022-10-31 ENCOUNTER — Other Ambulatory Visit (HOSPITAL_BASED_OUTPATIENT_CLINIC_OR_DEPARTMENT_OTHER): Payer: Self-pay

## 2022-10-31 ENCOUNTER — Other Ambulatory Visit: Payer: Self-pay | Admitting: Physician Assistant

## 2022-11-01 ENCOUNTER — Other Ambulatory Visit (HOSPITAL_BASED_OUTPATIENT_CLINIC_OR_DEPARTMENT_OTHER): Payer: Self-pay

## 2022-11-01 ENCOUNTER — Other Ambulatory Visit: Payer: Self-pay

## 2022-11-01 MED ORDER — HYDRALAZINE HCL 25 MG PO TABS
12.5000 mg | ORAL_TABLET | Freq: Two times a day (BID) | ORAL | 1 refills | Status: DC
  Filled 2022-11-01: qty 90, 90d supply, fill #0
  Filled 2023-01-04 – 2023-02-27 (×2): qty 90, 90d supply, fill #1

## 2022-11-30 ENCOUNTER — Ambulatory Visit: Payer: Self-pay

## 2022-12-06 ENCOUNTER — Ambulatory Visit: Payer: PRIVATE HEALTH INSURANCE | Attending: Cardiovascular Disease | Admitting: *Deleted

## 2022-12-06 DIAGNOSIS — I2699 Other pulmonary embolism without acute cor pulmonale: Secondary | ICD-10-CM

## 2022-12-06 DIAGNOSIS — D6859 Other primary thrombophilia: Secondary | ICD-10-CM

## 2022-12-06 DIAGNOSIS — I240 Acute coronary thrombosis not resulting in myocardial infarction: Secondary | ICD-10-CM | POA: Diagnosis not present

## 2022-12-06 DIAGNOSIS — Z7901 Long term (current) use of anticoagulants: Secondary | ICD-10-CM

## 2022-12-06 LAB — POCT INR: INR: 1.2 — AB (ref 2.0–3.0)

## 2022-12-06 NOTE — Patient Instructions (Signed)
Description   Today take 1.5 tablets of warfarin and tomorrow take 1 tablet of warfarin then continue taking warfarin 1/2 tablet daily except for 1 tablet on Tuesday and Thursday.  Recheck INR in 1 week (normally 6 weeks). Coumadin Clinic [phone]

## 2022-12-13 ENCOUNTER — Ambulatory Visit: Payer: PRIVATE HEALTH INSURANCE | Attending: Cardiology | Admitting: *Deleted

## 2022-12-13 DIAGNOSIS — Z7901 Long term (current) use of anticoagulants: Secondary | ICD-10-CM | POA: Diagnosis not present

## 2022-12-13 DIAGNOSIS — D6859 Other primary thrombophilia: Secondary | ICD-10-CM | POA: Diagnosis not present

## 2022-12-13 DIAGNOSIS — I240 Acute coronary thrombosis not resulting in myocardial infarction: Secondary | ICD-10-CM | POA: Diagnosis not present

## 2022-12-13 DIAGNOSIS — I2699 Other pulmonary embolism without acute cor pulmonale: Secondary | ICD-10-CM

## 2022-12-13 LAB — POCT INR: INR: 2.5 (ref 2.0–3.0)

## 2022-12-13 NOTE — Patient Instructions (Signed)
Description   Continue taking warfarin 1/2 tablet daily except for 1 tablet on Tuesday and Thursday.  Recheck INR in 6 weeks. Coumadin Clinic [phone]     

## 2023-01-04 ENCOUNTER — Other Ambulatory Visit (HOSPITAL_BASED_OUTPATIENT_CLINIC_OR_DEPARTMENT_OTHER): Payer: Self-pay

## 2023-01-05 ENCOUNTER — Other Ambulatory Visit (HOSPITAL_BASED_OUTPATIENT_CLINIC_OR_DEPARTMENT_OTHER): Payer: Self-pay

## 2023-01-05 MED ORDER — METOPROLOL TARTRATE 25 MG PO TABS
25.0000 mg | ORAL_TABLET | Freq: Two times a day (BID) | ORAL | 4 refills | Status: DC
  Filled 2023-01-05: qty 60, 30d supply, fill #0
  Filled 2023-02-27: qty 180, 90d supply, fill #1

## 2023-01-25 ENCOUNTER — Ambulatory Visit: Payer: PRIVATE HEALTH INSURANCE | Attending: Internal Medicine

## 2023-01-25 DIAGNOSIS — I240 Acute coronary thrombosis not resulting in myocardial infarction: Secondary | ICD-10-CM

## 2023-01-25 DIAGNOSIS — I2699 Other pulmonary embolism without acute cor pulmonale: Secondary | ICD-10-CM | POA: Diagnosis not present

## 2023-01-25 DIAGNOSIS — Z7901 Long term (current) use of anticoagulants: Secondary | ICD-10-CM | POA: Diagnosis not present

## 2023-01-25 DIAGNOSIS — D6859 Other primary thrombophilia: Secondary | ICD-10-CM | POA: Diagnosis not present

## 2023-01-25 LAB — POCT INR: INR: 1.4 — AB (ref 2.0–3.0)

## 2023-01-25 NOTE — Patient Instructions (Signed)
Description   Take an extra half tablet of Warfarin today and tomorrow, then resume same dosage of warfarin 1/2 tablet daily except for 1 tablet on Tuesdays and Thursdays.  Recheck INR in 2 weeks. Coumadin Clinic [phone]

## 2023-02-08 ENCOUNTER — Ambulatory Visit: Payer: PRIVATE HEALTH INSURANCE | Attending: Internal Medicine

## 2023-02-08 DIAGNOSIS — I240 Acute coronary thrombosis not resulting in myocardial infarction: Secondary | ICD-10-CM | POA: Diagnosis not present

## 2023-02-08 DIAGNOSIS — Z7901 Long term (current) use of anticoagulants: Secondary | ICD-10-CM | POA: Diagnosis not present

## 2023-02-08 DIAGNOSIS — I2699 Other pulmonary embolism without acute cor pulmonale: Secondary | ICD-10-CM

## 2023-02-08 LAB — POCT INR: INR: 3 (ref 2.0–3.0)

## 2023-02-08 NOTE — Patient Instructions (Signed)
Description   Continue taking warfarin 1/2 tablet daily except for 1 tablet on Tuesdays and Thursdays.  Recheck INR in 3 weeks.  Coumadin Clinic [phone]

## 2023-02-27 ENCOUNTER — Other Ambulatory Visit (HOSPITAL_BASED_OUTPATIENT_CLINIC_OR_DEPARTMENT_OTHER): Payer: Self-pay

## 2023-02-27 MED ORDER — FENOFIBRATE 160 MG PO TABS
160.0000 mg | ORAL_TABLET | Freq: Every day | ORAL | 5 refills | Status: DC
  Filled 2023-02-27: qty 30, 30d supply, fill #0
  Filled 2023-04-17: qty 30, 30d supply, fill #1
  Filled 2023-06-01: qty 30, 30d supply, fill #2
  Filled 2023-08-28: qty 30, 30d supply, fill #3
  Filled 2023-11-22: qty 30, 30d supply, fill #4
  Filled 2024-02-19: qty 30, 30d supply, fill #5

## 2023-02-28 ENCOUNTER — Other Ambulatory Visit (HOSPITAL_BASED_OUTPATIENT_CLINIC_OR_DEPARTMENT_OTHER): Payer: Self-pay

## 2023-02-28 ENCOUNTER — Other Ambulatory Visit: Payer: Self-pay

## 2023-03-06 ENCOUNTER — Ambulatory Visit: Payer: PRIVATE HEALTH INSURANCE | Attending: Internal Medicine

## 2023-03-06 DIAGNOSIS — I2699 Other pulmonary embolism without acute cor pulmonale: Secondary | ICD-10-CM

## 2023-03-06 DIAGNOSIS — Z7901 Long term (current) use of anticoagulants: Secondary | ICD-10-CM | POA: Diagnosis not present

## 2023-03-06 DIAGNOSIS — D6859 Other primary thrombophilia: Secondary | ICD-10-CM | POA: Diagnosis not present

## 2023-03-06 DIAGNOSIS — I240 Acute coronary thrombosis not resulting in myocardial infarction: Secondary | ICD-10-CM | POA: Diagnosis not present

## 2023-03-06 LAB — POCT INR: INR: 3.9 — AB (ref 2.0–3.0)

## 2023-03-06 NOTE — Patient Instructions (Signed)
HOLD TONIGHT ONLY AND EAT GREENS THEN Continue taking warfarin 1/2 tablet daily except for 1 tablet on Tuesdays and Thursdays.  Recheck INR in 3 weeks.  Coumadin Clinic [phone]

## 2023-03-21 ENCOUNTER — Other Ambulatory Visit (HOSPITAL_BASED_OUTPATIENT_CLINIC_OR_DEPARTMENT_OTHER): Payer: Self-pay

## 2023-03-21 ENCOUNTER — Other Ambulatory Visit: Payer: Self-pay

## 2023-03-21 MED ORDER — TRAMADOL HCL ER 100 MG PO TB24
100.0000 mg | ORAL_TABLET | Freq: Every day | ORAL | 0 refills | Status: AC
  Filled 2023-03-21: qty 7, 7d supply, fill #0

## 2023-03-21 MED ORDER — PANTOPRAZOLE SODIUM 40 MG PO TBEC
40.0000 mg | DELAYED_RELEASE_TABLET | Freq: Every day | ORAL | 1 refills | Status: DC
  Filled 2023-03-21: qty 30, 30d supply, fill #0
  Filled 2023-04-17: qty 30, 30d supply, fill #1
  Filled 2023-06-01: qty 30, 30d supply, fill #2
  Filled 2023-08-28: qty 30, 30d supply, fill #3
  Filled 2023-11-22: qty 30, 30d supply, fill #4
  Filled 2024-02-19: qty 30, 30d supply, fill #5

## 2023-03-27 ENCOUNTER — Ambulatory Visit: Payer: PRIVATE HEALTH INSURANCE | Attending: Internal Medicine

## 2023-03-27 DIAGNOSIS — Z7901 Long term (current) use of anticoagulants: Secondary | ICD-10-CM | POA: Diagnosis not present

## 2023-03-27 DIAGNOSIS — I240 Acute coronary thrombosis not resulting in myocardial infarction: Secondary | ICD-10-CM | POA: Diagnosis not present

## 2023-03-27 DIAGNOSIS — D6859 Other primary thrombophilia: Secondary | ICD-10-CM

## 2023-03-27 DIAGNOSIS — I2699 Other pulmonary embolism without acute cor pulmonale: Secondary | ICD-10-CM | POA: Diagnosis not present

## 2023-03-27 LAB — POCT INR: INR: 2.4 (ref 2.0–3.0)

## 2023-03-27 NOTE — Patient Instructions (Signed)
Continue taking warfarin 1/2 tablet daily except for 1 tablet on Tuesdays and Thursdays.  Recheck INR in 5 weeks.  Coumadin Clinic [phone]

## 2023-04-17 ENCOUNTER — Other Ambulatory Visit (HOSPITAL_BASED_OUTPATIENT_CLINIC_OR_DEPARTMENT_OTHER): Payer: Self-pay

## 2023-05-04 ENCOUNTER — Ambulatory Visit: Payer: PRIVATE HEALTH INSURANCE | Attending: Internal Medicine | Admitting: *Deleted

## 2023-05-04 DIAGNOSIS — D6859 Other primary thrombophilia: Secondary | ICD-10-CM

## 2023-05-04 DIAGNOSIS — Z7901 Long term (current) use of anticoagulants: Secondary | ICD-10-CM

## 2023-05-04 DIAGNOSIS — I240 Acute coronary thrombosis not resulting in myocardial infarction: Secondary | ICD-10-CM

## 2023-05-04 DIAGNOSIS — I2699 Other pulmonary embolism without acute cor pulmonale: Secondary | ICD-10-CM | POA: Diagnosis not present

## 2023-05-04 LAB — POCT INR: INR: 3.5 — AB (ref 2.0–3.0)

## 2023-05-04 NOTE — Patient Instructions (Signed)
Description   Do not take any warfarin today then continue taking warfarin 1/2 tablet daily except for 1 tablet on Tuesdays and Thursdays.  Recheck INR in 4 weeks.  Coumadin Clinic [phone]

## 2023-05-31 ENCOUNTER — Ambulatory Visit: Payer: No Typology Code available for payment source | Attending: Cardiology | Admitting: *Deleted

## 2023-05-31 ENCOUNTER — Other Ambulatory Visit (HOSPITAL_BASED_OUTPATIENT_CLINIC_OR_DEPARTMENT_OTHER): Payer: Self-pay

## 2023-05-31 DIAGNOSIS — Z7901 Long term (current) use of anticoagulants: Secondary | ICD-10-CM

## 2023-05-31 DIAGNOSIS — D6859 Other primary thrombophilia: Secondary | ICD-10-CM | POA: Diagnosis not present

## 2023-05-31 DIAGNOSIS — I2699 Other pulmonary embolism without acute cor pulmonale: Secondary | ICD-10-CM

## 2023-05-31 DIAGNOSIS — I240 Acute coronary thrombosis not resulting in myocardial infarction: Secondary | ICD-10-CM | POA: Diagnosis not present

## 2023-05-31 LAB — POCT INR: INR: 4.5 — AB (ref 2.0–3.0)

## 2023-05-31 NOTE — Patient Instructions (Signed)
Description   Do not take any warfarin today and warfarin tomorrow then continue taking warfarin 1/2 tablet daily except for 1 tablet on Tuesdays and Thursdays.  Recheck INR in 3 weeks.  Coumadin Clinic [phone]

## 2023-06-01 ENCOUNTER — Other Ambulatory Visit (HOSPITAL_BASED_OUTPATIENT_CLINIC_OR_DEPARTMENT_OTHER): Payer: Self-pay

## 2023-06-01 ENCOUNTER — Other Ambulatory Visit: Payer: Self-pay | Admitting: Internal Medicine

## 2023-06-02 ENCOUNTER — Other Ambulatory Visit (HOSPITAL_BASED_OUTPATIENT_CLINIC_OR_DEPARTMENT_OTHER): Payer: Self-pay

## 2023-06-02 MED ORDER — METOPROLOL TARTRATE 25 MG PO TABS
25.0000 mg | ORAL_TABLET | Freq: Two times a day (BID) | ORAL | 4 refills | Status: DC
  Filled 2023-06-02: qty 60, 30d supply, fill #0
  Filled 2023-08-28: qty 60, 30d supply, fill #1
  Filled 2023-11-22: qty 60, 30d supply, fill #2
  Filled 2024-02-19: qty 60, 30d supply, fill #3

## 2023-06-02 MED ORDER — GABAPENTIN 300 MG PO CAPS
300.0000 mg | ORAL_CAPSULE | Freq: Two times a day (BID) | ORAL | 5 refills | Status: DC
  Filled 2023-06-02: qty 60, 30d supply, fill #0
  Filled 2023-08-28: qty 60, 30d supply, fill #1
  Filled 2023-11-22: qty 60, 30d supply, fill #2
  Filled 2024-02-19: qty 60, 30d supply, fill #3

## 2023-06-05 ENCOUNTER — Other Ambulatory Visit: Payer: Self-pay | Admitting: Internal Medicine

## 2023-06-05 ENCOUNTER — Other Ambulatory Visit (HOSPITAL_BASED_OUTPATIENT_CLINIC_OR_DEPARTMENT_OTHER): Payer: Self-pay

## 2023-06-05 ENCOUNTER — Telehealth: Payer: Self-pay | Admitting: Internal Medicine

## 2023-06-05 MED ORDER — GLYBURIDE 5 MG PO TABS
5.0000 mg | ORAL_TABLET | Freq: Two times a day (BID) | ORAL | 5 refills | Status: DC
  Filled 2023-06-05: qty 60, 30d supply, fill #0
  Filled 2023-08-28: qty 60, 30d supply, fill #1
  Filled 2023-11-22: qty 60, 30d supply, fill #2
  Filled 2024-02-19: qty 60, 30d supply, fill #3
  Filled 2024-06-04: qty 60, 30d supply, fill #4

## 2023-06-05 MED ORDER — WARFARIN SODIUM 10 MG PO TABS
10.0000 mg | ORAL_TABLET | Freq: Every day | ORAL | 5 refills | Status: DC
  Filled 2023-06-06: qty 30, 30d supply, fill #0

## 2023-06-05 NOTE — Telephone Encounter (Signed)
*  STAT* If patient is at the pharmacy, call can be transferred to refill team.   1. Which medications need to be refilled? (please list name of each medication and dose if known) warfarin (COUMADIN) 10 MG tablet   2. Which pharmacy/location (including street and city if local pharmacy) is medication to be sent to? [facility]   3. Do they need a 30 day or 90 day supply? 90

## 2023-06-06 ENCOUNTER — Other Ambulatory Visit: Payer: Self-pay

## 2023-06-06 ENCOUNTER — Other Ambulatory Visit (HOSPITAL_BASED_OUTPATIENT_CLINIC_OR_DEPARTMENT_OTHER): Payer: Self-pay

## 2023-06-06 MED ORDER — WARFARIN SODIUM 10 MG PO TABS
10.0000 mg | ORAL_TABLET | Freq: Every day | ORAL | 1 refills | Status: DC
  Filled 2023-06-06: qty 90, 90d supply, fill #0
  Filled 2023-08-28: qty 90, 90d supply, fill #1

## 2023-06-21 ENCOUNTER — Ambulatory Visit: Payer: No Typology Code available for payment source

## 2023-06-21 ENCOUNTER — Ambulatory Visit: Payer: No Typology Code available for payment source | Attending: Cardiovascular Disease | Admitting: *Deleted

## 2023-06-21 DIAGNOSIS — I240 Acute coronary thrombosis not resulting in myocardial infarction: Secondary | ICD-10-CM

## 2023-06-21 DIAGNOSIS — Z7901 Long term (current) use of anticoagulants: Secondary | ICD-10-CM | POA: Diagnosis not present

## 2023-06-21 DIAGNOSIS — D6859 Other primary thrombophilia: Secondary | ICD-10-CM

## 2023-06-21 DIAGNOSIS — I2699 Other pulmonary embolism without acute cor pulmonale: Secondary | ICD-10-CM

## 2023-06-21 LAB — POCT INR: INR: 2.4 (ref 2.0–3.0)

## 2023-06-21 NOTE — Patient Instructions (Signed)
Description   Continue taking warfarin 1/2 tablet daily except for 1 tablet on Tuesdays and Thursdays.  Recheck INR in 4 weeks. Coumadin Clinic [phone]

## 2023-07-17 ENCOUNTER — Encounter: Payer: Self-pay | Admitting: Internal Medicine

## 2023-07-17 ENCOUNTER — Ambulatory Visit: Payer: No Typology Code available for payment source | Attending: Internal Medicine | Admitting: Internal Medicine

## 2023-07-17 VITALS — BP 108/68 | HR 89 | Ht 71.0 in | Wt 315.8 lb

## 2023-07-17 DIAGNOSIS — Z136 Encounter for screening for cardiovascular disorders: Secondary | ICD-10-CM

## 2023-07-17 DIAGNOSIS — Z7901 Long term (current) use of anticoagulants: Secondary | ICD-10-CM | POA: Diagnosis not present

## 2023-07-17 DIAGNOSIS — Z86711 Personal history of pulmonary embolism: Secondary | ICD-10-CM | POA: Diagnosis not present

## 2023-07-17 DIAGNOSIS — I25810 Atherosclerosis of coronary artery bypass graft(s) without angina pectoris: Secondary | ICD-10-CM | POA: Diagnosis not present

## 2023-07-17 DIAGNOSIS — Z794 Long term (current) use of insulin: Secondary | ICD-10-CM

## 2023-07-17 DIAGNOSIS — E119 Type 2 diabetes mellitus without complications: Secondary | ICD-10-CM

## 2023-07-17 NOTE — Patient Instructions (Signed)
Medication Instructions:  No changes *If you need a refill on your cardiac medications before your next appointment, please call your pharmacy*     Follow-Up: At [facility], you and your health needs are our priority.  As part of our continuing mission to provide you with exceptional heart care, we have created designated Provider Care Teams.  These Care Teams include your primary Cardiologist (physician) and Advanced Practice Providers (APPs -  Physician Assistants and Nurse Practitioners) who all work together to provide you with the care you need, when you need it.  We recommend signing up for the patient portal called "MyChart".  Sign up information is provided on this After Visit Summary.  MyChart is used to connect with patients for Virtual Visits (Telemedicine).  Patients are able to view lab/test results, encounter notes, upcoming appointments, etc.  Non-urgent messages can be sent to your provider as well.   To learn more about what you can do with MyChart, go to [email or web address].    Your next appointment:   12 month(s)  Provider:   Zoila Shutter MD
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
## 2023-07-19 ENCOUNTER — Ambulatory Visit: Payer: No Typology Code available for payment source | Attending: Cardiovascular Disease

## 2023-07-19 DIAGNOSIS — I240 Acute coronary thrombosis not resulting in myocardial infarction: Secondary | ICD-10-CM | POA: Diagnosis not present

## 2023-07-19 DIAGNOSIS — I2699 Other pulmonary embolism without acute cor pulmonale: Secondary | ICD-10-CM | POA: Diagnosis not present

## 2023-07-19 DIAGNOSIS — Z7901 Long term (current) use of anticoagulants: Secondary | ICD-10-CM

## 2023-07-19 LAB — POCT INR: INR: 3.7 — AB (ref 2.0–3.0)

## 2023-07-19 NOTE — Patient Instructions (Signed)
Description   HOLD today's dose and then continue taking warfarin 1/2 tablet daily except for 1 tablet on Tuesdays and Thursdays.  Stays consistent with greens each week  Recheck INR in 2 weeks.  Coumadin Clinic [phone]

## 2023-08-02 ENCOUNTER — Ambulatory Visit: Payer: No Typology Code available for payment source | Attending: Cardiovascular Disease

## 2023-08-02 DIAGNOSIS — I2699 Other pulmonary embolism without acute cor pulmonale: Secondary | ICD-10-CM | POA: Diagnosis not present

## 2023-08-02 DIAGNOSIS — I240 Acute coronary thrombosis not resulting in myocardial infarction: Secondary | ICD-10-CM

## 2023-08-02 DIAGNOSIS — Z7901 Long term (current) use of anticoagulants: Secondary | ICD-10-CM | POA: Diagnosis not present

## 2023-08-02 LAB — POCT INR: INR: 5 — AB (ref 2.0–3.0)

## 2023-08-02 NOTE — Patient Instructions (Signed)
Description   HOLD today's dose and tomorrow's dose and then START taking warfarin 1/2 tablet daily except for 1 tablet on Thursdays.  Stays consistent with greens each week  Recheck INR in 1 week.  Coumadin Clinic [phone]

## 2023-08-09 ENCOUNTER — Other Ambulatory Visit (HOSPITAL_BASED_OUTPATIENT_CLINIC_OR_DEPARTMENT_OTHER): Payer: Self-pay

## 2023-08-10 ENCOUNTER — Ambulatory Visit: Payer: No Typology Code available for payment source | Attending: Cardiovascular Disease | Admitting: *Deleted

## 2023-08-10 DIAGNOSIS — D6859 Other primary thrombophilia: Secondary | ICD-10-CM | POA: Diagnosis not present

## 2023-08-10 DIAGNOSIS — I2699 Other pulmonary embolism without acute cor pulmonale: Secondary | ICD-10-CM

## 2023-08-10 DIAGNOSIS — Z7901 Long term (current) use of anticoagulants: Secondary | ICD-10-CM | POA: Diagnosis not present

## 2023-08-10 DIAGNOSIS — I240 Acute coronary thrombosis not resulting in myocardial infarction: Secondary | ICD-10-CM | POA: Diagnosis not present

## 2023-08-10 LAB — POCT INR: INR: 2.4 (ref 2.0–3.0)

## 2023-08-10 NOTE — Patient Instructions (Addendum)
Description   Continue taking warfarin 1/2 tablet daily except for 1 tablet on Thursdays.  Stays consistent with greens each week  Recheck INR in 4 weeks.  Coumadin Clinic [phone]

## 2023-08-28 ENCOUNTER — Other Ambulatory Visit: Payer: Self-pay | Admitting: Internal Medicine

## 2023-08-28 ENCOUNTER — Other Ambulatory Visit (HOSPITAL_BASED_OUTPATIENT_CLINIC_OR_DEPARTMENT_OTHER): Payer: Self-pay

## 2023-08-28 MED ORDER — HYDRALAZINE HCL 25 MG PO TABS
12.5000 mg | ORAL_TABLET | Freq: Two times a day (BID) | ORAL | 3 refills | Status: AC
  Filled 2023-08-28: qty 90, 90d supply, fill #0
  Filled 2023-11-22: qty 90, 90d supply, fill #1
  Filled 2024-02-19: qty 90, 90d supply, fill #2
  Filled 2024-06-04: qty 90, 90d supply, fill #3

## 2023-08-28 MED ORDER — ATORVASTATIN CALCIUM 80 MG PO TABS
80.0000 mg | ORAL_TABLET | Freq: Every day | ORAL | 3 refills | Status: AC
  Filled 2023-08-28: qty 90, 90d supply, fill #0
  Filled 2023-11-22: qty 90, 90d supply, fill #1
  Filled 2024-02-19: qty 90, 90d supply, fill #2
  Filled 2024-06-04: qty 90, 90d supply, fill #3

## 2023-09-06 ENCOUNTER — Ambulatory Visit: Payer: No Typology Code available for payment source | Attending: Internal Medicine | Admitting: *Deleted

## 2023-09-06 DIAGNOSIS — Z7901 Long term (current) use of anticoagulants: Secondary | ICD-10-CM

## 2023-09-06 DIAGNOSIS — I240 Acute coronary thrombosis not resulting in myocardial infarction: Secondary | ICD-10-CM | POA: Diagnosis not present

## 2023-09-06 DIAGNOSIS — D6859 Other primary thrombophilia: Secondary | ICD-10-CM | POA: Diagnosis not present

## 2023-09-06 DIAGNOSIS — I2699 Other pulmonary embolism without acute cor pulmonale: Secondary | ICD-10-CM

## 2023-09-06 LAB — POCT INR: INR: 4.3 — AB (ref 2.0–3.0)

## 2023-09-06 NOTE — Patient Instructions (Signed)
 Description   Do not take any warfarin today and no warfarin tomorrow then continue taking warfarin 1/2 tablet daily except for 1 tablet on Thursdays.  Stays consistent with greens each week  Recheck INR in 3 weeks.  Coumadin Clinic [phone]

## 2023-09-27 ENCOUNTER — Ambulatory Visit: Payer: No Typology Code available for payment source | Attending: Internal Medicine

## 2023-09-27 DIAGNOSIS — Z7901 Long term (current) use of anticoagulants: Secondary | ICD-10-CM

## 2023-09-27 DIAGNOSIS — I240 Acute coronary thrombosis not resulting in myocardial infarction: Secondary | ICD-10-CM | POA: Diagnosis not present

## 2023-09-27 DIAGNOSIS — I2699 Other pulmonary embolism without acute cor pulmonale: Secondary | ICD-10-CM | POA: Diagnosis not present

## 2023-09-27 LAB — POCT INR: INR: 1.9 — AB (ref 2.0–3.0)

## 2023-09-27 NOTE — Patient Instructions (Signed)
Description   Take 1 tablet today and then continue taking warfarin 1/2 tablet daily except for 1 tablet on Thursdays.  Stays consistent with greens each week  Recheck INR in 3 weeks.  Coumadin Clinic [phone]

## 2023-10-18 ENCOUNTER — Ambulatory Visit: Payer: No Typology Code available for payment source

## 2023-10-25 ENCOUNTER — Ambulatory Visit: Payer: No Typology Code available for payment source | Attending: Cardiovascular Disease

## 2023-10-25 DIAGNOSIS — I240 Acute coronary thrombosis not resulting in myocardial infarction: Secondary | ICD-10-CM | POA: Diagnosis not present

## 2023-10-25 DIAGNOSIS — Z7901 Long term (current) use of anticoagulants: Secondary | ICD-10-CM | POA: Diagnosis not present

## 2023-10-25 DIAGNOSIS — I2699 Other pulmonary embolism without acute cor pulmonale: Secondary | ICD-10-CM

## 2023-10-25 LAB — POCT INR: INR: 2.4 (ref 2.0–3.0)

## 2023-10-25 NOTE — Patient Instructions (Signed)
 Description   Continue taking warfarin 1/2 tablet daily except for 1 tablet on Thursdays.  Stays consistent with greens each week  Recheck INR in 4 weeks.  Coumadin Clinic [phone]

## 2023-11-22 ENCOUNTER — Ambulatory Visit: Payer: No Typology Code available for payment source | Attending: Internal Medicine | Admitting: *Deleted

## 2023-11-22 ENCOUNTER — Other Ambulatory Visit (HOSPITAL_BASED_OUTPATIENT_CLINIC_OR_DEPARTMENT_OTHER): Payer: Self-pay

## 2023-11-22 ENCOUNTER — Other Ambulatory Visit: Payer: Self-pay | Admitting: Internal Medicine

## 2023-11-22 DIAGNOSIS — I240 Acute coronary thrombosis not resulting in myocardial infarction: Secondary | ICD-10-CM

## 2023-11-22 DIAGNOSIS — I2699 Other pulmonary embolism without acute cor pulmonale: Secondary | ICD-10-CM

## 2023-11-22 DIAGNOSIS — Z7901 Long term (current) use of anticoagulants: Secondary | ICD-10-CM

## 2023-11-22 DIAGNOSIS — D6859 Other primary thrombophilia: Secondary | ICD-10-CM

## 2023-11-22 LAB — POCT INR: INR: 3 (ref 2.0–3.0)

## 2023-11-22 MED ORDER — WARFARIN SODIUM 10 MG PO TABS
ORAL_TABLET | ORAL | 0 refills | Status: DC
  Filled 2023-11-22: qty 60, fill #0

## 2023-11-22 NOTE — Patient Instructions (Signed)
 Description   Continue taking warfarin 1/2 tablet daily except for 1 tablet on Thursdays.  Stays consistent with greens each week  Recheck INR in 5 weeks.  Coumadin Clinic [phone]

## 2023-11-22 NOTE — Telephone Encounter (Signed)
 Prescription refill request received for warfarin [facility]: 07/17/23 ([place])  Next INR check:  12/27/23 Warfarin tablet strength: 10mg   Appropriate dose. Refill sent.

## 2023-12-26 ENCOUNTER — Telehealth: Payer: Self-pay | Admitting: *Deleted

## 2023-12-26 NOTE — Telephone Encounter (Signed)
 Called pt to remind him that his Anticoagulation appt tomorrow is at the new location: [address] and to call back if any questions. NO VOICEMAIL SO UNABLE TO LEAVE THE MESSAGE.

## 2023-12-27 ENCOUNTER — Ambulatory Visit: Attending: Internal Medicine

## 2023-12-27 DIAGNOSIS — Z7901 Long term (current) use of anticoagulants: Secondary | ICD-10-CM

## 2023-12-27 DIAGNOSIS — I240 Acute coronary thrombosis not resulting in myocardial infarction: Secondary | ICD-10-CM | POA: Diagnosis not present

## 2023-12-27 DIAGNOSIS — I2699 Other pulmonary embolism without acute cor pulmonale: Secondary | ICD-10-CM | POA: Diagnosis not present

## 2023-12-27 LAB — POCT INR: INR: 4.9 — AB (ref 2.0–3.0)

## 2023-12-27 NOTE — Patient Instructions (Signed)
 Description   HOLD today's dose and tomorrow's dose and then continue taking warfarin 1/2 tablet daily except for 1 tablet on Thursdays.  Stays consistent with greens each week  Recheck INR in 2 weeks.  Coumadin  Clinic [phone]

## 2024-01-11 ENCOUNTER — Ambulatory Visit

## 2024-01-31 ENCOUNTER — Ambulatory Visit: Attending: Internal Medicine | Admitting: *Deleted

## 2024-01-31 DIAGNOSIS — I240 Acute coronary thrombosis not resulting in myocardial infarction: Secondary | ICD-10-CM | POA: Diagnosis not present

## 2024-01-31 DIAGNOSIS — I2699 Other pulmonary embolism without acute cor pulmonale: Secondary | ICD-10-CM

## 2024-01-31 DIAGNOSIS — Z7901 Long term (current) use of anticoagulants: Secondary | ICD-10-CM | POA: Diagnosis not present

## 2024-01-31 DIAGNOSIS — D6859 Other primary thrombophilia: Secondary | ICD-10-CM

## 2024-01-31 LAB — POCT INR: INR: 2.5 (ref 2.0–3.0)

## 2024-01-31 NOTE — Patient Instructions (Signed)
 Description   Continue taking warfarin 1/2 tablet daily except for 1 tablet on Thursdays.  Stays consistent with greens each week  Recheck INR in 4 weeks.  Coumadin Clinic [phone]

## 2024-02-19 ENCOUNTER — Other Ambulatory Visit (HOSPITAL_BASED_OUTPATIENT_CLINIC_OR_DEPARTMENT_OTHER): Payer: Self-pay

## 2024-02-27 NOTE — Progress Notes (Signed)
 Anticoag [place]

## 2024-02-28 ENCOUNTER — Ambulatory Visit: Attending: Internal Medicine | Admitting: *Deleted

## 2024-02-28 DIAGNOSIS — I240 Acute coronary thrombosis not resulting in myocardial infarction: Secondary | ICD-10-CM | POA: Diagnosis not present

## 2024-02-28 DIAGNOSIS — I2699 Other pulmonary embolism without acute cor pulmonale: Secondary | ICD-10-CM

## 2024-02-28 DIAGNOSIS — Z7901 Long term (current) use of anticoagulants: Secondary | ICD-10-CM | POA: Diagnosis not present

## 2024-02-28 DIAGNOSIS — D6859 Other primary thrombophilia: Secondary | ICD-10-CM

## 2024-02-28 LAB — POCT INR: INR: 2.4 (ref 2.0–3.0)

## 2024-02-28 NOTE — Progress Notes (Signed)
Please see anticoagulation encounter.

## 2024-02-28 NOTE — Patient Instructions (Signed)
 Description   Continue taking warfarin 1/2 tablet daily except for 1 tablet on Thursdays.  Stays consistent with greens each week  Recheck INR in 5 weeks.  Coumadin Clinic [phone]

## 2024-03-13 ENCOUNTER — Other Ambulatory Visit: Payer: Self-pay | Admitting: Internal Medicine

## 2024-03-13 ENCOUNTER — Other Ambulatory Visit (HOSPITAL_BASED_OUTPATIENT_CLINIC_OR_DEPARTMENT_OTHER): Payer: Self-pay

## 2024-03-13 DIAGNOSIS — Z7901 Long term (current) use of anticoagulants: Secondary | ICD-10-CM

## 2024-03-13 DIAGNOSIS — I240 Acute coronary thrombosis not resulting in myocardial infarction: Secondary | ICD-10-CM

## 2024-03-13 MED ORDER — WARFARIN SODIUM 10 MG PO TABS
10.0000 mg | ORAL_TABLET | Freq: Every day | ORAL | 1 refills | Status: AC
Start: 2024-03-13 — End: ?
  Filled 2024-03-13: qty 75, 75d supply, fill #0
  Filled 2024-06-04: qty 75, 90d supply, fill #1

## 2024-03-13 NOTE — Telephone Encounter (Signed)
 Warfarin 10mg  refill Left ventricular mural thrombus without myocardial infarction   Pulmonary embolism  Last INR 02/28/24 Last OV 07/17/23

## 2024-04-03 ENCOUNTER — Ambulatory Visit: Attending: Internal Medicine | Admitting: *Deleted

## 2024-04-03 DIAGNOSIS — D6859 Other primary thrombophilia: Secondary | ICD-10-CM | POA: Diagnosis not present

## 2024-04-03 DIAGNOSIS — Z7901 Long term (current) use of anticoagulants: Secondary | ICD-10-CM

## 2024-04-03 DIAGNOSIS — I2699 Other pulmonary embolism without acute cor pulmonale: Secondary | ICD-10-CM | POA: Diagnosis not present

## 2024-04-03 DIAGNOSIS — I240 Acute coronary thrombosis not resulting in myocardial infarction: Secondary | ICD-10-CM | POA: Diagnosis not present

## 2024-04-03 LAB — POCT INR: INR: 1.7 — AB (ref 2.0–3.0)

## 2024-04-03 NOTE — Progress Notes (Signed)
 INR 1.7  Please see anticoagulation encounter

## 2024-04-03 NOTE — Patient Instructions (Signed)
 Description   Today take 1 tablet of warfarin then continue taking warfarin 1/2 tablet daily except for 1 tablet on Thursdays.  Stays consistent with greens each week  Recheck INR in 5 weeks.  Coumadin  Clinic [phone]

## 2024-05-08 ENCOUNTER — Ambulatory Visit: Attending: Internal Medicine | Admitting: Pharmacist

## 2024-05-08 DIAGNOSIS — Z7901 Long term (current) use of anticoagulants: Secondary | ICD-10-CM

## 2024-05-08 DIAGNOSIS — I2699 Other pulmonary embolism without acute cor pulmonale: Secondary | ICD-10-CM

## 2024-05-08 DIAGNOSIS — D6859 Other primary thrombophilia: Secondary | ICD-10-CM | POA: Diagnosis not present

## 2024-05-08 DIAGNOSIS — I240 Acute coronary thrombosis not resulting in myocardial infarction: Secondary | ICD-10-CM

## 2024-05-08 LAB — POCT INR: INR: 4.3 — AB (ref 2.0–3.0)

## 2024-05-08 NOTE — Progress Notes (Signed)
 INR 4.3; Please see anticoagulation encounter   Description   Hold dose today and then continue taking warfarin 1/2 tablet daily except for 1 tablet on Thursdays.  Stays consistent with greens each week  Please call if you want to adjust your dose Recheck INR in 3 weeks.  Coumadin  Clinic [phone]

## 2024-05-08 NOTE — Patient Instructions (Signed)
 Description   Hold dose today and then continue taking warfarin 1/2 tablet daily except for 1 tablet on Thursdays.  Stays consistent with greens each week  Please call if you want to adjust your dose Recheck INR in 3 weeks.  Coumadin  Clinic [phone]

## 2024-05-29 ENCOUNTER — Ambulatory Visit: Attending: Cardiovascular Disease | Admitting: *Deleted

## 2024-05-29 DIAGNOSIS — I240 Acute coronary thrombosis not resulting in myocardial infarction: Secondary | ICD-10-CM | POA: Diagnosis not present

## 2024-05-29 DIAGNOSIS — D6859 Other primary thrombophilia: Secondary | ICD-10-CM

## 2024-05-29 DIAGNOSIS — Z7901 Long term (current) use of anticoagulants: Secondary | ICD-10-CM

## 2024-05-29 DIAGNOSIS — I2699 Other pulmonary embolism without acute cor pulmonale: Secondary | ICD-10-CM | POA: Diagnosis not present

## 2024-05-29 LAB — POCT INR: INR: 1.9 — AB (ref 2.0–3.0)

## 2024-05-29 NOTE — Progress Notes (Signed)
 Description   INR-1.9; Today take 1 tablet of warfarin then continue taking warfarin 1/2 tablet daily except for 1 tablet on Thursdays.  Stays consistent with greens each week. Please call if you want to adjust your dose. Recheck INR in 3 weeks. Coumadin  Clinic [phone]

## 2024-05-29 NOTE — Patient Instructions (Signed)
 Description   INR-1.9; Today take 1 tablet of warfarin then continue taking warfarin 1/2 tablet daily except for 1 tablet on Thursdays.  Stays consistent with greens each week. Please call if you want to adjust your dose. Recheck INR in 3 weeks. Coumadin  Clinic [phone]

## 2024-06-04 ENCOUNTER — Other Ambulatory Visit: Payer: Self-pay

## 2024-06-05 ENCOUNTER — Other Ambulatory Visit (HOSPITAL_BASED_OUTPATIENT_CLINIC_OR_DEPARTMENT_OTHER): Payer: Self-pay

## 2024-06-05 MED ORDER — METOPROLOL TARTRATE 25 MG PO TABS
25.0000 mg | ORAL_TABLET | Freq: Two times a day (BID) | ORAL | 4 refills | Status: AC
  Filled 2024-06-05: qty 60, 30d supply, fill #0
  Filled 2024-08-14: qty 180, 90d supply, fill #1

## 2024-06-05 MED ORDER — GABAPENTIN 300 MG PO CAPS
300.0000 mg | ORAL_CAPSULE | Freq: Two times a day (BID) | ORAL | 5 refills | Status: AC
  Filled 2024-06-05: qty 60, 30d supply, fill #0
  Filled 2024-08-14: qty 60, 30d supply, fill #1

## 2024-06-05 MED ORDER — FENOFIBRATE 160 MG PO TABS
160.0000 mg | ORAL_TABLET | Freq: Every day | ORAL | 5 refills | Status: AC
  Filled 2024-06-05: qty 30, 30d supply, fill #0
  Filled 2024-08-14: qty 90, 90d supply, fill #1

## 2024-06-07 ENCOUNTER — Other Ambulatory Visit (HOSPITAL_BASED_OUTPATIENT_CLINIC_OR_DEPARTMENT_OTHER): Payer: Self-pay

## 2024-06-19 ENCOUNTER — Ambulatory Visit: Attending: Cardiovascular Disease | Admitting: *Deleted

## 2024-06-19 DIAGNOSIS — I2699 Other pulmonary embolism without acute cor pulmonale: Secondary | ICD-10-CM | POA: Diagnosis not present

## 2024-06-19 DIAGNOSIS — D6859 Other primary thrombophilia: Secondary | ICD-10-CM | POA: Diagnosis not present

## 2024-06-19 DIAGNOSIS — Z7901 Long term (current) use of anticoagulants: Secondary | ICD-10-CM | POA: Diagnosis not present

## 2024-06-19 DIAGNOSIS — I240 Acute coronary thrombosis not resulting in myocardial infarction: Secondary | ICD-10-CM

## 2024-06-19 LAB — POCT INR: INR: 1.4 — AB (ref 2.0–3.0)

## 2024-06-19 NOTE — Patient Instructions (Signed)
 Description   INR-1.4; Today take 1 tablet of warfarin and tomorrow take 1.5 tablets of warfarin then continue taking warfarin 1/2 tablet daily except for 1 tablet on Thursdays.  Stays consistent with greens each week. Please call if you want to adjust your dose. Recheck INR in 1 week. Coumadin  Clinic [phone]

## 2024-06-19 NOTE — Progress Notes (Signed)
 Description   INR-1.4; Today take 1 tablet of warfarin and tomorrow take 1.5 tablets of warfarin then continue taking warfarin 1/2 tablet daily except for 1 tablet on Thursdays.  Stays consistent with greens each week. Please call if you want to adjust your dose. Recheck INR in 1 week. Coumadin  Clinic [phone]

## 2024-06-26 ENCOUNTER — Ambulatory Visit: Attending: Cardiovascular Disease | Admitting: *Deleted

## 2024-06-26 DIAGNOSIS — D6859 Other primary thrombophilia: Secondary | ICD-10-CM

## 2024-06-26 DIAGNOSIS — I240 Acute coronary thrombosis not resulting in myocardial infarction: Secondary | ICD-10-CM | POA: Diagnosis not present

## 2024-06-26 DIAGNOSIS — Z7901 Long term (current) use of anticoagulants: Secondary | ICD-10-CM | POA: Diagnosis not present

## 2024-06-26 DIAGNOSIS — I2699 Other pulmonary embolism without acute cor pulmonale: Secondary | ICD-10-CM | POA: Diagnosis not present

## 2024-06-26 LAB — POCT INR: INR: 2.3 (ref 2.0–3.0)

## 2024-06-26 NOTE — Patient Instructions (Signed)
 Description   INR-2.3; Continue taking warfarin 1/2 tablet daily except for 1 tablet on Thursdays.  Stays consistent with greens each week. Please call if you want to adjust your dose. Recheck INR in 3 weeks. Coumadin  Clinic [phone]

## 2024-06-26 NOTE — Progress Notes (Signed)
 Description   INR-2.3; Continue taking warfarin 1/2 tablet daily except for 1 tablet on Thursdays.  Stays consistent with greens each week. Please call if you want to adjust your dose. Recheck INR in 3 weeks. Coumadin  Clinic [phone]

## 2024-07-17 ENCOUNTER — Ambulatory Visit: Attending: Cardiovascular Disease | Admitting: Pharmacist

## 2024-07-17 DIAGNOSIS — Z7901 Long term (current) use of anticoagulants: Secondary | ICD-10-CM

## 2024-07-17 DIAGNOSIS — I240 Acute coronary thrombosis not resulting in myocardial infarction: Secondary | ICD-10-CM

## 2024-07-17 DIAGNOSIS — D6859 Other primary thrombophilia: Secondary | ICD-10-CM | POA: Diagnosis not present

## 2024-07-17 DIAGNOSIS — I2699 Other pulmonary embolism without acute cor pulmonale: Secondary | ICD-10-CM

## 2024-07-17 LAB — POCT INR: INR: 2.8 (ref 2.0–3.0)

## 2024-07-17 NOTE — Progress Notes (Signed)
 Description   INR-2.8; Continue taking warfarin 1/2 tablet daily except for 1 tablet on Thursdays.  Stays consistent with greens each week. Please call if you want to adjust your dose. Recheck INR in 4 weeks. Coumadin  Clinic [phone]

## 2024-07-17 NOTE — Patient Instructions (Signed)
 Description   INR-2.8; Continue taking warfarin 1/2 tablet daily except for 1 tablet on Thursdays.  Stays consistent with greens each week. Please call if you want to adjust your dose. Recheck INR in 4 weeks. Coumadin  Clinic [phone]

## 2024-08-14 ENCOUNTER — Ambulatory Visit: Attending: Cardiovascular Disease

## 2024-08-14 ENCOUNTER — Other Ambulatory Visit (HOSPITAL_BASED_OUTPATIENT_CLINIC_OR_DEPARTMENT_OTHER): Payer: Self-pay

## 2024-08-14 DIAGNOSIS — D6859 Other primary thrombophilia: Secondary | ICD-10-CM | POA: Diagnosis present

## 2024-08-14 DIAGNOSIS — Z7901 Long term (current) use of anticoagulants: Secondary | ICD-10-CM | POA: Insufficient documentation

## 2024-08-14 DIAGNOSIS — I240 Acute coronary thrombosis not resulting in myocardial infarction: Secondary | ICD-10-CM | POA: Insufficient documentation

## 2024-08-14 DIAGNOSIS — I2699 Other pulmonary embolism without acute cor pulmonale: Secondary | ICD-10-CM | POA: Insufficient documentation

## 2024-08-14 LAB — POCT INR: INR: 1.8 — AB (ref 2.0–3.0)

## 2024-08-14 MED ORDER — GLYBURIDE 5 MG PO TABS
5.0000 mg | ORAL_TABLET | Freq: Two times a day (BID) | ORAL | 0 refills | Status: AC
  Filled 2024-08-14: qty 60, 30d supply, fill #0

## 2024-08-14 MED ORDER — PANTOPRAZOLE SODIUM 40 MG PO TBEC
40.0000 mg | DELAYED_RELEASE_TABLET | Freq: Every day | ORAL | 1 refills | Status: AC
  Filled 2024-08-14: qty 30, 30d supply, fill #0

## 2024-08-14 NOTE — Patient Instructions (Signed)
 Description   INR-1.8; Today take 1 tablet of warfarin then continue taking warfarin 1/2 tablet daily except for 1 tablet on Thursdays.  Stays consistent with greens each week. Please call if you want to adjust your dose. Recheck INR in 4 weeks. Coumadin  Clinic [phone]

## 2024-08-14 NOTE — Progress Notes (Signed)
 Description   INR-1.8; Today take 1 tablet of warfarin then continue taking warfarin 1/2 tablet daily except for 1 tablet on Thursdays.  Stays consistent with greens each week. Please call if you want to adjust your dose. Recheck INR in 4 weeks. Coumadin  Clinic [phone]

## 2024-09-11 ENCOUNTER — Ambulatory Visit

## 2024-09-11 ENCOUNTER — Ambulatory Visit: Admitting: *Deleted

## 2024-09-11 DIAGNOSIS — Z7901 Long term (current) use of anticoagulants: Secondary | ICD-10-CM | POA: Diagnosis present

## 2024-09-11 DIAGNOSIS — I240 Acute coronary thrombosis not resulting in myocardial infarction: Secondary | ICD-10-CM | POA: Insufficient documentation

## 2024-09-11 DIAGNOSIS — I2699 Other pulmonary embolism without acute cor pulmonale: Secondary | ICD-10-CM | POA: Diagnosis present

## 2024-09-11 DIAGNOSIS — D6859 Other primary thrombophilia: Secondary | ICD-10-CM | POA: Diagnosis present

## 2024-09-11 LAB — POCT INR: INR: 3 (ref 2.0–3.0)

## 2024-09-11 NOTE — Progress Notes (Signed)
 Description   NEEDS OV VISIT WITH CARDIOLOGIST  INR-3.0; Continue taking warfarin 1/2 tablet daily except for 1 tablet on Thursdays.  Stays consistent with greens each week. Please call if you want to adjust your dose. Recheck INR in 4 weeks. Coumadin  Clinic [phone]

## 2024-09-11 NOTE — Patient Instructions (Signed)
 Description   NEEDS OV VISIT WITH CARDIOLOGIST  INR-3.0; Continue taking warfarin 1/2 tablet daily except for 1 tablet on Thursdays.  Stays consistent with greens each week. Please call if you want to adjust your dose. Recheck INR in 4 weeks. Coumadin  Clinic [phone]

## 2024-09-26 ENCOUNTER — Other Ambulatory Visit (HOSPITAL_BASED_OUTPATIENT_CLINIC_OR_DEPARTMENT_OTHER): Payer: Self-pay

## 2024-09-26 MED ORDER — FENOFIBRATE 160 MG PO TABS: 160.0000 mg | ORAL_TABLET | Freq: Every day | ORAL | 3 refills | Status: AC

## 2024-09-26 MED ORDER — GABAPENTIN 300 MG PO CAPS
300.0000 mg | ORAL_CAPSULE | Freq: Four times a day (QID) | ORAL | 5 refills | Status: AC
  Filled 2024-09-26: qty 120, 30d supply, fill #0

## 2024-10-09 ENCOUNTER — Ambulatory Visit

## 2024-12-09 ENCOUNTER — Ambulatory Visit: Admitting: Internal Medicine
